# Patient Record
Sex: Male | Born: 1940 | Race: White | Hispanic: No | Smoking: Former smoker
Health system: Southern US, Community
[De-identification: ages and names within clinical notes are randomized; demographics above are authoritative.]

## PROBLEM LIST (undated history)

## (undated) DIAGNOSIS — E46 Unspecified protein-calorie malnutrition: Secondary | ICD-10-CM

## (undated) DIAGNOSIS — N183 Chronic kidney disease, stage 3 unspecified: Secondary | ICD-10-CM

## (undated) DIAGNOSIS — I5032 Chronic diastolic (congestive) heart failure: Secondary | ICD-10-CM

## (undated) DIAGNOSIS — K264 Chronic or unspecified duodenal ulcer with hemorrhage: Secondary | ICD-10-CM

## (undated) DIAGNOSIS — E119 Type 2 diabetes mellitus without complications: Secondary | ICD-10-CM

## (undated) DIAGNOSIS — K921 Melena: Secondary | ICD-10-CM

## (undated) DIAGNOSIS — I959 Hypotension, unspecified: Secondary | ICD-10-CM

## (undated) DIAGNOSIS — Z8719 Personal history of other diseases of the digestive system: Secondary | ICD-10-CM

## (undated) DIAGNOSIS — E8809 Other disorders of plasma-protein metabolism, not elsewhere classified: Secondary | ICD-10-CM

## (undated) DIAGNOSIS — E785 Hyperlipidemia, unspecified: Secondary | ICD-10-CM

## (undated) DIAGNOSIS — D72829 Elevated white blood cell count, unspecified: Secondary | ICD-10-CM

## (undated) DIAGNOSIS — D62 Acute posthemorrhagic anemia: Secondary | ICD-10-CM

## (undated) DIAGNOSIS — I10 Essential (primary) hypertension: Secondary | ICD-10-CM

## (undated) HISTORY — DX: Unspecified protein-calorie malnutrition: E46

## (undated) HISTORY — DX: Personal history of other diseases of the digestive system: Z87.19

## (undated) HISTORY — DX: Chronic kidney disease, stage 3 unspecified: N18.30

## (undated) HISTORY — DX: Elevated white blood cell count, unspecified: D72.829

## (undated) HISTORY — DX: Chronic or unspecified duodenal ulcer with hemorrhage: K26.4

## (undated) HISTORY — DX: Other disorders of plasma-protein metabolism, not elsewhere classified: E88.09

## (undated) HISTORY — DX: Acute posthemorrhagic anemia: D62

## (undated) HISTORY — DX: Melena: K92.1

## (undated) HISTORY — DX: Hypotension, unspecified: I95.9

## (undated) HISTORY — DX: Chronic diastolic (congestive) heart failure: I50.32

---

## 2014-08-23 DIAGNOSIS — J029 Acute pharyngitis, unspecified: Secondary | ICD-10-CM | POA: Diagnosis not present

## 2014-09-28 DIAGNOSIS — N400 Enlarged prostate without lower urinary tract symptoms: Secondary | ICD-10-CM | POA: Diagnosis not present

## 2014-09-28 DIAGNOSIS — E785 Hyperlipidemia, unspecified: Secondary | ICD-10-CM | POA: Diagnosis not present

## 2014-09-28 DIAGNOSIS — I10 Essential (primary) hypertension: Secondary | ICD-10-CM | POA: Diagnosis not present

## 2014-09-28 DIAGNOSIS — E119 Type 2 diabetes mellitus without complications: Secondary | ICD-10-CM | POA: Diagnosis not present

## 2014-12-27 DIAGNOSIS — H4011X1 Primary open-angle glaucoma, mild stage: Secondary | ICD-10-CM | POA: Diagnosis not present

## 2014-12-28 DIAGNOSIS — E119 Type 2 diabetes mellitus without complications: Secondary | ICD-10-CM | POA: Diagnosis not present

## 2014-12-28 DIAGNOSIS — Z6837 Body mass index (BMI) 37.0-37.9, adult: Secondary | ICD-10-CM | POA: Diagnosis not present

## 2014-12-28 DIAGNOSIS — I10 Essential (primary) hypertension: Secondary | ICD-10-CM | POA: Diagnosis not present

## 2014-12-28 DIAGNOSIS — E785 Hyperlipidemia, unspecified: Secondary | ICD-10-CM | POA: Diagnosis not present

## 2017-09-24 DIAGNOSIS — H401131 Primary open-angle glaucoma, bilateral, mild stage: Secondary | ICD-10-CM | POA: Diagnosis not present

## 2017-09-24 DIAGNOSIS — E119 Type 2 diabetes mellitus without complications: Secondary | ICD-10-CM | POA: Diagnosis not present

## 2017-09-24 DIAGNOSIS — H524 Presbyopia: Secondary | ICD-10-CM | POA: Diagnosis not present

## 2017-12-17 DIAGNOSIS — N183 Chronic kidney disease, stage 3 (moderate): Secondary | ICD-10-CM | POA: Diagnosis not present

## 2017-12-17 DIAGNOSIS — E1121 Type 2 diabetes mellitus with diabetic nephropathy: Secondary | ICD-10-CM | POA: Diagnosis not present

## 2017-12-17 DIAGNOSIS — M19011 Primary osteoarthritis, right shoulder: Secondary | ICD-10-CM | POA: Diagnosis not present

## 2017-12-17 DIAGNOSIS — I10 Essential (primary) hypertension: Secondary | ICD-10-CM | POA: Diagnosis not present

## 2017-12-17 DIAGNOSIS — E785 Hyperlipidemia, unspecified: Secondary | ICD-10-CM | POA: Diagnosis not present

## 2018-03-18 DIAGNOSIS — I10 Essential (primary) hypertension: Secondary | ICD-10-CM | POA: Diagnosis not present

## 2018-03-18 DIAGNOSIS — R609 Edema, unspecified: Secondary | ICD-10-CM | POA: Diagnosis not present

## 2018-03-18 DIAGNOSIS — E1121 Type 2 diabetes mellitus with diabetic nephropathy: Secondary | ICD-10-CM | POA: Diagnosis not present

## 2018-03-18 DIAGNOSIS — Z79899 Other long term (current) drug therapy: Secondary | ICD-10-CM | POA: Diagnosis not present

## 2018-03-18 DIAGNOSIS — E559 Vitamin D deficiency, unspecified: Secondary | ICD-10-CM | POA: Diagnosis not present

## 2018-03-18 DIAGNOSIS — Z1339 Encounter for screening examination for other mental health and behavioral disorders: Secondary | ICD-10-CM | POA: Diagnosis not present

## 2018-03-20 ENCOUNTER — Other Ambulatory Visit: Payer: Self-pay

## 2018-03-20 NOTE — Patient Outreach (Signed)
[facility]) Care Management  03/20/2018  Erik Morales [birth year] [id number]   Medication Adherence call to Erik Morales patients telephone number is disconnected under [facility] and Epic patient is showing past due on Pravastain 20 mg.Erik Morales is showing past due under [place].    [facility] Direct Dial [phone]  Fax [phone] [email or web address]

## 2018-03-25 DIAGNOSIS — H401131 Primary open-angle glaucoma, bilateral, mild stage: Secondary | ICD-10-CM | POA: Diagnosis not present

## 2018-06-25 DIAGNOSIS — I10 Essential (primary) hypertension: Secondary | ICD-10-CM | POA: Diagnosis not present

## 2018-06-25 DIAGNOSIS — Z2821 Immunization not carried out because of patient refusal: Secondary | ICD-10-CM | POA: Diagnosis not present

## 2018-06-25 DIAGNOSIS — Z139 Encounter for screening, unspecified: Secondary | ICD-10-CM | POA: Diagnosis not present

## 2018-06-25 DIAGNOSIS — E785 Hyperlipidemia, unspecified: Secondary | ICD-10-CM | POA: Diagnosis not present

## 2018-09-22 DIAGNOSIS — R946 Abnormal results of thyroid function studies: Secondary | ICD-10-CM | POA: Diagnosis not present

## 2018-09-22 DIAGNOSIS — E119 Type 2 diabetes mellitus without complications: Secondary | ICD-10-CM | POA: Diagnosis not present

## 2018-09-22 DIAGNOSIS — Z79899 Other long term (current) drug therapy: Secondary | ICD-10-CM | POA: Diagnosis not present

## 2018-09-22 DIAGNOSIS — R7989 Other specified abnormal findings of blood chemistry: Secondary | ICD-10-CM | POA: Diagnosis not present

## 2018-09-22 DIAGNOSIS — E11649 Type 2 diabetes mellitus with hypoglycemia without coma: Secondary | ICD-10-CM | POA: Diagnosis not present

## 2018-09-22 DIAGNOSIS — Z7984 Long term (current) use of oral hypoglycemic drugs: Secondary | ICD-10-CM | POA: Diagnosis not present

## 2018-09-22 DIAGNOSIS — T68XXXA Hypothermia, initial encounter: Secondary | ICD-10-CM | POA: Diagnosis not present

## 2018-09-22 DIAGNOSIS — E872 Acidosis: Secondary | ICD-10-CM | POA: Diagnosis not present

## 2018-09-22 DIAGNOSIS — Z87891 Personal history of nicotine dependence: Secondary | ICD-10-CM | POA: Diagnosis not present

## 2018-09-22 DIAGNOSIS — E8770 Fluid overload, unspecified: Secondary | ICD-10-CM | POA: Diagnosis not present

## 2018-09-22 DIAGNOSIS — R410 Disorientation, unspecified: Secondary | ICD-10-CM | POA: Diagnosis not present

## 2018-09-22 DIAGNOSIS — I4891 Unspecified atrial fibrillation: Secondary | ICD-10-CM | POA: Diagnosis not present

## 2018-09-22 DIAGNOSIS — Z9181 History of falling: Secondary | ICD-10-CM | POA: Diagnosis not present

## 2018-09-22 DIAGNOSIS — E041 Nontoxic single thyroid nodule: Secondary | ICD-10-CM | POA: Diagnosis not present

## 2018-09-22 DIAGNOSIS — E0590 Thyrotoxicosis, unspecified without thyrotoxic crisis or storm: Secondary | ICD-10-CM | POA: Diagnosis not present

## 2018-09-22 DIAGNOSIS — K921 Melena: Secondary | ICD-10-CM | POA: Diagnosis not present

## 2018-09-22 DIAGNOSIS — E875 Hyperkalemia: Secondary | ICD-10-CM | POA: Diagnosis not present

## 2018-09-22 DIAGNOSIS — I214 Non-ST elevation (NSTEMI) myocardial infarction: Secondary | ICD-10-CM | POA: Diagnosis not present

## 2018-09-22 DIAGNOSIS — K449 Diaphragmatic hernia without obstruction or gangrene: Secondary | ICD-10-CM | POA: Diagnosis not present

## 2018-09-22 DIAGNOSIS — B9620 Unspecified Escherichia coli [E. coli] as the cause of diseases classified elsewhere: Secondary | ICD-10-CM | POA: Diagnosis not present

## 2018-09-22 DIAGNOSIS — I4892 Unspecified atrial flutter: Secondary | ICD-10-CM | POA: Diagnosis not present

## 2018-09-22 DIAGNOSIS — N390 Urinary tract infection, site not specified: Secondary | ICD-10-CM | POA: Diagnosis not present

## 2018-09-22 DIAGNOSIS — N179 Acute kidney failure, unspecified: Secondary | ICD-10-CM | POA: Diagnosis not present

## 2018-09-22 DIAGNOSIS — A419 Sepsis, unspecified organism: Secondary | ICD-10-CM | POA: Diagnosis not present

## 2018-09-22 DIAGNOSIS — R0602 Shortness of breath: Secondary | ICD-10-CM | POA: Diagnosis not present

## 2018-09-22 DIAGNOSIS — R6520 Severe sepsis without septic shock: Secondary | ICD-10-CM | POA: Diagnosis not present

## 2018-09-22 DIAGNOSIS — K922 Gastrointestinal hemorrhage, unspecified: Secondary | ICD-10-CM | POA: Diagnosis not present

## 2018-09-22 DIAGNOSIS — I959 Hypotension, unspecified: Secondary | ICD-10-CM | POA: Diagnosis not present

## 2018-09-22 DIAGNOSIS — I10 Essential (primary) hypertension: Secondary | ICD-10-CM | POA: Diagnosis not present

## 2018-09-22 DIAGNOSIS — J189 Pneumonia, unspecified organism: Secondary | ICD-10-CM | POA: Diagnosis not present

## 2018-09-22 DIAGNOSIS — G92 Toxic encephalopathy: Secondary | ICD-10-CM | POA: Diagnosis not present

## 2018-09-22 DIAGNOSIS — E785 Hyperlipidemia, unspecified: Secondary | ICD-10-CM | POA: Diagnosis not present

## 2018-09-23 DIAGNOSIS — R7989 Other specified abnormal findings of blood chemistry: Secondary | ICD-10-CM

## 2018-09-23 DIAGNOSIS — I4891 Unspecified atrial fibrillation: Secondary | ICD-10-CM

## 2018-09-25 ENCOUNTER — Inpatient Hospital Stay (HOSPITAL_COMMUNITY)
Admission: AD | Admit: 2018-09-25 | Discharge: 2018-10-02 | DRG: 377 | Disposition: A | Discharge status: Rehabilitation Facility | Payer: Medicare Other | Source: Other Acute Inpatient Hospital | Attending: Internal Medicine | Admitting: Internal Medicine

## 2018-09-25 ENCOUNTER — Encounter (HOSPITAL_COMMUNITY): Payer: Self-pay | Admitting: Internal Medicine

## 2018-09-25 DIAGNOSIS — K264 Chronic or unspecified duodenal ulcer with hemorrhage: Principal | ICD-10-CM

## 2018-09-25 DIAGNOSIS — R946 Abnormal results of thyroid function studies: Secondary | ICD-10-CM

## 2018-09-25 DIAGNOSIS — N390 Urinary tract infection, site not specified: Secondary | ICD-10-CM

## 2018-09-25 DIAGNOSIS — N179 Acute kidney failure, unspecified: Secondary | ICD-10-CM

## 2018-09-25 DIAGNOSIS — E46 Unspecified protein-calorie malnutrition: Secondary | ICD-10-CM | POA: Diagnosis not present

## 2018-09-25 DIAGNOSIS — R7989 Other specified abnormal findings of blood chemistry: Secondary | ICD-10-CM | POA: Diagnosis present

## 2018-09-25 DIAGNOSIS — I10 Essential (primary) hypertension: Secondary | ICD-10-CM | POA: Diagnosis not present

## 2018-09-25 DIAGNOSIS — G3184 Mild cognitive impairment, so stated: Secondary | ICD-10-CM | POA: Diagnosis not present

## 2018-09-25 DIAGNOSIS — A419 Sepsis, unspecified organism: Secondary | ICD-10-CM | POA: Diagnosis present

## 2018-09-25 DIAGNOSIS — K921 Melena: Secondary | ICD-10-CM

## 2018-09-25 DIAGNOSIS — L8990 Pressure ulcer of unspecified site, unspecified stage: Secondary | ICD-10-CM | POA: Diagnosis present

## 2018-09-25 DIAGNOSIS — A4151 Sepsis due to Escherichia coli [E. coli]: Secondary | ICD-10-CM | POA: Diagnosis not present

## 2018-09-25 DIAGNOSIS — E669 Obesity, unspecified: Secondary | ICD-10-CM | POA: Diagnosis present

## 2018-09-25 DIAGNOSIS — J181 Lobar pneumonia, unspecified organism: Secondary | ICD-10-CM | POA: Diagnosis present

## 2018-09-25 DIAGNOSIS — Z6835 Body mass index (BMI) 35.0-35.9, adult: Secondary | ICD-10-CM | POA: Diagnosis not present

## 2018-09-25 DIAGNOSIS — Z833 Family history of diabetes mellitus: Secondary | ICD-10-CM | POA: Diagnosis not present

## 2018-09-25 DIAGNOSIS — B9620 Unspecified Escherichia coli [E. coli] as the cause of diseases classified elsewhere: Secondary | ICD-10-CM

## 2018-09-25 DIAGNOSIS — E1169 Type 2 diabetes mellitus with other specified complication: Secondary | ICD-10-CM | POA: Diagnosis not present

## 2018-09-25 DIAGNOSIS — I480 Paroxysmal atrial fibrillation: Secondary | ICD-10-CM | POA: Diagnosis not present

## 2018-09-25 DIAGNOSIS — Z87891 Personal history of nicotine dependence: Secondary | ICD-10-CM | POA: Diagnosis not present

## 2018-09-25 DIAGNOSIS — I5032 Chronic diastolic (congestive) heart failure: Secondary | ICD-10-CM | POA: Diagnosis present

## 2018-09-25 DIAGNOSIS — I248 Other forms of acute ischemic heart disease: Secondary | ICD-10-CM | POA: Diagnosis present

## 2018-09-25 DIAGNOSIS — E119 Type 2 diabetes mellitus without complications: Secondary | ICD-10-CM

## 2018-09-25 DIAGNOSIS — I4891 Unspecified atrial fibrillation: Secondary | ICD-10-CM | POA: Diagnosis not present

## 2018-09-25 DIAGNOSIS — Z79899 Other long term (current) drug therapy: Secondary | ICD-10-CM | POA: Diagnosis not present

## 2018-09-25 DIAGNOSIS — Z7984 Long term (current) use of oral hypoglycemic drugs: Secondary | ICD-10-CM

## 2018-09-25 DIAGNOSIS — K222 Esophageal obstruction: Secondary | ICD-10-CM | POA: Diagnosis present

## 2018-09-25 DIAGNOSIS — R5381 Other malaise: Secondary | ICD-10-CM | POA: Diagnosis not present

## 2018-09-25 DIAGNOSIS — I214 Non-ST elevation (NSTEMI) myocardial infarction: Secondary | ICD-10-CM | POA: Diagnosis not present

## 2018-09-25 DIAGNOSIS — R6520 Severe sepsis without septic shock: Secondary | ICD-10-CM

## 2018-09-25 DIAGNOSIS — K449 Diaphragmatic hernia without obstruction or gangrene: Secondary | ICD-10-CM | POA: Diagnosis not present

## 2018-09-25 DIAGNOSIS — I110 Hypertensive heart disease with heart failure: Secondary | ICD-10-CM | POA: Diagnosis present

## 2018-09-25 DIAGNOSIS — D509 Iron deficiency anemia, unspecified: Secondary | ICD-10-CM | POA: Diagnosis not present

## 2018-09-25 DIAGNOSIS — E1122 Type 2 diabetes mellitus with diabetic chronic kidney disease: Secondary | ICD-10-CM | POA: Diagnosis not present

## 2018-09-25 DIAGNOSIS — D62 Acute posthemorrhagic anemia: Secondary | ICD-10-CM

## 2018-09-25 DIAGNOSIS — E8809 Other disorders of plasma-protein metabolism, not elsewhere classified: Secondary | ICD-10-CM | POA: Diagnosis not present

## 2018-09-25 DIAGNOSIS — J189 Pneumonia, unspecified organism: Secondary | ICD-10-CM

## 2018-09-25 DIAGNOSIS — I4892 Unspecified atrial flutter: Secondary | ICD-10-CM | POA: Diagnosis not present

## 2018-09-25 DIAGNOSIS — D72829 Elevated white blood cell count, unspecified: Secondary | ICD-10-CM

## 2018-09-25 DIAGNOSIS — G92 Toxic encephalopathy: Secondary | ICD-10-CM | POA: Diagnosis not present

## 2018-09-25 DIAGNOSIS — R778 Other specified abnormalities of plasma proteins: Secondary | ICD-10-CM

## 2018-09-25 DIAGNOSIS — I130 Hypertensive heart and chronic kidney disease with heart failure and stage 1 through stage 4 chronic kidney disease, or unspecified chronic kidney disease: Secondary | ICD-10-CM | POA: Diagnosis not present

## 2018-09-25 DIAGNOSIS — Z720 Tobacco use: Secondary | ICD-10-CM

## 2018-09-25 DIAGNOSIS — R7309 Other abnormal glucose: Secondary | ICD-10-CM | POA: Diagnosis not present

## 2018-09-25 DIAGNOSIS — Z8719 Personal history of other diseases of the digestive system: Secondary | ICD-10-CM | POA: Diagnosis not present

## 2018-09-25 DIAGNOSIS — I959 Hypotension, unspecified: Secondary | ICD-10-CM | POA: Diagnosis not present

## 2018-09-25 DIAGNOSIS — E785 Hyperlipidemia, unspecified: Secondary | ICD-10-CM | POA: Diagnosis present

## 2018-09-25 DIAGNOSIS — N183 Chronic kidney disease, stage 3 (moderate): Secondary | ICD-10-CM | POA: Diagnosis not present

## 2018-09-25 DIAGNOSIS — Z09 Encounter for follow-up examination after completed treatment for conditions other than malignant neoplasm: Secondary | ICD-10-CM | POA: Diagnosis not present

## 2018-09-25 HISTORY — DX: Type 2 diabetes mellitus without complications: E11.9

## 2018-09-25 HISTORY — DX: Sepsis, unspecified organism: A41.9

## 2018-09-25 HISTORY — DX: Other specified abnormal findings of blood chemistry: R79.89

## 2018-09-25 HISTORY — DX: Severe sepsis without septic shock: R65.20

## 2018-09-25 HISTORY — DX: Melena: K92.1

## 2018-09-25 HISTORY — DX: Urinary tract infection, site not specified: B96.20

## 2018-09-25 HISTORY — DX: Essential (primary) hypertension: I10

## 2018-09-25 HISTORY — DX: Pneumonia, unspecified organism: J18.9

## 2018-09-25 HISTORY — DX: Acute kidney failure, unspecified: N17.9

## 2018-09-25 HISTORY — DX: Unspecified Escherichia coli (E. coli) as the cause of diseases classified elsewhere: N39.0

## 2018-09-25 HISTORY — DX: Hyperlipidemia, unspecified: E78.5

## 2018-09-25 HISTORY — DX: Abnormal results of thyroid function studies: R94.6

## 2018-09-25 HISTORY — DX: Other specified abnormalities of plasma proteins: R77.8

## 2018-09-25 LAB — APTT: aPTT: 35 seconds (ref 24–36)

## 2018-09-25 LAB — COMPREHENSIVE METABOLIC PANEL
ALT: 25 U/L (ref 0–44)
AST: 56 U/L — ABNORMAL HIGH (ref 15–41)
Albumin: 1.9 g/dL — ABNORMAL LOW (ref 3.5–5.0)
Alkaline Phosphatase: 35 U/L — ABNORMAL LOW (ref 38–126)
Anion gap: 7 (ref 5–15)
BUN: 56 mg/dL — ABNORMAL HIGH (ref 8–23)
CO2: 24 mmol/L (ref 22–32)
Calcium: 7.5 mg/dL — ABNORMAL LOW (ref 8.9–10.3)
Chloride: 111 mmol/L (ref 98–111)
Creatinine, Ser: 1.86 mg/dL — ABNORMAL HIGH (ref 0.61–1.24)
GFR calc non Af Amer: 34 mL/min — ABNORMAL LOW (ref >60)
GFR, EST AFRICAN AMERICAN: 39 mL/min — AB (ref >60)
Glucose, Bld: 247 mg/dL — ABNORMAL HIGH (ref 70–99)
POTASSIUM: 4.2 mmol/L (ref 3.5–5.1)
Sodium: 142 mmol/L (ref 135–145)
Total Bilirubin: 0.4 mg/dL (ref 0.3–1.2)
Total Protein: 4.5 g/dL — ABNORMAL LOW (ref 6.5–8.1)

## 2018-09-25 LAB — CBC
HCT: 25.4 % — ABNORMAL LOW (ref 39.0–52.0)
Hemoglobin: 8.2 g/dL — ABNORMAL LOW (ref 13.0–17.0)
MCH: 28.3 pg (ref 26.0–34.0)
MCHC: 32.3 g/dL (ref 30.0–36.0)
MCV: 87.6 fL (ref 80.0–100.0)
Platelets: 195 10*3/uL (ref 150–400)
RBC: 2.9 MIL/uL — ABNORMAL LOW (ref 4.22–5.81)
RDW: 14.4 % (ref 11.5–15.5)
WBC: 24.8 10*3/uL — ABNORMAL HIGH (ref 4.0–10.5)
nRBC: 1 % — ABNORMAL HIGH (ref 0.0–0.2)

## 2018-09-25 LAB — PROTIME-INR
INR: 1.42
Prothrombin Time: 17.2 seconds — ABNORMAL HIGH (ref 11.4–15.2)

## 2018-09-25 LAB — TROPONIN I: Troponin I: 15.74 ng/mL (ref <0.03)

## 2018-09-25 MED ORDER — ONDANSETRON HCL 4 MG/2ML IJ SOLN: 4.0000 mg | Freq: Four times a day (QID) | INTRAMUSCULAR | Status: DC | PRN

## 2018-09-25 MED ORDER — SODIUM CHLORIDE 0.9 % IV SOLN
8.0000 mg/h | INTRAVENOUS | Status: DC
  Administered 2018-09-26 – 2018-09-28 (×6): 8 mg/h via INTRAVENOUS
  Filled 2018-09-25 (×8): qty 80

## 2018-09-25 MED ORDER — SODIUM CHLORIDE 0.9 % IV SOLN
500.0000 mg | INTRAVENOUS | Status: DC
  Administered 2018-09-26 – 2018-09-27 (×3): 500 mg via INTRAVENOUS
  Filled 2018-09-25 (×4): qty 500

## 2018-09-25 MED ORDER — ACETAMINOPHEN 325 MG PO TABS: 650.0000 mg | ORAL_TABLET | Freq: Four times a day (QID) | ORAL | Status: DC | PRN

## 2018-09-25 MED ORDER — SODIUM CHLORIDE 0.9 % IV SOLN
2.0000 g | INTRAVENOUS | Status: DC
  Administered 2018-09-26 – 2018-09-27 (×3): 2 g via INTRAVENOUS
  Filled 2018-09-25 (×4): qty 20

## 2018-09-25 MED ORDER — ONDANSETRON HCL 4 MG PO TABS: 4.0000 mg | ORAL_TABLET | Freq: Four times a day (QID) | ORAL | Status: DC | PRN

## 2018-09-25 MED ORDER — AMIODARONE HCL IN DEXTROSE 360-4.14 MG/200ML-% IV SOLN
30.0000 mg/h | INTRAVENOUS | Status: DC
  Administered 2018-09-26 – 2018-09-28 (×5): 30 mg/h via INTRAVENOUS
  Filled 2018-09-25 (×7): qty 200

## 2018-09-25 MED ORDER — ACETAMINOPHEN 650 MG RE SUPP: 650.0000 mg | Freq: Four times a day (QID) | RECTAL | Status: DC | PRN

## 2018-09-25 MED ORDER — INSULIN ASPART 100 UNIT/ML ~~LOC~~ SOLN
0.0000 [IU] | SUBCUTANEOUS | Status: DC
  Administered 2018-09-26 (×2): 2 [IU] via SUBCUTANEOUS
  Administered 2018-09-26: 3 [IU] via SUBCUTANEOUS
  Administered 2018-09-26: 5 [IU] via SUBCUTANEOUS
  Administered 2018-09-26 (×2): 2 [IU] via SUBCUTANEOUS
  Administered 2018-09-27: 1 [IU] via SUBCUTANEOUS
  Administered 2018-09-27 (×4): 2 [IU] via SUBCUTANEOUS
  Administered 2018-09-28: 1 [IU] via SUBCUTANEOUS
  Administered 2018-09-28: 2 [IU] via SUBCUTANEOUS
  Administered 2018-09-28 (×2): 1 [IU] via SUBCUTANEOUS
  Administered 2018-09-29: 2 [IU] via SUBCUTANEOUS
  Administered 2018-09-29: 1 [IU] via SUBCUTANEOUS
  Administered 2018-09-30: 2 [IU] via SUBCUTANEOUS
  Administered 2018-09-30 – 2018-10-02 (×5): 1 [IU] via SUBCUTANEOUS

## 2018-09-25 MED ORDER — PANTOPRAZOLE SODIUM 40 MG IV SOLR: 40.0000 mg | Freq: Two times a day (BID) | INTRAVENOUS | Status: DC

## 2018-09-25 NOTE — H&P (Signed)
History and Physical    Erik Morales TDV:[id number] DOB: 1941-03-20 DOA: 09/25/2018  PCP: Patient, No Pcp Per  Patient coming from: [place] transfer  I have personally briefly reviewed patient's old medical records in [place]  Chief Complaint: Hematochezia  HPI: Erik Morales is a 78 y.o. male with medical history significant of diabetes, not on insulin, hypertension on Lasix and ACE inhibitor, presents to the ER for altered mental status. Apparently patient was found in the ditch by the wife, has been there for unknown period of time and sent to the ER. Patient reports that " his riding lawnmower was in the ditch and he called the sheriff department to help him get it out", but he states he was not acting right so they brought him to the ER. Patient complains of mild dysuria, at time of admission denies any nausea, vomiting, chest pain, fever, chills, headache, numbness tingling, focal weakness, hematemesis, hematochezia.   In the ER, initial temperature 92.7, blood pressure in low 100, on room air saturating well. Blood workup showed elevated creatinine, leukocytosis, lactic acidosis, . chest x-ray was unremarkable. Urinalysis showed WBC 10-20 in with negative nitrite, negative leukocyte esterase, with hyaline cast, negative protein.Patient was given 2 L normal saline bolus, ceftriaxone. His CT head was unremarkable.  Hospitalist was consulted to admit for further management for his acute kidney injury, severe sepsis.  Subsequent patient was admitted for altered mental status secondary to sepsis, source of sepsis thought to be secondary to urine tract infection/ possible pneumonia. Patient was aggressively treated with IV fluids IV antibiotics (2 g Rocephin and 500 mg azithromycin q.day) Blood cultures has been negative to date, urine culture revealed E coli, pansensitive.   From the infection standpoint patient continued to improve.  Patient was noted to have elevated troponins,  Cardiology was consulted, feeling was that this represented demand ischemia, no significant findings on 2D echocardiogram. Patient has also developed new onset AFib with RVR.  Due to A.Fib and NSTEMI, patient was started on heparin, this was subsequently switched to p.o. Eliquis earlier this morning of 09/25/2018.  Later today 09/25/2018 he had a large bloody bowel movement. Hemoglobin recheck showed that it had droped from 8.12 >> 5.7, patient was hypotensive with a blood pressure of 84/63. Stat 2 unit blood transfusion was ordered, along with IV fluid resuscitation.   All blood thinners were DC, patient started on Protonix, restarted gentle IV fluid hydration. Patient has been kept NPO  Since no GI coverage at [facility], called [facility] at [facility], they have agreed to see the patient.   Patient was transferred to [facility].  Since arrival has had only 1 small bloody BM, maroon colored stool.  Patient states he feels "okay" at the moment.  Denies abd pain, chest pain, SOB, nausea.  Review of Systems: As per HPI otherwise 10 point review of systems negative.   Past Medical History:  Diagnosis Date  . DM2 (diabetes mellitus, type 2) ([place])   . HLD (hyperlipidemia)   . HTN (hypertension)     History reviewed. No pertinent surgical history.   reports that he has quit smoking. He does not have any smokeless tobacco history on file. He reports that he does not drink alcohol or use drugs.  No Known Allergies  No family history on file. Patient denies FHx of GIB, stomach ulcers, or colon cancer, again though not oriented to time  Prior to Admission medications   Not on File    Physical Exam:  Vitals:   09/25/18 2132 09/25/18 2139  BP:  108/70  Pulse:  95  Temp:  98.7 F (37.1 C)  TempSrc:  Oral  SpO2:  99%  Height: 5\' 10"  (1.778 m)     Constitutional: NAD, calm, comfortable, Pale appearing Eyes: PERRL, lids and conjunctivae normal ENMT: Mucous membranes are moist.  Posterior pharynx clear of any exudate or lesions.Normal dentition.  Neck: normal, supple, no masses, no thyromegaly Respiratory: clear to auscultation bilaterally, no wheezing, no crackles. Normal respiratory effort. No accessory muscle use.  Cardiovascular: Regular rate and rhythm, no murmurs / rubs / gallops. No extremity edema. 2+ pedal pulses. No carotid bruits.  Abdomen: no tenderness, no masses palpated. No hepatosplenomegaly. Bowel sounds positive.  Musculoskeletal: no clubbing / cyanosis. No joint deformity upper and lower extremities. Good ROM, no contractures. Normal muscle tone.  Skin: no rashes, lesions, ulcers. No induration Neurologic: CN 2-12 grossly intact. Sensation intact, DTR normal. Strength 5/5 in all 4.  Psychiatric: Pleasant, but confused, "Ill be 78 years old in April".  Oriented to self, and that he was born in 1942, but timing is a bit off.   Labs on Admission: I have personally reviewed following labs and imaging studies  CBC: No results for input(s): WBC, NEUTROABS, HGB, HCT, MCV, PLT in the last 168 hours. Basic Metabolic Panel: No results for input(s): NA, K, CL, CO2, GLUCOSE, BUN, CREATININE, CALCIUM, MG, PHOS in the last 168 hours. GFR: CrCl cannot be calculated (No successful lab value found.). Liver Function Tests: No results for input(s): AST, ALT, ALKPHOS, BILITOT, PROT, ALBUMIN in the last 168 hours. No results for input(s): LIPASE, AMYLASE in the last 168 hours. No results for input(s): AMMONIA in the last 168 hours. Coagulation Profile: No results for input(s): INR, PROTIME in the last 168 hours. Cardiac Enzymes: No results for input(s): CKTOTAL, CKMB, CKMBINDEX, TROPONINI in the last 168 hours. BNP (last 3 results) No results for input(s): PROBNP in the last 8760 hours. HbA1C: No results for input(s): HGBA1C in the last 72 hours. CBG: No results for input(s): GLUCAP in the last 168 hours. Lipid Profile: No results for input(s): CHOL, HDL,  LDLCALC, TRIG, CHOLHDL, LDLDIRECT in the last 72 hours. Thyroid Function Tests: No results for input(s): TSH, T4TOTAL, FREET4, T3FREE, THYROIDAB in the last 72 hours. Anemia Panel: No results for input(s): VITAMINB12, FOLATE, FERRITIN, TIBC, IRON, RETICCTPCT in the last 72 hours. Urine analysis: No results found for: COLORURINE, APPEARANCEUR, LABSPEC, PHURINE, GLUCOSEU, HGBUR, BILIRUBINUR, KETONESUR, PROTEINUR, UROBILINOGEN, NITRITE, LEUKOCYTESUR  Radiological Exams on Admission: No results found.  EKG: Independently reviewed.  Assessment/Plan Principal Problem:   Hematochezia Active Problems:   AKI (acute kidney injury) ([place])   Severe sepsis (HCC)   Atrial fibrillation with RVR (HCC)   Elevated troponin   Abnormal thyroid function test   DM2 (diabetes mellitus, type 2) (HCC)   HTN (hypertension)   E-coli UTI   CAP (community acquired pneumonia)    1. Hematochezia - diverticular bleed? 1. Repeating CBC now and again at 0500 in AM 2. Call GI in AM, sooner if patient becomes unstable 3. Vitals look okay at the moment 4. Type and screen, further transfusion depending on CBC results 5. Checking INR / Aptt 6. If further major bleeding tonight: 1. Likely would warrant Tagged RBC scan 2. Depending on INR / PTT may warrant KCentra reversal, but it sounds like eliquis was 12h ago now. 7. No N/V, hemoptysis, maroon stool but no melena 8. But will keep patient on  PPI gtt for the moment 2. Severe sepsis - secondary to UTI and CAP 1. UCx grew E.Coli, pan sensitive 2. BCx neg to date 3. Influenza Neg 4. Continue rocephin / azithro for now 5. Patient has been improving from the sepsis standpoint during admission at [facility] 3. A.Fib RVR - 1. Now on Amiodarone gtt, HR 95 at the moment (unsure if still A.Fib or not as hes not yet on monitor). 2. Will continue amiodarone for now 3. Anticoagulation stopped today secondary to acute GI bleed (had been on heparin which was switched to eliquis  earlier today prior to having GI bleed) 4. Elevated troponin - 1. Serial trops 2. Had been trending down at [facility] 3. Call Cards in AM (had been seeing at [facility]) 4. No CP 5. EF on echo was WNL 6. Patient needs lexiscan, eventually after stable 5. NIDDM - 1. Sensitive scale SSI Q4H 2. A1C = 5.9 6. HTN - holding HTN meds as he was hypotensive earlier today, BP remains borderline at 108/70 currently. 7. Slightly elevated T4  DVT prophylaxis: SCDs Code Status: Full Family Communication: No family in room Disposition Plan: Home after admit Consults called: None, call GI and Cards in AM Admission status: Admit to inpatient  Severity of Illness: The appropriate patient status for this patient is INPATIENT. Inpatient status is judged to be reasonable and necessary in order to provide the required intensity of service to ensure the patient's safety. The patient's presenting symptoms, physical exam findings, and initial radiographic and laboratory data in the context of their chronic comorbidities is felt to place them at high risk for further clinical deterioration. Furthermore, it is not anticipated that the patient will be medically stable for discharge from the hospital within 2 midnights of admission. The following factors support the patient status of inpatient.   " The patient's presenting symptoms include Hematochezia severe HGB drop, Elevated troponin, pneumonia, UTI, AMS with delirium, see note above.   * I certify that at the point of admission it is my clinical judgment that the patient will require inpatient hospital care spanning beyond 2 midnights from the point of admission due to high intensity of service, high risk for further deterioration and high frequency of surveillance required.*    Ifrah Vest M. DO [facility]  How to contact the [facility] Attending or Consulting provider [place] or covering provider during after hours [place], for this patient?  1. Check the care team  in [facility] and look for a) attending/consulting TRH provider listed and b) the [facility] team listed 2. Log into [email or web address]  Amion Physician Scheduling and messaging for groups and whole hospitals  On call and physician scheduling software for group practices, residents, hospitalists and other medical providers for call, clinic, rotation and shift schedules. OnCall Enterprise is a hospital-wide system for scheduling doctors and paging doctors on call. EasyPlot is for scientific plotting and data analysis.  [email or web address]  and use 's universal password to access. If you do not have the password, please contact the hospital operator.  3. Locate the [facility] provider you are looking for under [facility] and page to a number that you can be directly reached. 4. If you still have difficulty reaching the provider, please page the [facility] (Director on Call) for the Hospitalists listed on amion for assistance.  09/25/2018, 10:23 PM

## 2018-09-25 NOTE — Progress Notes (Signed)
  Amiodarone Drug - Drug Interaction Consult Note  Recommendations: Monitor closely for QT prolongation  Amiodarone is metabolized by the cytochrome P450 system and therefore has the potential to cause many drug interactions. Amiodarone has an average plasma half-life of 50 days (range 20 to 100 days).   There is potential for drug interactions to occur several weeks or months after stopping treatment and the onset of drug interactions may be slow after initiating amiodarone.    [x]  Drugs that prolong the QT interval:  Torsades de pointes risk may be increased with concurrent use - avoid if possible.  Monitor QTc, also keep magnesium/potassium WNL if concurrent therapy can't be avoided. [place] Kitchen Antibiotics: e.g. fluoroquinolones, erythromycin, azithromycin   Pharmacy also asked to continue IV amiodarone from [place]. Pt is currently on 30mg /hr - will enter order for this rate.  Thank You,  Sherlon Handing, PharmD, BCPS Clinical pharmacist  **Pharmacist phone directory can now be found on [email or web address] ([email or web address]).  Listed under [place]. 09/25/2018 10:55 PM

## 2018-09-26 ENCOUNTER — Inpatient Hospital Stay (HOSPITAL_COMMUNITY): Payer: Medicare Other

## 2018-09-26 ENCOUNTER — Ambulatory Visit (HOSPITAL_COMMUNITY): Payer: Medicare Other

## 2018-09-26 ENCOUNTER — Encounter (HOSPITAL_COMMUNITY): Payer: Self-pay | Admitting: Physician Assistant

## 2018-09-26 DIAGNOSIS — N390 Urinary tract infection, site not specified: Secondary | ICD-10-CM

## 2018-09-26 DIAGNOSIS — L8990 Pressure ulcer of unspecified site, unspecified stage: Secondary | ICD-10-CM | POA: Diagnosis present

## 2018-09-26 DIAGNOSIS — B9620 Unspecified Escherichia coli [E. coli] as the cause of diseases classified elsewhere: Secondary | ICD-10-CM

## 2018-09-26 DIAGNOSIS — J189 Pneumonia, unspecified organism: Secondary | ICD-10-CM

## 2018-09-26 DIAGNOSIS — I4891 Unspecified atrial fibrillation: Secondary | ICD-10-CM

## 2018-09-26 HISTORY — DX: Pressure ulcer of unspecified site, unspecified stage: L89.90

## 2018-09-26 LAB — BASIC METABOLIC PANEL
Anion gap: 8 (ref 5–15)
BUN: 63 mg/dL — ABNORMAL HIGH (ref 8–23)
CO2: 22 mmol/L (ref 22–32)
Calcium: 7.6 mg/dL — ABNORMAL LOW (ref 8.9–10.3)
Chloride: 112 mmol/L — ABNORMAL HIGH (ref 98–111)
Creatinine, Ser: 2.04 mg/dL — ABNORMAL HIGH (ref 0.61–1.24)
GFR calc Af Amer: 35 mL/min — ABNORMAL LOW (ref >60)
GFR calc non Af Amer: 30 mL/min — ABNORMAL LOW (ref >60)
Glucose, Bld: 244 mg/dL — ABNORMAL HIGH (ref 70–99)
Potassium: 4.7 mmol/L (ref 3.5–5.1)
Sodium: 142 mmol/L (ref 135–145)

## 2018-09-26 LAB — HEMOGLOBIN AND HEMATOCRIT, BLOOD
HEMATOCRIT: 24.1 % — AB (ref 39.0–52.0)
Hemoglobin: 8.2 g/dL — ABNORMAL LOW (ref 13.0–17.0)

## 2018-09-26 LAB — ABO/RH: ABO/RH(D): A POS

## 2018-09-26 LAB — URINALYSIS, ROUTINE W REFLEX MICROSCOPIC
Bilirubin Urine: NEGATIVE
GLUCOSE, UA: NEGATIVE mg/dL
Ketones, ur: NEGATIVE mg/dL
Leukocytes, UA: NEGATIVE
NITRITE: NEGATIVE
Protein, ur: NEGATIVE mg/dL
Specific Gravity, Urine: 1.015 (ref 1.005–1.030)
pH: 5 (ref 5.0–8.0)

## 2018-09-26 LAB — ECHOCARDIOGRAM COMPLETE
Height: 70 in
WEIGHTICAEL: 3950.64 [oz_av]

## 2018-09-26 LAB — CBC
HEMATOCRIT: 21.4 % — AB (ref 39.0–52.0)
Hemoglobin: 7.1 g/dL — ABNORMAL LOW (ref 13.0–17.0)
MCH: 29.3 pg (ref 26.0–34.0)
MCHC: 33.2 g/dL (ref 30.0–36.0)
MCV: 88.4 fL (ref 80.0–100.0)
Platelets: 211 10*3/uL (ref 150–400)
RBC: 2.42 MIL/uL — ABNORMAL LOW (ref 4.22–5.81)
RDW: 15 % (ref 11.5–15.5)
WBC: 29.5 10*3/uL — ABNORMAL HIGH (ref 4.0–10.5)
nRBC: 1.1 % — ABNORMAL HIGH (ref 0.0–0.2)

## 2018-09-26 LAB — TROPONIN I: Troponin I: 10.23 ng/mL

## 2018-09-26 LAB — TSH: TSH: 0.43 u[IU]/mL (ref 0.350–4.500)

## 2018-09-26 LAB — GLUCOSE, CAPILLARY
GLUCOSE-CAPILLARY: 160 mg/dL — AB (ref 70–99)
Glucose-Capillary: 171 mg/dL — ABNORMAL HIGH (ref 70–99)
Glucose-Capillary: 183 mg/dL — ABNORMAL HIGH (ref 70–99)
Glucose-Capillary: 190 mg/dL — ABNORMAL HIGH (ref 70–99)
Glucose-Capillary: 244 mg/dL — ABNORMAL HIGH (ref 70–99)
Glucose-Capillary: 274 mg/dL — ABNORMAL HIGH (ref 70–99)

## 2018-09-26 LAB — PREPARE RBC (CROSSMATCH)

## 2018-09-26 LAB — MAGNESIUM: MAGNESIUM: 1.8 mg/dL (ref 1.7–2.4)

## 2018-09-26 MED ORDER — SODIUM CHLORIDE 0.9% IV SOLUTION: Freq: Once | INTRAVENOUS | Status: DC

## 2018-09-26 MED ORDER — AMIODARONE LOAD VIA INFUSION
150.0000 mg | Freq: Once | INTRAVENOUS | Status: AC
  Administered 2018-09-26: 150 mg via INTRAVENOUS
  Filled 2018-09-26: qty 83.34

## 2018-09-26 MED ORDER — LACTATED RINGERS IV SOLN
INTRAVENOUS | Status: DC
  Administered 2018-09-26 (×2): via INTRAVENOUS

## 2018-09-26 MED ORDER — LACTATED RINGERS IV SOLN
INTRAVENOUS | Status: AC
  Administered 2018-09-26: 11:00:00 via INTRAVENOUS

## 2018-09-26 MED ORDER — SODIUM CHLORIDE 0.9 % IV SOLN
INTRAVENOUS | Status: DC
  Administered 2018-09-26: 05:00:00 via INTRAVENOUS

## 2018-09-26 MED ORDER — AMIODARONE IV BOLUS ONLY 150 MG/100ML: 150.0000 mg | Freq: Once | INTRAVENOUS | Status: DC

## 2018-09-26 MED ORDER — PROTHROMBIN COMPLEX CONC HUMAN 500 UNITS IV KIT
4269.0000 [IU] | PACK | Status: AC
  Administered 2018-09-26: 4269 [IU] via INTRAVENOUS
  Filled 2018-09-26: qty 4269

## 2018-09-26 MED ORDER — HALOPERIDOL LACTATE 5 MG/ML IJ SOLN
5.0000 mg | Freq: Once | INTRAMUSCULAR | Status: AC
  Administered 2018-09-26: 5 mg via INTRAVENOUS
  Filled 2018-09-26: qty 1

## 2018-09-26 MED ORDER — SODIUM CHLORIDE 0.9 % IV BOLUS
500.0000 mL | Freq: Once | INTRAVENOUS | Status: AC
  Administered 2018-09-26: 500 mL via INTRAVENOUS

## 2018-09-26 MED ORDER — PERFLUTREN LIPID MICROSPHERE
1.0000 mL | INTRAVENOUS | Status: AC | PRN
  Administered 2018-09-26: 9 mL via INTRAVENOUS
  Filled 2018-09-26: qty 10

## 2018-09-26 NOTE — Progress Notes (Signed)
Patient still bleeding with melena in rectal tube.  BP 95/50, HR 105  He is alert, does not have CP, dyspnea, or abdominal pain or tenderness.  Labs including hemoglobin are being drawn now.  I will leave transfusion parameters.  I also spoke with his granddaughter Maddoxx Burkitt about an EGD tomorrow. Patient with altered mental status and cannot give consent.  Home number listed in chart is not in service, and Janett Billow was listed as NOK.  She is agreeable to EGD tomorrow, and will be here early AM to sign consent and be present while Mehtaab has his procedure.  Supportive care meantime.  500 cc saline bolus ordered.  Nursing updated.

## 2018-09-26 NOTE — Progress Notes (Signed)
On assessment to administer blood products patient SBP was 70-80. Cardiology PA Dunn in room and aware. MD Candiss Norse also made aware, goal at this time to keep SBP>90 with fluids and blood.  Patient denies pain, dizziness or shortness of breath at this time. Will continue to monitor closely. Family also at bedside and updated.

## 2018-09-26 NOTE — Consult Note (Addendum)
[facility] Consult Note   History KAISON [place]ARLAND MRN # [id number]  Date of Admission: 09/25/2018 Date of Consultation: 09/26/2018 Referring physician: Dr. Thurnell Lose, MD Primary Care Provider: Patient, No Pcp Per Primary Gastroenterologist: None   Reason for Consultation/Chief Complaint: Hematochezia  Subjective  HPI:  This is a 78 year old man transferred last evening from [facility] due to lack of GI coverage the setting of multiple acute illnesses complicated by GI bleeding. He was admitted to [facility] within the last few days, brought by family when they found him outside with his lawnmower turned over in a ditch for an unknown period of time.  He had altered mental status, was hypothermic and with a sepsis picture.  He has E. coli UTI and reportedly pneumonia as well, on Rocephin and azithromycin.  His sepsis picture was apparently improving, he had NSTEMI with significant elevation of troponin thought to be demand from his acute illness.  He then developed rapid A. fib and was put on amiodarone drip.  He was started on IV heparin and then received a dose of Eliquis about 24 hours ago.  Later that day he had a large bloody bowel movement with a drop of hemoglobin from 8-5.7.  He was hypotensive, received 2 units of PRBCs and IV fluids.  He was then transferred here overnight.  Admission history and physical and progress note from hospitalist have both been reviewed.  I also spoke with the hospitalist Dr. Candiss Norse who is picking him up today.  Early morning labs found the patient's hemoglobin to have dropped back down to 7 after transfusion, he has been ordered for 2 units of PRBCs, blood bank is been contacted to release emergently.  There were also orders placed for a tagged RBC scan.  Patient is confused and cannot give any helpful history or review of systems.  His family is not currently here.  ROS:  Unable to completely obtain as noted above.  While  he is awake and conversational, he is disoriented.  He denies abdominal pain or chest pain or dizziness.   Past Medical History Past Medical History:  Diagnosis Date  . DM2 (diabetes mellitus, type 2) ([place])   . HLD (hyperlipidemia)   . HTN (hypertension)     Past Surgical History History reviewed. No pertinent surgical history. No known surgical history  Family History No family history on file. Can Not obtain mental status Social History Social History   Socioeconomic History  . Marital status: Unknown    Spouse name: Not on file  . Number of children: Not on file  . Years of education: Not on file  . Highest education level: Not on file  Occupational History  . Not on file  Social Needs  . Financial resource strain: Not on file  . Food insecurity:    Worry: Not on file    Inability: Not on file  . Transportation needs:    Medical: Not on file    Non-medical: Not on file  Tobacco Use  . Smoking status: Former Smoker  Substance and Sexual Activity  . Alcohol use: Never    Frequency: Never  . Drug use: Never  . Sexual activity: Not on file  Lifestyle  . Physical activity:    Days per week: Not on file    Minutes per session: Not on file  . Stress: Not on file  Relationships  . Social connections:    Talks on phone: Not on file    Gets  together: Not on file    Attends religious service: Not on file    Active member of club or organization: Not on file    Attends meetings of clubs or organizations: Not on file    Relationship status: Not on file  Other Topics Concern  . Not on file  Social History Narrative  . Not on file   He reportedly lives at home with his wife. Allergies No Known Allergies  Outpatient Meds Home medications from the H+P and/or nursing med reconciliation reviewed.  Inpatient med list reviewed  _____________________________________________________________________ Objective   Exam:  Current vital signs  Patient Vitals for  the past 8 hrs:  BP Pulse Resp  09/26/18 0525 (!) 102/58 93 18    Intake/Output Summary (Last 24 hours) at 09/26/2018 0808 Last data filed at 09/26/2018 0546 Gross per 24 hour  Intake 448.71 ml  Output 500 ml  Net -51.29 ml    Physical Exam:    General: this is a acutely ill patient awake, conversational, slow speech, to get out of bed.    Eyes: sclera anicteric, no redness  ENT: oral mucosa moist without lesions, no cervical or supraclavicular lymphadenopathy  CV: Irregular without appreciable murmur,no JVD,, no peripheral edema  Resp: Rhonchi bilaterally, normal RR and effort noted  GI: soft, no tenderness, with active bowel sounds. No guarding or palpable organomegaly noted  Skin; warm and dry, no rash or jaundice noted, pale  Neuro: awake, alert confused as noted above. normal gross motor function and fluent speech. His fecal collection bag has copious amount of maroon blood  Labs:  Recent Labs  Lab 09/25/18 2236 09/26/18 0522  WBC 24.8* 29.5*  HGB 8.2* 7.1*  HCT 25.4* 21.4*  PLT 195 211   Recent Labs  Lab 09/25/18 2236 09/26/18 0522  NA 142 142  K 4.2 4.7  CL 111 112*  CO2 24 22  BUN 56* 63*  ALBUMIN 1.9*  --   ALKPHOS 35*  --   ALT 25  --   AST 56*  --   GLUCOSE 247* 244*  Creatinine 1.86 last evening, 2.04 this morning.  Baseline renal function unknown  Recent Labs  Lab 09/25/18 2236  INR 1.42   Troponin peaked just over 15 last evening, down to 10 today  Radiologic studies: History and physical notes that the patient's LV ejection fraction was normal on echocardiogram done at [facility].  @ASSESSMENTPLANBEGIN @ Impression:  Hematochezia Acute blood loss anemia Urosepsis with pneumonia New onset atrial fibrillation Acute renal failure, possibly with chronic component. Non-STEMI due to demand from severe blood loss anemia and hypotension Altered mental status  It is not yet clear if this bleeding is from an upper or lower GI  source, but I suspect it is more likely lower.  Plan:  Dr. Candiss Norse and I conferred about the following plan:  2 units of PRBCs now with emergent release of blood from the blood bank Direct reversal agent for his oral anticoagulant will be given Protonix drip was started overnight and will be continued. Every 6 hours hemoglobin and hematocrit after the transfusion is done. Keep patient n.p.o. IV fluid support Cancel tagged RBC scan for now, as the patient needs reversal of anticoagulation and more time for its effect to get out of his system.  He would be high risk for standard angiogram to control bleeding since he has elevated creatinine.  We will contact his family as soon as possible to to discuss goals of care.  He  would be high risk for endoscopic work-up and is not yet ready for that.  He is currently a full code.  High medical complexity.  Thank you for the courtesy of this consult.  Please contact me with any questions or concerns.  Nelida Meuse III Office: [phone]

## 2018-09-26 NOTE — Consult Note (Signed)
[place] [facility] Nurse wound consult note Reason for Consult:Nurse reports small <1cm round area of partial thickness tissue loss in the perianal area.  Patient is incontinent of moderate volume of liquid black/red stools and a fecal incontinence collector (rectal pouch, not internal) was placed within the past 4 hours.  It is currently intact.  The backing on the pouching system is a hydrocolloid and is a wound healing dressing. I will not change at this time. Wound type: Suspected moisture associated skin damage from fecal incontinence Pressure Injury POA: N/A Measurement:As reported by night shift RN, <1cm round x 0.1cm (partial thickness) Wound MBT:DHRC, moist Drainage (amount, consistency, odor) None Periwound:erythematous (blanching) as reported by RN and seemingly associated with fecal incontinence. 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 Dressing procedure/placement/frequency: I have asked Nursing to keep fecal pouch in place as they are able.  They report he will not lie on side and repeatedly tried to touch the rectal area despite stooling. I have provided guidance for the use of our house skin cleanser at it will break down the fecal enzymes and also act as a surfactant to make cleaning up less traumatic on the skin. Bilateral heel boots for pressure injury prevention.  [place] nursing team will not follow, but will remain available to this patient, the nursing and medical teams.  Please re-consult if needed. Thanks, Maudie Flakes, MSN, RN, [place], Arther Abbott  Pager# [phone]

## 2018-09-26 NOTE — Progress Notes (Signed)
  Echocardiogram 2D Echocardiogram has been performed.  Erik Morales 09/26/2018, 3:20 PM

## 2018-09-26 NOTE — Progress Notes (Addendum)
Initial labs back: 1. HGB up to 8.2 from 5.7 at [facility] following 2u PRBC transfusion. 1. 2 episodes of hematochezia overnight thus far 2. Vitals remain stable: HR 90s A.Fib, BP 102/58 3. AM repeat labs just came back, HGB back down to 7.1 overnight from 8.2. 1. Will Transfuse 1u PRBC now 2. Will order tagged RBC scan 2. Trop 15.7 noted 1. Had peaked at 3.5 over at [facility] and was trending back down, but that was before he had major GI bleed yesterday. 2. Patient denies any CP or SOB at this point 3. Getting EKG 4. Calling cards now. 1. Spoke with [facility], they will see patient, of course no immediate plans for cath given GIB 5. Cant really add BP meds due to borderline BPs and hypotension yesterday, cant add heparin or ASA due to active GI bleed. 3. Creat 1.8, had been 3.3 on admission to [facility] before trending down to 1.4 yesterday: worsening today is presumably pre-renal due to GI bleed, hypotension earlier in day yesterday 1. Strict intake and output 2. IVF: NS at 100 ordered for the moment 3. Repeating this morning.

## 2018-09-26 NOTE — Progress Notes (Signed)
PROGRESS NOTE                                                                                                                                                                                                             Patient Demographics:    Erik Morales, is a 78 y.o. male, DOB - 1941-05-27, VHQ:[id number]  Admit date - 09/25/2018   Admitting Physician Deatra James, MD  Outpatient Primary MD for the patient is Patient, No Pcp Per  LOS - 1  CC - GIB     Brief Narrative  Erik Morales is a 78 y.o. male with medical history significant of diabetes, not on insulin, hypertension on Lasix and ACE inhibitor was initially admitted to [facility] for sepsis with toxic encephalopathy due to pneumonia/UTI.  He then developed A. fib RVR was seen by cardiology, he also had a bump in his troponin was troponin levels around 5.  Cardiology placed him on Eliquis along with some cardiac medications unfortunately at [facility] he developed bright red blood per rectum.  He was then transferred to [facility] on 09/25/2018 for further care as GI coverage was not available at [facility].   Subjective:    Erik Morales today has, No headache, No chest pain, No abdominal pain - No Nausea, No new weakness tingling or numbness, No Cough - SOB.     Assessment  & Plan :     1.  Severe lower GI bleed with bright red blood per rectum causing acute blood loss related anemia.  Getting 2 units of packed RBCs urgently, giving Kcentra to reverse Eliquis, GI on board.  Monitor H&H, IV fluid bolus on top of blood transfusion as needed to keep systolic blood pressures around 90.  For now continue IV PPI.  2.  Elevated troponin.  This probably is an STEMI due to demand ischemia caused by mismatch from anemia and A. fib RVR mentation.  Due to ongoing GI bleed very limited options.  Rate controlled with amiodarone, blood pressure too low for beta-blocker bleeding hence no antiplatelet agents or heparin.   Cardiology saw the patient.  Will check echocardiogram just to evaluate EF and wall motion and monitor clinically.  Chest pain-free and other options are very limited.  3.  Assessment A. fib with RVR.  Mali vas 2 score of at least 3.  Currently on amiodarone for rate control, no anticoagulation due to GI bleed.  Monitor.  Will check TSH.  4.  ARF.  Transfuse, avoid nephrotoxins, check renal ultrasound and UA.  5.  Recently diagnosed sepsis due to pneumonia.  Continue present IV antibiotics, appears nontoxic.  Monitor.  Provide flutter valve for pulmonary toiletry.  6. DM 2 - ISS  CBG (last 3)  Recent Labs    09/26/18 0021 09/26/18 0520 09/26/18 0751  GLUCAP 274* 244* 190*      Family Communication  : Try to call family but the phone numbers do not operate  Code Status :  Full  Disposition Plan  :  TBD  Consults  :  GI, Cards  Procedures  :      DVT Prophylaxis  :  SCDs    Lab Results  Component Value Date   PLT 211 09/26/2018    Diet :  Diet Order            Diet NPO time specified Except for: Sips with Meds, Ice Chips  Diet effective now               Inpatient Medications Scheduled Meds: . sodium chloride   Intravenous Once  . insulin aspart  0-9 Units Subcutaneous Q4H  . [START ON 09/29/2018] pantoprazole  40 mg Intravenous Q12H   Continuous Infusions: . amiodarone 30 mg/hr (09/26/18 0455)  . azithromycin 500 mg (09/26/18 0325)  . cefTRIAXone (ROCEPHIN)  IV 2 g (09/26/18 0240)  . lactated ringers    . lactated ringers    . pantoprozole (PROTONIX) infusion Stopped (09/26/18 0940)   PRN Meds:.acetaminophen **OR** [DISCONTINUED] acetaminophen, [DISCONTINUED] ondansetron **OR** ondansetron (ZOFRAN) IV  Antibiotics  :   Anti-infectives (From admission, onward)   Start     Dose/Rate Route Frequency Ordered Stop   09/25/18 2230  cefTRIAXone (ROCEPHIN) 2 g in sodium chloride 0.9 % 100 mL IVPB     2 g 200 mL/hr over 30 Minutes Intravenous Every 24 hours  09/25/18 2210     09/25/18 2230  azithromycin (ZITHROMAX) 500 mg in sodium chloride 0.9 % 250 mL IVPB     500 mg 250 mL/hr over 60 Minutes Intravenous Every 24 hours 09/25/18 2210            Objective:   Vitals:   09/26/18 0900 09/26/18 0918 09/26/18 0946 09/26/18 1015  BP:  (!) 78/56 (!) 79/68 94/69  Pulse:  94 92 90  Resp:  (!) 22 19 (!) 23  Temp:  98 F (36.7 C)  98.4 F (36.9 C)  TempSrc:    Oral  SpO2:  96% 96% 97%  Weight: 112 kg     Height:        Wt Readings from Last 3 Encounters:  09/26/18 112 kg     Intake/Output Summary (Last 24 hours) at 09/26/2018 1027 Last data filed at 09/26/2018 1015 Gross per 24 hour  Intake 785.71 ml  Output 500 ml  Net 285.71 ml     Physical Exam  Awake Alert, Oriented X 3, No new F.N deficits, Normal affect [place].AT,PERRAL Supple Neck,No JVD, No cervical lymphadenopathy appriciated.  Symmetrical Chest wall movement, Good air movement bilaterally, CTAB RRR,No Gallops,Rubs or new Murmurs, No Parasternal Heave +ve B.Sounds, Abd Soft, No tenderness, No organomegaly appriciated, No rebound - guarding or rigidity. No Cyanosis, Clubbing or edema, No new Rash or bruise      Data Review:    CBC Recent Labs  Lab 09/25/18 2236 09/26/18 0522  WBC 24.8* 29.5*  HGB 8.2* 7.1*  HCT 25.4* 21.4*  PLT 195 211  MCV 87.6 88.4  MCH 28.3 29.3  MCHC 32.3 33.2  RDW 14.4 15.0    Chemistries  Recent Labs  Lab 09/25/18 2236 09/26/18 0522  NA 142 142  K 4.2 4.7  CL 111 112*  CO2 24 22  GLUCOSE 247* 244*  BUN 56* 63*  CREATININE 1.86* 2.04*  CALCIUM 7.5* 7.6*  MG  --  1.8  AST 56*  --   ALT 25  --   ALKPHOS 35*  --   BILITOT 0.4  --    ------------------------------------------------------------------------------------------------------------------ No results for input(s): CHOL, HDL, LDLCALC, TRIG, CHOLHDL, LDLDIRECT in the last 72 hours.  No results found for:  HGBA1C ------------------------------------------------------------------------------------------------------------------ No results for input(s): TSH, T4TOTAL, T3FREE, THYROIDAB in the last 72 hours.  Invalid input(s): FREET3 ------------------------------------------------------------------------------------------------------------------ No results for input(s): VITAMINB12, FOLATE, FERRITIN, TIBC, IRON, RETICCTPCT in the last 72 hours.  Coagulation profile Recent Labs  Lab 09/25/18 2236  INR 1.42    No results for input(s): DDIMER in the last 72 hours.  Cardiac Enzymes Recent Labs  Lab 09/25/18 2235 09/26/18 0522  TROPONINI 15.74* 10.23*   ------------------------------------------------------------------------------------------------------------------ No results found for: BNP  Micro Results No results found for this or any previous visit (from the past 240 hour(s)).  Radiology Reports No results found.  Time Spent in minutes  30   Lala Lund M.D on 09/26/2018 at 10:27 AM  To page go to [email or web address] - password [facility]

## 2018-09-26 NOTE — Consult Note (Addendum)
Cardiology Consultation:   Patient ID: Erik Morales; [id number]; [birth year]   Admit date: 09/25/2018 Date of Consult: 09/26/2018  Primary Care Provider: Patient, No Pcp Per Primary Cardiologist: No primary care provider on file.  Came from [facility].  Chief Complaint: confusion  Patient Profile:   Erik Morales is a 78 y.o. male with a hx of DM, HTN, HLD who is being seen today for the evaluation of troponin of 15 at the request of Dr. Alcario Drought.  He was admitted to [facility] for several days after presenting with AMS and hypothermia, found to have sepsis with UTI/PNA, acute kidney injury with Cr 3.3, troponin of 3.5, anemia, new onset PAF. He was transferred to [facility] after developing GIB with worsening anemia on anticoagulation.  History of Present Illness:   He was apparently found in a ditch after having been outside for an unknown period of time. The patient states he had ridden his lawnmower into the ditch and had to try and flag someone down to help him. He was reported to be confused with altered mental status upon arrival to [facility]. His initial workup showed hypothermia of 92.7 degrees F, leukocytosis, anemia with Hgb 10.8, AKI with BUN 74 and Cr 3.3, with mild hyperkalemia of 5.3, lactic acidosis, UDS negative. He was given saline bolus and ceftriaxone for possible UTI and sepsis. UCx eventually grew out E Coli. His initial CXR showed no edema or consolidation (+ hiatal hernia) but subsequent imaging was reported to show possible PNA so antibiotic coverage was expanded. CT head showed atrophy/small vessel disease and foci of arterial vascular calcification but no acute intracranial abnormality. Renal US w/o acute abnormality. 2D echo 09/22/18 showed technically difficult study, grossly normal LV size/function, EF 55-60%, normal appearing MV otherwise not well visualized. Cardiology consulted on 2/4 due to elevated troponin up to 3.57 at [facility], felt to be  demand ischemia. This downtrended to 0.8-0.9 by 2/6. EKGs were also reported to show paroxysmal atrial fib/flutter as well as NSR with PACs. He was started on IV heparin initially. TSH was 0.2 and free T4 was mildly elevated, suggestive of possible hyperthyroidism although f/u repeat free hormones normalized. Thyroid US showed single R nodule that did not meet criteria for bx, OP f/u recommended. He was followed by cardiology at [facility], most recently Dr. Haroldine Laws. On 2/6 he was started on amio for breakthrough tachycardia on IV diltiazem, and heparin was changed to Eliquis. Hgb was 8.1. He subsequently developed a bloody bowel movement with progressive anemia down to 5.7 associated with hypotension requiring transfusion. Blood thinners were stopped. He was transferred to [facility] for higher level of care. His labs continue to show profound leukocytosis up to 29.5k, AKI with BUN 63/Cr 2.04 (up from 1.86 last night), severely low albumin at 1.9, elevated AST 56, hyperglycemia, and Hgb down to 7.1 prompting additional transfusion. His troponin was rechecked to trend and was 15.74 with f/u level 10.23. EKG shows NSR 95bpm with one PVC, inferior Q waves with subtle suggestion of ST elevation in III with TWI III, avF, similar to 2/3 - otherwise nonspecific STT changes.   In discussion with IM this morning it sounded like he still had some intermittent confusion but the patient is A+Ox3 during exam today and able to answer almost all questions appropriately. He thinks he's seen a cardiologist in the past but does recall any prior cardiac diagnoses. He smoked remotely but quit in 1980s. Denies ETOH/drug use. Son died of  blood sugar issues in his 40s, maybe had heart problems but patient unsure.   Past Medical History:  Diagnosis Date  . DM2 (diabetes mellitus, type 2) ([place])   . HLD (hyperlipidemia)   . HTN (hypertension)     History reviewed. No pertinent surgical history.   Inpatient  Medications: Scheduled Meds: . sodium chloride   Intravenous Once  . insulin aspart  0-9 Units Subcutaneous Q4H  . [START ON 09/29/2018] pantoprazole  40 mg Intravenous Q12H   Continuous Infusions: . sodium chloride 100 mL/hr at 09/26/18 0453  . amiodarone 30 mg/hr (09/26/18 0455)  . azithromycin 500 mg (09/26/18 0325)  . cefTRIAXone (ROCEPHIN)  IV 2 g (09/26/18 0240)  . pantoprozole (PROTONIX) infusion 8 mg/hr (09/26/18 0452)   PRN Meds: acetaminophen **OR** acetaminophen, ondansetron **OR** ondansetron (ZOFRAN) IV  Home Meds: Prior to Admission medications   Not on File    Allergies:   No Known Allergies  Social History:   Social History   Socioeconomic History  . Marital status: Unknown    Spouse name: Not on file  . Number of children: Not on file  . Years of education: Not on file  . Highest education level: Not on file  Occupational History  . Not on file  Social Needs  . Financial resource strain: Not on file  . Food insecurity:    Worry: Not on file    Inability: Not on file  . Transportation needs:    Medical: Not on file    Non-medical: Not on file  Tobacco Use  . Smoking status: Former Research scientist (life sciences)  . Tobacco comment: Quit 1985  Substance and Sexual Activity  . Alcohol use: Never    Frequency: Never  . Drug use: Never  . Sexual activity: Not on file  Lifestyle  . Physical activity:    Days per week: Not on file    Minutes per session: Not on file  . Stress: Not on file  Relationships  . Social connections:    Talks on phone: Not on file    Gets together: Not on file    Attends religious service: Not on file    Active member of club or organization: Not on file    Attends meetings of clubs or organizations: Not on file    Relationship status: Not on file  . Intimate partner violence:    Fear of current or ex partner: Not on file    Emotionally abused: Not on file    Physically abused: Not on file    Forced sexual activity: Not on file  Other  Topics Concern  . Not on file  Social History Narrative  . Not on file    Family History:   The patient's family history includes Diabetes in his son.  ROS:  Please see the history of present illness.  All other ROS reviewed and negative.     Physical Exam/Data:   Vitals:   09/25/18 2132 09/25/18 2139 09/26/18 0525  BP:  108/70 (!) 102/58  Pulse:  95 93  Resp:   18  Temp:  98.7 F (37.1 C)   TempSrc:  Oral   SpO2:  99%   Height: 5\' 10"  (1.778 m)      Intake/Output Summary (Last 24 hours) at 09/26/2018 0820 Last data filed at 09/26/2018 0546 Gross per 24 hour  Intake 448.71 ml  Output 500 ml  Net -51.29 ml   No flowsheet data found.  There is no height or weight on  file to calculate BMI.  General: Pale appearing WM in no acute distress. Head: Normocephalic, atraumatic, sclera non-icteric, no xanthomas, nares are without discharge.  Neck: Negative for carotid bruits. JVD not elevated. Lungs: Coarse bilaterally with diffuse quiet expiratory wheezing. No rhonchi or rales. Breathing is unlabored. Heart: RRR with S1 S2. No murmurs, rubs, or gallops appreciated. Abdomen: Soft, non-tender, non-distended with normoactive bowel sounds. No hepatomegaly. No rebound/guarding. No obvious abdominal masses. Msk:  Strength and tone appear normal for age. Extremities: No clubbing or cyanosis. No edema.  Distal pedal pulses are 2+ and equal bilaterally. Neuro: Alert and oriented X 3. No facial asymmetry. No focal deficit. Moves all extremities spontaneously. Psych:  Responds to questions appropriately with a normal affect.  EKG:  EKG shows NSR 95bpm with one PVC, inferior Q waves with subtle suggestion of ST elevation in III with TWI III, avF, similar to 2/3 - otherwise nonspecific STT changes.   Laboratory Data:  Chemistry Recent Labs  Lab 09/25/18 2236 09/26/18 0522  NA 142 142  K 4.2 4.7  CL 111 112*  CO2 24 22  GLUCOSE 247* 244*  BUN 56* 63*  CREATININE 1.86* 2.04*  CALCIUM  7.5* 7.6*  GFRNONAA 34* 30*  GFRAA 39* 35*  ANIONGAP 7 8    Recent Labs  Lab 09/25/18 2236  PROT 4.5*  ALBUMIN 1.9*  AST 56*  ALT 25  ALKPHOS 35*  BILITOT 0.4   Hematology Recent Labs  Lab 09/25/18 2236 09/26/18 0522  WBC 24.8* 29.5*  RBC 2.90* 2.42*  HGB 8.2* 7.1*  HCT 25.4* 21.4*  MCV 87.6 88.4  MCH 28.3 29.3  MCHC 32.3 33.2  RDW 14.4 15.0  PLT 195 211   Cardiac Enzymes Recent Labs  Lab 09/25/18 2235 09/26/18 0522  TROPONINI 15.74* 10.23*   No results for input(s): TROPIPOC in the last 168 hours.  BNPNo results for input(s): BNP, PROBNP in the last 168 hours.  DDimer No results for input(s): DDIMER in the last 168 hours.  Radiology/Studies:  No results found.  Assessment and Plan:   1. Acute encephalopathy with numerous medical issues including AKI with unknown baseline renal function, sepsis with UTI and pneumonia, and progressive anemia with hematochezia with hypotension requiring blood transfusions. He is currently A+Ox3 but notes outline periodic combativeness and confusion.  2. Elevated troponin - his 2nd troponin rise here at [facility] is certainly now higher than expected for demand ischemia, suspect concomitant NSTEMI. EKG not diagnostic for STEMI. With acute GIB, anemia, and AKI he is not presently a candidate for any invasive ischemic workup. Fortunately he has not had any anginal symptoms. As troponins are now downtrending will d/c further orders to repeat to avoid excessive blood draws since it will not change management. Stress testing would not have any specific utility in acute setting as he has essentially "failed" his stress test by such a dramatic troponin rise. We will repeat an echocardiogram with [facility] to reassess LVEF as images were suboptimal at OSH. Will need to be followed in outpatient setting for timing of ischemic workup when appropriate - suspect underlying CAD. Will need ASA when/if appropriate by IM/GI team but not presently appropriate  for this. BP too soft for BB. Will add lipid profile to AM labs; consider statin if LFTs normalize.  3. Recent abnormal thyroid function - will need OP f/u.  4. Paroxysmal atrial fib - currently NSR on exam. Telemetry is challenging to interpret at times but does appear to be NSR presently. Continue  amio drip at present rate. Will review plan with MD. Not currently candidate for anticoagulation with GIB. CHADSVASC at least 4 for HTN, age, diabetes, but probably 5 for CAD as well.    Dr. Margaretann Loveless recommended f/u in about a month - have arranged f/u with Dr. Geraldo Pitter 2/26 at 9:40am in [place] office. Added to AVS.  For questions or updates, please contact [place] Please consult [email or web address] for contact info under Cardiology/STEMI.   Signed, Charlie Pitter, PA-C  09/26/2018 8:20 AM   ---------------------------------------------------------------------------------------------   History and all data above reviewed.  Patient examined.  I agree with the findings as above.  Erik Morales is a 78 year old gentleman on whom we were consulted for a troponin elevation of 15 in the setting of altered mental status, hypothermia and subsequently found to have sepsis with UTI and pneumonia.  He also was felt to have new onset paroxysmal atrial fibrillation versus PACs.  He was initially hospitalized at [facility], however subsequently was transferred to [facility] due to hematochezia while on anticoagulation.  He is currently chest pain-free, feels well, and is alert and oriented x4.  He has mild hypotension but is not symptomatic from this at this time.  Constitutional: No acute distress Cardiovascular: regular rhythm, normal rate, no murmurs. S1 and S2 normal. Radial pulses normal bilaterally. No jugular venous distention.  Respiratory: clear to auscultation bilaterally GI : normal bowel sounds, soft and nontender. No distention.   MSK: extremities warm, well perfused. No edema.  NEURO:  grossly nonfocal exam, moves all extremities. PSYCH: alert and oriented x 3, normal mood and affect.   All available labs, radiology testing, previous records reviewed. Agree with documented assessment and plan of my colleague as stated above with the following additions or changes:  Principal Problem:   Hematochezia Active Problems:   AKI (acute kidney injury) ([place])   Severe sepsis (HCC)   Atrial fibrillation with RVR (HCC)   Elevated troponin   Abnormal thyroid function test   DM2 (diabetes mellitus, type 2) (HCC)   HTN (hypertension)   E-coli UTI   CAP (community acquired pneumonia)   Pressure injury of skin    Plan: Complex medical situation, with current hematochezia. Despite elevated troponin, active bleeding is prohibitive to coronary intervention. He will likely need optimal medical therapy when tolerated from BP and overall hemodynamic stability standpoint (BB and Ace/ARB). Can repeat echo for risk stratification of LV function and RWMA. Continue IV amio while in acute phase of bleeding for afib, can covert to oral when anticipating discharge.   F/u arranged for 1 month, at which point cath can be considered if bleeding has been identified and treated.   Discussed with Dr. Candiss Norse and we are in agreement on recommendations. Cardiology will sign off.  Length of Stay:  LOS: 1 day   Elouise Munroe, MD [facility] 9:37 AM  09/26/2018

## 2018-09-27 ENCOUNTER — Inpatient Hospital Stay (HOSPITAL_COMMUNITY): Payer: Medicare Other | Admitting: Certified Registered Nurse Anesthetist

## 2018-09-27 ENCOUNTER — Inpatient Hospital Stay (HOSPITAL_COMMUNITY): Payer: Medicare Other

## 2018-09-27 ENCOUNTER — Encounter (HOSPITAL_COMMUNITY): Payer: Self-pay | Admitting: *Deleted

## 2018-09-27 ENCOUNTER — Encounter (HOSPITAL_COMMUNITY)
Admission: AD | Disposition: A | Discharge status: Rehabilitation Facility | Payer: Self-pay | Source: Other Acute Inpatient Hospital | Attending: Internal Medicine

## 2018-09-27 DIAGNOSIS — K264 Chronic or unspecified duodenal ulcer with hemorrhage: Secondary | ICD-10-CM

## 2018-09-27 DIAGNOSIS — K921 Melena: Secondary | ICD-10-CM

## 2018-09-27 DIAGNOSIS — D62 Acute posthemorrhagic anemia: Secondary | ICD-10-CM

## 2018-09-27 HISTORY — PX: SUBMUCOSAL INJECTION: SHX5543

## 2018-09-27 HISTORY — PX: ESOPHAGOGASTRODUODENOSCOPY (EGD) WITH PROPOFOL: SHX5813

## 2018-09-27 HISTORY — PX: HOT HEMOSTASIS: SHX5433

## 2018-09-27 LAB — HEMOGLOBIN AND HEMATOCRIT, BLOOD
HCT: 27.1 % — ABNORMAL LOW (ref 39.0–52.0)
HCT: 28.1 % — ABNORMAL LOW (ref 39.0–52.0)
HCT: 25.8 % — ABNORMAL LOW (ref 39.0–52.0)
Hemoglobin: 9.1 g/dL — ABNORMAL LOW (ref 13.0–17.0)
Hemoglobin: 9.1 g/dL — ABNORMAL LOW (ref 13.0–17.0)
Hemoglobin: 8.7 g/dL — ABNORMAL LOW (ref 13.0–17.0)

## 2018-09-27 LAB — GLUCOSE, CAPILLARY
GLUCOSE-CAPILLARY: 193 mg/dL — AB (ref 70–99)
Glucose-Capillary: 124 mg/dL — ABNORMAL HIGH (ref 70–99)
Glucose-Capillary: 161 mg/dL — ABNORMAL HIGH (ref 70–99)
Glucose-Capillary: 173 mg/dL — ABNORMAL HIGH (ref 70–99)
Glucose-Capillary: 183 mg/dL — ABNORMAL HIGH (ref 70–99)

## 2018-09-27 LAB — COMPREHENSIVE METABOLIC PANEL
ALBUMIN: 1.8 g/dL — AB (ref 3.5–5.0)
ALT: 21 U/L (ref 0–44)
AST: 26 U/L (ref 15–41)
Alkaline Phosphatase: 30 U/L — ABNORMAL LOW (ref 38–126)
Anion gap: 7 (ref 5–15)
BUN: 56 mg/dL — AB (ref 8–23)
CO2: 24 mmol/L (ref 22–32)
Calcium: 7.7 mg/dL — ABNORMAL LOW (ref 8.9–10.3)
Chloride: 115 mmol/L — ABNORMAL HIGH (ref 98–111)
Creatinine, Ser: 1.67 mg/dL — ABNORMAL HIGH (ref 0.61–1.24)
GFR calc Af Amer: 44 mL/min — ABNORMAL LOW (ref >60)
GFR calc non Af Amer: 38 mL/min — ABNORMAL LOW (ref >60)
GLUCOSE: 199 mg/dL — AB (ref 70–99)
Potassium: 4.3 mmol/L (ref 3.5–5.1)
Sodium: 146 mmol/L — ABNORMAL HIGH (ref 135–145)
Total Bilirubin: 0.5 mg/dL (ref 0.3–1.2)
Total Protein: 4.1 g/dL — ABNORMAL LOW (ref 6.5–8.1)

## 2018-09-27 LAB — CBC
HCT: 22.4 % — ABNORMAL LOW (ref 39.0–52.0)
Hemoglobin: 7.6 g/dL — ABNORMAL LOW (ref 13.0–17.0)
MCH: 30 pg (ref 26.0–34.0)
MCHC: 33.9 g/dL (ref 30.0–36.0)
MCV: 88.5 fL (ref 80.0–100.0)
NRBC: 0.9 % — AB (ref 0.0–0.2)
Platelets: 176 10*3/uL (ref 150–400)
RBC: 2.53 MIL/uL — ABNORMAL LOW (ref 4.22–5.81)
RDW: 14.7 % (ref 11.5–15.5)
WBC: 24.5 10*3/uL — ABNORMAL HIGH (ref 4.0–10.5)

## 2018-09-27 LAB — LIPID PANEL
CHOL/HDL RATIO: 3.8 ratio
Cholesterol: 72 mg/dL (ref 0–200)
HDL: 19 mg/dL — ABNORMAL LOW (ref >40)
LDL Cholesterol: 26 mg/dL (ref 0–99)
Triglycerides: 134 mg/dL (ref <150)
VLDL: 27 mg/dL (ref 0–40)

## 2018-09-27 LAB — MAGNESIUM: Magnesium: 1.9 mg/dL (ref 1.7–2.4)

## 2018-09-27 LAB — PROTIME-INR
INR: 1.12
PROTHROMBIN TIME: 14.3 s (ref 11.4–15.2)

## 2018-09-27 LAB — PREPARE RBC (CROSSMATCH)

## 2018-09-27 SURGERY — ESOPHAGOGASTRODUODENOSCOPY (EGD) WITH PROPOFOL
Anesthesia: Monitor Anesthesia Care

## 2018-09-27 MED ORDER — MAGNESIUM SULFATE IN D5W 1-5 GM/100ML-% IV SOLN
1.0000 g | Freq: Once | INTRAVENOUS | Status: AC
  Administered 2018-09-27: 1 g via INTRAVENOUS
  Filled 2018-09-27 (×3): qty 100

## 2018-09-27 MED ORDER — SODIUM CHLORIDE (PF) 0.9 % IJ SOLN
PREFILLED_SYRINGE | INTRAMUSCULAR | Status: DC | PRN
  Administered 2018-09-27: 4 mL

## 2018-09-27 MED ORDER — EPINEPHRINE PF 1 MG/10ML IJ SOSY
PREFILLED_SYRINGE | INTRAMUSCULAR | Status: AC
  Filled 2018-09-27: qty 10

## 2018-09-27 MED ORDER — SODIUM CHLORIDE 0.9 % IV SOLN: INTRAVENOUS | Status: DC

## 2018-09-27 MED ORDER — SODIUM CHLORIDE 0.9 % IV BOLUS
500.0000 mL | Freq: Once | INTRAVENOUS | Status: AC
  Administered 2018-09-27: 500 mL via INTRAVENOUS

## 2018-09-27 MED ORDER — SODIUM CHLORIDE 0.9% IV SOLUTION
Freq: Once | INTRAVENOUS | Status: AC
  Administered 2018-09-27: 07:00:00 via INTRAVENOUS

## 2018-09-27 MED ORDER — PHENYLEPHRINE 40 MCG/ML (10ML) SYRINGE FOR IV PUSH (FOR BLOOD PRESSURE SUPPORT)
PREFILLED_SYRINGE | INTRAVENOUS | Status: DC | PRN
  Administered 2018-09-27: 80 ug via INTRAVENOUS

## 2018-09-27 MED ORDER — DEXTROSE-NACL 5-0.45 % IV SOLN
INTRAVENOUS | Status: DC
  Administered 2018-09-27 – 2018-09-28 (×2): via INTRAVENOUS

## 2018-09-27 MED ORDER — SODIUM CHLORIDE 0.9 % IV SOLN
INTRAVENOUS | Status: DC | PRN
  Administered 2018-09-27: 11:00:00 via INTRAVENOUS

## 2018-09-27 MED ORDER — PROPOFOL 500 MG/50ML IV EMUL
INTRAVENOUS | Status: DC | PRN
  Administered 2018-09-27: 100 ug/kg/min via INTRAVENOUS

## 2018-09-27 SURGICAL SUPPLY — 15 items

## 2018-09-27 NOTE — H&P (View-Only) (Signed)
Melena continues, and patient was combative and pulled out his rectal tube.  Hgb dropped to 7.6 several hours ago, but patient DID NOT receive PRBCs as ordered per transfusion parameters that were ordered AND verbally conveyed to nursing last evening.  BP low ovn and rec'd more IVF  BP now 115/70, HR 80's  I spoke with ovn nurse about to go off shift, gave plan to give 2 units PRBCs STAT, he will get blood from bank now.  Plan is for EGD later this AM.

## 2018-09-27 NOTE — Progress Notes (Signed)
PROGRESS NOTE                                                                                                                                                                                                             Patient Demographics:    Erik Morales, is a 78 y.o. male, DOB - 1941-04-19, ASN:[id number]  Admit date - 09/25/2018   Admitting Physician Deatra James, MD  Outpatient Primary MD for the patient is Patient, No Pcp Per  LOS - 2  CC - GIB     Brief Narrative  Erik Morales is a 78 y.o. male with medical history significant of diabetes, not on insulin, hypertension on Lasix and ACE inhibitor was initially admitted to [facility] for sepsis with toxic encephalopathy due to pneumonia/UTI.  He then developed A. fib RVR was seen by cardiology, he also had a bump in his troponin was troponin levels around 5.  Cardiology placed him on Eliquis along with some cardiac medications unfortunately at [facility] he developed bright red blood per rectum.  He was then transferred to [facility] on 09/25/2018 for further care as GI coverage was not available at [facility].   Subjective:   Patient in bed, appears comfortable, denies any headache, no fever, no chest pain or pressure, no shortness of breath , no abdominal pain. No focal weakness.   Assessment  & Plan :     1.  Severe lower GI bleed with bright red blood per rectum causing acute blood loss related anemia.  Was recently placed on Eliquis and Eliquis was first, received 2 units of packed RBCs on 09/26/2018 another 2 on 09/27/2018.  Bleeding appears to be diverticular, new bleeding seems to have stopped.  GI on board going for EGD on 09/27/2018.  Continue PPI, may require bleeding scan if EGD remains inconclusive.  Continue to monitor H&H and monitor closely on stepdown.  2.  Elevated troponin.  This probably is an STEMI due to demand ischemia caused by mismatch from anemia and A. fib RVR mentation.  Due to ongoing GI  bleed very limited options.  Rate controlled with amiodarone, blood pressure too low for beta-blocker bleeding hence no antiplatelet agents or heparin.  Seen by cardiology, chest pain-free with stable echocardiogram showing preserved EF and wall motion.  3.  Assessment A. fib with RVR.  Mali vas 2 score of at least 3.  Currently on amiodarone for rate control, no anticoagulation due to GI bleed.  Stable TSH and  echocardiogram.  4.  ARF.  Transfuse, avoid nephrotoxins, markable UA and renal ultrasound  5.  Recently diagnosed sepsis due to pneumonia.  Continue present IV antibiotics, appears nontoxic.  Monitor.  Provide flutter valve for pulmonary toiletry.  Will check two-view chest x-ray today for follow-up.  6. DM 2 - ISS  CBG (last 3)  Recent Labs    09/26/18 2032 09/27/18 0014 09/27/18 0833  GLUCAP 160* 193* 173*      Family Communication  : Daughter and son-in-law at bedside  Code Status :  Full  Disposition Plan  :  TBD  Consults  :  GI, Cards  Procedures  :    EGD  TTE -   1. The left ventricle has normal systolic function of 16-10%. The cavity size was normal. There is mildly increased left ventricular wall thickness. Echo evidence of impaired diastolic relaxation.  2. The right ventricle has normal systolic function. The cavity was normal. There is no increase in right ventricular wall thickness.  3. Left atrial size was mildly dilated.  4. The mitral valve is normal in structure.  5. The tricuspid valve is normal in structure.  6. The aortic valve is normal in structure. There is mild thickening and mild calcification of the aortic valve.  7. The pulmonic valve was normal in structure.  8. The aortic root and ascending aorta are normal in size and structure.  DVT Prophylaxis  :  SCDs    Lab Results  Component Value Date   PLT 176 09/27/2018    Diet :  Diet Order            Diet NPO time specified Except for: Sips with Meds, Ice Chips  Diet effective now                Inpatient Medications Scheduled Meds: . [MAR Hold] sodium chloride   Intravenous Once  . [MAR Hold] insulin aspart  0-9 Units Subcutaneous Q4H  . [MAR Hold] pantoprazole  40 mg Intravenous Q12H   Continuous Infusions: . sodium chloride    . amiodarone 30 mg/hr (09/27/18 1100)  . [MAR Hold] azithromycin 500 mg (09/26/18 2224)  . [MAR Hold] cefTRIAXone (ROCEPHIN)  IV 2 g (09/26/18 2339)  . pantoprozole (PROTONIX) infusion 8 mg/hr (09/27/18 0131)   PRN Meds:.[MAR Hold] acetaminophen **OR** [DISCONTINUED] acetaminophen, [DISCONTINUED] ondansetron **OR** [MAR Hold] ondansetron (ZOFRAN) IV  Antibiotics  :   Anti-infectives (From admission, onward)   Start     Dose/Rate Route Frequency Ordered Stop   09/25/18 2230  [MAR Hold]  cefTRIAXone (ROCEPHIN) 2 g in sodium chloride 0.9 % 100 mL IVPB     (MAR Hold since Sat 09/27/2018 at 1031. Reason: Transfer to a Procedural area.)   2 g 200 mL/hr over 30 Minutes Intravenous Every 24 hours 09/25/18 2210     09/25/18 2230  [MAR Hold]  azithromycin (ZITHROMAX) 500 mg in sodium chloride 0.9 % 250 mL IVPB     (MAR Hold since Sat 09/27/2018 at 1031. Reason: Transfer to a Procedural area.)   500 mg 250 mL/hr over 60 Minutes Intravenous Every 24 hours 09/25/18 2210            Objective:   Vitals:   09/27/18 0705 09/27/18 0915 09/27/18 1017 09/27/18 1034  BP: 103/72 106/62 (!) 100/54 (!) 121/58  Pulse: 93 88 (!) 129 87  Resp: 16   20  Temp: 98.1 F (36.7 C) (!) 97.4 F (36.3 C) (!) 97.1 F (36.2 C) 98.8  F (37.1 C)  TempSrc: Oral Oral Oral Oral  SpO2: 96% 95%  98%  Weight:      Height:        Wt Readings from Last 3 Encounters:  09/26/18 112 kg     Intake/Output Summary (Last 24 hours) at 09/27/2018 1113 Last data filed at 09/27/2018 1022 Gross per 24 hour  Intake 3242.48 ml  Output 3025 ml  Net 217.48 ml     Physical Exam  Awake Alert, Oriented X 3, No new F.N deficits, Normal affect [place].AT,PERRAL Supple Neck,No  JVD, No cervical lymphadenopathy appriciated.  Symmetrical Chest wall movement, Good air movement bilaterally, CTAB iRRR,No Gallops, Rubs or new Murmurs, No Parasternal Heave +ve B.Sounds, Abd Soft, No tenderness, No organomegaly appriciated, No rebound - guarding or rigidity. No Cyanosis, Clubbing or edema, No new Rash or bruise    Data Review:    CBC Recent Labs  Lab 09/25/18 2236 09/26/18 0522 09/26/18 1827 09/27/18 0234  WBC 24.8* 29.5*  --  24.5*  HGB 8.2* 7.1* 8.2* 7.6*  HCT 25.4* 21.4* 24.1* 22.4*  PLT 195 211  --  176  MCV 87.6 88.4  --  88.5  MCH 28.3 29.3  --  30.0  MCHC 32.3 33.2  --  33.9  RDW 14.4 15.0  --  14.7    Chemistries  Recent Labs  Lab 09/25/18 2236 09/26/18 0522 09/27/18 0234  NA 142 142 146*  K 4.2 4.7 4.3  CL 111 112* 115*  CO2 24 22 24   GLUCOSE 247* 244* 199*  BUN 56* 63* 56*  CREATININE 1.86* 2.04* 1.67*  CALCIUM 7.5* 7.6* 7.7*  MG  --  1.8 1.9  AST 56*  --  26  ALT 25  --  21  ALKPHOS 35*  --  30*  BILITOT 0.4  --  0.5   ------------------------------------------------------------------------------------------------------------------ Recent Labs    09/27/18 0234  CHOL 72  HDL 19*  LDLCALC 26  TRIG 134  CHOLHDL 3.8    No results found for: HGBA1C ------------------------------------------------------------------------------------------------------------------ Recent Labs    09/26/18 1827  TSH 0.430   ------------------------------------------------------------------------------------------------------------------ No results for input(s): VITAMINB12, FOLATE, FERRITIN, TIBC, IRON, RETICCTPCT in the last 72 hours.  Coagulation profile Recent Labs  Lab 09/25/18 2236 09/27/18 0234  INR 1.42 1.12    No results for input(s): DDIMER in the last 72 hours.  Cardiac Enzymes Recent Labs  Lab 09/25/18 2235 09/26/18 0522  TROPONINI 15.74* 10.23*    ------------------------------------------------------------------------------------------------------------------ No results found for: BNP  Micro Results No results found for this or any previous visit (from the past 240 hour(s)).  Radiology Reports US Renal  Result Date: 09/26/2018 CLINICAL DATA:  Acute renal failure. EXAM: RENAL / URINARY TRACT ULTRASOUND COMPLETE COMPARISON:  Renal ultrasound, 09/22/2018 FINDINGS: Right Kidney: Renal measurements: 9.9 x 5.3 x 6.0 cm = volume: 164 mL. Mildly increased parenchymal echogenicity. Diffuse renal cortical thinning. No masses, stones or hydronephrosis. Left Kidney: Renal measurements: 10.2 x 5.9 x 6.2 cm = volume: 195 mL. Borderline increased parenchymal echogenicity. Diffuse renal cortical thinning. No masses, stones or hydronephrosis. Bladder: Decompressed with a Foley catheter. IMPRESSION: 1. No acute findings.  No hydronephrosis. 2. Bilateral renal cortical thinning. Increased right and borderline increased left renal parenchymal echogenicity. Findings consistent with medical renal disease. Electronically Signed   By: Lajean Manes M.D.   On: 09/26/2018 16:28    Time Spent in minutes  30   Lala Lund M.D on 09/27/2018 at 11:13 AM  To page go  to [email or web address] - password [facility]

## 2018-09-27 NOTE — Anesthesia Postprocedure Evaluation (Signed)
Anesthesia Post Note  Patient: Erik Morales  Procedure(s) Performed: ESOPHAGOGASTRODUODENOSCOPY (EGD) WITH PROPOFOL (N/A ) HOT HEMOSTASIS (ARGON PLASMA COAGULATION/BICAP) (N/A ) SUBMUCOSAL INJECTION     Patient location during evaluation: PACU Anesthesia Type: MAC Level of consciousness: awake and alert Pain management: pain level controlled Vital Signs Assessment: post-procedure vital signs reviewed and stable Respiratory status: spontaneous breathing, nonlabored ventilation, respiratory function stable and patient connected to nasal cannula oxygen Cardiovascular status: stable and blood pressure returned to baseline Postop Assessment: no apparent nausea or vomiting Anesthetic complications: no    Last Vitals:  Vitals:   09/27/18 1200 09/27/18 1228  BP: (!) 145/80 123/75  Pulse: 88 88  Resp: 20 20  Temp:  37.2 C  SpO2: 97% 97%    Last Pain:  Vitals:   09/27/18 1228  TempSrc: Oral  PainSc:                  Jennine Peddy DAVID

## 2018-09-27 NOTE — Op Note (Signed)
[employer] [facility] [employer] Patient Name: Erik Morales Procedure Date : 09/27/2018 MRN: [id number] Attending MD: Estill Cotta. Loletha Carrow , MD Date of Birth: [birth year] CSN: [id number] Age: 78 Admit Type: Inpatient Procedure:                Upper GI endoscopy Indications:              Acute post hemorrhagic anemia, Melena Providers:                Mallie Mussel L. Loletha Carrow, MD, Jeanella Cara, RN,                            William Dalton, Technician Referring MD:             [facility] Medicines:                Monitored Anesthesia Care Complications:            No immediate complications. Estimated Blood Loss:     Estimated blood loss was minimal (from scope                            trauma). Procedure:                Pre-Anesthesia Assessment:                           - Prior to the procedure, a History and Physical                            was performed, and patient medications and                            allergies were reviewed. The patient's tolerance of                            previous anesthesia was also reviewed. The risks                            and benefits of the procedure and the sedation                            options and risks were discussed with the patient.                            All questions were answered, and informed consent                            was obtained. Prior Anticoagulants: The patient has                            taken Eliquis (apixaban), last dose was 2 days                            prior to procedure. ASA Grade Assessment: IV - A  patient with severe systemic disease that is a                            constant threat to life. After reviewing the risks                            and benefits, the patient was deemed in                            satisfactory condition to undergo the procedure.                           After obtaining informed consent, the endoscope was                            passed  under direct vision. Throughout the                            procedure, the patient's blood pressure, pulse, and                            oxygen saturations were monitored continuously. The                            GIF-H190 ([id number]) Olympus gastroscope was                            introduced through the mouth, and advanced to the                            second part of duodenum. The upper GI endoscopy was                            accomplished without difficulty. The patient                            tolerated the procedure fairly well. Scope In: Scope Out: Findings:      A widely patent Schatzki ring was found at the gastroesophageal junction.      A large hiatal hernia was present. At the conclusion of the exam, there       was scope trauma from looping in the proximal stomach.      The stomach was normal.      One non-bleeding cratered duodenal ulcer with a visible vessel was found       in the duodenal bulb. The lesion was 8 mm in largest dimension. Area was       successfully injected with 4 mL of a 1:10,000 solution of epinephrine       for hemostasis. Coagulation for hemostasis using bipolar probe was       successful.      The exam of the duodenum was otherwise normal. Impression:               - Widely patent Schatzki ring.                           -  Large hiatal hernia.                           - Normal stomach.                           - One non-bleeding duodenal ulcer with a visible                            vessel. Injected. Treated with bipolar cautery.                           - No specimens collected. Recommendation:           - Return patient to hospital ward for ongoing care.                           - Full liquid diet.                           - Continue present medications, including                            pantoprazole drip.                           - H 8 hour Hgb and Hct x 3 , then further plan to                            follow.                            H pylori serology                           No aspirin, antiplatelet agents or anticoagulants. Procedure Code(s):        --- Professional ---                           [phone], Esophagogastroduodenoscopy, flexible,                            transoral; with control of bleeding, any method Diagnosis Code(s):        --- Professional ---                           K22.2, Esophageal obstruction                           K44.9, Diaphragmatic hernia without obstruction or                            gangrene                           K26.4, Chronic or unspecified duodenal ulcer with  hemorrhage                           D62, Acute posthemorrhagic anemia                           K92.1, Melena (includes Hematochezia) CPT copyright 2018 American Medical Association. All rights reserved. The codes documented in this report are preliminary and upon coder review may  be revised to meet current compliance requirements. Henry L. Loletha Carrow, MD 09/27/2018 11:38:06 AM This report has been signed electronically. Number of Addenda: 0

## 2018-09-27 NOTE — Progress Notes (Signed)
Melena continues, and patient was combative and pulled out his rectal tube.  Hgb dropped to 7.6 several hours ago, but patient DID NOT receive PRBCs as ordered per transfusion parameters that were ordered AND verbally conveyed to nursing last evening.  BP low ovn and rec'd more IVF  BP now 115/70, HR 80's  I spoke with ovn nurse about to go off shift, gave plan to give 2 units PRBCs STAT, he will get blood from bank now.  Plan is for EGD later this AM.

## 2018-09-27 NOTE — Transfer of Care (Signed)
Immediate Anesthesia Transfer of Care Note  Patient: Erik Morales  Procedure(s) Performed: ESOPHAGOGASTRODUODENOSCOPY (EGD) WITH PROPOFOL (N/A ) HEMOSTASIS CONTROL  Patient Location: Endoscopy Unit  Anesthesia Type:MAC  Level of Consciousness: awake  Airway & Oxygen Therapy: Patient Spontanous Breathing and Patient connected to nasal cannula oxygen  Post-op Assessment: Report given to RN and Post -op Vital signs reviewed and stable  Post vital signs: Reviewed and stable  Last Vitals:  Vitals Value Taken Time  BP    Temp    Pulse 87 09/27/2018 11:30 AM  Resp 25 09/27/2018 11:30 AM  SpO2 98 % 09/27/2018 11:30 AM  Vitals shown include unvalidated device data.  Last Pain:  Vitals:   09/27/18 1034  TempSrc: Oral  PainSc: 0-No pain         Complications: No apparent anesthesia complications

## 2018-09-27 NOTE — Progress Notes (Signed)
Patient had afib with RVR sustaining in the high 130s to 140s. Blood pressure was 75/55. Notified on call provider and received orders for amiodarone bolus and 500 ml bolus of Normal Saline and to continue the amiodarone drip at 30mg /hr afterwards. The patients blood pressure improved to 109/70. The patient is now normal sinus rhythm with a heart rate of 86.

## 2018-09-27 NOTE — Interval H&P Note (Signed)
History and Physical Interval Note:  09/27/2018 10:50 AM  Erik Morales  has presented today for surgery, with the diagnosis of melena and anemia  The various methods of treatment have been discussed with the patient and family. After consideration of risks, benefits and other options for treatment, the patient has consented to  Procedure(s): ESOPHAGOGASTRODUODENOSCOPY (EGD) WITH PROPOFOL (N/A) as a surgical intervention .  The patient's history has been reviewed, patient examined, no change in status, stable for surgery.  I have reviewed the patient's chart and labs.  Questions were answered to the patient's satisfaction.    Is receiving his second of 2 planned units of PRBCs.  BP 140/70, HR 80, alert.  2 granddaughters at bedside.  Nelida Meuse III

## 2018-09-27 NOTE — Plan of Care (Signed)

## 2018-09-27 NOTE — Anesthesia Preprocedure Evaluation (Signed)
Anesthesia Evaluation  Patient identified by MRN, date of birth, ID band Patient awake    Reviewed: Allergy & Precautions, NPO status , Patient's Chart, lab work & pertinent test results  Airway Mallampati: I  TM Distance: >3 FB Neck ROM: Full    Dental   Pulmonary former smoker,    Pulmonary exam normal        Cardiovascular hypertension, Pt. on medications Normal cardiovascular exam  + Troponin but normal ECHO   Neuro/Psych    GI/Hepatic   Endo/Other  diabetes, Type 2, Oral Hypoglycemic Agents  Renal/GU      Musculoskeletal   Abdominal   Peds  Hematology   Anesthesia Other Findings   Reproductive/Obstetrics                             Anesthesia Physical Anesthesia Plan  ASA: III and emergent  Anesthesia Plan: MAC   Post-op Pain Management:    Induction: Intravenous  PONV Risk Score and Plan: 1 and Treatment may vary due to age or medical condition  Airway Management Planned: Simple Face Mask  Additional Equipment:   Intra-op Plan:   Post-operative Plan:   Informed Consent: I have reviewed the patients History and Physical, chart, labs and discussed the procedure including the risks, benefits and alternatives for the proposed anesthesia with the patient or authorized representative who has indicated his/her understanding and acceptance.       Plan Discussed with: CRNA and Surgeon  Anesthesia Plan Comments:         Anesthesia Quick Evaluation

## 2018-09-28 LAB — TYPE AND SCREEN
ABO/RH(D): A POS
Antibody Screen: NEGATIVE
UNIT DIVISION: 0
Unit division: 0
Unit division: 0
Unit division: 0

## 2018-09-28 LAB — CBC
HCT: 27.9 % — ABNORMAL LOW (ref 39.0–52.0)
Hemoglobin: 8.9 g/dL — ABNORMAL LOW (ref 13.0–17.0)
MCH: 29.1 pg (ref 26.0–34.0)
MCHC: 31.9 g/dL (ref 30.0–36.0)
MCV: 91.2 fL (ref 80.0–100.0)
Platelets: 170 10*3/uL (ref 150–400)
RBC: 3.06 MIL/uL — ABNORMAL LOW (ref 4.22–5.81)
RDW: 15 % (ref 11.5–15.5)
WBC: 20.9 10*3/uL — ABNORMAL HIGH (ref 4.0–10.5)
nRBC: 1.3 % — ABNORMAL HIGH (ref 0.0–0.2)

## 2018-09-28 LAB — BPAM RBC
BLOOD PRODUCT EXPIRATION DATE: 202003032359
Blood Product Expiration Date: 202002242359
Blood Product Expiration Date: 202002272359
Blood Product Expiration Date: 202003022359
ISSUE DATE / TIME: 202002070856
ISSUE DATE / TIME: 202002071145
ISSUE DATE / TIME: 202002080654
ISSUE DATE / TIME: 202002081013
UNIT TYPE AND RH: 6200
Unit Type and Rh: 6200
Unit Type and Rh: 6200
Unit Type and Rh: 6200

## 2018-09-28 LAB — GLUCOSE, CAPILLARY
Glucose-Capillary: 115 mg/dL — ABNORMAL HIGH (ref 70–99)
Glucose-Capillary: 131 mg/dL — ABNORMAL HIGH (ref 70–99)
Glucose-Capillary: 154 mg/dL — ABNORMAL HIGH (ref 70–99)
Glucose-Capillary: 145 mg/dL — ABNORMAL HIGH (ref 70–99)
Glucose-Capillary: 139 mg/dL — ABNORMAL HIGH (ref 70–99)

## 2018-09-28 LAB — COMPREHENSIVE METABOLIC PANEL
ALT: 20 U/L (ref 0–44)
ANION GAP: 8 (ref 5–15)
AST: 25 U/L (ref 15–41)
Albumin: 2 g/dL — ABNORMAL LOW (ref 3.5–5.0)
Alkaline Phosphatase: 33 U/L — ABNORMAL LOW (ref 38–126)
BUN: 40 mg/dL — ABNORMAL HIGH (ref 8–23)
CALCIUM: 8 mg/dL — AB (ref 8.9–10.3)
CO2: 23 mmol/L (ref 22–32)
Chloride: 114 mmol/L — ABNORMAL HIGH (ref 98–111)
Creatinine, Ser: 1.52 mg/dL — ABNORMAL HIGH (ref 0.61–1.24)
GFR calc Af Amer: 50 mL/min — ABNORMAL LOW (ref >60)
GFR calc non Af Amer: 43 mL/min — ABNORMAL LOW (ref >60)
Glucose, Bld: 136 mg/dL — ABNORMAL HIGH (ref 70–99)
Potassium: 3.8 mmol/L (ref 3.5–5.1)
SODIUM: 145 mmol/L (ref 135–145)
Total Bilirubin: 0.5 mg/dL (ref 0.3–1.2)
Total Protein: 4.3 g/dL — ABNORMAL LOW (ref 6.5–8.1)

## 2018-09-28 LAB — MAGNESIUM: Magnesium: 2 mg/dL (ref 1.7–2.4)

## 2018-09-28 MED ORDER — POTASSIUM CHLORIDE CRYS ER 20 MEQ PO TBCR
20.0000 meq | EXTENDED_RELEASE_TABLET | Freq: Once | ORAL | Status: AC
  Administered 2018-09-28: 20 meq via ORAL
  Filled 2018-09-28: qty 1

## 2018-09-28 MED ORDER — AMIODARONE HCL 200 MG PO TABS
200.0000 mg | ORAL_TABLET | Freq: Two times a day (BID) | ORAL | Status: DC
  Administered 2018-09-28 – 2018-10-02 (×9): 200 mg via ORAL
  Filled 2018-09-28 (×9): qty 1

## 2018-09-28 MED ORDER — AMOXICILLIN-POT CLAVULANATE 875-125 MG PO TABS
1.0000 | ORAL_TABLET | Freq: Two times a day (BID) | ORAL | Status: DC
  Administered 2018-09-28 – 2018-10-02 (×9): 1 via ORAL
  Filled 2018-09-28 (×9): qty 1

## 2018-09-28 MED ORDER — PANTOPRAZOLE SODIUM 40 MG PO TBEC
40.0000 mg | DELAYED_RELEASE_TABLET | Freq: Two times a day (BID) | ORAL | Status: DC
  Administered 2018-09-28 – 2018-10-02 (×9): 40 mg via ORAL
  Filled 2018-09-28 (×9): qty 1

## 2018-09-28 MED ORDER — GUAIFENESIN-DM 100-10 MG/5ML PO SYRP
5.0000 mL | ORAL_SOLUTION | ORAL | Status: DC | PRN
  Administered 2018-09-28 (×2): 5 mL via ORAL
  Filled 2018-09-28 (×2): qty 5

## 2018-09-28 NOTE — Progress Notes (Signed)
PROGRESS NOTE                                                                                                                                                                                                             Patient Demographics:    Erik Morales, is a 78 y.o. male, DOB - 08/17/1941, BSJ:[id number]  Admit date - 09/25/2018   Admitting Physician Deatra James, MD  Outpatient Primary MD for the patient is Patient, No Pcp Per  LOS - 3  CC - GIB     Brief Narrative  Erik Morales is a 78 y.o. male with medical history significant of diabetes, not on insulin, hypertension on Lasix and ACE inhibitor was initially admitted to [facility] for sepsis with toxic encephalopathy due to pneumonia/UTI.  He then developed A. fib RVR was seen by cardiology, he also had a bump in his troponin was troponin levels around 5.  Cardiology placed him on Eliquis along with some cardiac medications unfortunately at [facility] he developed bright red blood per rectum.  He was then transferred to [facility] on 09/25/2018 for further care as GI coverage was not available at [facility].   Subjective:   Patient in bed, appears comfortable, denies any headache, no fever, no chest pain or pressure, no shortness of breath , no abdominal pain. No focal weakness.   Assessment  & Plan :     1.  Severe upper GI bleed causing acute blood loss related anemia.  Was on Eliquis which was reversed, received 4 units of packed RBC this admission so far, underwent EGD showing a nonbleeding duodenal ulcer with a visible vessel.  IV PPI transition to oral, case discussed with GI physician Dr. Simona Huh on 09/28/2018.  Can resume Eliquis with caution on 10/14/2018 till that time refrain from all anticoagulants and antiplatelet agents.  CBC tomorrow, advance activity, advance diet.  If stable likely discharge tomorrow.  2.  Elevated troponin.  This probably is NSTEMI due to demand ischemia caused by mismatch from  anemia and A. fib RVR mentation.  Due to ongoing GI bleed very limited options.  Rate controlled with amiodarone, blood pressure too low for beta-blocker bleeding hence no antiplatelet agents or heparin.  Seen by cardiology, chest pain-free with stable echocardiogram showing preserved EF and wall motion.  3.  Parox. A. fib with RVR.  Mali vas 2 score of at least 3.  Now been transitioned to amiodarone 200 twice daily, on 10/08/2018 PCP to switch  her to once a day, hold all anticoagulation and antiplatelets due to GI ulcer and bleeding per GI, discussed with Dr. Simona Huh gastroenterologist on 09/28/2018, Eliquis can be started with caution on 10/14/2018.  4.  ARF.  Transfuse, avoid nephrotoxins, markable UA and renal ultrasound  5.  Recently diagnosed sepsis due to pneumonia.  Stable with IV antibiotics, chest x-ray confirms right lower lobe pneumonia, no clinical history suggestive of aspiration, switch to oral antibiotics, encouraged to sit up, minimize oxygen use, prepare for discharge tomorrow.  6. DM 2 - ISS  CBG (last 3)  Recent Labs    09/27/18 2356 09/28/18 0354 09/28/18 0752  GLUCAP 124* 115* 131*      Family Communication  : Daughter and son-in-law at bedside  Code Status :  Full  Disposition Plan  :  TBD  Consults  :  GI, Cards  Procedures  :    EGD  Impression:      - Widely patent Schatzki ring.                           - Large hiatal hernia.                           - Normal stomach.                           - One non-bleeding duodenal ulcer with a visible                            vessel. Injected. Treated with bipolar cautery.                           - No specimens collected.  Recommendation:                              - Return patient to hospital ward for ongoing care.                           - Full liquid diet.                           - Continue present medications, including                            pantoprazole drip.                           -  H 8 hour Hgb and Hct x 3 , then further plan to                            follow.                           H pylori serology                           No aspirin, antiplatelet agents or anticoagulants.  TTE -   1. The left ventricle has normal systolic function of 60-45%.  The cavity size was normal. There is mildly increased left ventricular wall thickness. Echo evidence of impaired diastolic relaxation.  2. The right ventricle has normal systolic function. The cavity was normal. There is no increase in right ventricular wall thickness.  3. Left atrial size was mildly dilated.  4. The mitral valve is normal in structure.  5. The tricuspid valve is normal in structure.  6. The aortic valve is normal in structure. There is mild thickening and mild calcification of the aortic valve.  7. The pulmonic valve was normal in structure.  8. The aortic root and ascending aorta are normal in size and structure.  DVT Prophylaxis  :  SCDs    Lab Results  Component Value Date   PLT 170 09/28/2018    Diet :  Diet Order            Diet Carb Modified Fluid consistency: Thin; Room service appropriate? Yes  Diet effective now               Inpatient Medications Scheduled Meds: . amiodarone  200 mg Oral BID  . insulin aspart  0-9 Units Subcutaneous Q4H  . pantoprazole  40 mg Oral BID AC   Continuous Infusions: . azithromycin 500 mg (09/27/18 2226)  . cefTRIAXone (ROCEPHIN)  IV 2 g (09/27/18 2110)   PRN Meds:.acetaminophen **OR** [DISCONTINUED] acetaminophen, [DISCONTINUED] ondansetron **OR** ondansetron (ZOFRAN) IV  Antibiotics  :   Anti-infectives (From admission, onward)   Start     Dose/Rate Route Frequency Ordered Stop   09/25/18 2230  cefTRIAXone (ROCEPHIN) 2 g in sodium chloride 0.9 % 100 mL IVPB     2 g 200 mL/hr over 30 Minutes Intravenous Every 24 hours 09/25/18 2210     09/25/18 2230  azithromycin (ZITHROMAX) 500 mg in sodium chloride 0.9 % 250 mL IVPB     500 mg 250 mL/hr  over 60 Minutes Intravenous Every 24 hours 09/25/18 2210          Objective:   Vitals:   09/28/18 0028 09/28/18 0126 09/28/18 0335 09/28/18 0646  BP: (!) 92/51 (!) 101/51 (!) 117/57 (!) 109/54  Pulse: 71 71 79 72  Resp: 18 15 (!) 23 (!) 22  Temp: 97.9 F (36.6 C)  98.1 F (36.7 C)   TempSrc: Oral  Oral   SpO2: 96% 99% 99% 97%  Weight:      Height:        Wt Readings from Last 3 Encounters:  09/26/18 112 kg     Intake/Output Summary (Last 24 hours) at 09/28/2018 0935 Last data filed at 09/28/2018 0308 Gross per 24 hour  Intake 825.02 ml  Output 1950 ml  Net -1124.98 ml     Physical Exam  Awake Alert, Oriented X 3, No new F.N deficits, Normal affect [place].AT,PERRAL Supple Neck,No JVD, No cervical lymphadenopathy appriciated.  Symmetrical Chest wall movement, Good air movement bilaterally, CTAB RRR,No Gallops, Rubs or new Murmurs, No Parasternal Heave +ve B.Sounds, Abd Soft, No tenderness, No organomegaly appriciated, No rebound - guarding or rigidity. No Cyanosis, Clubbing or edema, No new Rash or bruise    Data Review:    CBC Recent Labs  Lab 09/25/18 2236 09/26/18 0522  09/27/18 0234 09/27/18 1533 09/27/18 1813 09/27/18 2121 09/28/18 0355  WBC 24.8* 29.5*  --  24.5*  --   --   --  20.9*  HGB 8.2* 7.1*   < > 7.6* 9.1* 9.1* 8.7* 8.9*  HCT 25.4* 21.4*   < >  22.4* 27.1* 28.1* 25.8* 27.9*  PLT 195 211  --  176  --   --   --  170  MCV 87.6 88.4  --  88.5  --   --   --  91.2  MCH 28.3 29.3  --  30.0  --   --   --  29.1  MCHC 32.3 33.2  --  33.9  --   --   --  31.9  RDW 14.4 15.0  --  14.7  --   --   --  15.0   < > = values in this interval not displayed.    Chemistries  Recent Labs  Lab 09/25/18 2236 09/26/18 0522 09/27/18 0234 09/28/18 0355  NA 142 142 146* 145  K 4.2 4.7 4.3 3.8  CL 111 112* 115* 114*  CO2 24 22 24 23   GLUCOSE 247* 244* 199* 136*  BUN 56* 63* 56* 40*  CREATININE 1.86* 2.04* 1.67* 1.52*  CALCIUM 7.5* 7.6* 7.7* 8.0*  MG  --  1.8  1.9 2.0  AST 56*  --  26 25  ALT 25  --  21 20  ALKPHOS 35*  --  30* 33*  BILITOT 0.4  --  0.5 0.5   ------------------------------------------------------------------------------------------------------------------ Recent Labs    09/27/18 0234  CHOL 72  HDL 19*  LDLCALC 26  TRIG 134  CHOLHDL 3.8    No results found for: HGBA1C ------------------------------------------------------------------------------------------------------------------ Recent Labs    09/26/18 1827  TSH 0.430   ------------------------------------------------------------------------------------------------------------------ No results for input(s): VITAMINB12, FOLATE, FERRITIN, TIBC, IRON, RETICCTPCT in the last 72 hours.  Coagulation profile Recent Labs  Lab 09/25/18 2236 09/27/18 0234  INR 1.42 1.12    No results for input(s): DDIMER in the last 72 hours.  Cardiac Enzymes Recent Labs  Lab 09/25/18 2235 09/26/18 0522  TROPONINI 15.74* 10.23*   ------------------------------------------------------------------------------------------------------------------ No results found for: BNP  Micro Results No results found for this or any previous visit (from the past 240 hour(s)).  Radiology Reports Dg Chest 2 View  Result Date: 09/27/2018 CLINICAL DATA:  Toxic encephalopathy, pneumonia, hypertension, type II diabetes mellitus EXAM: CHEST - 2 VIEW COMPARISON:  09/24/2018 FINDINGS: Upper normal heart size. Atherosclerotic calcification aorta. Mediastinal contours and pulmonary vascularity normal. Questionable RIGHT basilar infiltrate. Remaining lungs clear. Small pleural effusions blunt the posterior costophrenic angles. No pneumothorax. Endplate spur formation thoracic spine. IMPRESSION: Small BILATERAL pleural effusions and questionable RIGHT lower lobe infiltrate. Electronically Signed   By: Lavonia Dana M.D.   On: 09/27/2018 18:46   US Renal  Result Date: 09/26/2018 CLINICAL DATA:  Acute renal  failure. EXAM: RENAL / URINARY TRACT ULTRASOUND COMPLETE COMPARISON:  Renal ultrasound, 09/22/2018 FINDINGS: Right Kidney: Renal measurements: 9.9 x 5.3 x 6.0 cm = volume: 164 mL. Mildly increased parenchymal echogenicity. Diffuse renal cortical thinning. No masses, stones or hydronephrosis. Left Kidney: Renal measurements: 10.2 x 5.9 x 6.2 cm = volume: 195 mL. Borderline increased parenchymal echogenicity. Diffuse renal cortical thinning. No masses, stones or hydronephrosis. Bladder: Decompressed with a Foley catheter. IMPRESSION: 1. No acute findings.  No hydronephrosis. 2. Bilateral renal cortical thinning. Increased right and borderline increased left renal parenchymal echogenicity. Findings consistent with medical renal disease. Electronically Signed   By: Lajean Manes M.D.   On: 09/26/2018 16:28    Time Spent in minutes  30   Lala Lund M.D on 09/28/2018 at 9:35 AM  To page go to [email or web address] - password [facility]

## 2018-09-28 NOTE — Plan of Care (Signed)
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
## 2018-09-28 NOTE — Progress Notes (Signed)
Rehab Admissions Coordinator Note:  Patient was screened by Retta Diones for appropriateness for an Inpatient Acute Rehab Consult.  At this time, we are recommending Inpatient Rehab consult.  Jodell Cipro M 09/28/2018, 4:56 PM  I can be reached at [phone].

## 2018-09-28 NOTE — Progress Notes (Signed)
[facility] Progress Note  Chief Complaint: Melena  History:  His melena has reportedly decreased significantly since yesterday.  He denies abdominal pain and has not been vomiting.  He is tolerating a full liquid diet so far since his upper endoscopy yesterday.  He has remained in sinus rhythm on an amiodarone drip.  ROS: Cardiovascular:  no chest pain Respiratory: no dyspnea  Objective:  Med list reviewed  Vital signs in last 24 hrs: Vitals:   09/28/18 0335 09/28/18 0646  BP: (!) 117/57 (!) 109/54  Pulse: 79 72  Resp: (!) 23 (!) 22  Temp: 98.1 F (36.7 C)   SpO2: 99% 97%    Physical Exam He is pale and fatigued, but no longer toxic appearing.  He looks comfortable today, is alert and conversational but confused  HEENT: sclera anicteric, oral mucosa without lesions but dry  Neck: supple, no thyromegaly, JVD or lymphadenopathy  Cardiac: RRR without murmurs, S1S2 heard, mild peripheral edema  Pulm: Scattered rhonchi bilaterally, normal RR and effort noted  Abdomen: soft, no tenderness, with active bowel sounds. No guarding or palpable hepatosplenomegaly  Skin; warm and dry, no jaundice, pale  Recent Labs:  CBC Latest Ref Rng & Units 09/28/2018 09/27/2018 09/27/2018  WBC 4.0 - 10.5 K/uL 20.9(H) - -  Hemoglobin 13.0 - 17.0 g/dL 8.9(L) 8.7(L) 9.1(L)  Hematocrit 39.0 - 52.0 % 27.9(L) 25.8(L) 28.1(L)  Platelets 150 - 400 K/uL 170 - -    Recent Labs  Lab 09/27/18 0234  INR 1.12   CMP Latest Ref Rng & Units 09/28/2018 09/27/2018 09/26/2018  Glucose 70 - 99 mg/dL 136(H) 199(H) 244(H)  BUN 8 - 23 mg/dL 40(H) 56(H) 63(H)  Creatinine 0.61 - 1.24 mg/dL 1.52(H) 1.67(H) 2.04(H)  Sodium 135 - 145 mmol/L 145 146(H) 142  Potassium 3.5 - 5.1 mmol/L 3.8 4.3 4.7  Chloride 98 - 111 mmol/L 114(H) 115(H) 112(H)  CO2 22 - 32 mmol/L 23 24 22   Calcium 8.9 - 10.3 mg/dL 8.0(L) 7.7(L) 7.6(L)  Total Protein 6.5 - 8.1 g/dL 4.3(L) 4.1(L) -  Total Bilirubin 0.3 - 1.2 mg/dL 0.5 0.5 -  Alkaline  Phos 38 - 126 U/L 33(L) 30(L) -  AST 15 - 41 U/L 25 26 -  ALT 0 - 44 U/L 20 21 -    @ASSESSMENTPLANBEGIN @ Assessment: Duodenal ulcer with hemorrhage, began bleeding after receiving heparin and a dose of oral anticoagulation in the setting of new onset atrial fibrillation when he was at [facility] with an acute infectious illness.  He converted quickly to sinus rhythm, which he appears to have remained in during this hospital stay.  Melena  Acute blood loss anemia, now stable since his bleeding has stopped.  Endoscopic therapy on the ulcer was undertaken during yesterday's EGD.  Sepsis from UTI and and community-acquired pneumonia - improved   Plan: Daily CBC  I have discontinued the Protonix drip and started pantoprazole 40 mg oral twice daily.  He should remain on that regimen for 8 weeks.  Whether or not he should continue PPI afterwards depend on his overall clinical situation and whether or not he needs antiplatelet agents or anticoagulation.  Regular diet today.  Follow-up H. pylori serology to see if treatment required.  His family should be asked to go through his medicine cabinets at home and see if there is any aspirin or NSAID-containing preparations that he has been taking.  It sounds like both he and his wife have some degree of memory difficulty.  It is not  clear to me if long-term oral anticoagulation is indicated in this patient.  If so, it should not be resumed until he also has had a chance to heal.  Our service will see this patient tomorrow, and Dr. Havery Moros will be the GI attending on the service.  Total time 25 minutes  Nelida Meuse III Office: [phone]

## 2018-09-29 DIAGNOSIS — E119 Type 2 diabetes mellitus without complications: Secondary | ICD-10-CM

## 2018-09-29 DIAGNOSIS — K921 Melena: Secondary | ICD-10-CM

## 2018-09-29 DIAGNOSIS — I10 Essential (primary) hypertension: Secondary | ICD-10-CM

## 2018-09-29 DIAGNOSIS — R7989 Other specified abnormal findings of blood chemistry: Secondary | ICD-10-CM

## 2018-09-29 DIAGNOSIS — I4891 Unspecified atrial fibrillation: Secondary | ICD-10-CM

## 2018-09-29 DIAGNOSIS — D72829 Elevated white blood cell count, unspecified: Secondary | ICD-10-CM

## 2018-09-29 DIAGNOSIS — K264 Chronic or unspecified duodenal ulcer with hemorrhage: Principal | ICD-10-CM

## 2018-09-29 DIAGNOSIS — Z720 Tobacco use: Secondary | ICD-10-CM

## 2018-09-29 DIAGNOSIS — D62 Acute posthemorrhagic anemia: Secondary | ICD-10-CM

## 2018-09-29 LAB — HEMOGLOBIN AND HEMATOCRIT, BLOOD
HCT: 29.2 % — ABNORMAL LOW (ref 39.0–52.0)
HCT: 27.4 % — ABNORMAL LOW (ref 39.0–52.0)
HEMOGLOBIN: 9.5 g/dL — AB (ref 13.0–17.0)
Hemoglobin: 8.9 g/dL — ABNORMAL LOW (ref 13.0–17.0)

## 2018-09-29 LAB — CBC
HCT: 27.8 % — ABNORMAL LOW (ref 39.0–52.0)
Hemoglobin: 8.6 g/dL — ABNORMAL LOW (ref 13.0–17.0)
MCH: 28.7 pg (ref 26.0–34.0)
MCHC: 30.9 g/dL (ref 30.0–36.0)
MCV: 92.7 fL (ref 80.0–100.0)
Platelets: 196 10*3/uL (ref 150–400)
RBC: 3 MIL/uL — ABNORMAL LOW (ref 4.22–5.81)
RDW: 15.3 % (ref 11.5–15.5)
WBC: 17.9 10*3/uL — AB (ref 4.0–10.5)
nRBC: 0.6 % — ABNORMAL HIGH (ref 0.0–0.2)

## 2018-09-29 LAB — GLUCOSE, CAPILLARY
Glucose-Capillary: 117 mg/dL — ABNORMAL HIGH (ref 70–99)
Glucose-Capillary: 166 mg/dL — ABNORMAL HIGH (ref 70–99)
Glucose-Capillary: 110 mg/dL — ABNORMAL HIGH (ref 70–99)
Glucose-Capillary: 114 mg/dL — ABNORMAL HIGH (ref 70–99)

## 2018-09-29 LAB — H. PYLORI ANTIBODY, IGG: H Pylori IgG: 0.43 Index Value (ref 0.00–0.79)

## 2018-09-29 NOTE — Progress Notes (Signed)
PROGRESS NOTE                                                                                                                                                                                                             Patient Demographics:    Erik Morales, is a 78 y.o. male, DOB - 06/19/1941, ZDG:[id number]  Admit date - 09/25/2018   Admitting Physician Deatra James, MD  Outpatient Primary MD for the patient is Patient, No Pcp Per  LOS - 4  CC - GIB     Brief Narrative  Erik Morales is a 78 y.o. male with medical history significant of diabetes, not on insulin, hypertension on Lasix and ACE inhibitor was initially admitted to [facility] for sepsis with toxic encephalopathy due to pneumonia/UTI.  He then developed A. fib RVR was seen by cardiology, he also had a bump in his troponin was troponin levels around 5.  Cardiology placed him on Eliquis along with some cardiac medications unfortunately at [facility] he developed bright red blood per rectum.  He was then transferred to [facility] on 09/25/2018 for further care as GI coverage was not available at [facility].   Subjective:   Patient in bed, appears comfortable, denies any headache, no fever, no chest pain or pressure, no shortness of breath , no abdominal pain. No focal weakness.   Assessment  & Plan :     1.  Severe upper GI bleed causing acute blood loss related anemia.  Was on Eliquis which was reversed, received 4 units of packed RBC this admission so far, underwent EGD showing a nonbleeding duodenal ulcer with a visible vessel.  IV PPI transition to oral, case discussed with GI physician Dr. Simona Huh on 09/28/2018.  Can resume Eliquis with caution on 10/14/2018 till that time refrain from all anticoagulants and antiplatelet agents.  CBC tomorrow, advance activity, advance diet.  If stable likely discharge tomorrow.  2.  Elevated troponin.  This probably is NSTEMI due to demand ischemia caused by mismatch from  anemia and A. fib RVR mentation.  Due to ongoing GI bleed very limited options.  Rate controlled with amiodarone, blood pressure too low for beta-blocker bleeding hence no antiplatelet agents or heparin.  Seen by cardiology, chest pain-free with stable echocardiogram showing preserved EF and wall motion.  3.  Parox. A. fib with RVR.  Mali vas 2 score of at least 3.  Now been transitioned to amiodarone 200 twice daily, on 10/08/2018 PCP to switch  her to once a day, hold all anticoagulation and antiplatelets due to GI ulcer and bleeding per GI, discussed with Dr. Simona Huh gastroenterologist on 09/28/2018, Eliquis can be started with caution on 10/14/2018.  4.  ARF.  Transfuse, avoid nephrotoxins, stable UA and renal ultrasound  5.  Recently diagnosed sepsis due to pneumonia.  Stable with IV antibiotics, chest x-ray confirms right lower lobe pneumonia, no clinical history suggestive of aspiration, switch to oral antibiotics, encouraged to sit up, minimize oxygen use, check ambulatory pulse ox, possible discharge tomorrow if stable.  6.  Weakness deconditioning.  Patient really eager to go home, will ambulate him in the hallway if he does well home health PT and discharge on 09/30/2018, if not then placement.    7. DM 2 - ISS  CBG (last 3)  Recent Labs    09/28/18 1728 09/28/18 2034 09/29/18 0802  GLUCAP 145* 139* 117*      Family Communication  : Daughter and son-in-law at bedside  Code Status :  Full  Disposition Plan  :  TBD  Consults  :  GI, Cards  Procedures  :    EGD  Impression:      - Widely patent Schatzki ring.                           - Large hiatal hernia.                           - Normal stomach.                           - One non-bleeding duodenal ulcer with a visible                            vessel. Injected. Treated with bipolar cautery.                           - No specimens collected.  Recommendation:                              - Return patient to hospital  [place] for ongoing care.                           - Full liquid diet.                           - Continue present medications, including                            pantoprazole drip.                           - H 8 hour Hgb and Hct x 3 , then further plan to                            follow.                           H pylori serology  No aspirin, antiplatelet agents or anticoagulants.  TTE -   1. The left ventricle has normal systolic function of 44-01%. The cavity size was normal. There is mildly increased left ventricular wall thickness. Echo evidence of impaired diastolic relaxation.  2. The right ventricle has normal systolic function. The cavity was normal. There is no increase in right ventricular wall thickness.  3. Left atrial size was mildly dilated.  4. The mitral valve is normal in structure.  5. The tricuspid valve is normal in structure.  6. The aortic valve is normal in structure. There is mild thickening and mild calcification of the aortic valve.  7. The pulmonic valve was normal in structure.  8. The aortic root and ascending aorta are normal in size and structure.  DVT Prophylaxis  :  SCDs    Lab Results  Component Value Date   PLT 196 09/29/2018    Diet :  Diet Order            Diet Carb Modified Fluid consistency: Thin; Room service appropriate? Yes  Diet effective now               Inpatient Medications Scheduled Meds: . amiodarone  200 mg Oral BID  . amoxicillin-clavulanate  1 tablet Oral Q12H  . insulin aspart  0-9 Units Subcutaneous Q4H  . pantoprazole  40 mg Oral BID AC   Continuous Infusions:  PRN Meds:.acetaminophen **OR** [DISCONTINUED] acetaminophen, guaiFENesin-dextromethorphan  Antibiotics  :   Anti-infectives (From admission, onward)   Start     Dose/Rate Route Frequency Ordered Stop   09/28/18 1000  amoxicillin-clavulanate (AUGMENTIN) 875-125 MG per tablet 1 tablet     1 tablet Oral Every 12 hours 09/28/18  0940     09/25/18 2230  cefTRIAXone (ROCEPHIN) 2 g in sodium chloride 0.9 % 100 mL IVPB  Status:  Discontinued     2 g 200 mL/hr over 30 Minutes Intravenous Every 24 hours 09/25/18 2210 09/28/18 0940   09/25/18 2230  azithromycin (ZITHROMAX) 500 mg in sodium chloride 0.9 % 250 mL IVPB  Status:  Discontinued     500 mg 250 mL/hr over 60 Minutes Intravenous Every 24 hours 09/25/18 2210 09/28/18 0940        Objective:   Vitals:   09/28/18 0646 09/28/18 1213 09/28/18 2036 09/29/18 0456  BP: (!) 109/54 (!) 120/58 (!) 120/59 (!) 115/57  Pulse: 72 78 79 71  Resp: (!) 22 (!) 21 (!) 24 18  Temp:  98.9 F (37.2 C) 98.7 F (37.1 C) 97.9 F (36.6 C)  TempSrc:  Oral Oral Oral  SpO2: 97% 98% 97% 98%  Weight:      Height:        Wt Readings from Last 3 Encounters:  09/26/18 112 kg     Intake/Output Summary (Last 24 hours) at 09/29/2018 1024 Last data filed at 09/29/2018 0755 Gross per 24 hour  Intake 737.82 ml  Output 1570 ml  Net -832.18 ml     Physical Exam  Awake Alert, Oriented X 3, No new F.N deficits, Normal affect [place].AT,PERRAL Supple Neck,No JVD, No cervical lymphadenopathy appriciated.  Symmetrical Chest wall movement, Good air movement bilaterally, CTAB RRR,No Gallops, Rubs or new Murmurs, No Parasternal Heave +ve B.Sounds, Abd Soft, No tenderness, No organomegaly appriciated, No rebound - guarding or rigidity. No Cyanosis, Clubbing or edema, No new Rash or bruise    Data Review:    CBC Recent Labs  Lab 09/25/18 2236 09/26/18 0522  09/27/18 0234 09/27/18  1533 09/27/18 1813 09/27/18 2121 09/28/18 0355 09/29/18 0354  WBC 24.8* 29.5*  --  24.5*  --   --   --  20.9* 17.9*  HGB 8.2* 7.1*   < > 7.6* 9.1* 9.1* 8.7* 8.9* 8.6*  HCT 25.4* 21.4*   < > 22.4* 27.1* 28.1* 25.8* 27.9* 27.8*  PLT 195 211  --  176  --   --   --  170 196  MCV 87.6 88.4  --  88.5  --   --   --  91.2 92.7  MCH 28.3 29.3  --  30.0  --   --   --  29.1 28.7  MCHC 32.3 33.2  --  33.9  --   --    --  31.9 30.9  RDW 14.4 15.0  --  14.7  --   --   --  15.0 15.3   < > = values in this interval not displayed.    Chemistries  Recent Labs  Lab 09/25/18 2236 09/26/18 0522 09/27/18 0234 09/28/18 0355  NA 142 142 146* 145  K 4.2 4.7 4.3 3.8  CL 111 112* 115* 114*  CO2 24 22 24 23   GLUCOSE 247* 244* 199* 136*  BUN 56* 63* 56* 40*  CREATININE 1.86* 2.04* 1.67* 1.52*  CALCIUM 7.5* 7.6* 7.7* 8.0*  MG  --  1.8 1.9 2.0  AST 56*  --  26 25  ALT 25  --  21 20  ALKPHOS 35*  --  30* 33*  BILITOT 0.4  --  0.5 0.5   ------------------------------------------------------------------------------------------------------------------ Recent Labs    09/27/18 0234  CHOL 72  HDL 19*  LDLCALC 26  TRIG 134  CHOLHDL 3.8    No results found for: HGBA1C ------------------------------------------------------------------------------------------------------------------ Recent Labs    09/26/18 1827  TSH 0.430   ------------------------------------------------------------------------------------------------------------------ No results for input(s): VITAMINB12, FOLATE, FERRITIN, TIBC, IRON, RETICCTPCT in the last 72 hours.  Coagulation profile Recent Labs  Lab 09/25/18 2236 09/27/18 0234  INR 1.42 1.12    No results for input(s): DDIMER in the last 72 hours.  Cardiac Enzymes Recent Labs  Lab 09/25/18 2235 09/26/18 0522  TROPONINI 15.74* 10.23*   ------------------------------------------------------------------------------------------------------------------ No results found for: BNP  Micro Results No results found for this or any previous visit (from the past 240 hour(s)).  Radiology Reports Dg Chest 2 View  Result Date: 09/27/2018 CLINICAL DATA:  Toxic encephalopathy, pneumonia, hypertension, type II diabetes mellitus EXAM: CHEST - 2 VIEW COMPARISON:  09/24/2018 FINDINGS: Upper normal heart size. Atherosclerotic calcification aorta. Mediastinal contours and pulmonary  vascularity normal. Questionable RIGHT basilar infiltrate. Remaining lungs clear. Small pleural effusions blunt the posterior costophrenic angles. No pneumothorax. Endplate spur formation thoracic spine. IMPRESSION: Small BILATERAL pleural effusions and questionable RIGHT lower lobe infiltrate. Electronically Signed   By: Lavonia Dana M.D.   On: 09/27/2018 18:46   US Renal  Result Date: 09/26/2018 CLINICAL DATA:  Acute renal failure. EXAM: RENAL / URINARY TRACT ULTRASOUND COMPLETE COMPARISON:  Renal ultrasound, 09/22/2018 FINDINGS: Right Kidney: Renal measurements: 9.9 x 5.3 x 6.0 cm = volume: 164 mL. Mildly increased parenchymal echogenicity. Diffuse renal cortical thinning. No masses, stones or hydronephrosis. Left Kidney: Renal measurements: 10.2 x 5.9 x 6.2 cm = volume: 195 mL. Borderline increased parenchymal echogenicity. Diffuse renal cortical thinning. No masses, stones or hydronephrosis. Bladder: Decompressed with a Foley catheter. IMPRESSION: 1. No acute findings.  No hydronephrosis. 2. Bilateral renal cortical thinning. Increased right and borderline increased left renal parenchymal echogenicity. Findings  consistent with medical renal disease. Electronically Signed   By: Lajean Manes M.D.   On: 09/26/2018 16:28    Time Spent in minutes  30   Lala Lund M.D on 09/29/2018 at 10:24 AM  To page go to [email or web address] - password [facility]

## 2018-09-29 NOTE — Consult Note (Signed)
            [facility] CM Primary Care Navigator  09/29/2018  ROLEN CONGER [birth year] [id number]   Seen patientat the bedside to identify possible discharge needs.  Per MD note, patientwas [facility] sepsis with toxic encephalopathy due to pneumonia/ UTI, then he developed atrial fibrillation with rapid ventricular response and was seen by cardiology. He also developed bright red blood per rectum, and was then transferred to [facility] for further care and management.  Patient endorsesDr.Nina Uppin with [facility] Internal Medicine ashisprimary careprovider.   [facility] [place] to [facility] medicationswithout any problem.  Patientstates that wife Kipp Laurence been managingmedications for him at home straight out of the containers.  Patientmentioned that he has been driving prior to admission but wife or brother Nadara Mustard) will be able to provide transportation to his doctors' appointments when neededafter discharge.  Patient reports that he has been independent with self care, but wife will be able to serve as his primary caregiver at home once discharge.   Anticipated discharge [place] per therapy recommendation.  Patientexpressedunderstanding to call primary care provider's office when he gets homefor a post discharge follow-upwithin1- 2 weeksor sooner if needs arise. Patient letter (with PCP's contact number) was provided as a reminder.  Explained to patientabout [facility] CM services available for health managementand resources but heindicated having no pressing issues or needs for now. He states that wife is there to assist him with health needs when he needs it. Patientwas encouraged to discuss with primary care provideronhis next visit, for further needsand assistancein managing his health conditionsoncedischarge to home.  Patientvoicedunderstanding to  seekreferral to [facility] care management from primary care provider if deemed necessary and appropriateforanyservicesin the nearfuture.   [facility] care management information provided for future needs that he may have.  Primary care provider's office is listed as providing transition of care (TOC).     For additional questions please contact:  Edwena Felty A. Delise Simenson, BSN, RN-BC [facility] PRIMARY CARE Navigator Cell: [phone]

## 2018-09-29 NOTE — NC FL2 (Signed)
[place] LEVEL OF CARE SCREENING TOOL     IDENTIFICATION  Patient Name: Erik Morales Birthdate: 07-16-1941 Sex: male Admission Date (Current Location): 09/25/2018  [facility] and [place] Number:  [employer] and Address:  [facility] [place] [address] [place], [place] [address]      Provider Number: [id number]  Attending Physician Name and Address:  Thurnell Lose, MD  Relative Name and Phone Number:  Janett Billow granddaughter, [phone]    Current Level of Care: Hospital Recommended Level of Care: [place] Prior Approval Number:    Date Approved/Denied:   PASRR Number: [id number]  Discharge Plan: SNF    Current Diagnoses: Patient Active Problem List   Diagnosis Date Noted  . Duodenal ulcer with hemorrhage   . Melena   . Acute blood loss anemia   . Pressure injury of skin 09/26/2018  . Hematochezia 09/25/2018  . AKI (acute kidney injury) ([place]) 09/25/2018  . Severe sepsis ([place]) 09/25/2018  . Atrial fibrillation with RVR ([place]) 09/25/2018  . Elevated troponin 09/25/2018  . Abnormal thyroid function test 09/25/2018  . DM2 (diabetes mellitus, type 2) ([place]) 09/25/2018  . HTN (hypertension) 09/25/2018  . E-coli UTI 09/25/2018  . CAP (community acquired pneumonia) 09/25/2018    Orientation RESPIRATION BLADDER Height & Weight     Self, Time, Situation, Place  Normal Continent, Indwelling catheter Weight: 112 kg Height:  5\' 10"  (177.8 cm)  BEHAVIORAL SYMPTOMS/MOOD NEUROLOGICAL BOWEL NUTRITION STATUS      Continent Diet(Please see DC Summary)  AMBULATORY STATUS COMMUNICATION OF NEEDS Skin   Limited Assist Verbally PU Stage and Appropriate Care(Stage II on buttocks)                       Personal Care Assistance Level of Assistance  Bathing, Feeding, Dressing Bathing Assistance: Limited assistance Feeding assistance: Independent Dressing Assistance: Limited assistance     Functional Limitations  Info  Sight, Hearing, Speech Sight Info: Adequate Hearing Info: Adequate Speech Info: Adequate    SPECIAL CARE FACTORS FREQUENCY  PT (By licensed PT), OT (By licensed OT)     PT Frequency: 5x/week OT Frequency: 3x/week            Contractures Contractures Info: Not present    Additional Factors Info  Code Status, Allergies, Insulin Sliding Scale Code Status Info: Full Allergies Info: NKA   Insulin Sliding Scale Info: Every 4 hours       Current Medications (09/29/2018):  This is the current hospital active medication list Current Facility-Administered Medications  Medication Dose Route Frequency Provider Last Rate Last Dose  . acetaminophen (TYLENOL) tablet 650 mg  650 mg Oral Q6H PRN Nelida Meuse III, MD      . amiodarone (PACERONE) tablet 200 mg  200 mg Oral BID Thurnell Lose, MD   200 mg at 09/29/18 0955  . amoxicillin-clavulanate (AUGMENTIN) 875-125 MG per tablet 1 tablet  1 tablet Oral Q12H Thurnell Lose, MD   1 tablet at 09/29/18 0955  . guaiFENesin-dextromethorphan (ROBITUSSIN DM) 100-10 MG/5ML syrup 5 mL  5 mL Oral Q4H PRN Thurnell Lose, MD   5 mL at 09/28/18 2045  . insulin aspart (novoLOG) injection 0-9 Units  0-9 Units Subcutaneous Q4H Nelida Meuse III, MD   1 Units at 09/28/18 2046  . pantoprazole (PROTONIX) EC tablet 40 mg  40 mg Oral BID AC Danis, Estill Cotta III, MD   40 mg at  09/29/18 0955     Discharge Medications: Please see discharge summary for a list of discharge medications.  Relevant Imaging Results:  Relevant Lab Results:   Additional Information SSN: [place], [place]

## 2018-09-29 NOTE — Clinical Social Work Note (Signed)
Clinical Social Work Assessment  Patient Details  Name: Erik Morales MRN: [id number] Date of Birth: 1941-06-27  Date of referral:  09/29/18               Reason for consult:  Facility Placement                Permission sought to share information with:  Facility Sport and exercise psychologist, Family Supports Permission granted to share information::  Yes, Verbal Permission Granted  Name::     Medical illustrator::  SNFs  Relationship::  Granddaughter  Contact Information:  [phone]  Housing/Transportation Living arrangements for the past 2 months:  Single Family Home Source of Information:  Patient, Other (Comment Required) Patient Interpreter Needed:  None Criminal Activity/Legal Involvement Pertinent to Current Situation/Hospitalization:  No - Comment as needed Significant Relationships:  Other Family Members Lives with:  Self Do you feel safe going back to the place where you live?  No Need for family participation in patient care:  Yes (Comment)  Care giving concerns:  CSW received consult for possible SNF placement at time of discharge. CSW spoke with patient and granddaughter at bedside regarding PT recommendation of SNF placement at time of discharge if CIR is unable to accept patient. Patient reported that patient's family is currently unable to care for patient at their home given patient's current physical needs and fall risk. Patient expressed understanding of PT recommendation and is agreeable to SNF placement at time of discharge if insurance denies CIR. CSW to continue to follow and assist with discharge planning needs.   Social Worker assessment / plan:  CSW spoke with patient and granddaughter concerning possibility of [facility] before returning home.  Employment status:  Retired Nurse, adult PT Recommendations:  [place] / Referral to community resources:  [place]  Patient/Family's Response to care:   Patient recognizes need for rehab before returning home and is agreeable to CIR or a SNF in [place]. CSW explained CIR process and need for insurance approval. CSW provided SNF list as a second option. Patient reported that he is eager to go home and work on his [place].   Patient/Family's Understanding of and Emotional Response to Diagnosis, Current Treatment, and Prognosis:  Patient/family is realistic regarding therapy needs and expressed being hopeful for SNF placement. Patient expressed understanding of CSW role and discharge process as well as medical condition. No questions/concerns about plan or treatment.    Emotional Assessment Appearance:  Appears stated age Attitude/Demeanor/Rapport:  Engaged, Gracious Affect (typically observed):  Accepting, Appropriate Orientation:  Oriented to Self, Oriented to Place, Oriented to  Time, Oriented to Situation Alcohol / Substance use:  Not Applicable Psych involvement (Current and /or in the community):  No (Comment)  Discharge Needs  Concerns to be addressed:  Care Coordination Readmission within the last 30 days:  No Current discharge risk:  None Barriers to Discharge:  Continued Medical Work up   [employer], LCSW 09/29/2018, 3:44 PM

## 2018-09-29 NOTE — Consult Note (Signed)
Physical Medicine and Rehabilitation Consult Reason for Consult:  Decreased functional mobility Referring Physician: [facility]   HPI: Erik Morales is a 78 y.o.right handed male with history of type 2 diabetes mellitus, hyperlipidemia, hypertension as well as history of tobacco use. Per chart review, granddaughter, and patient, patient lives with spouse. Mobile home with 5 steps to entry. Independent prior to admission.Presented to [facility] 09/25/2018 with altered mental status. By report patient was found in a ditch by his wife had been there for an unknown period of time. Troponin 15.74,creatinine 1.86, BUN 56,WBC 24,800, hemoglobin 8.2.. Patient developed atrial fibrillation with RVR and placed on Eliquis per cardiology services and transferred to [facility]. Chest x-ray showed small bilateral pleural effusions and questionable right lower lobe infiltrate. Renal ultrasound no hydronephrosis. Suspected GI bleed with noted hemoglobin 8.2 receive 4 units of packed red blood cells and his Eliquis was reversed. Underwent EGD showing nonbleeding duodenal ulcer with a visible vessel. Placed on PPI. Recommendations are to resume Eliquis 10/14/2018. Elevated troponin most likely secondary NSTEMI due to demand ischemia. Cardiac rate control with amiodarone. Patient completing course of IV antibiotics for sepsis due to pneumonia. Tolerating a regular consistency diet. Therapy evaluations completed with recommendations of physical medicine rehabilitation consult.  Review of Systems  Constitutional: Negative for chills and fever.  HENT: Negative for hearing loss.   Eyes: Negative for blurred vision and double vision.  Respiratory: Positive for cough and shortness of breath.   Cardiovascular: Negative for chest pain and leg swelling.  Gastrointestinal: Positive for constipation. Negative for nausea.  Genitourinary: Positive for urgency. Negative for dysuria, flank pain and hematuria.    Musculoskeletal: Positive for myalgias.  Skin: Negative for rash.  All other systems reviewed and are negative.  Past Medical History:  Diagnosis Date  . DM2 (diabetes mellitus, type 2) ([place])   . HLD (hyperlipidemia)   . HTN (hypertension)    History reviewed. No pertinent surgical history. Family History  Problem Relation Age of Onset  . Diabetes Son    Social History:  reports that he has quit smoking. He does not have any smokeless tobacco history on file. He reports that he does not drink alcohol or use drugs. Allergies: No Known Allergies Medications Prior to Admission  Medication Sig Dispense Refill  . acetaminophen (TYLENOL) 500 MG tablet Take 500 mg by mouth every 6 (six) hours as needed for headache (pain).    Marland Kitchen atenolol (TENORMIN) 100 MG tablet Take 100 mg by mouth daily.    . calcium elemental as carbonate (BARIATRIC TUMS ULTRA) 400 MG chewable tablet Chew 1,000 mg by mouth 3 (three) times daily as needed for heartburn (indigestion).    . furosemide (LASIX) 20 MG tablet Take 20 mg by mouth 2 (two) times daily.    Marland Kitchen glimepiride (AMARYL) 2 MG tablet Take 2 mg by mouth daily.    Marland Kitchen latanoprost (XALATAN) 0.005 % ophthalmic solution Place 1 drop into both eyes at bedtime.    Marland Kitchen lisinopril (PRINIVIL,ZESTRIL) 20 MG tablet Take 20 mg by mouth daily.    . montelukast (SINGULAIR) 10 MG tablet Take 10 mg by mouth at bedtime as needed (allergies).    . Multiple Vitamin (MULTIVITAMIN WITH MINERALS) TABS tablet Take 1 tablet by mouth daily. Centrum Silver    . pioglitazone (ACTOS) 30 MG tablet Take 30 mg by mouth daily.    . potassium chloride (K-DUR) 10 MEQ tablet Take 10 mEq by mouth daily.    Marland Kitchen  pravastatin (PRAVACHOL) 20 MG tablet Take 20 mg by mouth daily with supper.    . terazosin (HYTRIN) 5 MG capsule Take 5 mg by mouth daily with supper.    . Vitamin D, Ergocalciferol, (DRISDOL) 1.25 MG (50000 UT) CAPS capsule Take 50,000 Units by mouth every Wednesday.      Home: Home  Living Family/patient expects to be discharged to:: Private residence Living Arrangements: Spouse/significant other Available Help at Discharge: Family, Available 24 hours/day Type of Home: Mobile home Home Access: Stairs to enter [employer] of Steps: 5 Entrance Stairs-Rails: Right, Left Home Layout: One level Bathroom Toilet: Standard Home Equipment: None  Functional History: Prior Function Level of Independence: Independent Functional Status:  Mobility: Bed Mobility Overal bed mobility: Needs Assistance Bed Mobility: Rolling, Supine to Sit, Sit to Supine Rolling: Min assist Supine to sit: Mod assist, HOB elevated Sit to supine: Mod assist, HOB elevated General bed mobility comments: +rail, increased time and effort, cues for sequencing Transfers Overall transfer level: Needs assistance Equipment used: Rolling walker (2 wheeled) Transfers: Sit to/from Stand Sit to Stand: Mod assist General transfer comment: cues for hand placement, increased time and effort, assist to power up and stabilize initial standing balance Ambulation/Gait Ambulation/Gait assistance: Mod assist Gait Distance (Feet): 5 Feet Assistive device: Rolling walker (2 wheeled) Gait Pattern/deviations: Step-through pattern, Wide base of support, Decreased stride length General Gait Details: Gait distance limited by weakness, bilat knee buckling Gait velocity: decreased    ADL:    Cognition: Cognition Overall Cognitive Status: No family/caregiver present to determine baseline cognitive functioning Orientation Level: Oriented X4 Cognition Arousal/Alertness: Awake/alert Overall Cognitive Status: No family/caregiver present to determine baseline cognitive functioning General Comments: A&O x 3. Answers questions appropriately. Follows commands. Decreased safety awareness. Does report that he was kidnapped from the ditch and brought to the hospital when all he needed was help getting his lawnmower  unstuck.   Blood pressure (!) 115/57, pulse 71, temperature 97.9 F (36.6 C), temperature source Oral, resp. rate 18, height 5\' 10"  (1.778 m), weight 112 kg, SpO2 98 %. Physical Exam  Vitals reviewed. Constitutional: He is oriented to person, place, and time. He appears well-developed.  Obese  HENT:  Head: Normocephalic and atraumatic.  Eyes: EOM are normal. Right eye exhibits no discharge. Left eye exhibits no discharge.  Neck: Normal range of motion. Neck supple. No thyromegaly present.  Cardiovascular: Normal rate and regular rhythm.  Respiratory: Effort normal and breath sounds normal. No respiratory distress.  GI: Soft. Bowel sounds are normal. He exhibits no distension.  Musculoskeletal:     Comments: Generalized edema  Neurological: He is alert and oriented to person, place, and time.  Motor: Bilateral upper extremities: 4--4/5 proximal distal Right lower extremity: Hip flexion 4-/5, knee extension 4/5, ankle dorsiflexion 4/5 Left lower extremity: Hip flexion 3-/5, knee extension 4-/5, ankle dorsiflexion 4-/5  Skin: Skin is warm and dry.  Scattered bruising  Psychiatric: His behavior is normal. His affect is blunt.    Results for orders placed or performed during the hospital encounter of 09/25/18 (from the past 24 hour(s))  Glucose, capillary     Status: Abnormal   Collection Time: 09/28/18 12:01 PM  Result Value Ref Range   Glucose-Capillary 154 (H) 70 - 99 mg/dL  Glucose, capillary     Status: Abnormal   Collection Time: 09/28/18  5:28 PM  Result Value Ref Range   Glucose-Capillary 145 (H) 70 - 99 mg/dL  Glucose, capillary     Status: Abnormal  Collection Time: 09/28/18  8:34 PM  Result Value Ref Range   Glucose-Capillary 139 (H) 70 - 99 mg/dL  CBC     Status: Abnormal   Collection Time: 09/29/18  3:54 AM  Result Value Ref Range   WBC 17.9 (H) 4.0 - 10.5 K/uL   RBC 3.00 (L) 4.22 - 5.81 MIL/uL   Hemoglobin 8.6 (L) 13.0 - 17.0 g/dL   HCT 27.8 (L) 39.0 - 52.0 %    MCV 92.7 80.0 - 100.0 fL   MCH 28.7 26.0 - 34.0 pg   MCHC 30.9 30.0 - 36.0 g/dL   RDW 15.3 11.5 - 15.5 %   Platelets 196 150 - 400 K/uL   nRBC 0.6 (H) 0.0 - 0.2 %  Glucose, capillary     Status: Abnormal   Collection Time: 09/29/18  8:02 AM  Result Value Ref Range   Glucose-Capillary 117 (H) 70 - 99 mg/dL   Dg Chest 2 View  Result Date: 09/27/2018 CLINICAL DATA:  Toxic encephalopathy, pneumonia, hypertension, type II diabetes mellitus EXAM: CHEST - 2 VIEW COMPARISON:  09/24/2018 FINDINGS: Upper normal heart size. Atherosclerotic calcification aorta. Mediastinal contours and pulmonary vascularity normal. Questionable RIGHT basilar infiltrate. Remaining lungs clear. Small pleural effusions blunt the posterior costophrenic angles. No pneumothorax. Endplate spur formation thoracic spine. IMPRESSION: Small BILATERAL pleural effusions and questionable RIGHT lower lobe infiltrate. Electronically Signed   By: Lavonia Dana M.D.   On: 09/27/2018 18:46    Assessment/Plan: Diagnosis: Debility Labs independently reviewed.  Records reviewed and summated above.  1. Does the need for close, 24 hr/day medical supervision in concert with the patient's rehab needs make it unreasonable for this patient to be served in a less intensive setting? Yes  2. Co-Morbidities requiring supervision/potential complications: NSTEMI (cont meds), Suspected GI bleed (repeat labs, transfuse again if necessary to ensure appropriate perfusion for increased activity tolerance), atrial fibrillation with RVR (continue meds, monitor heart rate with increased physical activity),  type 2 DM (Monitor in accordance with exercise and adjust meds as necessary), hyperlipidemia, HTN (monitor and provide prns in accordance with increased physical exertion and pain), tobacco use (counsel), leukocytosis (repeat labs, cont to monitor for signs and symptoms of infection, further workup if indicated) 3. Due to safety, disease management and patient  education, does the patient require 24 hr/day rehab nursing? Yes 4. Does the patient require coordinated care of a physician, rehab nurse, PT (1-2 hrs/day, 5 days/week) and OT (1-2 hrs/day, 5 days/week) to address physical and functional deficits in the context of the above medical diagnosis(es)? Yes Addressing deficits in the following areas: balance, endurance, locomotion, strength, transferring, bathing, dressing, toileting and psychosocial support 5. Can the patient actively participate in an intensive therapy program of at least 3 hrs of therapy per day at least 5 days per week? Yes 6. The potential for patient to make measurable gains while on inpatient rehab is excellent 7. Anticipated functional outcomes upon discharge from inpatient rehab are supervision and min assist  with PT, supervision and min assist with OT, n/a with SLP. 8. Estimated rehab length of stay to reach the above functional goals is: 14 to 17 days. 9. Anticipated D/C setting: Home 10. Anticipated post D/C treatments: HH therapy and Home excercise program 11. Overall Rehab/Functional Prognosis: excellent and good  RECOMMENDATIONS: This patient's condition is appropriate for continued rehabilitative care in the following setting: CIR Patient has agreed to participate in recommended program. Yes Note that insurance prior authorization may be required for reimbursement for  recommended care.  Comment: Rehab Admissions Coordinator to follow up.   I have personally performed a face to face diagnostic evaluation, including, but not limited to relevant history and physical exam findings, of this patient and developed relevant assessment and plan.  Additionally, I have reviewed and concur with the physician assistant's documentation above.   Delice Lesch, MD, ABPMR Lavon Paganini Angiulli, PA-C 09/29/2018

## 2018-09-29 NOTE — Progress Notes (Signed)
    Progress Note   Subjective  Chief Complaint: melena  No melena since 09/28/18. Denies abdominal pain, ate a regular dinner and breakfast, no new complaints.   Objective   Vital signs in last 24 hours: Temp:  [97.9 F (36.6 C)-98.9 F (37.2 C)] 97.9 F (36.6 C) (02/10 0456) Pulse Rate:  [71-79] 71 (02/10 0456) Resp:  [18-24] 18 (02/10 0456) BP: (115-120)/(57-59) 115/57 (02/10 0456) SpO2:  [97 %-98 %] 98 % (02/10 0456) Last BM Date: 09/26/18 General:    white male in NAD Heart:  Regular rate and rhythm; no murmurs Lungs: Respirations even and unlabored, lungs CTA bilaterally Abdomen:  Soft, nontender and nondistended. Normal bowel sounds. Extremities:  Without edema. Neurologic:  Alert and oriented,  grossly normal neurologically. Psych:  Cooperative. Normal mood and affect.  Intake/Output from previous day: 02/09 0701 - 02/10 0700 In: 857.8 [P.O.:120; I.V.:737.8] Out: 1850 [Urine:1850] Intake/Output this shift: Total I/O In: -  Out: 320 [Urine:320]  Lab Results: Recent Labs    09/27/18 0234  09/27/18 2121 09/28/18 0355 09/29/18 0354  WBC 24.5*  --   --  20.9* 17.9*  HGB 7.6*   < > 8.7* 8.9* 8.6*  HCT 22.4*   < > 25.8* 27.9* 27.8*  PLT 176  --   --  170 196   < > = values in this interval not displayed.   BMET Recent Labs    09/27/18 0234 09/28/18 0355  NA 146* 145  K 4.3 3.8  CL 115* 114*  CO2 24 23  GLUCOSE 199* 136*  BUN 56* 40*  CREATININE 1.67* 1.52*  CALCIUM 7.7* 8.0*   LFT Recent Labs    09/28/18 0355  PROT 4.3*  ALBUMIN 2.0*  AST 25  ALT 20  ALKPHOS 33*  BILITOT 0.5   PT/INR Recent Labs    09/27/18 0234  LABPROT 14.3  INR 1.12    Studies/Results: Dg Chest 2 View  Result Date: 09/27/2018 CLINICAL DATA:  Toxic encephalopathy, pneumonia, hypertension, type II diabetes mellitus EXAM: CHEST - 2 VIEW COMPARISON:  09/24/2018 FINDINGS: Upper normal heart size. Atherosclerotic calcification aorta. Mediastinal contours and pulmonary  vascularity normal. Questionable RIGHT basilar infiltrate. Remaining lungs clear. Small pleural effusions blunt the posterior costophrenic angles. No pneumothorax. Endplate spur formation thoracic spine. IMPRESSION: Small BILATERAL pleural effusions and questionable RIGHT lower lobe infiltrate. Electronically Signed   By: Lavonia Dana M.D.   On: 09/27/2018 18:46       Assessment / Plan:   Assessment: 1. Duodenal Ulcer with hemorrhage: began bleeding after receiving Heparin and a dose of oral anticoagulation in setting of new AFib at [facility], EGD with duodenal ulcer 2. Melena: with above, none in past 12 hours 3. ABLA: with above, hgb stable  Plan: 1. Continue to monitor hgb during hospitalization with transfusion as needed <7.  2. Continue BID PPI 3. Patient will need to follow up outpatient for recheck hgb with PCP in 1-2 weeks.  [place] with patient discharge from GI standpoint. 5. Please await any further recs from Dr. Havery Moros later today  Thank you for your kind consultation, we will sign off.    LOS: 4 days   Levin Erp  09/29/2018, 11:05 AM

## 2018-09-29 NOTE — Progress Notes (Signed)
Physical Therapy Treatment Patient Details Name: Erik Morales MRN: [id number] DOB: [birth year] Today's Date: 09/29/2018    History of Present Illness Pt is a 78 y.o. male with a hx of DM, HTN, and HLD. He was admitted to [facility] for several days after presenting with AMS and hypothermia, found to have sepsis with UTI/PNA, acute kidney injury with Cr 3.3, increased troponin, anemia, and new onset PAF. He was apparently found in a ditch after having been outside for an unknown period of time. The patient states he had ridden his lawnmower into the ditch and had to try and flag someone down to help him. He was reported to be confused with altered mental status upon arrival to [facility] ER. He was transferred to [facility] after developing GIB with worsening anemia on anticoagulation.      PT Comments    Pt performed gait training progression and participation in LE strengthening activities.  Plan next session for continued strengthening.  Pt grossly required moderate assistance for transfers and gt training.  Pt continues to benefit from aggressive rehab in a post acute setting.  After functional mobility today he is aware that his care needs cannot be met at home.  Pt presented with posterior LOB after B knees buckled.  He remains an excellent candidate for CIR therapies.      Follow Up Recommendations  CIR;Supervision/Assistance - 24 hour     Equipment Recommendations  Rolling walker with 5" wheels    Recommendations for Other Services       Precautions / Restrictions Precautions Precautions: Fall Restrictions Weight Bearing Restrictions: No    Mobility  Bed Mobility Overal bed mobility: Needs Assistance Bed Mobility: Rolling;Sidelying to Sit Rolling: Min assist Sidelying to sit: Mod assist       General bed mobility comments: Pt required use of railing and moderate assistance to elevate trunk into sitting.    Transfers Overall transfer level: Needs  assistance Equipment used: Rolling walker (2 wheeled) Transfers: Sit to/from Stand Sit to Stand: Mod assist;+2 safety/equipment         General transfer comment: Cues for hand placement to and from seated surface.  Pt performed multiple trials during session.  Pt presents with LOB posterior and B buckling in B knees returning quickly to recliner chair.    Ambulation/Gait Ambulation/Gait assistance: Mod assist;+2 safety/equipment Gait Distance (Feet): 60 Feet(+ 20 ft + 10 ft) Assistive device: Rolling walker (2 wheeled) Gait Pattern/deviations: Wide base of support;Decreased stride length;Decreased dorsiflexion - right;Decreased dorsiflexion - left;Step-to pattern Gait velocity: decreased   General Gait Details: Gait distance continues to be limited by weakness, bilat knee buckling.  Cues for sequencing and B knee extension in stance phase.     Stairs             Wheelchair Mobility    Modified Rankin (Stroke Patients Only)       Balance Overall balance assessment: Needs assistance   Sitting balance-Leahy Scale: Fair       Standing balance-Leahy Scale: Poor Standing balance comment: reliant on RW                             Cognition Arousal/Alertness: Awake/alert Behavior During Therapy: WFL for tasks assessed/performed Overall Cognitive Status: Within Functional Limits for tasks assessed(appears to be within functional limits but not formally assessed.  )  Exercises General Exercises - Lower Extremity Ankle Circles/Pumps: AROM;Both;10 reps;Supine Quad Sets: AROM;10 reps;Both;Supine Heel Slides: AROM;Both;10 reps;Supine Hip ABduction/ADduction: AROM;Both;10 reps;Supine    General Comments        Pertinent Vitals/Pain Pain Assessment: Faces Faces Pain Scale: Hurts even more Pain Location: R ankle/foot Pain Descriptors / Indicators: Guarding;Grimacing Pain Intervention(s): Monitored  during session;Repositioned    Home Living                      Prior Function            PT Goals (current goals can now be found in the care plan section) Acute Rehab PT Goals Patient Stated Goal: "To go home eventually." Potential to Achieve Goals: Good Progress towards PT goals: Progressing toward goals    Frequency    Min 3X/week      PT Plan Current plan remains appropriate    Co-evaluation              AM-PAC PT "6 Clicks" Mobility   Outcome Measure  Help needed turning from your back to your side while in a flat bed without using bedrails?: A Lot Help needed moving from lying on your back to sitting on the side of a flat bed without using bedrails?: A Lot Help needed moving to and from a bed to a chair (including a wheelchair)?: A Lot Help needed standing up from a chair using your arms (e.g., wheelchair or bedside chair)?: A Lot Help needed to walk in hospital room?: A Lot Help needed climbing 3-5 steps with a railing? : A Lot 6 Click Score: 12    End of Session Equipment Utilized During Treatment: Gait belt Activity Tolerance: Patient tolerated treatment well Patient left: in bed;with call bell/phone within reach;with bed alarm set Nurse Communication: Mobility status PT Visit Diagnosis: Muscle weakness (generalized) (M62.81);Difficulty in walking, not elsewhere classified (R26.2);Pain Pain - Right/Left: Right Pain - part of body: Ankle and joints of foot     Time: 1011-1053 PT Time Calculation (min) (ACUTE ONLY): 42 min  Charges:  $Gait Training: 23-37 mins $Therapeutic Exercise: 8-22 mins                     Erik Morales, PTA Acute Rehabilitation Services Pager [phone] Office [phone]     Erik Morales Erik Morales 09/29/2018, 11:26 AM

## 2018-09-30 ENCOUNTER — Encounter (HOSPITAL_COMMUNITY): Payer: Self-pay | Admitting: Gastroenterology

## 2018-09-30 LAB — GLUCOSE, CAPILLARY
GLUCOSE-CAPILLARY: 105 mg/dL — AB (ref 70–99)
GLUCOSE-CAPILLARY: 118 mg/dL — AB (ref 70–99)
Glucose-Capillary: 109 mg/dL — ABNORMAL HIGH (ref 70–99)
Glucose-Capillary: 128 mg/dL — ABNORMAL HIGH (ref 70–99)
Glucose-Capillary: 143 mg/dL — ABNORMAL HIGH (ref 70–99)
Glucose-Capillary: 160 mg/dL — ABNORMAL HIGH (ref 70–99)

## 2018-09-30 LAB — CBC
HCT: 25.3 % — ABNORMAL LOW (ref 39.0–52.0)
Hemoglobin: 8.2 g/dL — ABNORMAL LOW (ref 13.0–17.0)
MCH: 30.5 pg (ref 26.0–34.0)
MCHC: 32.4 g/dL (ref 30.0–36.0)
MCV: 94.1 fL (ref 80.0–100.0)
Platelets: 211 10*3/uL (ref 150–400)
RBC: 2.69 MIL/uL — AB (ref 4.22–5.81)
RDW: 15.4 % (ref 11.5–15.5)
WBC: 14.6 10*3/uL — ABNORMAL HIGH (ref 4.0–10.5)
nRBC: 0.4 % — ABNORMAL HIGH (ref 0.0–0.2)

## 2018-09-30 MED ORDER — MONTELUKAST SODIUM 10 MG PO TABS: 10.0000 mg | ORAL_TABLET | Freq: Every evening | ORAL | Status: DC | PRN

## 2018-09-30 MED ORDER — FUROSEMIDE 20 MG PO TABS
20.0000 mg | ORAL_TABLET | Freq: Two times a day (BID) | ORAL | Status: DC
  Administered 2018-09-30 – 2018-10-02 (×4): 20 mg via ORAL
  Filled 2018-09-30 (×4): qty 1

## 2018-09-30 NOTE — Significant Event (Signed)
Per IV Team RN, patient refused to be stuck for an IV. Patient states that their arm hurts and states "I am going home tomorrow" as the reasons for IV refusal. This RN did educate patient before IV Team arrived on why the IV is necessary and he did understand. Will continue to monitor.

## 2018-09-30 NOTE — Progress Notes (Signed)
PROGRESS NOTE                                                                                                                                                                                                             Patient Demographics:    Erik Morales, is a 78 y.o. male, DOB - 10-12-1940, MGQ:[id number]  Admit date - 09/25/2018   Admitting Physician Deatra James, MD  Outpatient Primary MD for the patient is Patient, No Pcp Per  LOS - 5  CC - GIB     Brief Narrative  Erik Morales is a 78 y.o. male with medical history significant of diabetes, not on insulin, hypertension on Lasix and ACE inhibitor was initially admitted to [facility] for sepsis with toxic encephalopathy due to pneumonia/UTI.  He then developed A. fib RVR was seen by cardiology, he also had a bump in his troponin was troponin levels around 5.  Cardiology placed him on Eliquis along with some cardiac medications unfortunately at [facility] he developed bright red blood per rectum.  He was then transferred to [facility] on 09/25/2018 for further care as GI coverage was not available at [facility].   Subjective:   Patient in bed, appears comfortable, denies any headache, no fever, no chest pain or pressure, no shortness of breath , no abdominal pain. No focal weakness.   Assessment  & Plan :     1.  Severe upper GI bleed causing acute blood loss related anemia.  Was on Eliquis which was reversed, received 4 units of packed RBC this admission so far, underwent EGD showing a nonbleeding duodenal ulcer with a visible vessel.  IV PPI transition to oral, case discussed with GI physician Dr. Simona Huh on 09/28/2018.  Can resume Eliquis with caution on 10/14/2018 till that time refrain from all anticoagulants and antiplatelet agents.  CBC tomorrow, advance activity, advance diet.  If stable likely discharge tomorrow.  2.  Elevated troponin.  This probably is NSTEMI due to demand ischemia caused by mismatch from  anemia and A. fib RVR mentation.  Due to ongoing GI bleed very limited options.  Rate controlled with amiodarone, blood pressure too low for beta-blocker bleeding hence no antiplatelet agents or heparin.  Seen by cardiology, chest pain-free with stable echocardiogram showing preserved EF and wall motion.  3.  Parox. A. fib with RVR.  Mali vas 2 score of at least 3.  Now been transitioned to amiodarone 200 twice daily, on 10/08/2018 PCP to switch  her to once a day, hold all anticoagulation and antiplatelets due to GI ulcer and bleeding per GI, discussed with Dr. Simona Huh gastroenterologist on 09/28/2018, Eliquis can be started with caution on 10/14/2018.  4.  ARF.  Transfuse, avoid nephrotoxins, stable UA and renal ultrasound  5.  Recently diagnosed sepsis due to pneumonia.  Stable with IV antibiotics, chest x-ray confirms right lower lobe pneumonia, no clinical history suggestive of aspiration, switched to oral antibiotics, stop date for Augmentin 10/01/2018.  Encouraged to sit up, minimize oxygen use, will continue to monitor ambulatory pulse ox.  6.  Weakness deconditioning.  PT, SNF/CIR.    7.  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  Compensated, resume low-dose home Lasix on 09/30/2018.  Monitor electrolytes.    8. DM 2 - ISS  CBG (last 3)  Recent Labs    09/29/18 2352 09/30/18 0450 09/30/18 0744  GLUCAP 128* 105* 109*      Family Communication  : Daughter and son-in-law at bedside  Code Status :  Full  Disposition Plan  :  CIR/SNF  Consults  :  GI, Cards  Procedures  :    EGD  Impression:      - Widely patent Schatzki ring.                           - Large hiatal hernia.                           - Normal stomach.                           - One non-bleeding duodenal ulcer with a visible                            vessel. Injected. Treated with bipolar cautery.                           - No specimens collected.  Recommendation:                               - Return patient to hospital ward for ongoing care.                           - Full liquid diet.                           - Continue present medications, including                            pantoprazole drip.                           - H 8 hour Hgb and Hct x 3 , then further plan to                            follow.                           H pylori serology  No aspirin, antiplatelet agents or anticoagulants.  TTE -   1. The left ventricle has normal systolic function of 89-16%. The cavity size was normal. There is mildly increased left ventricular wall thickness. Echo evidence of impaired diastolic relaxation.  2. The right ventricle has normal systolic function. The cavity was normal. There is no increase in right ventricular wall thickness.  3. Left atrial size was mildly dilated.  4. The mitral valve is normal in structure.  5. The tricuspid valve is normal in structure.  6. The aortic valve is normal in structure. There is mild thickening and mild calcification of the aortic valve.  7. The pulmonic valve was normal in structure.  8. The aortic root and ascending aorta are normal in size and structure.  DVT Prophylaxis  :  SCDs    Lab Results  Component Value Date   PLT 211 09/30/2018    Diet :  Diet Order            Diet Carb Modified Fluid consistency: Thin; Room service appropriate? Yes  Diet effective now               Inpatient Medications Scheduled Meds: . amiodarone  200 mg Oral BID  . amoxicillin-clavulanate  1 tablet Oral Q12H  . furosemide  20 mg Oral BID  . insulin aspart  0-9 Units Subcutaneous Q4H  . pantoprazole  40 mg Oral BID AC   Continuous Infusions:  PRN Meds:.acetaminophen **OR** [DISCONTINUED] acetaminophen, guaiFENesin-dextromethorphan, montelukast  Antibiotics  :   Anti-infectives (From admission, onward)   Start     Dose/Rate Route Frequency Ordered Stop   09/28/18 1000  amoxicillin-clavulanate  (AUGMENTIN) 875-125 MG per tablet 1 tablet     1 tablet Oral Every 12 hours 09/28/18 0940     09/25/18 2230  cefTRIAXone (ROCEPHIN) 2 g in sodium chloride 0.9 % 100 mL IVPB  Status:  Discontinued     2 g 200 mL/hr over 30 Minutes Intravenous Every 24 hours 09/25/18 2210 09/28/18 0940   09/25/18 2230  azithromycin (ZITHROMAX) 500 mg in sodium chloride 0.9 % 250 mL IVPB  Status:  Discontinued     500 mg 250 mL/hr over 60 Minutes Intravenous Every 24 hours 09/25/18 2210 09/28/18 0940        Objective:   Vitals:   09/29/18 0456 09/29/18 1423 09/29/18 2112 09/30/18 0453  BP: (!) 115/57 139/68 118/62 (!) 106/50  Pulse: 71 88 76 60  Resp: 18 18 19 18   Temp: 97.9 F (36.6 C) 97.6 F (36.4 C) 98.6 F (37 C) 97.8 F (36.6 C)  TempSrc: Oral Oral    SpO2: 98% 100% 97% 95%  Weight:      Height:        Wt Readings from Last 3 Encounters:  09/26/18 112 kg     Intake/Output Summary (Last 24 hours) at 09/30/2018 1009 Last data filed at 09/30/2018 0700 Gross per 24 hour  Intake -  Output 1750 ml  Net -1750 ml     Physical Exam  Awake Alert, Oriented X 3, No new F.N deficits, Normal affect [place].AT,PERRAL Supple Neck,No JVD, No cervical lymphadenopathy appriciated.  Symmetrical Chest wall movement, Good air movement bilaterally, CTAB RRR,No Gallops, Rubs or new Murmurs, No Parasternal Heave +ve B.Sounds, Abd Soft, No tenderness, No organomegaly appriciated, No rebound - guarding or rigidity. No Cyanosis, Clubbing or edema, No new Rash or bruise    Data Review:    CBC Recent Labs  Lab 09/26/18 0522  09/27/18 0234  09/28/18 0355 09/29/18 0354 09/29/18 1101 09/29/18 1903 09/30/18 0335  WBC 29.5*  --  24.5*  --  20.9* 17.9*  --   --  14.6*  HGB 7.1*   < > 7.6*   < > 8.9* 8.6* 9.5* 8.9* 8.2*  HCT 21.4*   < > 22.4*   < > 27.9* 27.8* 29.2* 27.4* 25.3*  PLT 211  --  176  --  170 196  --   --  211  MCV 88.4  --  88.5  --  91.2 92.7  --   --  94.1  MCH 29.3  --  30.0  --  29.1  28.7  --   --  30.5  MCHC 33.2  --  33.9  --  31.9 30.9  --   --  32.4  RDW 15.0  --  14.7  --  15.0 15.3  --   --  15.4   < > = values in this interval not displayed.    Chemistries  Recent Labs  Lab 09/25/18 2236 09/26/18 0522 09/27/18 0234 09/28/18 0355  NA 142 142 146* 145  K 4.2 4.7 4.3 3.8  CL 111 112* 115* 114*  CO2 24 22 24 23   GLUCOSE 247* 244* 199* 136*  BUN 56* 63* 56* 40*  CREATININE 1.86* 2.04* 1.67* 1.52*  CALCIUM 7.5* 7.6* 7.7* 8.0*  MG  --  1.8 1.9 2.0  AST 56*  --  26 25  ALT 25  --  21 20  ALKPHOS 35*  --  30* 33*  BILITOT 0.4  --  0.5 0.5   ------------------------------------------------------------------------------------------------------------------ No results for input(s): CHOL, HDL, LDLCALC, TRIG, CHOLHDL, LDLDIRECT in the last 72 hours.  No results found for: HGBA1C ------------------------------------------------------------------------------------------------------------------ No results for input(s): TSH, T4TOTAL, T3FREE, THYROIDAB in the last 72 hours.  Invalid input(s): FREET3 ------------------------------------------------------------------------------------------------------------------ No results for input(s): VITAMINB12, FOLATE, FERRITIN, TIBC, IRON, RETICCTPCT in the last 72 hours.  Coagulation profile Recent Labs  Lab 09/25/18 2236 09/27/18 0234  INR 1.42 1.12    No results for input(s): DDIMER in the last 72 hours.  Cardiac Enzymes Recent Labs  Lab 09/25/18 2235 09/26/18 0522  TROPONINI 15.74* 10.23*   ------------------------------------------------------------------------------------------------------------------ No results found for: BNP  Micro Results No results found for this or any previous visit (from the past 240 hour(s)).  Radiology Reports Dg Chest 2 View  Result Date: 09/27/2018 CLINICAL DATA:  Toxic encephalopathy, pneumonia, hypertension, type II diabetes mellitus EXAM: CHEST - 2 VIEW COMPARISON:   09/24/2018 FINDINGS: Upper normal heart size. Atherosclerotic calcification aorta. Mediastinal contours and pulmonary vascularity normal. Questionable RIGHT basilar infiltrate. Remaining lungs clear. Small pleural effusions blunt the posterior costophrenic angles. No pneumothorax. Endplate spur formation thoracic spine. IMPRESSION: Small BILATERAL pleural effusions and questionable RIGHT lower lobe infiltrate. Electronically Signed   By: Lavonia Dana M.D.   On: 09/27/2018 18:46   US Renal  Result Date: 09/26/2018 CLINICAL DATA:  Acute renal failure. EXAM: RENAL / URINARY TRACT ULTRASOUND COMPLETE COMPARISON:  Renal ultrasound, 09/22/2018 FINDINGS: Right Kidney: Renal measurements: 9.9 x 5.3 x 6.0 cm = volume: 164 mL. Mildly increased parenchymal echogenicity. Diffuse renal cortical thinning. No masses, stones or hydronephrosis. Left Kidney: Renal measurements: 10.2 x 5.9 x 6.2 cm = volume: 195 mL. Borderline increased parenchymal echogenicity. Diffuse renal cortical thinning. No masses, stones or hydronephrosis. Bladder: Decompressed with a Foley catheter. IMPRESSION: 1. No acute findings.  No hydronephrosis. 2. Bilateral renal cortical thinning. Increased right and borderline  increased left renal parenchymal echogenicity. Findings consistent with medical renal disease. Electronically Signed   By: Lajean Manes M.D.   On: 09/26/2018 16:28    Time Spent in minutes  30   Lala Lund M.D on 09/30/2018 at 10:09 AM  To page go to [email or web address] - password [facility]

## 2018-09-30 NOTE — Progress Notes (Signed)
Patient refusing IV nurse to start IV at this time. He says his arms are too sore. Oswaldo Milian, RN made aware.

## 2018-09-30 NOTE — Progress Notes (Signed)
Physical Therapy Treatment Patient Details Name: Erik Morales MRN: [id number] DOB: 22-Jan-1941 Today's Date: 09/30/2018    History of Present Illness Pt is a 78 y.o. male with a hx of DM, HTN, and HLD. He was admitted to [facility] for several days after presenting with AMS and hypothermia, found to have sepsis with UTI/PNA, acute kidney injury with Cr 3.3, increased troponin, anemia, and new onset PAF. He was apparently found in a ditch after having been outside for an unknown period of time. The patient states he had ridden his lawnmower into the ditch and had to try and flag someone down to help him. He was reported to be confused with altered mental status upon arrival to [facility] ER. He was transferred to [facility] after developing GIB with worsening anemia on anticoagulation.      PT Comments    Pt performed gait training and functional mobility during session.  Pt required increased assistance to achieve standing.  Pt required cues for gait training and RW safety.  Progression of gait distance achieved but as patient fatigues he presents with B shuffle and poor foot clearance.  Pt eager to return home but continues to benefit from skilled rehab at SNF to improve strength and function before returning home.  Plan next session for continued strengthening.  Will update recommendations to SNF placement at this time due to CIR denial.    Follow Up Recommendations  Supervision/Assistance - 24 hour;SNF(If patient refuses placement will require HHPT.  )     Equipment Recommendations  Rolling walker with 5" wheels    Recommendations for Other Services       Precautions / Restrictions Precautions Precautions: Fall Restrictions Weight Bearing Restrictions: No    Mobility  Bed Mobility Overal bed mobility: Needs Assistance Bed Mobility: Supine to Sit     Supine to sit: Mod assist     General bed mobility comments: Pt continues to use railing to pull hips to edge of bed, able  to remove B LEs from bed but continued to require moderate assistance for trunk elevation.    Transfers Overall transfer level: Needs assistance Equipment used: Rolling walker (2 wheeled) Transfers: Sit to/from Stand Sit to Stand: Mod assist;+2 physical assistance         General transfer comment: Pt unable to achieve standing from standard seat height and required moderate assistance +2 to achieve standing with poor quad strength noted.    Ambulation/Gait Ambulation/Gait assistance: Mod assist;+2 safety/equipment Gait Distance (Feet): 100 Feet Assistive device: Rolling walker (2 wheeled) Gait Pattern/deviations: Wide base of support;Decreased stride length;Decreased dorsiflexion - right;Decreased dorsiflexion - left;Step-to pattern;Shuffle Gait velocity: decreased   General Gait Details: Pt required cues for step length and foot clearance.  Pt begins to shuffle due to fatigue.  Close chair follow for safety.  No buckling noted during session.     Stairs             Wheelchair Mobility    Modified Rankin (Stroke Patients Only)       Balance Overall balance assessment: Needs assistance Sitting-balance support: No upper extremity supported;Feet supported Sitting balance-Leahy Scale: Fair     Standing balance support: Bilateral upper extremity supported;During functional activity Standing balance-Leahy Scale: Poor Standing balance comment: reliant on RW                             Cognition Arousal/Alertness: Awake/alert Behavior During Therapy: WFL for tasks assessed/performed Overall  Cognitive Status: Impaired/Different from baseline Area of Impairment: Safety/judgement                         Safety/Judgement: Decreased awareness of safety;Decreased awareness of deficits     General Comments: Pt continues to want to return home despite innability to stand without +2 assistance.        Exercises Total Joint Exercises Ankle  Circles/Pumps: AROM;Both;10 reps;Supine Quad Sets: AROM;Both;10 reps;Supine Hip ABduction/ADduction: AAROM;Both;10 reps;Supine General Exercises - Lower Extremity Long Arc Quad: AROM;Both;10 reps;Seated Hip Flexion/Marching: AROM;Both;10 reps;Seated    General Comments        Pertinent Vitals/Pain Pain Assessment: Faces Faces Pain Scale: Hurts even more Pain Location: R ankle/foot Pain Descriptors / Indicators: Guarding;Grimacing Pain Intervention(s): Monitored during session;Repositioned    Home Living                      Prior Function            PT Goals (current goals can now be found in the care plan section) Acute Rehab PT Goals Patient Stated Goal: "To go home." Potential to Achieve Goals: Good Progress towards PT goals: Progressing toward goals    Frequency    Min 3X/week      PT Plan Current plan remains appropriate    Co-evaluation              AM-PAC PT "6 Clicks" Mobility   Outcome Measure  Help needed turning from your back to your side while in a flat bed without using bedrails?: A Lot Help needed moving from lying on your back to sitting on the side of a flat bed without using bedrails?: A Lot Help needed moving to and from a bed to a chair (including a wheelchair)?: A Lot Help needed standing up from a chair using your arms (e.g., wheelchair or bedside chair)?: A Lot Help needed to walk in hospital room?: A Lot Help needed climbing 3-5 steps with a railing? : A Lot 6 Click Score: 12    End of Session Equipment Utilized During Treatment: Gait belt Activity Tolerance: Patient tolerated treatment well Patient left: with call bell/phone within reach;in chair;with chair alarm set Nurse Communication: Mobility status PT Visit Diagnosis: Muscle weakness (generalized) (M62.81);Difficulty in walking, not elsewhere classified (R26.2);Pain Pain - Right/Left: Right Pain - part of body: Ankle and joints of foot     Time: 8185-6314 PT  Time Calculation (min) (ACUTE ONLY): 20 min  Charges:  $Gait Training: 8-22 mins                     Governor Rooks, PTA Acute Rehabilitation Services Pager [phone] Office [phone]     Luis Sami Eli Hose 09/30/2018, 4:32 PM

## 2018-10-01 DIAGNOSIS — N179 Acute kidney failure, unspecified: Secondary | ICD-10-CM

## 2018-10-01 LAB — CBC
HEMATOCRIT: 26.9 % — AB (ref 39.0–52.0)
Hemoglobin: 8.4 g/dL — ABNORMAL LOW (ref 13.0–17.0)
MCH: 29.3 pg (ref 26.0–34.0)
MCHC: 31.2 g/dL (ref 30.0–36.0)
MCV: 93.7 fL (ref 80.0–100.0)
Platelets: 244 10*3/uL (ref 150–400)
RBC: 2.87 MIL/uL — AB (ref 4.22–5.81)
RDW: 15.2 % (ref 11.5–15.5)
WBC: 14.5 10*3/uL — ABNORMAL HIGH (ref 4.0–10.5)
nRBC: 0.3 % — ABNORMAL HIGH (ref 0.0–0.2)

## 2018-10-01 LAB — GLUCOSE, CAPILLARY
Glucose-Capillary: 127 mg/dL — ABNORMAL HIGH (ref 70–99)
Glucose-Capillary: 132 mg/dL — ABNORMAL HIGH (ref 70–99)
Glucose-Capillary: 146 mg/dL — ABNORMAL HIGH (ref 70–99)
Glucose-Capillary: 93 mg/dL (ref 70–99)
Glucose-Capillary: 94 mg/dL (ref 70–99)
Glucose-Capillary: 96 mg/dL (ref 70–99)

## 2018-10-01 NOTE — Significant Event (Addendum)
Per phlebotomy, patient refused morning labs. Patient was educated by phlebotomy, but still refused.   Explained to patient the importance of acquiring labs and that it is related to the reason why he is in the hospital. Continues to refuse and states that "I am a free white man. You can't make me do what I don't want." He continued on and said "I am leaving in the morning." and when this RN explained that the MD is the one that decides if he can go home safely or not and patient states "I do not care." I reminded patient that if he were to leave against medical advice, he would have to sign a paper and have someone he knows take him home and he said "I will call my wife in the morning and have her take me home." Will continue to monitor.

## 2018-10-01 NOTE — Evaluation (Signed)
Occupational Therapy Evaluation Patient Details Name: Erik Morales MRN: [id number] DOB: Jan 03, 1941 Today's Date: 10/01/2018    History of Present Illness Pt is a 78 y.o. male with a hx of DM, HTN, and HLD. He was admitted to [facility] for several days after presenting with AMS and hypothermia, found to have sepsis with UTI/PNA, acute kidney injury with Cr 3.3, increased troponin, anemia, and new onset PAF. He was apparently found in a ditch after having been outside for an unknown period of time. The patient states he had ridden his lawnmower into the ditch and had to try and flag someone down to help him. He was reported to be confused with altered mental status upon arrival to [facility] ER. He was transferred to [facility] after developing GIB with worsening anemia on anticoagulation.     Clinical Impression   Pt admitted to hospital with above problem list. Pt now presenting to OT with deficits in ADL transfers, generalized weakness, dynamic/static standing balance, and ADLs in general. Pt currently requiring max A overall for ADLs and transfers. Pt would be a great candidate for CIR to maximize return to PLOF as pt was very active, still working, and driving. OT will follow acutely.     Follow Up Recommendations  CIR    Equipment Recommendations  None recommended by OT    Recommendations for Other Services PT consult;Rehab consult     Precautions / Restrictions Precautions Precautions: Fall Restrictions Weight Bearing Restrictions: No      Mobility Bed Mobility               General bed mobility comments: Pt sitting in recliner upon entry  Transfers Overall transfer level: Needs assistance Equipment used: Rolling walker (2 wheeled) Transfers: Sit to/from Stand Sit to Stand: Max assist         General transfer comment: mod A to stand from recliner, max A to stand from low toilet with use of grab bars    Balance Overall balance assessment: Needs  assistance Sitting-balance support: No upper extremity supported;Feet supported Sitting balance-Leahy Scale: Fair     Standing balance support: Bilateral upper extremity supported;During functional activity Standing balance-Leahy Scale: Poor Standing balance comment: reliant on RW                            ADL either performed or assessed with clinical judgement   ADL Overall ADL's : Needs assistance/impaired Eating/Feeding: Supervision/ safety;Sitting   Grooming: Wash/dry hands;Wash/dry face;Oral care;Sitting;Supervision/safety   Upper Body Bathing: Min guard;Sitting   Lower Body Bathing: Maximal assistance;Sit to/from stand   Upper Body Dressing : Min guard;Sitting   Lower Body Dressing: Maximal assistance;Sit to/from stand   Toilet Transfer: Maximal assistance;RW;Ambulation   Toileting- Water quality scientist and Hygiene: Sit to/from stand;Moderate assistance Toileting - Clothing Manipulation Details (indicate cue type and reason): Pt able to complete 75% of peri cleansing from seated level, physical assist needed during standing     Functional mobility during ADLs: Maximal assistance;Rolling walker General ADL Comments: Max A during functional mobility to bathroom. Unable to don socks sitting, even with starting over toes.      Vision Baseline Vision/History: No visual deficits Patient Visual Report: No change from baseline Vision Assessment?: No apparent visual deficits            Pertinent Vitals/Pain Pain Assessment: No/denies pain     Hand Dominance Right   Extremity/Trunk Assessment Upper Extremity Assessment Upper Extremity Assessment: Generalized  weakness   Lower Extremity Assessment Lower Extremity Assessment: Generalized weakness   Cervical / Trunk Assessment Cervical / Trunk Assessment: Kyphotic   Communication Communication Communication: No difficulties   Cognition Arousal/Alertness: Awake/alert Behavior During Therapy: WFL for  tasks assessed/performed Overall Cognitive Status: Impaired/Different from baseline Area of Impairment: Safety/judgement                         Safety/Judgement: Decreased awareness of safety;Decreased awareness of deficits     General Comments: Unaware of deficits   General Comments  BP 115/66. Pt's daughter present throughout session, very supportive             Home Living Family/patient expects to be discharged to:: Private residence Living Arrangements: Spouse/significant other Available Help at Discharge: Family;Available 24 hours/day Type of Home: Mobile home Home Access: Stairs to enter Entrance Stairs-Number of Steps: 5 Entrance Stairs-Rails: Right;Left Home Layout: One level     Bathroom Shower/Tub: Tub/shower unit;Walk-in shower   Bathroom Toilet: Standard     Home Equipment: None          Prior Functioning/Environment Level of Independence: Independent        Comments: Still driving and working, helping care for wife with mild cognitive deficits        OT Problem List: Impaired balance (sitting and/or standing);Decreased activity tolerance;Decreased cognition;Decreased coordination;Decreased safety awareness;Decreased knowledge of use of DME or AE;Decreased knowledge of precautions;Cardiopulmonary status limiting activity;Increased edema;Pain;Decreased range of motion;Decreased strength      OT Treatment/Interventions: Self-care/ADL training;Therapeutic exercise;Energy conservation;DME and/or AE instruction;Balance training;Patient/family education;Cognitive remediation/compensation;Therapeutic activities;Modalities;Manual therapy    OT Goals(Current goals can be found in the care plan section) Acute Rehab OT Goals Patient Stated Goal: "To go home." OT Goal Formulation: With patient/family Time For Goal Achievement: 10/09/18 Potential to Achieve Goals: Good  OT Frequency: Min 3X/week    AM-PAC OT "6 Clicks" Daily Activity     Outcome  Measure Help from another person eating meals?: None Help from another person taking care of personal grooming?: A Little Help from another person toileting, which includes using toliet, bedpan, or urinal?: A Lot Help from another person bathing (including washing, rinsing, drying)?: A Lot Help from another person to put on and taking off regular upper body clothing?: A Little Help from another person to put on and taking off regular lower body clothing?: A Lot 6 Click Score: 16   End of Session Equipment Utilized During Treatment: Rolling walker Nurse Communication: Mobility status;Other (comment)(blood in stool)  Activity Tolerance: Patient tolerated treatment well Patient left: in chair;with call bell/phone within reach;with chair alarm set;with family/visitor present  OT Visit Diagnosis: Unsteadiness on feet (R26.81);Muscle weakness (generalized) (M62.81)                Time: 7793-9030 OT Time Calculation (min): 30 min Charges:  OT General Charges $OT Visit: 1 Visit OT Evaluation $OT Eval Moderate Complexity: 1 Mod OT Treatments $Self Care/Home Management : 8-22 mins  Curtis Sites OTR/L  10/01/2018, 2:18 PM

## 2018-10-01 NOTE — Care Management Important Message (Signed)
Important Message  Patient Details  Name: Erik Morales MRN: [id number] Date of Birth: [birth year]   Medicare Important Message Given:  Yes    [name] 10/01/2018, 9:54 AM

## 2018-10-01 NOTE — Progress Notes (Signed)
IP rehab admissions - I have opened the case requesting acute inpatient rehab admission.  However, we did not have an OT order until yesterday.  OT is scheduled to see patient today.  I will follow up once I hear back from insurance case manager.  Call me for questions.  [phone]

## 2018-10-01 NOTE — Progress Notes (Signed)
Patient's granddaughters have selected [employer]. CSW will request they start authorization just in case CIR receives a denial from insurance.   Erik Morales Erik Zemanek LCSW [phone]

## 2018-10-01 NOTE — Progress Notes (Signed)
Patient ID: Erik Morales, male   DOB: Jul 01, 1941, 78 y.o.   MRN: [id number]  PROGRESS NOTE    Erik Morales  Erik Morales DOB: [birth year] Erik Morales PCP: Patient, No Pcp Per   Brief Narrative:  78 year old male with history of diabetes mellitus, hypertension was initially admitted to [facility] for sepsis with toxic encephalopathy due to pneumonia/UTI.  He then developed A. fib with RVR along with elevated troponin up to around 5 and was seen by cardiology.  He was started on Eliquis but subsequently developed bright red blood per rectum.  He was transferred to [facility] on [address] for further care as GI coverage was not available at [facility].  GI was consulted.  Assessment & Plan:   Principal Problem:   Hematochezia Active Problems:   AKI (acute kidney injury) ([place])   Severe sepsis (HCC)   Atrial fibrillation with RVR (HCC)   Elevated troponin   Abnormal thyroid function test   DM2 (diabetes mellitus, type 2) (HCC)   HTN (hypertension)   E-coli UTI   CAP (community acquired pneumonia)   Pressure injury of skin   Duodenal ulcer with hemorrhage   Melena   Acute blood loss anemia   Tobacco abuse   Leukocytosis   Upper GI bleeding causing acute blood loss anemia -Status post 4 units packed red cells during this admission.  Hemoglobin 8.4 today. -Status post EGD which showed nonbleeding duodenal ulcer with a visible vessel.  Continue oral Protonix twice a day.  Case was discussed with GI by prior hospitalist and GI recommends to resume Eliquis with caution on 10/14/2018 and refrain from all anticoagulants or antiplatelet agents till then. -No further episodes of bleeding.  Elevated troponin -From demand ischemia.  Not on any antiplatelets because of above.  Not on beta-blocker because of low blood pressure.  Seen by cardiology who recommended outpatient follow-up.  Currently chest pain-free.  Stable echocardiogram showing preserved EF and wall  motion.  Paroxysmal atrial fibrillation  -Currently rate controlled.  Continue amiodarone.  Eliquis can be resumed on 10/14/2018 as per GI.  Outpatient follow-up with cardiology  Acute renal failure -Improving.  Renal ultrasound did not show hydronephrosis.  Monitor creatinine  Recently diagnosed sepsis due to right lower lobar pneumonia -Sepsis has resolved.  Currently on Augmentin which will be stopped after today's doses. -Currently on room air   Generalized deconditioning -PT recommended CIR.  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  Strict input and output.  Daily weights.  Continue Lasix.  Diabetes mellitus type 2 -Continue Accu-Cheks.  Blood sugar stable    DVT prophylaxis: SCDs Code Status: Full Family Communication: None at bedside Disposition Plan: CIR versus SNF  Consultants: GI/cardiology  Procedures:  EGD  Impression: - Widely patent Schatzki ring. - Large hiatal hernia. - Normal stomach. - One non-bleeding duodenal ulcer with a visible  vessel. Injected. Treated with bipolar cautery. - No specimens collected.  Recommendation:                             - Return patient to hospital ward for ongoing care. - Full liquid diet. - Continue present medications, including  pantoprazole drip. - H 8 hour Hgb and Hct x 3 , then further plan to  follow. H pylori serology No aspirin, antiplatelet agents or anticoagulants.  TTE -  1. The left ventricle has normal systolic function of 01-74%. The cavity size  was normal. There is mildly increased left ventricular wall thickness. Echo evidence of impaired diastolic  relaxation. 2. The right ventricle has normal systolic function. The cavity was normal. There is no increase in right ventricular wall thickness. 3. Left atrial size was mildly dilated. 4. The mitral valve is normal in structure. 5. The tricuspid valve is normal in structure. 6. The aortic valve is normal in structure. There is mild thickening and mild calcification of the aortic valve. 7. The pulmonic valve was normal in structure. 8. The aortic root and ascending aorta are normal in size and structure.  Antimicrobials:  Anti-infectives (From admission, onward)   Start     Dose/Rate Route Frequency Ordered Stop   09/28/18 1000  amoxicillin-clavulanate (AUGMENTIN) 875-125 MG per tablet 1 tablet     1 tablet Oral Every 12 hours 09/28/18 0940     09/25/18 2230  cefTRIAXone (ROCEPHIN) 2 g in sodium chloride 0.9 % 100 mL IVPB  Status:  Discontinued     2 g 200 mL/hr over 30 Minutes Intravenous Every 24 hours 09/25/18 2210 09/28/18 0940   09/25/18 2230  azithromycin (ZITHROMAX) 500 mg in sodium chloride 0.9 % 250 mL IVPB  Status:  Discontinued     500 mg 250 mL/hr over 60 Minutes Intravenous Every 24 hours 09/25/18 2210 09/28/18 0940         Subjective: Patient seen and examined at bedside.  He is awake, poor historian.  Denies any overnight fever, nausea or vomiting.  He had refused blood work early this morning as per Engineer, civil (consulting).  Objective: Vitals:   09/30/18 2031 09/30/18 2052 10/01/18 0448 10/01/18 0531  BP: (!) 82/54 100/66 (!) 87/60 96/69  Pulse: 73 70 64 67  Resp: 16  16   Temp: 99 F (37.2 C)  97.7 F (36.5 C)   TempSrc: Oral  Oral   SpO2: 97%  98%   Weight:      Height:        Intake/Output Summary (Last 24 hours) at 10/01/2018 1348 Last data filed at 10/01/2018 1026 Gross per 24 hour  Intake 240 ml  Output 3100 ml  Net -2860 ml   Filed Weights   09/26/18 0900  Weight: 112 kg    Examination:  General exam: Appears calm and comfortable.  No  distress.  Poor historian Respiratory system: Bilateral decreased breath sounds at bases, scattered crackles Cardiovascular system: S1 & S2 heard, Rate controlled Gastrointestinal system: Abdomen is nondistended, soft and nontender. Normal bowel sounds heard. Extremities: No cyanosis, clubbing; trace edema  Data Reviewed: I have personally reviewed following labs and imaging studies  CBC: Recent Labs  Lab 09/27/18 0234  09/28/18 0355 09/29/18 0354 09/29/18 1101 09/29/18 1903 09/30/18 0335 10/01/18 1129  WBC 24.5*  --  20.9* 17.9*  --   --  14.6* 14.5*  HGB 7.6*   < > 8.9* 8.6* 9.5* 8.9* 8.2* 8.4*  HCT 22.4*   < > 27.9* 27.8* 29.2* 27.4* 25.3* 26.9*  MCV 88.5  --  91.2 92.7  --   --  94.1 93.7  PLT 176  --  170 196  --   --  211 244   < > = values in this interval not displayed.   Basic Metabolic Panel: Recent Labs  Lab 09/25/18 2236 09/26/18 0522 09/27/18 0234 09/28/18 0355  NA 142 142 146* 145  K 4.2 4.7 4.3 3.8  CL 111 112* 115* 114*  CO2 24 22 24 23   GLUCOSE 247* 244*  199* 136*  BUN 56* 63* 56* 40*  CREATININE 1.86* 2.04* 1.67* 1.52*  CALCIUM 7.5* 7.6* 7.7* 8.0*  MG  --  1.8 1.9 2.0   GFR: Estimated Creatinine Clearance: 49.4 mL/min (A) (by C-G formula based on SCr of 1.52 mg/dL (H)). Liver Function Tests: Recent Labs  Lab 09/25/18 2236 09/27/18 0234 09/28/18 0355  AST 56* 26 25  ALT 25 21 20   ALKPHOS 35* 30* 33*  BILITOT 0.4 0.5 0.5  PROT 4.5* 4.1* 4.3*  ALBUMIN 1.9* 1.8* 2.0*   No results for input(s): LIPASE, AMYLASE in the last 168 hours. No results for input(s): AMMONIA in the last 168 hours. Coagulation Profile: Recent Labs  Lab 09/25/18 2236 09/27/18 0234  INR 1.42 1.12   Cardiac Enzymes: Recent Labs  Lab 09/25/18 2235 09/26/18 0522  TROPONINI 15.74* 10.23*   BNP (last 3 results) No results for input(s): PROBNP in the last 8760 hours. HbA1C: No results for input(s): HGBA1C in the last 72 hours. CBG: Recent Labs  Lab  09/30/18 2006 10/01/18 0019 10/01/18 0445 10/01/18 0802 10/01/18 1150  GLUCAP 160* 93 96 94 132*   Lipid Profile: No results for input(s): CHOL, HDL, LDLCALC, TRIG, CHOLHDL, LDLDIRECT in the last 72 hours. Thyroid Function Tests: No results for input(s): TSH, T4TOTAL, FREET4, T3FREE, THYROIDAB in the last 72 hours. Anemia Panel: No results for input(s): VITAMINB12, FOLATE, FERRITIN, TIBC, IRON, RETICCTPCT in the last 72 hours. Sepsis Labs: No results for input(s): PROCALCITON, LATICACIDVEN in the last 168 hours.  No results found for this or any previous visit (from the past 240 hour(s)).       Radiology Studies: No results found.      Scheduled Meds: . amiodarone  200 mg Oral BID  . amoxicillin-clavulanate  1 tablet Oral Q12H  . furosemide  20 mg Oral BID  . insulin aspart  0-9 Units Subcutaneous Q4H  . pantoprazole  40 mg Oral BID AC   Continuous Infusions:   LOS: 6 days        Aline August, MD [facility] Pager [phone]  If 7PM-7AM, please contact night-coverage [email or web address] Password [email or web address] 10/01/2018, 1:48 PM

## 2018-10-02 ENCOUNTER — Encounter (HOSPITAL_COMMUNITY): Payer: Self-pay | Admitting: *Deleted

## 2018-10-02 ENCOUNTER — Other Ambulatory Visit: Payer: Self-pay

## 2018-10-02 ENCOUNTER — Inpatient Hospital Stay (HOSPITAL_COMMUNITY)
Admission: RE | Admit: 2018-10-02 | Discharge: 2018-10-11 | DRG: 945 | Disposition: A | Discharge status: Home Health | Payer: Medicare Other | Source: Intra-hospital | Attending: Physical Medicine & Rehabilitation | Admitting: Physical Medicine & Rehabilitation

## 2018-10-02 DIAGNOSIS — I130 Hypertensive heart and chronic kidney disease with heart failure and stage 1 through stage 4 chronic kidney disease, or unspecified chronic kidney disease: Secondary | ICD-10-CM | POA: Diagnosis not present

## 2018-10-02 DIAGNOSIS — Z09 Encounter for follow-up examination after completed treatment for conditions other than malignant neoplasm: Secondary | ICD-10-CM

## 2018-10-02 DIAGNOSIS — K222 Esophageal obstruction: Secondary | ICD-10-CM | POA: Diagnosis not present

## 2018-10-02 DIAGNOSIS — D509 Iron deficiency anemia, unspecified: Secondary | ICD-10-CM | POA: Diagnosis not present

## 2018-10-02 DIAGNOSIS — J189 Pneumonia, unspecified organism: Secondary | ICD-10-CM | POA: Diagnosis not present

## 2018-10-02 DIAGNOSIS — I959 Hypotension, unspecified: Secondary | ICD-10-CM | POA: Diagnosis not present

## 2018-10-02 DIAGNOSIS — E1169 Type 2 diabetes mellitus with other specified complication: Secondary | ICD-10-CM | POA: Diagnosis not present

## 2018-10-02 DIAGNOSIS — I4891 Unspecified atrial fibrillation: Secondary | ICD-10-CM | POA: Diagnosis not present

## 2018-10-02 DIAGNOSIS — Z79899 Other long term (current) drug therapy: Secondary | ICD-10-CM | POA: Diagnosis not present

## 2018-10-02 DIAGNOSIS — E669 Obesity, unspecified: Secondary | ICD-10-CM

## 2018-10-02 DIAGNOSIS — R5381 Other malaise: Secondary | ICD-10-CM

## 2018-10-02 DIAGNOSIS — E8809 Other disorders of plasma-protein metabolism, not elsewhere classified: Secondary | ICD-10-CM | POA: Diagnosis present

## 2018-10-02 DIAGNOSIS — Z87891 Personal history of nicotine dependence: Secondary | ICD-10-CM | POA: Diagnosis not present

## 2018-10-02 DIAGNOSIS — E1122 Type 2 diabetes mellitus with diabetic chronic kidney disease: Secondary | ICD-10-CM | POA: Diagnosis not present

## 2018-10-02 DIAGNOSIS — R7309 Other abnormal glucose: Secondary | ICD-10-CM

## 2018-10-02 DIAGNOSIS — N179 Acute kidney failure, unspecified: Secondary | ICD-10-CM

## 2018-10-02 DIAGNOSIS — N184 Chronic kidney disease, stage 4 (severe): Secondary | ICD-10-CM

## 2018-10-02 DIAGNOSIS — Z8679 Personal history of other diseases of the circulatory system: Secondary | ICD-10-CM

## 2018-10-02 DIAGNOSIS — Z833 Family history of diabetes mellitus: Secondary | ICD-10-CM | POA: Diagnosis not present

## 2018-10-02 DIAGNOSIS — N183 Chronic kidney disease, stage 3 (moderate): Secondary | ICD-10-CM | POA: Diagnosis present

## 2018-10-02 DIAGNOSIS — E46 Unspecified protein-calorie malnutrition: Secondary | ICD-10-CM | POA: Diagnosis not present

## 2018-10-02 DIAGNOSIS — I5032 Chronic diastolic (congestive) heart failure: Secondary | ICD-10-CM | POA: Diagnosis present

## 2018-10-02 DIAGNOSIS — K449 Diaphragmatic hernia without obstruction or gangrene: Secondary | ICD-10-CM | POA: Diagnosis present

## 2018-10-02 DIAGNOSIS — I214 Non-ST elevation (NSTEMI) myocardial infarction: Secondary | ICD-10-CM | POA: Diagnosis present

## 2018-10-02 DIAGNOSIS — E785 Hyperlipidemia, unspecified: Secondary | ICD-10-CM | POA: Diagnosis not present

## 2018-10-02 DIAGNOSIS — N189 Chronic kidney disease, unspecified: Secondary | ICD-10-CM

## 2018-10-02 DIAGNOSIS — D72829 Elevated white blood cell count, unspecified: Secondary | ICD-10-CM | POA: Diagnosis not present

## 2018-10-02 DIAGNOSIS — K921 Melena: Secondary | ICD-10-CM | POA: Diagnosis not present

## 2018-10-02 DIAGNOSIS — Z8719 Personal history of other diseases of the digestive system: Secondary | ICD-10-CM | POA: Diagnosis not present

## 2018-10-02 DIAGNOSIS — J90 Pleural effusion, not elsewhere classified: Secondary | ICD-10-CM | POA: Diagnosis not present

## 2018-10-02 HISTORY — DX: Other malaise: R53.81

## 2018-10-02 LAB — CBC WITH DIFFERENTIAL/PLATELET
Abs Immature Granulocytes: 0.5 10*3/uL — ABNORMAL HIGH (ref 0.00–0.07)
BASOS ABS: 0 10*3/uL (ref 0.0–0.1)
Basophils Relative: 0 %
EOS PCT: 1 %
Eosinophils Absolute: 0.1 10*3/uL (ref 0.0–0.5)
HCT: 27 % — ABNORMAL LOW (ref 39.0–52.0)
Hemoglobin: 8.6 g/dL — ABNORMAL LOW (ref 13.0–17.0)
Lymphocytes Relative: 14 %
Lymphs Abs: 1.8 10*3/uL (ref 0.7–4.0)
MCH: 29.7 pg (ref 26.0–34.0)
MCHC: 31.9 g/dL (ref 30.0–36.0)
MCV: 93.1 fL (ref 80.0–100.0)
Metamyelocytes Relative: 3 %
Monocytes Absolute: 0.4 10*3/uL (ref 0.1–1.0)
Monocytes Relative: 3 %
Myelocytes: 1 %
NRBC: 0.4 % — AB (ref 0.0–0.2)
Neutro Abs: 9.9 10*3/uL — ABNORMAL HIGH (ref 1.7–7.7)
Neutrophils Relative %: 78 %
Platelets: 261 10*3/uL (ref 150–400)
RBC: 2.9 MIL/uL — ABNORMAL LOW (ref 4.22–5.81)
RDW: 15.1 % (ref 11.5–15.5)
WBC: 12.7 10*3/uL — ABNORMAL HIGH (ref 4.0–10.5)
nRBC: 0 /100 WBC

## 2018-10-02 LAB — BASIC METABOLIC PANEL
Anion gap: 8 (ref 5–15)
BUN: 15 mg/dL (ref 8–23)
CO2: 28 mmol/L (ref 22–32)
CREATININE: 1.64 mg/dL — AB (ref 0.61–1.24)
Calcium: 7.8 mg/dL — ABNORMAL LOW (ref 8.9–10.3)
Chloride: 104 mmol/L (ref 98–111)
GFR calc Af Amer: 45 mL/min — ABNORMAL LOW (ref >60)
GFR calc non Af Amer: 39 mL/min — ABNORMAL LOW (ref >60)
Glucose, Bld: 123 mg/dL — ABNORMAL HIGH (ref 70–99)
Potassium: 4.2 mmol/L (ref 3.5–5.1)
Sodium: 140 mmol/L (ref 135–145)

## 2018-10-02 LAB — GLUCOSE, CAPILLARY
Glucose-Capillary: 101 mg/dL — ABNORMAL HIGH (ref 70–99)
Glucose-Capillary: 109 mg/dL — ABNORMAL HIGH (ref 70–99)
Glucose-Capillary: 110 mg/dL — ABNORMAL HIGH (ref 70–99)
Glucose-Capillary: 129 mg/dL — ABNORMAL HIGH (ref 70–99)
Glucose-Capillary: 133 mg/dL — ABNORMAL HIGH (ref 70–99)

## 2018-10-02 LAB — MAGNESIUM: Magnesium: 1.9 mg/dL (ref 1.7–2.4)

## 2018-10-02 MED ORDER — ACETAMINOPHEN 325 MG PO TABS: 325.0000 mg | ORAL_TABLET | ORAL | Status: DC | PRN

## 2018-10-02 MED ORDER — INSULIN ASPART 100 UNIT/ML ~~LOC~~ SOLN
0.0000 [IU] | Freq: Three times a day (TID) | SUBCUTANEOUS | Status: DC
  Administered 2018-10-02 – 2018-10-04 (×4): 1 [IU] via SUBCUTANEOUS
  Administered 2018-10-04: 2 [IU] via SUBCUTANEOUS
  Administered 2018-10-05 – 2018-10-10 (×7): 1 [IU] via SUBCUTANEOUS

## 2018-10-02 MED ORDER — PANTOPRAZOLE SODIUM 40 MG PO TBEC
40.0000 mg | DELAYED_RELEASE_TABLET | Freq: Two times a day (BID) | ORAL | Status: DC
  Administered 2018-10-03 – 2018-10-11 (×17): 40 mg via ORAL
  Filled 2018-10-02 (×17): qty 1

## 2018-10-02 MED ORDER — PROCHLORPERAZINE 25 MG RE SUPP: 12.5000 mg | Freq: Four times a day (QID) | RECTAL | Status: DC | PRN

## 2018-10-02 MED ORDER — ALUM & MAG HYDROXIDE-SIMETH 200-200-20 MG/5ML PO SUSP: 30.0000 mL | ORAL | Status: DC | PRN

## 2018-10-02 MED ORDER — PROCHLORPERAZINE EDISYLATE 10 MG/2ML IJ SOLN: 5.0000 mg | Freq: Four times a day (QID) | INTRAMUSCULAR | Status: DC | PRN

## 2018-10-02 MED ORDER — PRO-STAT SUGAR FREE PO LIQD
30.0000 mL | Freq: Two times a day (BID) | ORAL | Status: DC
  Administered 2018-10-02 – 2018-10-11 (×18): 30 mL via ORAL
  Filled 2018-10-02 (×18): qty 30

## 2018-10-02 MED ORDER — INSULIN ASPART 100 UNIT/ML ~~LOC~~ SOLN: 0.0000 [IU] | Freq: Every day | SUBCUTANEOUS | Status: DC

## 2018-10-02 MED ORDER — MONTELUKAST SODIUM 10 MG PO TABS: 10.0000 mg | ORAL_TABLET | Freq: Every evening | ORAL | Status: DC | PRN

## 2018-10-02 MED ORDER — PROCHLORPERAZINE MALEATE 5 MG PO TABS: 5.0000 mg | ORAL_TABLET | Freq: Four times a day (QID) | ORAL | Status: DC | PRN

## 2018-10-02 MED ORDER — BISACODYL 10 MG RE SUPP: 10.0000 mg | Freq: Every day | RECTAL | Status: DC | PRN

## 2018-10-02 MED ORDER — GUAIFENESIN-DM 100-10 MG/5ML PO SYRP: 5.0000 mL | ORAL_SOLUTION | Freq: Four times a day (QID) | ORAL | Status: DC | PRN

## 2018-10-02 MED ORDER — FLEET ENEMA 7-19 GM/118ML RE ENEM: 1.0000 | ENEMA | Freq: Once | RECTAL | Status: DC | PRN

## 2018-10-02 MED ORDER — PANTOPRAZOLE SODIUM 40 MG PO TBEC: 40.0000 mg | DELAYED_RELEASE_TABLET | Freq: Two times a day (BID) | ORAL | Status: DC

## 2018-10-02 MED ORDER — FUROSEMIDE 20 MG PO TABS
20.0000 mg | ORAL_TABLET | Freq: Two times a day (BID) | ORAL | Status: DC
  Administered 2018-10-02 – 2018-10-06 (×7): 20 mg via ORAL
  Filled 2018-10-02 (×8): qty 1

## 2018-10-02 MED ORDER — TRAZODONE HCL 50 MG PO TABS: 25.0000 mg | ORAL_TABLET | Freq: Every evening | ORAL | Status: DC | PRN

## 2018-10-02 MED ORDER — AMOXICILLIN-POT CLAVULANATE 875-125 MG PO TABS
1.0000 | ORAL_TABLET | Freq: Two times a day (BID) | ORAL | Status: DC
  Administered 2018-10-02: 1 via ORAL
  Filled 2018-10-02 (×2): qty 1

## 2018-10-02 MED ORDER — AMIODARONE HCL 200 MG PO TABS: 200.0000 mg | ORAL_TABLET | Freq: Two times a day (BID) | ORAL | Status: DC

## 2018-10-02 MED ORDER — AMIODARONE HCL 200 MG PO TABS
200.0000 mg | ORAL_TABLET | Freq: Two times a day (BID) | ORAL | Status: DC
  Administered 2018-10-02 – 2018-10-11 (×18): 200 mg via ORAL
  Filled 2018-10-02 (×18): qty 1

## 2018-10-02 MED ORDER — DIPHENHYDRAMINE HCL 12.5 MG/5ML PO ELIX: 12.5000 mg | ORAL_SOLUTION | Freq: Four times a day (QID) | ORAL | Status: DC | PRN

## 2018-10-02 MED ORDER — POLYETHYLENE GLYCOL 3350 17 G PO PACK: 17.0000 g | PACK | Freq: Every day | ORAL | Status: DC | PRN

## 2018-10-02 NOTE — H&P (Signed)
Physical Medicine and Rehabilitation Admission H&P    CC: Functional decline    HPI: Erik Morales is a 78 year old male with history of T2DM, HTN, tobacco abuse who was admitted on 09/25/18 after being found in the ditch by wife with mild cognitive deficits--unknown period of time down.  He was noted to have elevated troponin at 15.7, acute renal failure, leukocytosis with WBC 24,800 and hemoglobin 8.2.  He was started on fluids due to lactic acidosis and treated for urosepsis due to E. coli UTI.  Chest x-ray did show question of pneumonia therefore antibiotic coverage broadened.  Cardiology felt elevated troponin due to demand ischemia but he did develop PAF/flutter treated with Cardizem and amiodarone for rate control with addition of  Eliquis for anticoagulation.  He developed GI bleed with bloody stool drop in hemoglobin to 5.7 as well as hypotension with confusion.  He was transferred to [facility] for higher level of care.  He was treated with multiple units packed red blood cells and GI consulted for input. Dr Pincus Sanes recommended direct reversal of oral anticoagulation as well as Protonix drip and every 6 hours CBC for monitoring.  Cardiology/Dr. Margaretann Loveless consulted for input and felt that recurrent rise in troponin's due to NSTEMI and patient was not a candidate for invasive ischemic work-up--- to follow-up with cardiology in a month.  He underwent EGD on 2/8 revealing widely patent Schatzki's ring at the GE junction, large hiatal hernia, 1 nonbleeding cratered duodenal ulcer with visible vessel that was injected with epinephrine.  He recommended full liquid diet and check a hematocrit x3 as well as avoiding aspirin antiplatelet agents or anticoagulation for now.  Melena resolving and he was advanced to regular diet on 2/9 with recommendations to continue PPI bid for 4 weeks then daily and to "avoid all NSAIDs moving forward". Serial H/H stable and patient cleared to start  anticoagulation ion 2/25.  Patient with QT prolongation and pharmacy recommends following closely for interaction. He completes antibiotic regimen today. Therapy ongoing and patient limited by BLE weakness and debility. CIR recommended for follow up therapy.   Review of Systems  Constitutional: Negative for chills and fever.  HENT: Negative for hearing loss and tinnitus.   Eyes: Negative for blurred vision and double vision.  Respiratory: Positive for cough and sputum production.   Cardiovascular: Negative for chest pain and palpitations.  Gastrointestinal: Negative for constipation, heartburn and nausea.  Genitourinary: Negative for dysuria and urgency.       Nocturia X 3  Musculoskeletal: Positive for joint pain. Negative for myalgias.  Neurological: Positive for weakness. Negative for dizziness, focal weakness and headaches.  Psychiatric/Behavioral: The patient is not nervous/anxious and does not have insomnia.      Past Medical History:  Diagnosis Date  . DM2 (diabetes mellitus, type 2) ([place])   . HLD (hyperlipidemia)   . HTN (hypertension)     Past Surgical History:  Procedure Laterality Date  . ESOPHAGOGASTRODUODENOSCOPY (EGD) WITH PROPOFOL N/A 09/27/2018   Procedure: ESOPHAGOGASTRODUODENOSCOPY (EGD) WITH PROPOFOL;  Surgeon: Doran Stabler, MD;  Location: [place];  Service: Gastroenterology;  Laterality: N/A;  . HOT HEMOSTASIS N/A 09/27/2018   Procedure: HOT HEMOSTASIS (ARGON PLASMA COAGULATION/BICAP);  Surgeon: Doran Stabler, MD;  Location: [place];  Service: Gastroenterology;  Laterality: N/A;  . SUBMUCOSAL INJECTION  09/27/2018   Procedure: SUBMUCOSAL INJECTION;  Surgeon: Doran Stabler, MD;  Location: [place];  Service: Gastroenterology;;    [facility]  History  Problem Relation Age of Onset  . Diabetes Son     Social History:  reports that he has quit smoking. He does not have any smokeless tobacco history on file. He reports that he does not drink  alcohol or use drugs.    Allergies: No Known Allergies    Medications Prior to Admission  Medication Sig Dispense Refill  . acetaminophen (TYLENOL) 500 MG tablet Take 500 mg by mouth every 6 (six) hours as needed for headache (pain).    Marland Kitchen atenolol (TENORMIN) 100 MG tablet Take 100 mg by mouth daily.    . calcium elemental as carbonate (BARIATRIC TUMS ULTRA) 400 MG chewable tablet Chew 1,000 mg by mouth 3 (three) times daily as needed for heartburn (indigestion).    . furosemide (LASIX) 20 MG tablet Take 20 mg by mouth 2 (two) times daily.    Marland Kitchen glimepiride (AMARYL) 2 MG tablet Take 2 mg by mouth daily.    Marland Kitchen latanoprost (XALATAN) 0.005 % ophthalmic solution Place 1 drop into both eyes at bedtime.    Marland Kitchen lisinopril (PRINIVIL,ZESTRIL) 20 MG tablet Take 20 mg by mouth daily.    . montelukast (SINGULAIR) 10 MG tablet Take 10 mg by mouth at bedtime as needed (allergies).    . Multiple Vitamin (MULTIVITAMIN WITH MINERALS) TABS tablet Take 1 tablet by mouth daily. Centrum Silver    . pioglitazone (ACTOS) 30 MG tablet Take 30 mg by mouth daily.    . potassium chloride (K-DUR) 10 MEQ tablet Take 10 mEq by mouth daily.    . pravastatin (PRAVACHOL) 20 MG tablet Take 20 mg by mouth daily with supper.    . terazosin (HYTRIN) 5 MG capsule Take 5 mg by mouth daily with supper.    . Vitamin D, Ergocalciferol, (DRISDOL) 1.25 MG (50000 UT) CAPS capsule Take 50,000 Units by mouth every Wednesday.      Drug Regimen Review Drug regimen was reviewed and the following issues were identified and addressed anticoagulant, antihypertensive meds  Home: Home Living Family/patient expects to be discharged to:: Private residence Living Arrangements: Spouse/significant other Available Help at Discharge: Family, Available 24 hours/day Type of Home: Mobile home Home Access: Stairs to enter Entrance Stairs-Number of Steps: 5 Entrance Stairs-Rails: Right, Left Home Layout: One level Bathroom Shower/Tub: Tub/shower  unit, Multimedia programmer: Standard Home Equipment: None   Functional History: Prior Function Level of Independence: Independent Comments: Still driving and working, helping care for wife with mild cognitive deficits  Functional Status:  Mobility: Bed Mobility Overal bed mobility: Needs Assistance Bed Mobility: Supine to Sit Rolling: Min guard Sidelying to sit: Min guard, HOB elevated Supine to sit: Mod assist Sit to supine: Mod assist, HOB elevated General bed mobility comments: With [facility] elevated he was able to achieve sitting side of bed with min guard.   Transfers Overall transfer level: Needs assistance Equipment used: Rolling walker (2 wheeled) Transfers: Sit to/from Stand Sit to Stand: Min assist General transfer comment: Pt required min assistance from elevated surface height of bed, Pt is slow and guarded but able to achieve standing with improved ease from elevate surface height.  Ambulation/Gait Ambulation/Gait assistance: Mod assist Gait Distance (Feet): 100 Feet Assistive device: Rolling walker (2 wheeled) Gait Pattern/deviations: Wide base of support, Decreased stride length, Decreased dorsiflexion - right, Decreased dorsiflexion - left, Step-to pattern, Shuffle General Gait Details: Cues for foot clearance, posture and upper trunk control.  Pt with LOB to the R required moderate assistance to correct.  Gait velocity: decreased    ADL: ADL Overall ADL's : Needs assistance/impaired Eating/Feeding: Supervision/ safety, Sitting Grooming: Wash/dry hands, Wash/dry face, Oral care, Sitting, Supervision/safety Upper Body Bathing: Min guard, Sitting Lower Body Bathing: Maximal assistance, Sit to/from stand Upper Body Dressing : Min guard, Sitting Lower Body Dressing: Maximal assistance, Sit to/from stand Toilet Transfer: Maximal assistance, RW, Ambulation Toileting- Clothing Manipulation and Hygiene: Sit to/from stand, Moderate assistance Toileting -  Clothing Manipulation Details (indicate cue type and reason): Pt able to complete 75% of peri cleansing from seated level, physical assist needed during standing Functional mobility during ADLs: Maximal assistance, Rolling walker General ADL Comments: Max A during functional mobility to bathroom. Unable to don socks sitting, even with starting over toes.   Cognition: Cognition Overall Cognitive Status: Impaired/Different from baseline Orientation Level: Oriented X4 Cognition Arousal/Alertness: Awake/alert Behavior During Therapy: WFL for tasks assessed/performed Overall Cognitive Status: Impaired/Different from baseline Area of Impairment: Safety/judgement Safety/Judgement: Decreased awareness of safety, Decreased awareness of deficits General Comments: Unaware of deficits   Blood pressure 119/60, pulse 69, temperature 97.8 F (36.6 C), temperature source Oral, resp. rate 16, height 5\' 10"  (1.778 m), weight 112 kg, SpO2 99 %. Physical Exam  Nursing note and vitals reviewed. Constitutional: He is oriented to person, place, and time. He appears well-developed and well-nourished.  Musculoskeletal:     Comments:   Erythema and tenderness at left great toe MTP joint--question gout. Tenderness left hand at prior IV site. No erythema or edema.  2+ pitting edema bilateral feet/ankles  Neurological: He is alert and oriented to person, place, and time.    General: No acute distress Mood and affect are appropriate Heart: Regular rate and rhythm no rubs murmurs or extra sounds Lungs: Clear to auscultation, breathing unlabored, no rales or wheezes Abdomen: Positive bowel sounds, soft nontender to palpation, nondistended Extremities: No clubbing, cyanosis, or edema Skin: No evidence of breakdown, no evidence of rash GU - condom cath, no scrotal edema Neurologic: Cranial nerves II through XII intact, motor strength is 5/5 in bilateral deltoid, bicep, tricep, grip, hip flexor, knee extensors,  ankle dorsiflexor and plantar flexor Sensory exam normal sensation to light touchbilateral upper and lower extremities Cerebellar exam normal finger to nose to finger as well as heel to shin in bilateral upper and lower extremities Musculoskeletal: Full range of motion in all 4 extremities Results for orders placed or performed during the hospital encounter of 09/25/18 (from the past 48 hour(s))  Glucose, capillary     Status: Abnormal   Collection Time: 09/30/18  5:46 PM  Result Value Ref Range   Glucose-Capillary 118 (H) 70 - 99 mg/dL  Glucose, capillary     Status: Abnormal   Collection Time: 09/30/18  8:06 PM  Result Value Ref Range   Glucose-Capillary 160 (H) 70 - 99 mg/dL  Glucose, capillary     Status: None   Collection Time: 10/01/18 12:19 AM  Result Value Ref Range   Glucose-Capillary 93 70 - 99 mg/dL  Glucose, capillary     Status: None   Collection Time: 10/01/18  4:45 AM  Result Value Ref Range   Glucose-Capillary 96 70 - 99 mg/dL  Glucose, capillary     Status: None   Collection Time: 10/01/18  8:02 AM  Result Value Ref Range   Glucose-Capillary 94 70 - 99 mg/dL  CBC     Status: Abnormal   Collection Time: 10/01/18 11:29 AM  Result Value Ref Range   WBC 14.5 (H) 4.0 - 10.5  K/uL   RBC 2.87 (L) 4.22 - 5.81 MIL/uL   Hemoglobin 8.4 (L) 13.0 - 17.0 g/dL   HCT 26.9 (L) 39.0 - 52.0 %   MCV 93.7 80.0 - 100.0 fL   MCH 29.3 26.0 - 34.0 pg   MCHC 31.2 30.0 - 36.0 g/dL   RDW 15.2 11.5 - 15.5 %   Platelets 244 150 - 400 K/uL   nRBC 0.3 (H) 0.0 - 0.2 %    Comment: Performed at [facility] [address] [place], [place] [address]  Glucose, capillary     Status: Abnormal   Collection Time: 10/01/18 11:50 AM  Result Value Ref Range   Glucose-Capillary 132 (H) 70 - 99 mg/dL  Glucose, capillary     Status: Abnormal   Collection Time: 10/01/18  4:22 PM  Result Value Ref Range   Glucose-Capillary 146 (H) 70 - 99 mg/dL  Glucose, capillary     Status: Abnormal    Collection Time: 10/01/18  8:19 PM  Result Value Ref Range   Glucose-Capillary 127 (H) 70 - 99 mg/dL  Glucose, capillary     Status: Abnormal   Collection Time: 10/02/18 12:08 AM  Result Value Ref Range   Glucose-Capillary 109 (H) 70 - 99 mg/dL  Glucose, capillary     Status: Abnormal   Collection Time: 10/02/18  4:43 AM  Result Value Ref Range   Glucose-Capillary 110 (H) 70 - 99 mg/dL  CBC with Differential/Platelet     Status: Abnormal   Collection Time: 10/02/18  4:49 AM  Result Value Ref Range   WBC 12.7 (H) 4.0 - 10.5 K/uL   RBC 2.90 (L) 4.22 - 5.81 MIL/uL   Hemoglobin 8.6 (L) 13.0 - 17.0 g/dL   HCT 27.0 (L) 39.0 - 52.0 %   MCV 93.1 80.0 - 100.0 fL   MCH 29.7 26.0 - 34.0 pg   MCHC 31.9 30.0 - 36.0 g/dL   RDW 15.1 11.5 - 15.5 %   Platelets 261 150 - 400 K/uL   nRBC 0.4 (H) 0.0 - 0.2 %   Neutrophils Relative % 78 %   Neutro Abs 9.9 (H) 1.7 - 7.7 K/uL   Lymphocytes Relative 14 %   Lymphs Abs 1.8 0.7 - 4.0 K/uL   Monocytes Relative 3 %   Monocytes Absolute 0.4 0.1 - 1.0 K/uL   Eosinophils Relative 1 %   Eosinophils Absolute 0.1 0.0 - 0.5 K/uL   Basophils Relative 0 %   Basophils Absolute 0.0 0.0 - 0.1 K/uL   nRBC 0 0 /100 WBC   Metamyelocytes Relative 3 %   Myelocytes 1 %   Abs Immature Granulocytes 0.50 (H) 0.00 - 0.07 K/uL   Burr Cells PRESENT    Polychromasia PRESENT     Comment: Performed at [facility], [address], [place],  [address]  Basic metabolic panel     Status: Abnormal   Collection Time: 10/02/18  4:49 AM  Result Value Ref Range   Sodium 140 135 - 145 mmol/L   Potassium 4.2 3.5 - 5.1 mmol/L   Chloride 104 98 - 111 mmol/L   CO2 28 22 - 32 mmol/L   Glucose, Bld 123 (H) 70 - 99 mg/dL   BUN 15 8 - 23 mg/dL   Creatinine, Ser 1.64 (H) 0.61 - 1.24 mg/dL   Calcium 7.8 (L) 8.9 - 10.3 mg/dL   GFR calc non Af Amer 39 (L) >60 mL/min   GFR calc Af Amer 45 (  L) >60 mL/min   Anion gap 8 5 - 15    Comment: Performed at [facility] [address], [place],  [address]  Magnesium     Status: None   Collection Time: 10/02/18  4:49 AM  Result Value Ref Range   Magnesium 1.9 1.7 - 2.4 mg/dL    Comment: Performed at [facility] [address], [place], [place] [address]  Glucose, capillary     Status: Abnormal   Collection Time: 10/02/18  8:42 AM  Result Value Ref Range   Glucose-Capillary 101 (H) 70 - 99 mg/dL  Glucose, capillary     Status: Abnormal   Collection Time: 10/02/18 12:48 PM  Result Value Ref Range   Glucose-Capillary 133 (H) 70 - 99 mg/dL   No results found.     Medical Problem List and Plan: 1.  Debility  secondary to PNA, NSTEMI, GI bleed requiring transfusion 2.  DVT Prophylaxis/Anticoagulation: Mechanical: Sequential compression devices, below knee Bilateral lower extremities 3. Pain Management: tylenol prn  4. Mood: LCSW to follow for evaluation and support.  5. Neuropsych: This patient is capable of making decisions on his own behalf. 6. MASD/Skin/Wound Care: Routine pressure relief measures.  7. Fluids/Electrolytes/Nutrition: Monitor I/O. Check lytes in am. Add prostat. 8.E coli Urosepsis/ CAP: Completed 7 day course of antibiotic regimen today. Will check follow up CXR.  Continues to have congested cough. Incentive spiro ordered 9. GIB with melena: Continue to monitor for signs of bleeding and serial H/H. Continue Protonix BID for 4 wks then daily.  10. A fib/A flutter: Monitor HR bid. Continue amiodarone bid. .  11. Chronic diastolic CHF: Lasix resumed 2/11. Monitor weights daily and for other signs of overload. BP has been soft at times. 2+ peripheral edema 12. T2DM: Monitor BS ac/hs. Continue SSI for now. Resume Amaryl as intake improves.  13. Leucocytosis: Resolving. Monitor for signs of infection.   Post Admission Physician Evaluation: 1. Functional deficits secondary  to Debility from PNA, NSTEMI, GIB. 2. Patient admitted to receive collaborative, interdisciplinary care  between the physiatrist, rehab nursing staff, and therapy team. 3. Patient's level of medical complexity and substantial therapy needs in context of that medical necessity cannot be provided at a lesser intensity of care. 4. Patient has experienced substantial functional loss from his/her baseline. Judging by the patient's diagnosis, physical exam, and functional history, the patient has potential for functional progress which will result in measurable gains while on inpatient rehab.  These gains will be of substantial and practical use upon discharge in facilitating mobility and self-care at the household level. 5. Physiatrist will provide 24 hour management of medical needs as well as oversight of the therapy plan/treatment and provide guidance as appropriate regarding the interaction of the two. 6. 24 hour rehab nursing will assist in the management of  bladder management, bowel management, safety, skin/wound care, disease management, medication administration, pain management and patient education  and help integrate therapy concepts, techniques,education, etc. 7. PT will assess and treat for: DME instruction, Functional mobility training, Balance training, Patient/family education, Gait training, Therapeutic activities, Stair training, Therapeutic exercise.  Goals are: independent with assistive device. 8. OT will assess and treat for Self-care/ADL training, Therapeutic exercise, Energy conservation, DME and/or AE instruction, Balance training, Patient/family education, Cognitive remediation/compensation, Therapeutic activities, Modalities, Manual therapy.  Goals are: minimal assist.  9. SLP will assess and treat for  .  Goals are: supervision. 10. Case Management and Social Worker will assess  and treat for psychological issues and discharge planning. 11. Team conference will be held weekly to assess progress toward goals and to determine barriers to discharge. 12.  Patient will receive at least 3  hours of therapy per day at least 5 days per week. 13. ELOS and Prognosis: 14-17d excellent      "I have personally performed a face to face diagnostic evaluation of this patient.  Additionally, I have reviewed and concur with the physician assistant's documentation above." Charlett Blake M.D. [facility] FAAPM&R (Sports Med, Neuromuscular Med) Diplomate Am Board of [place], PA-C 10/02/2018

## 2018-10-02 NOTE — Progress Notes (Signed)
Patient was admitted to 2QU41. Arrived with granddaughter. Patient alert and oriented. Patient reports no pain. All questions were answered and patient left in bed visiting family with call bell and phone within reach.  Doy Hutching, LPN

## 2018-10-02 NOTE — Progress Notes (Addendum)
Physical Therapy Treatment Patient Details Name: Erik Morales MRN: [id number] DOB: 1941-01-24 Today's Date: 10/02/2018    History of Present Illness Pt is a 78 y.o. male with a hx of DM, HTN, and HLD. He was admitted to [facility] for several days after presenting with AMS and hypothermia, found to have sepsis with UTI/PNA, acute kidney injury with Cr 3.3, increased troponin, anemia, and new onset PAF. He was apparently found in a ditch after having been outside for an unknown period of time. The patient states he had ridden his lawnmower into the ditch and had to try and flag someone down to help him. He was reported to be confused with altered mental status upon arrival to [facility] ER. He was transferred to [facility] after developing GIB with worsening anemia on anticoagulation.      PT Comments    Pt performed gait training with moderate assistance and LOB to the R during a R turn.  Pt is motivated to get OOB and progress mobility.  He is improving but continues to require elevation of HOB and use of railing to sit edge of bed.  Pt required elevation of seat height as well to improve ease of transfers.  Pt is not able to have care needs met at home and is agreeable to placement in post acute setting.  Will inform supervising PT of change in recommendations back to CIR as PTA read consult incorrectly from CIR that read he was appropriate for work-up.  Pt is highly motivated and would benefit from aggreessive therapy in CIR setting to improve strength and function before returning home.    Supervision/Assistance - 24 hour;CIR     Equipment Recommendations  Rolling walker with 5" wheels    Recommendations for Other Services       Precautions / Restrictions Precautions Precautions: Fall Restrictions Weight Bearing Restrictions: No    Mobility  Bed Mobility Overal bed mobility: Needs Assistance Bed Mobility: Supine to Sit Rolling: Min guard Sidelying to sit: Min guard;HOB  elevated       General bed mobility comments: With [facility] elevated he was able to achieve sitting side of bed with min guard.    Transfers Overall transfer level: Needs assistance Equipment used: Rolling walker (2 wheeled) Transfers: Sit to/from Stand Sit to Stand: Min assist         General transfer comment: Pt required min assistance from elevated surface height of bed, Pt is slow and guarded but able to achieve standing with improved ease from elevate surface height.   Ambulation/Gait Ambulation/Gait assistance: Mod assist Gait Distance (Feet): 100 Feet Assistive device: Rolling walker (2 wheeled) Gait Pattern/deviations: Wide base of support;Decreased stride length;Decreased dorsiflexion - right;Decreased dorsiflexion - left;Step-to pattern;Shuffle Gait velocity: decreased   General Gait Details: Cues for foot clearance, posture and upper trunk control.  Pt with LOB to the R required moderate assistance to correct.     Stairs             Wheelchair Mobility    Modified Rankin (Stroke Patients Only)       Balance Overall balance assessment: Needs assistance   Sitting balance-Leahy Scale: Fair       Standing balance-Leahy Scale: Poor                              Cognition Arousal/Alertness: Awake/alert Behavior During Therapy: WFL for tasks assessed/performed Overall Cognitive Status: Impaired/Different from baseline Area of Impairment:  Safety/judgement                         Safety/Judgement: Decreased awareness of safety;Decreased awareness of deficits     General Comments: Unaware of deficits      Exercises      General Comments        Pertinent Vitals/Pain Pain Assessment: Faces Faces Pain Scale: Hurts even more Pain Location: L wrist ( lateral ) Pain Descriptors / Indicators: Guarding;Grimacing Pain Intervention(s): Monitored during session;Repositioned    Home Living                      Prior  Function            PT Goals (current goals can now be found in the care plan section) Acute Rehab PT Goals Patient Stated Goal: "To get out of this bed." Potential to Achieve Goals: Good Progress towards PT goals: Progressing toward goals    Frequency           PT Plan Current plan remains appropriate    Co-evaluation              AM-PAC PT "6 Clicks" Mobility   Outcome Measure  Help needed turning from your back to your side while in a flat bed without using bedrails?: A Little Help needed moving from lying on your back to sitting on the side of a flat bed without using bedrails?: A Little Help needed moving to and from a bed to a chair (including a wheelchair)?: A Little Help needed standing up from a chair using your arms (e.g., wheelchair or bedside chair)?: A Little Help needed to walk in hospital room?: A Lot Help needed climbing 3-5 steps with a railing? : A Lot 6 Click Score: 16    End of Session Equipment Utilized During Treatment: Gait belt Activity Tolerance: Patient tolerated treatment well Patient left: with call bell/phone within reach;in chair;with chair alarm set Nurse Communication: Mobility status PT Visit Diagnosis: Muscle weakness (generalized) (M62.81);Difficulty in walking, not elsewhere classified (R26.2);Pain Pain - Right/Left: Right     Time: 2683-4196 PT Time Calculation (min) (ACUTE ONLY): 23 min  Charges:  $Gait Training: 8-22 mins $Therapeutic Activity: 8-22 mins                     Governor Rooks, PTA Acute Rehabilitation Services Pager [phone] Office [phone]     Jovan Schickling Eli Hose 10/02/2018, 11:52 AM

## 2018-10-02 NOTE — PMR Pre-admission (Signed)
PMR Admission Coordinator Pre-Admission Assessment  Patient: Erik Morales is an 78 y.o., male MRN: [id number] DOB: February 18, 1941 Height: 5' 10"  (177.8 cm) Weight: 112 kg              Insurance Information HMO: Yes   PPO:       PCP:       IPA:       80/20:       OTHER:   PRIMARY:  [employer] medicare      Policy#: [id number]      Subscriber: patient CM Name: Blanch Media      Phone#:       Fax#: [phone] Pre-Cert#: [id number]      Employer: Retired Benefits:  Phone #: [phone]     Name: On line portal Eff. Date: 08/20/18     Deduct: $0      Out of Pocket Max: $4500 (met $0)      Life Max: N/A CIR: $325 days 1-5      SNF: $0 days 1-20; $160 days 21-49; $0 days 50-100 Outpatient: medical necessity     Co-Pay: $35/visit Home Health: 100%      Co-Pay: none DME: 80%     Co-Pay: 20% Providers: in network  Medicaid Application Date:        Case Manager:   Disability Application Date:        Case Worker:    Emergency Contact Information Contact Information    Name Relation Home Work [place], Janett Billow Granddaughter   [phone]   Gerda Diss  [phone]   [place], [place]  [phone]     Current Medical History  Patient Admitting Diagnosis: Debility  History of Present Illness:  A 78 y.o.right handed male with history of type 2 diabetes mellitus, hyperlipidemia, hypertension as well as history of tobacco use. Per chart review, granddaughter, and patient, patient lives with spouse. Mobile home with 5 steps to entry. Independent prior to admission.Presented to [facility] 09/25/2018 with altered mental status. By report patient was found in a ditch by his wife had been there for an unknown period of time. Troponin 15.74,creatinine 1.86, BUN 56,WBC 24,800, hemoglobin 8.2.. Patient developed atrial fibrillation with RVR and placed on Eliquis per cardiology services and transferred to [facility]. Chest x-ray showed small bilateral pleural  effusions and questionable right lower lobe infiltrate. Renal ultrasound no hydronephrosis. Suspected GI bleed with noted hemoglobin 8.2 receive 4 units of packed red blood cells and his Eliquis was reversed. Underwent EGD showing nonbleeding duodenal ulcer with a visible vessel. Placed on PPI. Recommendations are to resume Eliquis 10/14/2018. Elevated troponin most likely secondary NSTEMI due to demand ischemia. Cardiac rate control with amiodarone. Patient completing course of IV antibiotics for sepsis due to pneumonia. Tolerating a regular consistency diet. Therapy evaluations completed with recommendations of physical medicine rehabilitation consult.    Past Medical History  Past Medical History:  Diagnosis Date  . DM2 (diabetes mellitus, type 2) ([place])   . HLD (hyperlipidemia)   . HTN (hypertension)     Family History  family history includes Diabetes in his son.  Prior Rehab/Hospitalizations: No previous rehab  Has the patient had major surgery during 100 days prior to admission? No  Current Medications   Current Facility-Administered Medications:  .  acetaminophen (TYLENOL) tablet 650 mg, 650 mg, Oral, Q6H PRN **OR** [DISCONTINUED] acetaminophen (TYLENOL) suppository 650 mg, 650 mg, Rectal, Q6H PRN, Etta Quill, DO .  amiodarone (PACERONE) tablet 200  mg, 200 mg, Oral, BID, Thurnell Lose, MD, 200 mg at 10/02/18 1056 .  amoxicillin-clavulanate (AUGMENTIN) 875-125 MG per tablet 1 tablet, 1 tablet, Oral, Q12H, Thurnell Lose, MD, 1 tablet at 10/02/18 1056 .  furosemide (LASIX) tablet 20 mg, 20 mg, Oral, BID, Thurnell Lose, MD, 20 mg at 10/02/18 0724 .  guaiFENesin-dextromethorphan (ROBITUSSIN DM) 100-10 MG/5ML syrup 5 mL, 5 mL, Oral, Q4H PRN, Thurnell Lose, MD, 5 mL at 09/28/18 2045 .  insulin aspart (novoLOG) injection 0-9 Units, 0-9 Units, Subcutaneous, Q4H, Nelida Meuse III, MD, 1 Units at 10/01/18 2035 .  montelukast (SINGULAIR) tablet 10 mg, 10 mg, Oral, QHS  PRN, Thurnell Lose, MD .  pantoprazole (PROTONIX) EC tablet 40 mg, 40 mg, Oral, BID AC, Nelida Meuse III, MD, 40 mg at 10/02/18 3536  Patients Current Diet:  Diet Order            Diet - low sodium heart healthy        Diet Carb Modified        Diet Carb Modified Fluid consistency: Thin; Room service appropriate? Yes; Fluid restriction: 1500 mL Fluid  Diet effective now              Precautions / Restrictions Precautions Precautions: Fall Restrictions Weight Bearing Restrictions: No   Has the patient had 2 or more falls or a fall with injury in the past year?No  Prior Activity Level Community (5-7x/wk): Went out daily, was driving.  Home Assistive Devices / Equipment Home Equipment: None  Prior Device Use: Indicate devices/aids used by the patient prior to current illness, exacerbation or injury? None  Prior Functional Level Prior Function Level of Independence: Independent Comments: Still driving and working, helping care for wife with mild cognitive deficits  Self Care: Did the patient need help bathing, dressing, using the toilet or eating?  Independent  Indoor Mobility: Did the patient need assistance with walking from room to room (with or without device)? Independent  Stairs: Did the patient need assistance with internal or external stairs (with or without device)? Independent  Functional Cognition: Did the patient need help planning regular tasks such as shopping or remembering to take medications? Independent  Current Functional Level Cognition  Overall Cognitive Status: Impaired/Different from baseline Orientation Level: Oriented X4 Safety/Judgement: Decreased awareness of safety, Decreased awareness of deficits General Comments: Unaware of deficits    Extremity Assessment (includes Sensation/Coordination)  Upper Extremity Assessment: Generalized weakness  Lower Extremity Assessment: Generalized weakness    ADLs  Overall ADL's : Needs  assistance/impaired Eating/Feeding: Supervision/ safety, Sitting Grooming: Wash/dry hands, Wash/dry face, Oral care, Sitting, Supervision/safety Upper Body Bathing: Min guard, Sitting Lower Body Bathing: Maximal assistance, Sit to/from stand Upper Body Dressing : Min guard, Sitting Lower Body Dressing: Maximal assistance, Sit to/from stand Toilet Transfer: Maximal assistance, RW, Ambulation Toileting- Clothing Manipulation and Hygiene: Sit to/from stand, Moderate assistance Toileting - Clothing Manipulation Details (indicate cue type and reason): Pt able to complete 75% of peri cleansing from seated level, physical assist needed during standing Functional mobility during ADLs: Maximal assistance, Rolling walker General ADL Comments: Max A during functional mobility to bathroom. Unable to don socks sitting, even with starting over toes.     Mobility  Overal bed mobility: Needs Assistance Bed Mobility: Supine to Sit Rolling: Min guard Sidelying to sit: Min guard, HOB elevated Supine to sit: Mod assist Sit to supine: Mod assist, HOB elevated General bed mobility comments: With [facility] elevated he was able  to achieve sitting side of bed with min guard.      Transfers  Overall transfer level: Needs assistance Equipment used: Rolling walker (2 wheeled) Transfers: Sit to/from Stand Sit to Stand: Min assist General transfer comment: Pt required min assistance from elevated surface height of bed, Pt is slow and guarded but able to achieve standing with improved ease from elevate surface height.     Ambulation / Gait / Stairs / Wheelchair Mobility  Ambulation/Gait Ambulation/Gait assistance: Mod assist Gait Distance (Feet): 100 Feet Assistive device: Rolling walker (2 wheeled) Gait Pattern/deviations: Wide base of support, Decreased stride length, Decreased dorsiflexion - right, Decreased dorsiflexion - left, Step-to pattern, Shuffle General Gait Details: Cues for foot clearance, posture and  upper trunk control.  Pt with LOB to the R required moderate assistance to correct.   Gait velocity: decreased    Posture / Balance Balance Overall balance assessment: Needs assistance Sitting-balance support: No upper extremity supported, Feet supported Sitting balance-Leahy Scale: Fair Standing balance support: Bilateral upper extremity supported, During functional activity Standing balance-Leahy Scale: Poor Standing balance comment: reliant on RW     Special needs/care consideration BiPAP/CPAP No CPM No Continuous Drip IV No Dialysis No      Life Vest No Oxygen No Special Bed No Trach Size No Wound Vac (area) No    Skin Has bruising on arms and a pressure injury                            Bowel mgmt: Last BM 10/01/18 Bladder mgmt: condom catheter Diabetic mgmt Yes, on oral medication at home    Previous Home Environment Living Arrangements: Spouse/significant other Available Help at Discharge: Family, Available 24 hours/day Type of Home: Mobile home Home Layout: One level Home Access: Stairs to enter Entrance Stairs-Rails: Right, Left Entrance Stairs-Number of Steps: 5 Bathroom Shower/Tub: Tub/shower unit, Multimedia programmer: Standard  Discharge Living Setting Plans for Discharge Living Setting: Patient's home, Lives with (comment), Mobile Home(Lives with wife.) Type of Home at Discharge: Mobile home(double wide) Discharge Home Layout: One level Discharge Home Access: Stairs to enter Entrance Stairs-Number of Steps: 3 steps at front plus a threshold entry step Discharge Bathroom Shower/Tub: Tub/shower unit, Walk-in shower, Curtain(Has both tub/shower and walk in shower) Discharge Bathroom Toilet: Standard Discharge Bathroom Accessibility: Yes How Accessible: Accessible via walker  Social/Family/Support Systems Patient Roles: Spouse, Other (Comment)(Has a wife and 4 grand children, son deceased.) Contact Information: Demarrius Guerrero - grand daughter -  [phone] Anticipated Caregiver: 4 grand children to rotate caregiver duties after discharge Ability/Limitations of Caregiver: son is deceased.  Four grand children are sharing staying with patient at discharge Caregiver Availability: Other (Comment)(Patient and grand daughter aware of potential need for 24/7 assistance ) Discharge Plan Discussed with Primary Caregiver: Yes Is Caregiver In Agreement with Plan?: Yes Does Caregiver/Family have Issues with Lodging/Transportation while Pt is in Rehab?: No  Goals/Additional Needs Patient/Family Goal for Rehab: PT/OT supervision to min assist goals (patient hopeful to achieve Mod I to supervision prior to home) Expected length of stay: 14-17 days Cultural Considerations: None Dietary Needs: Carb mod, thin liquids Equipment Needs: TBD Pt/Family Agrees to Admission and willing to participate: Yes Program Orientation Provided & Reviewed with Pt/Caregiver Including Roles  & Responsibilities: Yes  Decrease burden of Care through IP rehab admission: N/A  Possible need for SNF placement upon discharge: Not anticipated  Patient Condition: This patient's medical and functional status has changed since  the consult dated: 09/29/18 in which the Rehabilitation Physician determined and documented that the patient's condition is appropriate for intensive rehabilitative care in an inpatient rehabilitation facility. See "History of Present Illness" (above) for medical update. Functional changes are: Currently requiring mod assist to ambulate 100 feet RW. Patient's medical and functional status update has been discussed with the Rehabilitation physician and patient remains appropriate for inpatient rehabilitation. Will admit to inpatient rehab today.  Preadmission Screen Completed By:  Retta Diones, 10/02/2018 1:08 PM ______________________________________________________________________   Discussed status with Dr. Letta Pate on 10/02/18 at 1308 and received  telephone approval for admission today.  Admission Coordinator:  Retta Diones, time 1308/Date 10/02/18

## 2018-10-02 NOTE — Progress Notes (Signed)
Patient ID: Erik Morales, male   DOB: [birth year], 78 y.o.   MRN: [id number]  PROGRESS NOTE    Erik Morales  TXM:[id number] DOB: Nov 08, 1940 DOA: 09/25/2018 PCP: Patient, No Pcp Per   Brief Narrative:  78 year old male with history of diabetes mellitus, hypertension was initially admitted to [facility] for sepsis with toxic encephalopathy due to pneumonia/UTI.  He then developed A. fib with RVR along with elevated troponin up to around 5 and was seen by cardiology.  He was started on Eliquis but subsequently developed bright red blood per rectum.  He was transferred to [facility] on 09/25/2018 for further care as GI coverage was not available at [facility].  GI was consulted.  Assessment & Plan:   Principal Problem:   Hematochezia Active Problems:   AKI (acute kidney injury) ([place])   Severe sepsis (HCC)   Atrial fibrillation with RVR (HCC)   Elevated troponin   Abnormal thyroid function test   DM2 (diabetes mellitus, type 2) (HCC)   HTN (hypertension)   E-coli UTI   CAP (community acquired pneumonia)   Pressure injury of skin   Duodenal ulcer with hemorrhage   Melena   Acute blood loss anemia   Tobacco abuse   Leukocytosis   Upper GI bleeding causing acute blood loss anemia -Status post 4 units packed red cells during this admission.  Hemoglobin 8.6 today. -Status post EGD which showed nonbleeding duodenal ulcer with a visible vessel.  Continue oral Protonix twice a day.  Case was discussed with GI by prior hospitalist and GI recommends to resume Eliquis with caution on 10/14/2018 and refrain from all anticoagulants or antiplatelet agents till then. -Monitor H&H  Elevated troponin -From demand ischemia.  Not on any antiplatelets because of above.  Not on beta-blocker because of low blood pressure.  Seen by cardiology who recommended outpatient follow-up.  Currently chest pain-free.  Stable echocardiogram showing preserved EF and wall motion.  Paroxysmal atrial fibrillation   -Currently rate controlled.  Continue amiodarone.  Eliquis can be resumed on 10/14/2018 as per GI.  Outpatient follow-up with cardiology  Acute renal failure -Improving.  Renal ultrasound did not show hydronephrosis.  Monitor creatinine  Recently diagnosed sepsis due to right lower lobar pneumonia -Sepsis has resolved.  Initially was started on Rocephin and Zithromax and subsequently switched to Augmentin.  Stop Augmentin today. -Currently on room air   Generalized deconditioning -PT/OT recommended CIR.  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  Strict input and output.  Daily weights.  Continue Lasix.  Diabetes mellitus type 2 -Continue Accu-Cheks.  Blood sugar stable    DVT prophylaxis: SCDs Code Status: Full Family Communication: Granddaughter at bedside Disposition Plan: CIR versus SNF once bed is available  Consultants: GI/cardiology  Procedures:  EGD  Impression: - Widely patent Schatzki ring. - Large hiatal hernia. - Normal stomach. - One non-bleeding duodenal ulcer with a visible  vessel. Injected. Treated with bipolar cautery. - No specimens collected.  Recommendation:                             - Return patient to hospital ward for ongoing care. - Full liquid diet. - Continue present medications, including  pantoprazole drip. - H 8 hour Hgb and Hct x 3 , then further plan to  follow. H pylori serology No aspirin, antiplatelet agents or anticoagulants.  TTE -  1. The left ventricle has normal  systolic function of 54-00%. The cavity size was normal. There is mildly increased left ventricular wall thickness.  Echo evidence of impaired diastolic relaxation. 2. The right ventricle has normal systolic function. The cavity was normal. There is no increase in right ventricular wall thickness. 3. Left atrial size was mildly dilated. 4. The mitral valve is normal in structure. 5. The tricuspid valve is normal in structure. 6. The aortic valve is normal in structure. There is mild thickening and mild calcification of the aortic valve. 7. The pulmonic valve was normal in structure. 8. The aortic root and ascending aorta are normal in size and structure.  Antimicrobials:  Anti-infectives (From admission, onward)   Start     Dose/Rate Route Frequency Ordered Stop   09/28/18 1000  amoxicillin-clavulanate (AUGMENTIN) 875-125 MG per tablet 1 tablet     1 tablet Oral Every 12 hours 09/28/18 0940     09/25/18 2230  cefTRIAXone (ROCEPHIN) 2 g in sodium chloride 0.9 % 100 mL IVPB  Status:  Discontinued     2 g 200 mL/hr over 30 Minutes Intravenous Every 24 hours 09/25/18 2210 09/28/18 0940   09/25/18 2230  azithromycin (ZITHROMAX) 500 mg in sodium chloride 0.9 % 250 mL IVPB  Status:  Discontinued     500 mg 250 mL/hr over 60 Minutes Intravenous Every 24 hours 09/25/18 2210 09/28/18 0940       Subjective: Patient seen and examined at bedside.  He is awake, poor historian.  No overnight fever, nausea, vomiting, worsening shortness of breath.  Spoke with granddaughter at bedside Objective: Vitals:   10/01/18 0531 10/01/18 1458 10/01/18 2023 10/02/18 0447  BP: 96/69 116/62 (!) 107/58 119/60  Pulse: 67 80 69 69  Resp:  18 16 16   Temp:  98.1 F (36.7 C) (!) 97.5 F (36.4 C) 97.8 F (36.6 C)  TempSrc:  Oral Oral Oral  SpO2:  97% 98% 99%  Weight:      Height:        Intake/Output Summary (Last 24 hours) at 10/02/2018 1055 Last data filed at 10/02/2018 1023 Gross per 24 hour  Intake 240 ml  Output 2210 ml  Net -1970 ml   Filed Weights   09/26/18 0900  Weight: 112 kg     Examination:  General exam: Appears calm and comfortable.  No acute distress.  Poor historian respiratory system: Bilateral decreased breath sounds at bases, scattered crackles, no wheezing Cardiovascular system: S1 & S2 heard, Rate controlled Gastrointestinal system: Abdomen is nondistended, soft and nontender. Normal bowel sounds heard. Extremities: No cyanosis; trace edema  Data Reviewed: I have personally reviewed following labs and imaging studies  CBC: Recent Labs  Lab 09/28/18 0355 09/29/18 0354 09/29/18 1101 09/29/18 1903 09/30/18 0335 10/01/18 1129 10/02/18 0449  WBC 20.9* 17.9*  --   --  14.6* 14.5* 12.7*  NEUTROABS  --   --   --   --   --   --  9.9*  HGB 8.9* 8.6* 9.5* 8.9* 8.2* 8.4* 8.6*  HCT 27.9* 27.8* 29.2* 27.4* 25.3* 26.9* 27.0*  MCV 91.2 92.7  --   --  94.1 93.7 93.1  PLT 170 196  --   --  211 244 867   Basic Metabolic Panel: Recent Labs  Lab 09/25/18 2236 09/26/18 0522 09/27/18 0234 09/28/18 0355 10/02/18 0449  NA 142 142 146* 145 140  K 4.2 4.7 4.3 3.8 4.2  CL 111 112* 115* 114* 104  CO2 24 22 24 23 28   GLUCOSE  247* 244* 199* 136* 123*  BUN 56* 63* 56* 40* 15  CREATININE 1.86* 2.04* 1.67* 1.52* 1.64*  CALCIUM 7.5* 7.6* 7.7* 8.0* 7.8*  MG  --  1.8 1.9 2.0 1.9   GFR: Estimated Creatinine Clearance: 45.8 mL/min (A) (by C-G formula based on SCr of 1.64 mg/dL (H)). Liver Function Tests: Recent Labs  Lab 09/25/18 2236 09/27/18 0234 09/28/18 0355  AST 56* 26 25  ALT 25 21 20   ALKPHOS 35* 30* 33*  BILITOT 0.4 0.5 0.5  PROT 4.5* 4.1* 4.3*  ALBUMIN 1.9* 1.8* 2.0*   No results for input(s): LIPASE, AMYLASE in the last 168 hours. No results for input(s): AMMONIA in the last 168 hours. Coagulation Profile: Recent Labs  Lab 09/25/18 2236 09/27/18 0234  INR 1.42 1.12   Cardiac Enzymes: Recent Labs  Lab 09/25/18 2235 09/26/18 0522  TROPONINI 15.74* 10.23*   BNP (last 3 results) No results for input(s): PROBNP in the last 8760  hours. HbA1C: No results for input(s): HGBA1C in the last 72 hours. CBG: Recent Labs  Lab 10/01/18 1622 10/01/18 2019 10/02/18 0008 10/02/18 0443 10/02/18 0842  GLUCAP 146* 127* 109* 110* 101*   Lipid Profile: No results for input(s): CHOL, HDL, LDLCALC, TRIG, CHOLHDL, LDLDIRECT in the last 72 hours. Thyroid Function Tests: No results for input(s): TSH, T4TOTAL, FREET4, T3FREE, THYROIDAB in the last 72 hours. Anemia Panel: No results for input(s): VITAMINB12, FOLATE, FERRITIN, TIBC, IRON, RETICCTPCT in the last 72 hours. Sepsis Labs: No results for input(s): PROCALCITON, LATICACIDVEN in the last 168 hours.  No results found for this or any previous visit (from the past 240 hour(s)).       Radiology Studies: No results found.      Scheduled Meds: . amiodarone  200 mg Oral BID  . amoxicillin-clavulanate  1 tablet Oral Q12H  . furosemide  20 mg Oral BID  . insulin aspart  0-9 Units Subcutaneous Q4H  . pantoprazole  40 mg Oral BID AC   Continuous Infusions:   LOS: 7 days        Aline August, MD [facility] Pager [phone]  If 7PM-7AM, please contact night-coverage [email or web address] Password [facility] 10/02/2018, 10:55 AM

## 2018-10-02 NOTE — Progress Notes (Signed)
CSW received notice that patient has been accepted to CIR. CSW alerted [employer] to withdraw insurance authorization.   CSW signing off as no further needs present.   Erik Locus Adisyn Ruscitti LCSW [phone]

## 2018-10-02 NOTE — Progress Notes (Signed)
IP rehab admissions:  I have approval for acute inpatient rehab admission for today.  I met with patient and his grand daughter at the bedside.  They are agreeable.  I will contact attending MD for orders.  Call me for questions.  [phone]

## 2018-10-02 NOTE — Discharge Summary (Signed)
Physician Discharge Summary  ERSKINE STEINFELDT JSR:[id number] DOB: 1941/02/05 DOA: 09/25/2018  PCP: Patient, No Pcp Per  Admit date: 09/25/2018 Discharge date: 10/02/2018  Admitted From: Home Disposition:  CIR  Recommendations for Outpatient Follow-up:  1. Follow up with CIR provider at earliest convenience 2. Follow-up with cardiology and gastroenterology as an outpatient   [place]: No Equipment/Devices: None Discharge Condition: Stable CODE STATUS: Full Diet recommendation: Heart Healthy / Carb Modified   Brief/Interim Summary:78 year old male with history of diabetes mellitus, hypertension was initially admitted to [facility] for sepsis with toxic encephalopathy due to pneumonia/UTI.  He then developed A. fib with RVR along with elevated troponin up to around 5 and was seen by cardiology.  He was started on Eliquis but subsequently developed bright red blood per rectum.  He was transferred to [facility] on 09/25/2018 for further care as GI coverage was not available at [facility].  GI was consulted.  He underwent EGD and was put on oral Protonix subsequently.  He is currently not on any anticoagulation.  Cardiology recommended outpatient follow-up.  He is hemodynamically stable but is deconditioned and will be discharged to CIR once bed is available.  He has completed antibiotic treatment for pneumonia.  Discharge Diagnoses:  Principal Problem:   Hematochezia Active Problems:   AKI (acute kidney injury) ([place])   Severe sepsis (HCC)   Atrial fibrillation with RVR (HCC)   Elevated troponin   Abnormal thyroid function test   DM2 (diabetes mellitus, type 2) (HCC)   HTN (hypertension)   E-coli UTI   CAP (community acquired pneumonia)   Pressure injury of skin   Duodenal ulcer with hemorrhage   Melena   Acute blood loss anemia   Tobacco abuse   Leukocytosis  Upper GI bleeding causing acute blood loss anemia -Status post 4 units packed red cells during this admission.   Hemoglobin 8.6 today. -Status post EGD which showed nonbleeding duodenal ulcer with a visible vessel.  Continue oral Protonix twice a day.  Case was discussed with GI by prior hospitalist and GI recommends to resume Eliquis with caution on 10/14/2018 and refrain from all anticoagulants or antiplatelet agents till then. -Monitor H&H  Elevated troponin -From demand ischemia.  Not on any antiplatelets because of above.  Not on beta-blocker because of low blood pressure.  Seen by cardiology who recommended outpatient follow-up.  Currently chest pain-free.  Stable echocardiogram showing preserved EF and wall motion. -Outpatient follow-up with cardiology  Paroxysmal atrial fibrillation  -Currently rate controlled.  Continue amiodarone.  Eliquis can be resumed on 10/14/2018 as per GI.  Outpatient follow-up with cardiology  Acute renal failure -Improving.  Renal ultrasound did not show hydronephrosis.  Monitor creatinine  Recently diagnosed sepsis due to right lower lobar pneumonia -Sepsis has resolved.  Initially was started on Rocephin and Zithromax and subsequently switched to Augmentin.  Stop Augmentin today.  Patient will not need any more antibiotics on discharge -Currently on room air   Generalized deconditioning -PT/OT recommended CIR.   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  Strict input and output.  Daily weights.  Continue Lasix.  Diabetes mellitus type 2 -Blood sugars on the lower side.  Oral meds will remain on hold on discharge.  Sliding scale insulin can be used in CIR on discharge.  Discharge Instructions  Discharge Instructions    Ambulatory referral to Cardiology   Complete by:  As directed    NSTEMI  Ambulatory referral to Gastroenterology   Complete by:  As directed    GI bleed followup   Call MD for:  difficulty breathing, headache or visual disturbances   Complete by:  As directed    Call MD for:  extreme  fatigue   Complete by:  As directed    Call MD for:  hives   Complete by:  As directed    Call MD for:  persistant dizziness or light-headedness   Complete by:  As directed    Call MD for:  persistant nausea and vomiting   Complete by:  As directed    Call MD for:  severe uncontrolled pain   Complete by:  As directed    Call MD for:  temperature >100.4   Complete by:  As directed    Diet - low sodium heart healthy   Complete by:  As directed    Diet Carb Modified   Complete by:  As directed    Increase activity slowly   Complete by:  As directed      Allergies as of 10/02/2018   No Known Allergies     Medication List    STOP taking these medications   atenolol 100 MG tablet Commonly known as:  TENORMIN   glimepiride 2 MG tablet Commonly known as:  AMARYL   lisinopril 20 MG tablet Commonly known as:  PRINIVIL,ZESTRIL   pioglitazone 30 MG tablet Commonly known as:  ACTOS     TAKE these medications   acetaminophen 500 MG tablet Commonly known as:  TYLENOL Take 500 mg by mouth every 6 (six) hours as needed for headache (pain).   amiodarone 200 MG tablet Commonly known as:  PACERONE Take 1 tablet (200 mg total) by mouth 2 (two) times daily.   calcium elemental as carbonate 400 MG chewable tablet Commonly known as:  BARIATRIC TUMS ULTRA Chew 1,000 mg by mouth 3 (three) times daily as needed for heartburn (indigestion).   furosemide 20 MG tablet Commonly known as:  LASIX Take 20 mg by mouth 2 (two) times daily.   latanoprost 0.005 % ophthalmic solution Commonly known as:  XALATAN Place 1 drop into both eyes at bedtime.   montelukast 10 MG tablet Commonly known as:  SINGULAIR Take 10 mg by mouth at bedtime as needed (allergies).   multivitamin with minerals Tabs tablet Take 1 tablet by mouth daily. Centrum Silver   pantoprazole 40 MG tablet Commonly known as:  PROTONIX Take 1 tablet (40 mg total) by mouth 2 (two) times daily before a meal.   potassium  chloride 10 MEQ tablet Commonly known as:  K-DUR Take 10 mEq by mouth daily.   pravastatin 20 MG tablet Commonly known as:  PRAVACHOL Take 20 mg by mouth daily with supper.   terazosin 5 MG capsule Commonly known as:  HYTRIN Take 5 mg by mouth daily with supper.   Vitamin D (Ergocalciferol) 1.25 MG (50000 UT) Caps capsule Commonly known as:  DRISDOL Take 50,000 Units by mouth every Wednesday.      Follow-up Information    Revankar, Reita Cliche, MD Follow up.   Specialty:  Cardiology Why:  [facility] (Cardiology follow-up) - [place] office - 10/15/18 at 9:40am. Contact information: [place] [address] [phone]          No Known Allergies  Consultations:  GI/cardiology   Procedures/Studies: Dg Chest 2 View  Result Date: 09/27/2018 CLINICAL DATA:  Toxic encephalopathy, pneumonia, hypertension, type II diabetes mellitus EXAM:  CHEST - 2 VIEW COMPARISON:  09/24/2018 FINDINGS: Upper normal heart size. Atherosclerotic calcification aorta. Mediastinal contours and pulmonary vascularity normal. Questionable RIGHT basilar infiltrate. Remaining lungs clear. Small pleural effusions blunt the posterior costophrenic angles. No pneumothorax. Endplate spur formation thoracic spine. IMPRESSION: Small BILATERAL pleural effusions and questionable RIGHT lower lobe infiltrate. Electronically Signed   By: Lavonia Dana M.D.   On: 09/27/2018 18:46   US Renal  Result Date: 09/26/2018 CLINICAL DATA:  Acute renal failure. EXAM: RENAL / URINARY TRACT ULTRASOUND COMPLETE COMPARISON:  Renal ultrasound, 09/22/2018 FINDINGS: Right Kidney: Renal measurements: 9.9 x 5.3 x 6.0 cm = volume: 164 mL. Mildly increased parenchymal echogenicity. Diffuse renal cortical thinning. No masses, stones or hydronephrosis. Left Kidney: Renal measurements: 10.2 x 5.9 x 6.2 cm = volume: 195 mL. Borderline increased parenchymal echogenicity. Diffuse renal cortical thinning. No masses, stones or  hydronephrosis. Bladder: Decompressed with a Foley catheter. IMPRESSION: 1. No acute findings.  No hydronephrosis. 2. Bilateral renal cortical thinning. Increased right and borderline increased left renal parenchymal echogenicity. Findings consistent with medical renal disease. Electronically Signed   By: Lajean Manes M.D.   On: 09/26/2018 16:28    EGD  Impression: - Widely patent Schatzki ring. - Large hiatal hernia. - Normal stomach. - One non-bleeding duodenal ulcer with a visible  vessel. Injected. Treated with bipolar cautery. - No specimens collected.  Recommendation:  - Return patient to hospital ward for ongoing care. - Full liquid diet. - Continue present medications, including  pantoprazole drip. - H 8 hour Hgb and Hct x 3 , then further plan to  follow. H pylori serology No aspirin, antiplatelet agents or anticoagulants.  TTE -  1. The left ventricle has normal systolic function of 54-27%. The cavity size was normal. There is mildly increased left ventricular wall thickness. Echo evidence of impaired diastolic relaxation. 2. The right ventricle has normal systolic function. The cavity was normal. There is no increase in right ventricular wall thickness. 3. Left atrial size was mildly dilated. 4. The mitral valve is normal in structure. 5. The tricuspid valve is normal in structure. 6. The aortic valve is normal in structure. There is mild thickening and mild calcification of the aortic valve. 7. The pulmonic valve was normal in structure. 8. The aortic root and ascending aorta are normal in size and  structure.   Subjective: Patient seen and examined at bedside.  He is awake, poor historian.  No overnight fever, nausea, vomiting, worsening shortness of breath.  Spoke with granddaughter at bedside  Discharge Exam:  Vitals:   10/01/18 0531 10/01/18 1458 10/01/18 2023 10/02/18 0447  BP: 96/69 116/62 (!) 107/58 119/60  Pulse: 67 80 69 69  Resp:  18 16 16   Temp:  98.1 F (36.7 C) (!) 97.5 F (36.4 C) 97.8 F (36.6 C)  TempSrc:  Oral Oral Oral  SpO2:  97% 98% 99%  Weight:      Height:        General exam: Appears calm and comfortable.  No acute distress.  Poor historian respiratory system: Bilateral decreased breath sounds at bases, scattered crackles, no wheezing Cardiovascular system: S1 & S2 heard, Rate controlled Gastrointestinal system: Abdomen is nondistended, soft and nontender. Normal bowel sounds heard. Extremities: No cyanosis; trace edema   The results of significant diagnostics from this hospitalization (including imaging, microbiology, ancillary and laboratory) are listed below for reference.     Microbiology: No results found for this or any previous visit (from the past 240 hour(s)).  Labs: BNP (last 3 results) No results for input(s): BNP in the last 8760 hours. Basic Metabolic Panel: Recent Labs  Lab 09/25/18 2236 09/26/18 0522 09/27/18 0234 09/28/18 0355 10/02/18 0449  NA 142 142 146* 145 140  K 4.2 4.7 4.3 3.8 4.2  CL 111 112* 115* 114* 104  CO2 24 22 24 23 28   GLUCOSE 247* 244* 199* 136* 123*  BUN 56* 63* 56* 40* 15  CREATININE 1.86* 2.04* 1.67* 1.52* 1.64*  CALCIUM 7.5* 7.6* 7.7* 8.0* 7.8*  MG  --  1.8 1.9 2.0 1.9   Liver Function Tests: Recent Labs  Lab 09/25/18 2236 09/27/18 0234 09/28/18 0355  AST 56* 26 25  ALT 25 21 20   ALKPHOS 35* 30* 33*  BILITOT 0.4 0.5 0.5  PROT 4.5* 4.1* 4.3*  ALBUMIN 1.9* 1.8* 2.0*   No results for input(s): LIPASE, AMYLASE in the last 168 hours. No results for input(s): AMMONIA in the last 168  hours. CBC: Recent Labs  Lab 09/28/18 0355 09/29/18 0354 09/29/18 1101 09/29/18 1903 09/30/18 0335 10/01/18 1129 10/02/18 0449  WBC 20.9* 17.9*  --   --  14.6* 14.5* 12.7*  NEUTROABS  --   --   --   --   --   --  9.9*  HGB 8.9* 8.6* 9.5* 8.9* 8.2* 8.4* 8.6*  HCT 27.9* 27.8* 29.2* 27.4* 25.3* 26.9* 27.0*  MCV 91.2 92.7  --   --  94.1 93.7 93.1  PLT 170 196  --   --  211 244 261   Cardiac Enzymes: Recent Labs  Lab 09/25/18 2235 09/26/18 0522  TROPONINI 15.74* 10.23*   BNP: Invalid input(s): POCBNP CBG: Recent Labs  Lab 10/01/18 2019 10/02/18 0008 10/02/18 0443 10/02/18 0842 10/02/18 1248  GLUCAP 127* 109* 110* 101* 133*   D-Dimer No results for input(s): DDIMER in the last 72 hours. Hgb A1c No results for input(s): HGBA1C in the last 72 hours. Lipid Profile No results for input(s): CHOL, HDL, LDLCALC, TRIG, CHOLHDL, LDLDIRECT in the last 72 hours. Thyroid function studies No results for input(s): TSH, T4TOTAL, T3FREE, THYROIDAB in the last 72 hours.  Invalid input(s): FREET3 Anemia work up No results for input(s): VITAMINB12, FOLATE, FERRITIN, TIBC, IRON, RETICCTPCT in the last 72 hours. Urinalysis    Component Value Date/Time   COLORURINE STRAW (A) 09/26/2018 1837   APPEARANCEUR CLEAR 09/26/2018 1837   LABSPEC 1.015 09/26/2018 1837   PHURINE 5.0 09/26/2018 1837   GLUCOSEU NEGATIVE 09/26/2018 1837   HGBUR SMALL (A) 09/26/2018 1837   BILIRUBINUR NEGATIVE 09/26/2018 1837   KETONESUR NEGATIVE 09/26/2018 1837   PROTEINUR NEGATIVE 09/26/2018 1837   NITRITE NEGATIVE 09/26/2018 1837   LEUKOCYTESUR NEGATIVE 09/26/2018 1837   Sepsis Labs Invalid input(s): PROCALCITONIN,  WBC,  LACTICIDVEN Microbiology No results found for this or any previous visit (from the past 240 hour(s)).   Time coordinating discharge: 35 minutes  SIGNED:   Aline August, MD  [facility] 10/02/2018, 12:54 PM Pager: [phone]  If 7PM-7AM, please contact  night-coverage [email or web address] Password [email or web address]

## 2018-10-02 NOTE — Progress Notes (Signed)
Patient transported to CIR in stable condition. Belongings at bedside.

## 2018-10-03 ENCOUNTER — Inpatient Hospital Stay (HOSPITAL_COMMUNITY): Payer: Medicare Other

## 2018-10-03 ENCOUNTER — Inpatient Hospital Stay (HOSPITAL_COMMUNITY): Payer: Medicare Other | Admitting: Occupational Therapy

## 2018-10-03 DIAGNOSIS — R5381 Other malaise: Principal | ICD-10-CM

## 2018-10-03 DIAGNOSIS — I5032 Chronic diastolic (congestive) heart failure: Secondary | ICD-10-CM

## 2018-10-03 DIAGNOSIS — E669 Obesity, unspecified: Secondary | ICD-10-CM

## 2018-10-03 DIAGNOSIS — E46 Unspecified protein-calorie malnutrition: Secondary | ICD-10-CM

## 2018-10-03 DIAGNOSIS — J189 Pneumonia, unspecified organism: Secondary | ICD-10-CM

## 2018-10-03 DIAGNOSIS — N183 Chronic kidney disease, stage 3 (moderate): Secondary | ICD-10-CM

## 2018-10-03 DIAGNOSIS — D72829 Elevated white blood cell count, unspecified: Secondary | ICD-10-CM

## 2018-10-03 DIAGNOSIS — E1169 Type 2 diabetes mellitus with other specified complication: Secondary | ICD-10-CM

## 2018-10-03 DIAGNOSIS — Z8719 Personal history of other diseases of the digestive system: Secondary | ICD-10-CM

## 2018-10-03 DIAGNOSIS — N184 Chronic kidney disease, stage 4 (severe): Secondary | ICD-10-CM

## 2018-10-03 LAB — GLUCOSE, CAPILLARY
GLUCOSE-CAPILLARY: 128 mg/dL — AB (ref 70–99)
Glucose-Capillary: 124 mg/dL — ABNORMAL HIGH (ref 70–99)
Glucose-Capillary: 111 mg/dL — ABNORMAL HIGH (ref 70–99)
Glucose-Capillary: 143 mg/dL — ABNORMAL HIGH (ref 70–99)

## 2018-10-03 LAB — CBC WITH DIFFERENTIAL/PLATELET
Abs Immature Granulocytes: 0 10*3/uL (ref 0.00–0.07)
Basophils Absolute: 0 10*3/uL (ref 0.0–0.1)
Basophils Relative: 0 %
Eosinophils Absolute: 0 10*3/uL (ref 0.0–0.5)
Eosinophils Relative: 0 %
HCT: 29.8 % — ABNORMAL LOW (ref 39.0–52.0)
Hemoglobin: 9.3 g/dL — ABNORMAL LOW (ref 13.0–17.0)
Lymphocytes Relative: 9 %
Lymphs Abs: 1 10*3/uL (ref 0.7–4.0)
MCH: 29 pg (ref 26.0–34.0)
MCHC: 31.2 g/dL (ref 30.0–36.0)
MCV: 92.8 fL (ref 80.0–100.0)
MONO ABS: 0.9 10*3/uL (ref 0.1–1.0)
Monocytes Relative: 8 %
Neutro Abs: 9.4 10*3/uL — ABNORMAL HIGH (ref 1.7–7.7)
Neutrophils Relative %: 83 %
Platelets: 274 10*3/uL (ref 150–400)
RBC: 3.21 MIL/uL — ABNORMAL LOW (ref 4.22–5.81)
RDW: 14.8 % (ref 11.5–15.5)
WBC: 11.3 10*3/uL — ABNORMAL HIGH (ref 4.0–10.5)
nRBC: 0 /100 WBC
nRBC: 0.2 % (ref 0.0–0.2)

## 2018-10-03 LAB — COMPREHENSIVE METABOLIC PANEL
ALK PHOS: 44 U/L (ref 38–126)
ALT: 22 U/L (ref 0–44)
AST: 19 U/L (ref 15–41)
Albumin: 2.5 g/dL — ABNORMAL LOW (ref 3.5–5.0)
Anion gap: 12 (ref 5–15)
BUN: 18 mg/dL (ref 8–23)
CALCIUM: 8.4 mg/dL — AB (ref 8.9–10.3)
CO2: 27 mmol/L (ref 22–32)
Chloride: 99 mmol/L (ref 98–111)
Creatinine, Ser: 1.63 mg/dL — ABNORMAL HIGH (ref 0.61–1.24)
GFR calc Af Amer: 46 mL/min — ABNORMAL LOW (ref >60)
GFR calc non Af Amer: 39 mL/min — ABNORMAL LOW (ref >60)
Glucose, Bld: 133 mg/dL — ABNORMAL HIGH (ref 70–99)
Potassium: 3.7 mmol/L (ref 3.5–5.1)
Sodium: 138 mmol/L (ref 135–145)
TOTAL PROTEIN: 5.5 g/dL — AB (ref 6.5–8.1)
Total Bilirubin: 0.6 mg/dL (ref 0.3–1.2)

## 2018-10-03 LAB — HEMOGLOBIN A1C
Hgb A1c MFr Bld: 6.2 % — ABNORMAL HIGH (ref 4.8–5.6)
Mean Plasma Glucose: 131.24 mg/dL

## 2018-10-03 NOTE — Progress Notes (Signed)
Occupational Therapy Session Note  Patient Details  Name: SAVA PROBY MRN: [id number] Date of Birth: 29-Aug-1940  Today's Date: 10/03/2018 OT Individual Time: 1130-1200 OT Individual Time Calculation (min): 30 min    Short Term Goals: Week 1:     Skilled Therapeutic Interventions/Progress Updates:   OT intervention with focus on sit<>stand, standing balance, functional amb with RW, toilet tranfsers and toileting, DME needs, activity tolerance, and safety awareness to increase independence with BADLS. Pt performed sit<>stand from recliner with mod A and amb with RW in room to bathroom (CGA).  Pt performed toilet transfer to [facility] over tolet with min A and performed clothing management with min A. Recommended BSC for use at home.  Pt agreeable to recommendation.  Pt returned to recliner and remained in recliner with family member present.  All needs within reach and lunch tray placed in in front of pt..  Therapy Documentation Precautions:  Precautions Precautions: Fall Restrictions Weight Bearing Restrictions: No Pain: Pain Assessment Pain Score: 0-No pain   Therapy/Group: Individual Therapy  Leroy Libman 10/03/2018, 12:01 PM

## 2018-10-03 NOTE — Progress Notes (Signed)
Occupational Therapy Session Note  Patient Details  Name: Erik Morales MRN: [id number] Date of Birth: 1941/02/03  Today's Date: 10/03/2018 OT Individual Time: 1340-1434 OT Individual Time Calculation (min): 54 min    Skilled Therapeutic Interventions/Progress Updates:    Pt seen for OT session focusing on functional standing balance/endurance and general activity tolerance. Pt sitting up in recliner upon arrival, denied pain and agreeable to tx sesiosn.  He completed sit>stand throughout session with min A-CGA using RW with VCs for hand placement. He ambulated short distances with CGA, VCs to widen BOS.  He self propelled w/c to therapy gym with supervision and VCs for effective  In therapy gym, completed standing table top activity, matching and fastening nuts and bolts. Pt tolerated standing ~5 minutes with CGA fading to supervision before requiring seated rest break. Second trial completed with pt standing on foam wedged mat to challenege and strengthen balance strategies. Pt tolerating ~2 minutes standing on mat while removing nuts/bolts before requiring seated rest break. Pt then voiced LE fatigue and declining any furhter standing tasks.  Completed UE SCI fit arm cycle. Pt alternating directions and taking intermittent rest breaks throughout 5 minute cycle. Pt returned to room at end of session, NT requesting pt return to bed in prep for off unit procedure. Pt left in supine with all needs in reach and bed alarm on.   Therapy Documentation Precautions:  Restrictions Weight Bearing Restrictions: No Pain:    No/denies pain   Therapy/Group: Individual Therapy  [name] 10/03/2018, 7:15 AM

## 2018-10-03 NOTE — Progress Notes (Signed)
[place] PHYSICAL MEDICINE & REHABILITATION PROGRESS NOTE  Subjective/Complaints: Patient seen sitting up in bed eating breakfast this morning.  He states he did not sleep well overnight due to all of the noise outside of his room.  He states he wants to go home.  ROS: Denies CP, shortness of breath, nausea, vomiting, diarrhea.  Objective: Vital Signs: Blood pressure 93/65, pulse 97, temperature 98.1 F (36.7 C), temperature source Oral, resp. rate 14, height 5\' 9"  (1.753 m), weight 102.1 kg, SpO2 97 %. No results found. Recent Labs    10/02/18 0449 10/03/18 0511  WBC 12.7* 11.3*  HGB 8.6* 9.3*  HCT 27.0* 29.8*  PLT 261 274   Recent Labs    10/02/18 0449 10/03/18 0511  NA 140 138  K 4.2 3.7  CL 104 99  CO2 28 27  GLUCOSE 123* 133*  BUN 15 18  CREATININE 1.64* 1.63*  CALCIUM 7.8* 8.4*    Physical Exam: BP 93/65 (BP Location: Left Arm)   Pulse 97   Temp 98.1 F (36.7 C) (Oral)   Resp 14   Ht 5\' 9"  (1.753 m)   Wt 102.1 kg   SpO2 97%   BMI 33.24 kg/m  Constitutional: No distress . Vital signs reviewed. HENT: Normocephalic.  Atraumatic. Eyes: EOMI. No discharge. Cardiovascular: RRR. No JVD. Respiratory: CTA Bilaterally. Normal effort. GI: BS +. Non-distended. Musc: Bilateral lower extremity edema Neurological: He is alert and oriented x3 Motor: Grossly 4+/5 throughout Skin: Warm and dry.  Intact. Psych: Normal behavior.  Normal affect.  Assessment/Plan: 1. Functional deficits secondary to debility which require 3+ hours per day of interdisciplinary therapy in a comprehensive inpatient rehab setting.  Physiatrist is providing close team supervision and 24 hour management of active medical problems listed below.  Physiatrist and rehab team continue to assess barriers to discharge/monitor patient progress toward functional and medical goals  Care Tool:  Bathing              Bathing assist       Upper Body Dressing/Undressing Upper body dressing         Upper body assist      Lower Body Dressing/Undressing Lower body dressing            Lower body assist       Toileting Toileting    Toileting assist Assist for toileting: Moderate Assistance - Patient 50 - 74%     Transfers Chair/bed transfer  Transfers assist           Locomotion Ambulation   Ambulation assist              Walk 10 feet activity   Assist           Walk 50 feet activity   Assist           Walk 150 feet activity   Assist           Walk 10 feet on uneven surface  activity   Assist           Wheelchair     Assist               Wheelchair 50 feet with 2 turns activity    Assist            Wheelchair 150 feet activity     Assist            Medical Problem List and Plan: 1.  Debility  secondary to PNA, NSTEMI, GI  bleed requiring transfusion  Begin CIR  Notes reviewed, imaging ordered, labs reviewed 2.  DVT Prophylaxis/Anticoagulation: Mechanical: Sequential compression devices, below knee Bilateral lower extremities 3. Pain Management: tylenol prn  4. Mood: LCSW to follow for evaluation and support.  5. Neuropsych: This patient is capable of making decisions on his own behalf. 6. MASD/Skin/Wound Care: Routine pressure relief measures.  7. Fluids/Electrolytes/Nutrition: Monitor I/Os.  8. E coli Urosepsis/ CAP: Completed 7 day course of antibiotic regimen on 2/13.   CXR pending.    Incentive spiro ordered 9. GIB with melena: Continue to monitor for signs of bleeding and serial H/H. Continue Protonix BID for 4 wks then daily.   Hemoglobin 9.3 on 2/14  Continue to monitor 10. A fib/A flutter: Monitor HR bid. Continue amiodarone bid.  11. Chronic diastolic CHF: Lasix resumed 2/11. Monitor weights daily and for other signs of overload.    Filed Weights   10/02/18 1739  Weight: 102.1 kg  12. T2DM: Monitor BS ac/hs. Continue SSI for now. Resume Amaryl as intake improves.    Monitor with increased mobility 13. Leucocytosis:   Monitor for signs of infection.   WBCs 11.3 on 2/14  Afebrile  Continue to monitor 14.  Hypoalbuminemia  Supplement initiated on 2/14 15.  CKD  Creatinine 1.63 on 2/14  Cont to monitor   LOS: 1 days A FACE TO FACE EVALUATION WAS PERFORMED  Ankit Lorie Phenix 10/03/2018, 9:18 AM

## 2018-10-03 NOTE — Plan of Care (Signed)
  Problem: Consults Goal: RH GENERAL PATIENT EDUCATION Description See Patient Education module for education specifics. Outcome: Progressing   Problem: RH BOWEL ELIMINATION Goal: RH STG MANAGE BOWEL WITH ASSISTANCE Description STG Manage Bowel with Mod Assistance.  Outcome: Progressing   Problem: RH BLADDER ELIMINATION Goal: RH STG MANAGE BLADDER WITH ASSISTANCE Description STG Manage Bladder With Mod Assistance  Outcome: Progressing   Problem: RH SKIN INTEGRITY Goal: RH STG SKIN FREE OF INFECTION/BREAKDOWN Description Pt will remain free of breakdown with min assist    Outcome: Progressing   Problem: RH SAFETY Goal: RH STG ADHERE TO SAFETY PRECAUTIONS W/ASSISTANCE/DEVICE Description STG Adhere to Safety Precautions With Min Assistance/Device.  Outcome: Progressing   Problem: RH PAIN MANAGEMENT Goal: RH STG PAIN MANAGED AT OR BELOW PT'S PAIN GOAL Description <3 out of 10.   Outcome: Progressing

## 2018-10-03 NOTE — Progress Notes (Signed)
Per nursing, patient was given "Data Collection Information Summary for Patients in Inpatient Rehabilitation Facilities with attached Privacy Act Statement Health Care Records" upon admission.    Patient information reviewed and entered into e[facility]hab System by Becky Bernardo Brayman, PPS coordinator. Information including medical coding, function ability, and quality indicators will be reviewed and updated through discharge.   

## 2018-10-03 NOTE — Progress Notes (Signed)
Social Work  Social Work Assessment and Plan  Patient Details  Name: Erik Morales MRN: [id number] Date of Birth: 08-Jun-1941  Today's Date: 10/03/2018  Problem List:  Patient Active Problem List   Diagnosis Date Noted  . Chronic kidney disease (CKD), stage III (moderate) (HCC)   . Hypoalbuminemia due to protein-calorie malnutrition ([place])   . Diabetes mellitus type 2 in obese ([place])   . History of GI bleed   . Chronic diastolic congestive heart failure ([place])   . Debility 10/02/2018  . Tobacco abuse   . Leukocytosis   . Duodenal ulcer with hemorrhage   . Melena   . Acute blood loss anemia   . Pressure injury of skin 09/26/2018  . Hematochezia 09/25/2018  . AKI (acute kidney injury) ([place]) 09/25/2018  . Severe sepsis ([place]) 09/25/2018  . Atrial fibrillation with RVR ([place]) 09/25/2018  . Elevated troponin 09/25/2018  . Abnormal thyroid function test 09/25/2018  . DM2 (diabetes mellitus, type 2) ([place]) 09/25/2018  . HTN (hypertension) 09/25/2018  . E-coli UTI 09/25/2018  . CAP (community acquired pneumonia) 09/25/2018   Past Medical History:  Past Medical History:  Diagnosis Date  . DM2 (diabetes mellitus, type 2) ([place])   . HLD (hyperlipidemia)   . HTN (hypertension)    Past Surgical History:  Past Surgical History:  Procedure Laterality Date  . ESOPHAGOGASTRODUODENOSCOPY (EGD) WITH PROPOFOL N/A 09/27/2018   Procedure: ESOPHAGOGASTRODUODENOSCOPY (EGD) WITH PROPOFOL;  Surgeon: Doran Stabler, MD;  Location: [facility];  Service: Gastroenterology;  Laterality: N/A;  . HOT HEMOSTASIS N/A 09/27/2018   Procedure: HOT HEMOSTASIS (ARGON PLASMA COAGULATION/BICAP);  Surgeon: Doran Stabler, MD;  Location: [facility];  Service: Gastroenterology;  Laterality: N/A;  . SUBMUCOSAL INJECTION  09/27/2018   Procedure: SUBMUCOSAL INJECTION;  Surgeon: Doran Stabler, MD;  Location: [facility];  Service: Gastroenterology;;   Social History:  reports that he has quit smoking. He does  not have any smokeless tobacco history on file. He reports that he does not drink alcohol or use drugs.  Family / Support Systems Marital Status: Married Patient Roles: Spouse, Other (Comment)(grandfather) Spouse/Significant Other: [name] [phone]  [phone] Other Supports: Sarah-granddaughter [phone]  Jessica-granddaughter [phone] Anticipated Caregiver: Four grandchildre but non are local to pt and his wife.  Ability/Limitations of Caregiver: Wife has dementia and needs supervision level. Grandchildren all work and do not live close to them. Wife currently staying with her brother while pt is hospitalized Caregiver Availability: Other (Comment)(Will need to come up with a plan) Family Dynamics: Close with their four grandchildren who are involved and will assist but all have small children and jobs. They do have wife's brother who is assisting and friends who are involved.  Social History Preferred language: English Religion:  Cultural Background: No issues Education: Charity fundraiser Read: Yes Write: Yes Employment Status: Retired Public relations account executive Issues: No issues Guardian/Conservator: None-according to MD pt is capable of making his own decisions while here. Granddaughter reports they have tried to be here with him for support   Abuse/Neglect Abuse/Neglect Assessment Can Be Completed: Yes Physical Abuse: Denies Verbal Abuse: Denies Sexual Abuse: Denies Exploitation of patient/patient's resources: Denies Self-Neglect: Denies  Emotional Status Pt's affect, behavior and adjustment status: Pt voiced he has always been independent and taken care of his wife. 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 He wants to be back home with his wife who can do for herself but needs supervision.  Recent Psychosocial Issues: other health issues was going to the MD regularly Psychiatric History: No history deferred depression screening due  to adjusting to the new unit and just starting to feel better physically. Will ask team if would benefit from seeing neuro-psych while here. Substance Abuse History: Tobacco aware of the health risks and plans to try to quit now. He is aware of the resources out there for him if he wants it  Patient / Family Perceptions, Expectations & Goals Pt/Family understanding of illness & functional limitations: Pt and granddaughter can explain his treatments, but really do not have a true medical diaganosis of what he came in with. Aware of sepsis, but told pneumonia and possible heart attack. Asked PA and nurse to talk with both and provide information to them. Hopefully this will provide clarity of his medical issues. Premorbid pt/family roles/activities: Husband, father, grandfather, retiree, church member, Industrial/product designer, Social research officer, government Anticipated changes in roles/activities/participation: resume Pt/family expectations/goals: Pt states: " I want to get back home and be independent and back to what I was doing."  Granddaughter states: " I hope he can get stronger and mobile lie he was."  [employer]: None Premorbid Home Care/DME Agencies: None Transportation available at discharge: Family pt was driving prior to admission Resource referrals recommended: Support group (specify)  Discharge Planning Living Arrangements: Spouse/significant other Support Systems: Spouse/significant other, Other relatives, Water engineer, Social worker community Type of Residence: Private residence Insurance Resources: Multimedia programmer (specify)([employer]-Medicare) Financial Resources: Radio broadcast assistant Screen Referred: No Living Expenses: Own Money Management: Patient Does the patient have any problems obtaining your medications?: No Home Management: Both he and wife Patient/Family Preliminary Plans: Return home with his wife who does take care of herself, but need reminding aobut medications and  someone there for safety. Pt wants to get back home with her. Wife currently is staying with her brother while pt is here. Will await therapy team evaluations and work on discharge needs. Sw Barriers to Discharge: Decreased caregiver support Sw Barriers to Discharge Comments: Does not have 24 hr care at discharge, he was the caregiver for his wife who has dementia Social Work Anticipated Follow Up Needs: HH/OP, Support Group  Clinical Impression Pleasant gentleman who is motivated to recover and feels his main issue is his strength and regaining this. He is willing to work hard and always has thorughtout his life. His grandchildren are involved and will do what they can but live one hour away and have jobs with families. Will await therapy evaluations and work on a safe discharge plan for him and wife.  Elease Hashimoto 10/03/2018, 10:57 AM

## 2018-10-03 NOTE — IPOC Note (Signed)
Overall Plan of Care (IPOC) Patient Details Name: Erik Morales MRN: [id number] DOB: Nov 20, 1940  Admitting Diagnosis: Debility  Hospital Problems: Active Problems:   Debility   Chronic kidney disease (CKD), stage III (moderate) (HCC)   Hypoalbuminemia due to protein-calorie malnutrition ([place])   Diabetes mellitus type 2 in obese ([place])   History of GI bleed   Chronic diastolic congestive heart failure ([place])     Functional Problem List: Nursing Motor, Medication Management, Endurance, Safety, Pain, Edema, Bladder, Bowel, Nutrition  PT Balance, Safety, Endurance  OT Balance, Edema, Endurance, Cognition, Pain, Perception, Safety  SLP    TR         Basic ADL's: OT Eating, Grooming, Bathing, Dressing, Toileting     Advanced  ADL's: OT Simple Meal Preparation, Laundry     Transfers: PT Bed Mobility, Bed to Chair, Musician, Manufacturing systems engineer, Metallurgist: PT Ambulation, Emergency planning/management officer, Stairs     Additional Impairments: OT None  SLP        TR      Anticipated Outcomes Item Anticipated Outcome  Self Feeding MOD I  Swallowing      Basic self-care  MOD I  Toileting  MOD I    Bathroom Transfers MOD I  Bowel/Bladder  Pt will manage bowel and bladder with min assist   Transfers  mod I  Locomotion  mod I  Communication     Cognition     Pain  Pt will manage pain at 4 or less on a scale of 0-10.   Safety/Judgment      Therapy Plan: PT Intensity: Minimum of 1-2 x/day ,45 to 90 minutes PT Frequency: 5 out of 7 days PT Duration Estimated Length of Stay: 10 days OT Intensity: Minimum of 1-2 x/day, 45 to 90 minutes OT Frequency: 5 out of 7 days OT Duration/Estimated Length of Stay: 1.5 weeks      Team Interventions: Nursing Interventions Patient/Family Education, Bowel Management, Pain Management, Disease Management/Prevention, Bladder Management, Discharge Planning, Cognitive Remediation/Compensation, Dysphagia/Aspiration Precaution Training   PT interventions Ambulation/gait training, Stair training, Training and development officer, DME/adaptive equipment instruction, Patient/family education, Therapeutic Activities, Wheelchair propulsion/positioning, Therapeutic Exercise, UE/LE Coordination activities, Neuromuscular re-education, Functional mobility training, UE/LE Strength taining/ROM  OT Interventions Training and development officer, Community reintegration, Disease mangement/prevention, Neuromuscular re-education, Patient/family education, Self Care/advanced ADL retraining, Splinting/orthotics, Therapeutic Exercise, UE/LE Coordination activities, Cognitive remediation/compensation, Discharge planning, DME/adaptive equipment instruction, Functional mobility training, Pain management, Psychosocial support, Skin care/wound managment, Therapeutic Activities, UE/LE Strength taining/ROM  SLP Interventions    TR Interventions    SW/CM Interventions Discharge Planning, Psychosocial Support, Patient/Family Education   Barriers to Discharge MD  Medical stability and Weight  Nursing      PT Decreased caregiver support    OT Inaccessible home environment, Decreased caregiver support, Lack of/limited family support unsure if family will be able to provide supervision as needed  SLP      SW Decreased caregiver support Does not have 24 hr care at discharge, he was the caregiver for his wife who has dementia   Team Discharge Planning: Destination: PT-  ,OT- Home , SLP-  Projected Follow-up: PT-Home health PT, OT-  Home health OT, SLP-  Projected Equipment Needs: PT-3 in 1 bedside comode, Rolling walker with 5" wheels, OT-  , SLP-  Equipment Details: PT- , OT-  Patient/family involved in discharge planning: PT- Patient,  OT-Family member/caregiver, Patient, SLP-   MD ELOS: 8-11 days. Medical Rehab Prognosis:  Excellent Assessment: 78 year old  male with history of T2DM, HTN, tobacco abuse who was admitted on 09/25/18 after being found in the  ditch by wife with mild cognitive deficits--unknown period of time down.  He was noted to have elevated troponin at 15.7, acute renal failure, leukocytosis with WBC 24,800 and hemoglobin 8.2.  He was started on fluids due to lactic acidosis and treated for urosepsis due to E. coli UTI.  Chest x-ray did show question of pneumonia therefore antibiotic coverage broadened.  Cardiology felt elevated troponin due to demand ischemia but he did develop PAF/flutter treated with Cardizem and amiodarone for rate control with addition of  Eliquis for anticoagulation.  He developed GI bleed with bloody stool drop in hemoglobin to 5.7 as well as hypotension with confusion.  He was transferred to [facility] for higher level of care.  He was treated with multiple units packed red blood cells and GI consulted for input. Dr Pincus Sanes recommended direct reversal of oral anticoagulation as well as Protonix drip and every 6 hours CBC for monitoring. Cardiology/Dr. Margaretann Loveless consulted for input and felt that recurrent rise in troponin's due to NSTEMI and patient was not a candidate for invasive ischemic work-up--- to follow-up with cardiology in a month.  He underwent EGD on 2/8 revealing widely patent Schatzki's ring at the GE junction, large hiatal hernia, 1 nonbleeding cratered duodenal ulcer with visible vessel that was injected with epinephrine.  He recommended full liquid diet and check a hematocrit x3 as well as avoiding aspirin antiplatelet agents or anticoagulation for now.  Melena resolving and he was advanced to regular diet on 2/9 with recommendations to continue PPI bid for 4 weeks then daily and to "avoid all NSAIDs moving forward". Serial H/H stable and patient cleared to start anticoagulation on 2/25.  Patient with QT prolongation and pharmacy recommends following closely for interaction. He completed antibiotics on 2/13. Therapy ongoing and patient limited by BLE weakness and debility.  Patient with resulting  functional deficits with mobility, endurance, self-care.  We will set goals for Mod I with PT/OT.  See Team Conference Notes for weekly updates to the plan of care

## 2018-10-03 NOTE — H&P (Signed)
Copied from old chart:     Physical Medicine and Rehabilitation Admission H&P    CC: Functional decline    HPI: Erik Morales is a 78 year old male with history of T2DM, HTN, tobacco abuse who was admitted on 09/25/18 after being found in the ditch by wife with mild cognitive deficits--unknown period of time down.  He was noted to have elevated troponin at 15.7, acute renal failure, leukocytosis with WBC 24,800 and hemoglobin 8.2.  He was started on fluids due to lactic acidosis and treated for urosepsis due to E. coli UTI.  Chest x-ray did show question of pneumonia therefore antibiotic coverage broadened.  Cardiology felt elevated troponin due to demand ischemia but he did develop PAF/flutter treated with Cardizem and amiodarone for rate control with addition of  Eliquis for anticoagulation.  He developed GI bleed with bloody stool drop in hemoglobin to 5.7 as well as hypotension with confusion.  He was transferred to [facility] for higher level of care.  He was treated with multiple units packed red blood cells and GI consulted for input. Dr Pincus Sanes recommended direct reversal of oral anticoagulation as well as Protonix drip and every 6 hours CBC for monitoring.  Cardiology/Dr. Margaretann Loveless consulted for input and felt that recurrent rise in troponin's due to NSTEMI and patient was not a candidate for invasive ischemic work-up--- to follow-up with cardiology in a month.  He underwent EGD on 2/8 revealing widely patent Schatzki's ring at the GE junction, large hiatal hernia, 1 nonbleeding cratered duodenal ulcer with visible vessel that was injected with epinephrine.  He recommended full liquid diet and check a hematocrit x3 as well as avoiding aspirin antiplatelet agents or anticoagulation for now.  Melena resolving and he was advanced to regular diet on 2/9 with recommendations to continue PPI bid for 4 weeks then daily and to "avoid all NSAIDs moving forward". Serial H/H stable and patient  cleared to start anticoagulation ion 2/25.  Patient with QT prolongation and pharmacy recommends following closely for interaction. He completes antibiotic regimen today. Therapy ongoing and patient limited by BLE weakness and debility. CIR recommended for follow up therapy.   Review of Systems  Constitutional: Negative for chills and fever.  HENT: Negative for hearing loss and tinnitus.   Eyes: Negative for blurred vision and double vision.  Respiratory: Positive for cough and sputum production.   Cardiovascular: Negative for chest pain and palpitations.  Gastrointestinal: Negative for constipation, heartburn and nausea.  Genitourinary: Negative for dysuria and urgency.       Nocturia X 3  Musculoskeletal: Positive for joint pain. Negative for myalgias.  Neurological: Positive for weakness. Negative for dizziness, focal weakness and headaches.  Psychiatric/Behavioral: The patient is not nervous/anxious and does not have insomnia.          Past Medical History:  Diagnosis Date  . DM2 (diabetes mellitus, type 2) ([place])   . HLD (hyperlipidemia)   . HTN (hypertension)          Past Surgical History:  Procedure Laterality Date  . ESOPHAGOGASTRODUODENOSCOPY (EGD) WITH PROPOFOL N/A 09/27/2018   Procedure: ESOPHAGOGASTRODUODENOSCOPY (EGD) WITH PROPOFOL;  Surgeon: Doran Stabler, MD;  Location: [facility];  Service: Gastroenterology;  Laterality: N/A;  . HOT HEMOSTASIS N/A 09/27/2018   Procedure: HOT HEMOSTASIS (ARGON PLASMA COAGULATION/BICAP);  Surgeon: Doran Stabler, MD;  Location: [facility];  Service: Gastroenterology;  Laterality: N/A;  . SUBMUCOSAL INJECTION  09/27/2018   Procedure: SUBMUCOSAL INJECTION;  Surgeon: Loletha Carrow,  Kirke Corin, MD;  Location: [facility] ENDOSCOPY;  Service: Gastroenterology;;    Family History  Problem Relation Age of Onset  . Diabetes Son     Social History:  reports that he has quit smoking. He does not have any smokeless tobacco history  on file. He reports that he does not drink alcohol or use drugs.    Allergies: No Known Allergies          Medications Prior to Admission  Medication Sig Dispense Refill  . acetaminophen (TYLENOL) 500 MG tablet Take 500 mg by mouth every 6 (six) hours as needed for headache (pain).    Marland Kitchen atenolol (TENORMIN) 100 MG tablet Take 100 mg by mouth daily.    . calcium elemental as carbonate (BARIATRIC TUMS ULTRA) 400 MG chewable tablet Chew 1,000 mg by mouth 3 (three) times daily as needed for heartburn (indigestion).    . furosemide (LASIX) 20 MG tablet Take 20 mg by mouth 2 (two) times daily.    Marland Kitchen glimepiride (AMARYL) 2 MG tablet Take 2 mg by mouth daily.    Marland Kitchen latanoprost (XALATAN) 0.005 % ophthalmic solution Place 1 drop into both eyes at bedtime.    Marland Kitchen lisinopril (PRINIVIL,ZESTRIL) 20 MG tablet Take 20 mg by mouth daily.    . montelukast (SINGULAIR) 10 MG tablet Take 10 mg by mouth at bedtime as needed (allergies).    . Multiple Vitamin (MULTIVITAMIN WITH MINERALS) TABS tablet Take 1 tablet by mouth daily. Centrum Silver    . pioglitazone (ACTOS) 30 MG tablet Take 30 mg by mouth daily.    . potassium chloride (K-DUR) 10 MEQ tablet Take 10 mEq by mouth daily.    . pravastatin (PRAVACHOL) 20 MG tablet Take 20 mg by mouth daily with supper.    . terazosin (HYTRIN) 5 MG capsule Take 5 mg by mouth daily with supper.    . Vitamin D, Ergocalciferol, (DRISDOL) 1.25 MG (50000 UT) CAPS capsule Take 50,000 Units by mouth every Wednesday.      Drug Regimen Review Drug regimen was reviewed and the following issues were identified and addressed anticoagulant, antihypertensive meds  Home: Home Living Family/patient expects to be discharged to:: Private residence Living Arrangements: Spouse/significant other Available Help at Discharge: Family, Available 24 hours/day Type of Home: Mobile home Home Access: Stairs to enter Entrance Stairs-Number of Steps: 5 Entrance  Stairs-Rails: Right, Left Home Layout: One level Bathroom Shower/Tub: Tub/shower unit, Multimedia programmer: Standard Home Equipment: None   Functional History: Prior Function Level of Independence: Independent Comments: Still driving and working, helping care for wife with mild cognitive deficits  Functional Status:  Mobility: Bed Mobility Overal bed mobility: Needs Assistance Bed Mobility: Supine to Sit Rolling: Min guard Sidelying to sit: Min guard, HOB elevated Supine to sit: Mod assist Sit to supine: Mod assist, HOB elevated General bed mobility comments: [facility] he was able to achieve sitting side of bed with min guard.   Transfers Overall transfer level: Needs assistance Equipment used: Rolling walker (2 wheeled) Transfers: Sit to/from Stand Sit to Stand: Min assist General transfer comment: Pt required min assistance from elevated surface height of bed, Pt is slow and guarded but able to achieve standing with improved ease from elevate surface height.  Ambulation/Gait Ambulation/Gait assistance: Mod assist Gait Distance (Feet): 100 Feet Assistive device: Rolling walker (2 wheeled) Gait Pattern/deviations: Wide base of support, Decreased stride length, Decreased dorsiflexion - right, Decreased dorsiflexion - left, Step-to pattern, Shuffle General Gait Details: Cues for  foot clearance, posture and upper trunk control.  Pt with LOB to the R required moderate assistance to correct.   Gait velocity: decreased  ADL: ADL Overall ADL's : Needs assistance/impaired Eating/Feeding: Supervision/ safety, Sitting Grooming: Wash/dry hands, Wash/dry face, Oral care, Sitting, Supervision/safety Upper Body Bathing: Min guard, Sitting Lower Body Bathing: Maximal assistance, Sit to/from stand Upper Body Dressing : Min guard, Sitting Lower Body Dressing: Maximal assistance, Sit to/from stand Toilet Transfer: Maximal assistance, RW, Ambulation Toileting- Clothing  Manipulation and Hygiene: Sit to/from stand, Moderate assistance Toileting - Clothing Manipulation Details (indicate cue type and reason): Pt able to complete 75% of peri cleansing from seated level, physical assist needed during standing Functional mobility during ADLs: Maximal assistance, Rolling walker General ADL Comments: Max A during functional mobility to bathroom. Unable to don socks sitting, even with starting over toes.   Cognition: Cognition Overall Cognitive Status: Impaired/Different from baseline Orientation Level: Oriented X4 Cognition Arousal/Alertness: Awake/alert Behavior During Therapy: WFL for tasks assessed/performed Overall Cognitive Status: Impaired/Different from baseline Area of Impairment: Safety/judgement Safety/Judgement: Decreased awareness of safety, Decreased awareness of deficits General Comments: Unaware of deficits   Blood pressure 119/60, pulse 69, temperature 97.8 F (36.6 C), temperature source Oral, resp. rate 16, height 5\' 10"  (1.778 m), weight 112 kg, SpO2 99 %. Physical Exam  Nursing note and vitals reviewed. Constitutional: He is oriented to person, place, and time. He appears well-developed and well-nourished.  Musculoskeletal:     Comments:   Erythema and tenderness at left great toe MTP joint--question gout. Tenderness left hand at prior IV site. No erythema or edema.  2+ pitting edema bilateral feet/ankles  Neurological: He is alert and oriented to person, place, and time.    General: No acute distress Mood and affect are appropriate Heart: Regular rate and rhythm no rubs murmurs or extra sounds Lungs: Clear to auscultation, breathing unlabored, no rales or wheezes Abdomen: Positive bowel sounds, soft nontender to palpation, nondistended Extremities: No clubbing, cyanosis, or edema Skin: No evidence of breakdown, no evidence of rash GU - condom cath, no scrotal edema Neurologic: Cranial nerves II through XII intact, motor  strength is 5/5 in bilateral deltoid, bicep, tricep, grip, hip flexor, knee extensors, ankle dorsiflexor and plantar flexor Sensory exam normal sensation to light touchbilateral upper and lower extremities Cerebellar exam normal finger to nose to finger as well as heel to shin in bilateral upper and lower extremities Musculoskeletal: Full range of motion in all 4 extremities LabResultsLast48Hours  Results for orders placed or performed during the hospital encounter of 09/25/18 (from the past 48 hour(s))  Glucose, capillary     Status: Abnormal   Collection Time: 09/30/18  5:46 PM  Result Value Ref Range   Glucose-Capillary 118 (H) 70 - 99 mg/dL  Glucose, capillary     Status: Abnormal   Collection Time: 09/30/18  8:06 PM  Result Value Ref Range   Glucose-Capillary 160 (H) 70 - 99 mg/dL  Glucose, capillary     Status: None   Collection Time: 10/01/18 12:19 AM  Result Value Ref Range   Glucose-Capillary 93 70 - 99 mg/dL  Glucose, capillary     Status: None   Collection Time: 10/01/18  4:45 AM  Result Value Ref Range   Glucose-Capillary 96 70 - 99 mg/dL  Glucose, capillary     Status: None   Collection Time: 10/01/18  8:02 AM  Result Value Ref Range   Glucose-Capillary 94 70 - 99 mg/dL  CBC     Status:  Abnormal   Collection Time: 10/01/18 11:29 AM  Result Value Ref Range   WBC 14.5 (H) 4.0 - 10.5 K/uL   RBC 2.87 (L) 4.22 - 5.81 MIL/uL   Hemoglobin 8.4 (L) 13.0 - 17.0 g/dL   HCT 26.9 (L) 39.0 - 52.0 %   MCV 93.7 80.0 - 100.0 fL   MCH 29.3 26.0 - 34.0 pg   MCHC 31.2 30.0 - 36.0 g/dL   RDW 15.2 11.5 - 15.5 %   Platelets 244 150 - 400 K/uL   nRBC 0.3 (H) 0.0 - 0.2 %    Comment: Performed at [facility] [address], [place], [place] [address]  Glucose, capillary     Status: Abnormal   Collection Time: 10/01/18 11:50 AM  Result Value Ref Range   Glucose-Capillary 132 (H) 70 - 99 mg/dL  Glucose, capillary     Status: Abnormal   Collection  Time: 10/01/18  4:22 PM  Result Value Ref Range   Glucose-Capillary 146 (H) 70 - 99 mg/dL  Glucose, capillary     Status: Abnormal   Collection Time: 10/01/18  8:19 PM  Result Value Ref Range   Glucose-Capillary 127 (H) 70 - 99 mg/dL  Glucose, capillary     Status: Abnormal   Collection Time: 10/02/18 12:08 AM  Result Value Ref Range   Glucose-Capillary 109 (H) 70 - 99 mg/dL  Glucose, capillary     Status: Abnormal   Collection Time: 10/02/18  4:43 AM  Result Value Ref Range   Glucose-Capillary 110 (H) 70 - 99 mg/dL  CBC with Differential/Platelet     Status: Abnormal   Collection Time: 10/02/18  4:49 AM  Result Value Ref Range   WBC 12.7 (H) 4.0 - 10.5 K/uL   RBC 2.90 (L) 4.22 - 5.81 MIL/uL   Hemoglobin 8.6 (L) 13.0 - 17.0 g/dL   HCT 27.0 (L) 39.0 - 52.0 %   MCV 93.1 80.0 - 100.0 fL   MCH 29.7 26.0 - 34.0 pg   MCHC 31.9 30.0 - 36.0 g/dL   RDW 15.1 11.5 - 15.5 %   Platelets 261 150 - 400 K/uL   nRBC 0.4 (H) 0.0 - 0.2 %   Neutrophils Relative % 78 %   Neutro Abs 9.9 (H) 1.7 - 7.7 K/uL   Lymphocytes Relative 14 %   Lymphs Abs 1.8 0.7 - 4.0 K/uL   Monocytes Relative 3 %   Monocytes Absolute 0.4 0.1 - 1.0 K/uL   Eosinophils Relative 1 %   Eosinophils Absolute 0.1 0.0 - 0.5 K/uL   Basophils Relative 0 %   Basophils Absolute 0.0 0.0 - 0.1 K/uL   nRBC 0 0 /100 WBC   Metamyelocytes Relative 3 %   Myelocytes 1 %   Abs Immature Granulocytes 0.50 (H) 0.00 - 0.07 K/uL   Burr Cells PRESENT    Polychromasia PRESENT     Comment: Performed at [facility], [address] [place], [place] [address]  Basic metabolic panel     Status: Abnormal   Collection Time: 10/02/18  4:49 AM  Result Value Ref Range   Sodium 140 135 - 145 mmol/L   Potassium 4.2 3.5 - 5.1 mmol/L   Chloride 104 98 - 111 mmol/L   CO2 28 22 - 32 mmol/L   Glucose, Bld 123 (H) 70 - 99 mg/dL   BUN 15 8 - 23 mg/dL   Creatinine, Ser 1.64 (H) 0.61 - 1.24 mg/dL    Calcium 7.8 (L)  8.9 - 10.3 mg/dL   GFR calc non Af Amer 39 (L) >60 mL/min   GFR calc Af Amer 45 (L) >60 mL/min   Anion gap 8 5 - 15    Comment: Performed at [facility] [address], [place], [place] [address]  Magnesium     Status: None   Collection Time: 10/02/18  4:49 AM  Result Value Ref Range   Magnesium 1.9 1.7 - 2.4 mg/dL    Comment: Performed at [facility] [address], [place], [place] [address]  Glucose, capillary     Status: Abnormal   Collection Time: 10/02/18  8:42 AM  Result Value Ref Range   Glucose-Capillary 101 (H) 70 - 99 mg/dL  Glucose, capillary     Status: Abnormal   Collection Time: 10/02/18 12:48 PM  Result Value Ref Range   Glucose-Capillary 133 (H) 70 - 99 mg/dL     ImagingResults(Last48hours)  No results found.       Medical Problem List and Plan: 1.  Debility  secondary to PNA, NSTEMI, GI bleed requiring transfusion 2.  DVT Prophylaxis/Anticoagulation: Mechanical: Sequential compression devices, below knee Bilateral lower extremities 3. Pain Management: tylenol prn  4. Mood: LCSW to follow for evaluation and support.  5. Neuropsych: This patient is capable of making decisions on his own behalf. 6. MASD/Skin/Wound Care: Routine pressure relief measures.  7. Fluids/Electrolytes/Nutrition: Monitor I/O. Check lytes in am. Add prostat. 8.E coli Urosepsis/ CAP: Completed 7 day course of antibiotic regimen today. Will check follow up CXR.  Continues to have congested cough. Incentive spiro ordered 9. GIB with melena: Continue to monitor for signs of bleeding and serial H/H. Continue Protonix BID for 4 wks then daily.  10. A fib/A flutter: Monitor HR bid. Continue amiodarone bid. .  11. Chronic diastolic CHF: Lasix resumed 2/11. Monitor weights daily and for other signs of overload. BP has been soft at times. 2+ peripheral edema 12. T2DM: Monitor BS ac/hs. Continue SSI for now. Resume Amaryl as intake improves.  13.  Leucocytosis: Resolving. Monitor for signs of infection.   Post Admission Physician Evaluation: 1. Functional deficits secondary  to Debility from PNA, NSTEMI, GIB. 2. Patient admitted to receive collaborative, interdisciplinary care between the physiatrist, rehab nursing staff, and therapy team. 3. Patient's level of medical complexity and substantial therapy needs in context of that medical necessity cannot be provided at a lesser intensity of care. 4. Patient has experienced substantial functional loss from his/her baseline. Judging by the patient's diagnosis, physical exam, and functional history, the patient has potential for functional progress which will result in measurable gains while on inpatient rehab.  These gains will be of substantial and practical use upon discharge in facilitating mobility and self-care at the household level. 5. Physiatrist will provide 24 hour management of medical needs as well as oversight of the therapy plan/treatment and provide guidance as appropriate regarding the interaction of the two. 6. 24 hour rehab nursing will assist in the management of  bladder management, bowel management, safety, skin/wound care, disease management, medication administration, pain management and patient education  and help integrate therapy concepts, techniques,education, etc. 7. PT will assess and treat for: DME instruction, Functional mobility training, Balance training, Patient/family education, Gait training, Therapeutic activities, Stair training, Therapeutic exercise.  Goals are: independent with assistive device. 8. OT will assess and treat for Self-care/ADL training, Therapeutic exercise, Energy conservation, DME and/or AE instruction, Balance training, Patient/family education, Cognitive remediation/compensation, Therapeutic activities, Modalities, Manual therapy.  Goals are: minimal assist.  9. SLP will assess and treat for  .  Goals are: supervision. 10. Case Management and  Social Worker will assess and treat for psychological issues and discharge planning. 11. Team conference will be held weekly to assess progress toward goals and to determine barriers to discharge. 12.  Patient will receive at least 3 hours of therapy per day at least 5 days per week. 13. ELOS and Prognosis: 14-17d excellent      "I have personally performed a face to face diagnostic evaluation of this patient.  Additionally, I have reviewed and concur with the physician assistant's documentation above." Charlett Blake M.D. [facility] FAAPM&R (Sports Med, Neuromuscular Med) Diplomate [place] Board of [place], PA-C 10/02/2018

## 2018-10-03 NOTE — Care Management Note (Signed)
[facility] Individual Statement of Services  Patient Name:  Erik Morales  Date:  10/03/2018  Welcome to the [facility].  Our goal is to provide you with an individualized program based on your diagnosis and situation, designed to meet your specific needs.  With this comprehensive rehabilitation program, you will be expected to participate in at least 3 hours of rehabilitation therapies Monday-Friday, with modified therapy programming on the weekends.  Your rehabilitation program will include the following services:  Physical Therapy (PT), Occupational Therapy (OT), 24 hour per day rehabilitation nursing, Therapeutic Recreaction (TR), Case Management (Social Worker), Rehabilitation Medicine, Nutrition Services and Pharmacy Services  Weekly team conferences will be held on Wednesday to discuss your progress.  Your Social Worker will talk with you frequently to get your input and to update you on team discussions.  Team conferences with you and your family in attendance may also be held.  Expected length of stay: 10 days  Overall anticipated outcome: independent with device  Depending on your progress and recovery, your program may change. Your Social Worker will coordinate services and will keep you informed of any changes. Your Social Worker's name and contact numbers are listed  below.  The following services may also be recommended but are not provided by the [facility] will be made to provide these services after discharge if needed.  Arrangements include referral to agencies that provide these services.  Your insurance has been verified to be:  [employer]-Medicare Your primary doctor is:    Pertinent information will be shared with your doctor and your insurance company.  Social Worker:  Ovidio Kin, [place]  or ([phone]  Information discussed with and copy given to patient by: Elease Hashimoto, 10/03/2018, 10:59 AM
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
## 2018-10-03 NOTE — Progress Notes (Signed)
Retta Diones, RN  Rehab Admission Coordinator  Physical Medicine and Rehabilitation  PMR Pre-admission  Signed  Date of Service:  10/02/2018 12:57 PM       Related encounter: Admission (Discharged) from 09/25/2018 in [place]         Show:Clear all [x] Manual[x] Template[x] Copied  Added by: [x] Karl Bales Evalee Mutton, RN  [] Hover for details PMR Admission Coordinator Pre-Admission Assessment  Patient: Erik Morales is an 78 y.o., male MRN: [id number] DOB: 1940/09/11 Height: 5' 10"  (177.8 cm) Weight: 112 kg                                                                                                                                                  Insurance Information HMO: Yes   PPO:       PCP:       IPA:       80/20:       OTHER:   PRIMARY:  UHC medicare      Policy#: [id number]      Subscriber: patient CM Name: Blanch Media      Phone#:       Fax#: [phone] Pre-Cert#: [id number]      Employer: Retired Benefits:  Phone #: [phone]     Name: On line portal Eff. Date: 08/20/18     Deduct: $0      Out of Pocket Max: $4500 (met $0)      Life Max: N/A CIR: $325 days 1-5      SNF: $0 days 1-20; $160 days 21-49; $0 days 50-100 Outpatient: medical necessity     Co-Pay: $35/visit Home Health: 100%      Co-Pay: none DME: 80%     Co-Pay: 20% Providers: in network  Medicaid Application Date:        Case Manager:   Disability Application Date:        Case Worker:    Emergency Contact Information         Contact Information    Name Relation Home Work [place], Janett Billow Granddaughter   [phone]   Gerda Diss  [phone]   [place], [place]  [phone]     Current Medical History  Patient Admitting Diagnosis: Debility  History of Present Illness:  A 78 y.o.right handedmalewith history of type 2 diabetes mellitus, hyperlipidemia, hypertension as well as history of tobacco use. Per  chart review,granddaughter, and patient,patient lives with spouse. Mobile home with 5 steps to entry. Independent prior to admission.Presented to [facility]/25/2018 with altered mental status. By report patient was found in a ditch by his wife had been there for an unknown period of time. Troponin 15.74,creatinine 1.86, BUN 56,WBC 24,800, hemoglobin 8.2.. Patient developed atrial fibrillation with RVR and placed on Eliquis per cardiology services and transferred to  [facility]. Chest x-ray showed small bilateral pleural effusions and questionable right lower lobe infiltrate. Renal ultrasound no hydronephrosis. Suspected GI bleed with noted hemoglobin 8.2 receive 4 units of packed red blood cells and his Eliquis was reversed. Underwent EGD showing nonbleeding duodenal ulcer with a visible vessel. Placed on PPI. Recommendations are to resume Eliquis 10/14/2018. Elevated troponin most likely secondary NSTEMI due to demand ischemia. Cardiac rate control with amiodarone. Patient completing course of IV antibiotics for sepsis due to pneumonia. Tolerating a regular consistency diet. Therapy evaluations completed with recommendations of physical medicine rehabilitation consult.    Past Medical History      Past Medical History:  Diagnosis Date  . DM2 (diabetes mellitus, type 2) ([place])   . HLD (hyperlipidemia)   . HTN (hypertension)     Family History  family history includes Diabetes in his son.  Prior Rehab/Hospitalizations: No previous rehab  Has the patient had major surgery during 100 days prior to admission? No  Current Medications   Current Facility-Administered Medications:  .  acetaminophen (TYLENOL) tablet 650 mg, 650 mg, Oral, Q6H PRN **OR** [DISCONTINUED] acetaminophen (TYLENOL) suppository 650 mg, 650 mg, Rectal, Q6H PRN, Etta Quill, DO .  amiodarone (PACERONE) tablet 200 mg, 200 mg, Oral, BID, Thurnell Lose, MD, 200 mg at 10/02/18 1056 .   amoxicillin-clavulanate (AUGMENTIN) 875-125 MG per tablet 1 tablet, 1 tablet, Oral, Q12H, Thurnell Lose, MD, 1 tablet at 10/02/18 1056 .  furosemide (LASIX) tablet 20 mg, 20 mg, Oral, BID, Thurnell Lose, MD, 20 mg at 10/02/18 0724 .  guaiFENesin-dextromethorphan (ROBITUSSIN DM) 100-10 MG/5ML syrup 5 mL, 5 mL, Oral, Q4H PRN, Thurnell Lose, MD, 5 mL at 09/28/18 2045 .  insulin aspart (novoLOG) injection 0-9 Units, 0-9 Units, Subcutaneous, Q4H, Nelida Meuse III, MD, 1 Units at 10/01/18 2035 .  montelukast (SINGULAIR) tablet 10 mg, 10 mg, Oral, QHS PRN, Thurnell Lose, MD .  pantoprazole (PROTONIX) EC tablet 40 mg, 40 mg, Oral, BID AC, Nelida Meuse III, MD, 40 mg at 10/02/18 1751  Patients Current Diet:     Diet Order                  Diet - low sodium heart healthy         Diet Carb Modified         Diet Carb Modified Fluid consistency: Thin; Room service appropriate? Yes; Fluid restriction: 1500 mL Fluid  Diet effective now               Precautions / Restrictions Precautions Precautions: Fall Restrictions Weight Bearing Restrictions: No   Has the patient had 2 or more falls or a fall with injury in the past year?No  Prior Activity Level Community (5-7x/wk): Went out daily, was driving.  Home Assistive Devices / Equipment Home Equipment: None  Prior Device Use: Indicate devices/aids used by the patient prior to current illness, exacerbation or injury? None  Prior Functional Level Prior Function Level of Independence: Independent Comments: Still driving and working, helping care for wife with mild cognitive deficits  Self Care: Did the patient need help bathing, dressing, using the toilet or eating?  Independent  Indoor Mobility: Did the patient need assistance with walking from room to room (with or without device)? Independent  Stairs: Did the patient need assistance with internal or external stairs (with or without device)?  Independent  Functional Cognition: Did the patient need help planning regular tasks such as shopping or  remembering to take medications? Independent  Current Functional Level Cognition  Overall Cognitive Status: Impaired/Different from baseline Orientation Level: Oriented X4 Safety/Judgement: Decreased awareness of safety, Decreased awareness of deficits General Comments: Unaware of deficits    Extremity Assessment (includes Sensation/Coordination)  Upper Extremity Assessment: Generalized weakness  Lower Extremity Assessment: Generalized weakness    ADLs  Overall ADL's : Needs assistance/impaired Eating/Feeding: Supervision/ safety, Sitting Grooming: Wash/dry hands, Wash/dry face, Oral care, Sitting, Supervision/safety Upper Body Bathing: Min guard, Sitting Lower Body Bathing: Maximal assistance, Sit to/from stand Upper Body Dressing : Min guard, Sitting Lower Body Dressing: Maximal assistance, Sit to/from stand Toilet Transfer: Maximal assistance, RW, Ambulation Toileting- Clothing Manipulation and Hygiene: Sit to/from stand, Moderate assistance Toileting - Clothing Manipulation Details (indicate cue type and reason): Pt able to complete 75% of peri cleansing from seated level, physical assist needed during standing Functional mobility during ADLs: Maximal assistance, Rolling walker General ADL Comments: Max A during functional mobility to bathroom. Unable to don socks sitting, even with starting over toes.     Mobility  Overal bed mobility: Needs Assistance Bed Mobility: Supine to Sit Rolling: Min guard Sidelying to sit: Min guard, HOB elevated Supine to sit: Mod assist Sit to supine: Mod assist, HOB elevated General bed mobility comments: With [facility] elevated he was able to achieve sitting side of bed with min guard.      Transfers  Overall transfer level: Needs assistance Equipment used: Rolling walker (2 wheeled) Transfers: Sit to/from Stand Sit to Stand: Min  assist General transfer comment: Pt required min assistance from elevated surface height of bed, Pt is slow and guarded but able to achieve standing with improved ease from elevate surface height.     Ambulation / Gait / Stairs / Wheelchair Mobility  Ambulation/Gait Ambulation/Gait assistance: Mod assist Gait Distance (Feet): 100 Feet Assistive device: Rolling walker (2 wheeled) Gait Pattern/deviations: Wide base of support, Decreased stride length, Decreased dorsiflexion - right, Decreased dorsiflexion - left, Step-to pattern, Shuffle General Gait Details: Cues for foot clearance, posture and upper trunk control.  Pt with LOB to the R required moderate assistance to correct.   Gait velocity: decreased    Posture / Balance Balance Overall balance assessment: Needs assistance Sitting-balance support: No upper extremity supported, Feet supported Sitting balance-Leahy Scale: Fair Standing balance support: Bilateral upper extremity supported, During functional activity Standing balance-Leahy Scale: Poor Standing balance comment: reliant on RW     Special needs/care consideration BiPAP/CPAP No CPM No Continuous Drip IV No Dialysis No      Life Vest No Oxygen No Special Bed No Trach Size No Wound Vac (area) No    Skin Has bruising on arms and a pressure injury                            Bowel mgmt: Last BM 10/01/18 Bladder mgmt: condom catheter Diabetic mgmt Yes, on oral medication at home    Previous Home Environment Living Arrangements: Spouse/significant other Available Help at Discharge: Family, Available 24 hours/day Type of Home: Mobile home Home Layout: One level Home Access: Stairs to enter Entrance Stairs-Rails: Right, Left Entrance Stairs-Number of Steps: 5 Bathroom Shower/Tub: Tub/shower unit, Multimedia programmer: Standard  Discharge Living Setting Plans for Discharge Living Setting: Patient's home, Lives with (comment), Mobile Home(Lives with  wife.) Type of Home at Discharge: Mobile home(double wide) Discharge Home Layout: One level Discharge Home Access: Stairs to enter Entrance Stairs-Number of Steps: 3 steps  at front plus a threshold entry step Discharge Bathroom Shower/Tub: Tub/shower unit, Walk-in shower, Curtain(Has both tub/shower and walk in shower) Discharge Bathroom Toilet: Standard Discharge Bathroom Accessibility: Yes How Accessible: Accessible via walker  Social/Family/Support Systems Patient Roles: Spouse, Other (Comment)(Has a wife and 4 grand children, son deceased.) Contact Information: Hakan Nudelman - grand daughter - [phone] Anticipated Caregiver: 4 grand children to rotate caregiver duties after discharge Ability/Limitations of Caregiver: son is deceased.  Four grand children are sharing staying with patient at discharge Caregiver Availability: Other (Comment)(Patient and grand daughter aware of potential need for 24/7 assistance ) Discharge Plan Discussed with Primary Caregiver: Yes Is Caregiver In Agreement with Plan?: Yes Does Caregiver/Family have Issues with Lodging/Transportation while Pt is in Rehab?: No  Goals/Additional Needs Patient/Family Goal for Rehab: PT/OT supervision to min assist goals (patient hopeful to achieve Mod I to supervision prior to home) Expected length of stay: 14-17 days Cultural Considerations: None Dietary Needs: Carb mod, thin liquids Equipment Needs: TBD Pt/Family Agrees to Admission and willing to participate: Yes Program Orientation Provided & Reviewed with Pt/Caregiver Including Roles  & Responsibilities: Yes  Decrease burden of Care through IP rehab admission: N/A  Possible need for SNF placement upon discharge: Not anticipated  Patient Condition: This patient's medical and functional status has changed since the consult dated: 09/29/18 in which the Rehabilitation Physician determined and documented that the patient's condition is appropriate for intensive  rehabilitative care in an inpatient rehabilitation facility. See "History of Present Illness" (above) for medical update. Functional changes are: Currently requiring mod assist to ambulate 100 feet RW. Patient's medical and functional status update has been discussed with the Rehabilitation physician and patient remains appropriate for inpatient rehabilitation. Will admit to inpatient rehab today.  Preadmission Screen Completed By:  Retta Diones, 10/02/2018 1:08 PM ______________________________________________________________________   Discussed status with Dr. Letta Pate on 10/02/18 at 1308 and received telephone approval for admission today.  Admission Coordinator:  Retta Diones, time 1308/Date 10/02/18           Cosigned by: Charlett Blake, MD at 10/02/2018 3:38 PM  Revision History

## 2018-10-04 LAB — GLUCOSE, CAPILLARY
GLUCOSE-CAPILLARY: 124 mg/dL — AB (ref 70–99)
Glucose-Capillary: 120 mg/dL — ABNORMAL HIGH (ref 70–99)
Glucose-Capillary: 154 mg/dL — ABNORMAL HIGH (ref 70–99)
Glucose-Capillary: 143 mg/dL — ABNORMAL HIGH (ref 70–99)

## 2018-10-04 NOTE — Progress Notes (Signed)
Continuing to note some bright red blood mixed in stool when patient on toilet. Patient denies any dizziness or discomfort. Vitals within patient's parameters. MD Naaman Plummer made aware. Continue with plan of care.

## 2018-10-04 NOTE — Plan of Care (Signed)
  Problem: Consults Goal: RH GENERAL PATIENT EDUCATION Description See Patient Education module for education specifics. Outcome: Progressing   Problem: RH BOWEL ELIMINATION Goal: RH STG MANAGE BOWEL WITH ASSISTANCE Description STG Manage Bowel with Mod Assistance.  Outcome: Progressing   Problem: RH BLADDER ELIMINATION Goal: RH STG MANAGE BLADDER WITH ASSISTANCE Description STG Manage Bladder With Mod Assistance  Outcome: Progressing   Problem: RH SKIN INTEGRITY Goal: RH STG SKIN FREE OF INFECTION/BREAKDOWN Description Pt will remain free of breakdown with min assist    Outcome: Progressing   Problem: RH SAFETY Goal: RH STG ADHERE TO SAFETY PRECAUTIONS W/ASSISTANCE/DEVICE Description STG Adhere to Safety Precautions With Min Assistance/Device.  Outcome: Progressing   Problem: RH PAIN MANAGEMENT Goal: RH STG PAIN MANAGED AT OR BELOW PT'S PAIN GOAL Description <3 out of 10.   Outcome: Progressing

## 2018-10-04 NOTE — Progress Notes (Signed)
[facility] PHYSICAL MEDICINE & REHABILITATION PROGRESS NOTE  Subjective/Complaints: Seemed to a little better last night. No new complaints this morning. Asked why a CXR was done yesterday.   ROS: Patient denies fever, rash, sore throat, blurred vision, nausea, vomiting, diarrhea, cough, shortness of breath or chest pain, joint or back pain, headache, or mood change.   Objective: Vital Signs: Blood pressure 97/60, pulse 75, temperature 97.9 F (36.6 C), temperature source Oral, resp. rate 16, height 5\' 9"  (1.753 m), weight 102.1 kg, SpO2 99 %. Dg Chest 2 View  Result Date: 10/03/2018 CLINICAL DATA:  Undergoing antibiotic therapy for sepsis and pneumonia. Bilateral pleural effusions. History of diabetes and congestive heart failure. EXAM: CHEST - 2 VIEW COMPARISON:  Radiographs 09/27/2018 and 09/24/2018. FINDINGS: Mild patient rotation to the right. The heart size and mediastinal contours are stable. There is a possible hiatal hernia based on the frontal examination, although not well seen on the lateral view. The lungs are clear. There is no pleural effusion or pneumothorax. No acute osseous findings are evident. IMPRESSION: No evidence of acute cardiopulmonary process. Electronically Signed   By: Richardean Sale M.D.   On: 10/03/2018 17:45   Recent Labs    10/02/18 0449 10/03/18 0511  WBC 12.7* 11.3*  HGB 8.6* 9.3*  HCT 27.0* 29.8*  PLT 261 274   Recent Labs    10/02/18 0449 10/03/18 0511  NA 140 138  K 4.2 3.7  CL 104 99  CO2 28 27  GLUCOSE 123* 133*  BUN 15 18  CREATININE 1.64* 1.63*  CALCIUM 7.8* 8.4*    Physical Exam: BP 97/60 (BP Location: Left Arm)   Pulse 75   Temp 97.9 F (36.6 C) (Oral)   Resp 16   Ht 5\' 9"  (1.753 m)   Wt 102.1 kg   SpO2 99%   BMI 33.24 kg/m  Constitutional: No distress . Vital signs reviewed. HEENT: EOMI, oral membranes moist Neck: supple Cardiovascular: RRR without murmur. No JVD    Respiratory: CTA Bilaterally without wheezes or  rales. Scattered rhonchi     GI: BS +, non-tender, non-distended  Musc: Bilateral lower extremity edema tr  Neurological: He is alert and oriented x3 Motor: Grossly 4+/5 throughout Skin: Warm and dry.  Intact. Psych: cooperative.  Assessment/Plan: 1. Functional deficits secondary to debility which require 3+ hours per day of interdisciplinary therapy in a comprehensive inpatient rehab setting.  Physiatrist is providing close team supervision and 24 hour management of active medical problems listed below.  Physiatrist and rehab team continue to assess barriers to discharge/monitor patient progress toward functional and medical goals  Care Tool:  Bathing  Bathing activity did not occur: Refused           Bathing assist       Upper Body Dressing/Undressing Upper body dressing   What is the patient wearing?: Pull over shirt    Upper body assist Assist Level: Set up assist    Lower Body Dressing/Undressing Lower body dressing      What is the patient wearing?: Pants     Lower body assist Assist for lower body dressing: Contact Guard/Touching assist     Toileting Toileting    Toileting assist Assist for toileting: Moderate Assistance - Patient 50 - 74%     Transfers Chair/bed transfer  Transfers assist     Chair/bed transfer assist level: Moderate Assistance - Patient 50 - 74%     Locomotion Ambulation   Ambulation assist   Ambulation activity did  not occur: Safety/medical concerns(could not walk without device)  Assist level: Moderate Assistance - Patient 50 - 74%   Max distance: 120'   Walk 10 feet activity   Assist  Walk 10 feet activity did not occur: Safety/medical concerns  Assist level: Moderate Assistance - Patient - 50 - 74% Assistive device: (without device, pt did not feel safe)   Walk 50 feet activity   Assist Walk 50 feet with 2 turns activity did not occur: Safety/medical concerns  Assist level: Moderate Assistance - Patient  - 50 - 74% Assistive device: Other (comment)(no device)    Walk 150 feet activity   Assist Walk 150 feet activity did not occur: Safety/medical concerns         Walk 10 feet on uneven surface  activity   Assist     Assist level: Moderate Assistance - Patient - 50 - 74% Assistive device: Aeronautical engineer Will patient use wheelchair at discharge?: No Type of Wheelchair: Manual    Wheelchair assist level: Contact Guard/Touching assist Max wheelchair distance: 100'    Wheelchair 50 feet with 2 turns activity    Assist        Assist Level: Contact Guard/Touching assist   Wheelchair 150 feet activity     Assist Wheelchair 150 feet activity did not occur: Safety/medical concerns          Medical Problem List and Plan: 1.  Debility  secondary to PNA, NSTEMI, GI bleed requiring transfusion  -Continue CIR therapies including PT, OT  2.  DVT Prophylaxis/Anticoagulation: Mechanical: Sequential compression devices, below knee Bilateral lower extremities 3. Pain Management: tylenol prn  4. Mood: LCSW to follow for evaluation and support.  5. Neuropsych: This patient is capable of making decisions on his own behalf. 6. MASD/Skin/Wound Care: Routine pressure relief measures.  7. Fluids/Electrolytes/Nutrition: Monitor I/Os.  8. E coli Urosepsis/ CAP: Completed 7 day course of antibiotic regimen on 2/13.   CXR personally reviewed and without active disease, d/w patient  Incentive spiro  9. GIB with melena: Continue to monitor for signs of bleeding and serial H/H. Continue Protonix BID for 4 wks then daily.   Hemoglobin 9.3 on 2/14  Continue to monitor 10. A fib/A flutter: Monitor HR bid. Continue amiodarone bid.  11. Chronic diastolic CHF: Lasix resumed 2/11. Monitor weights daily and for other signs of overload.   -need daily weights Filed Weights   10/02/18 1739  Weight: 102.1 kg  12. T2DM: Monitor BS ac/hs. Continue SSI for now.  Resume Amaryl as intake improves.   Reasonable control 2/15 13. Leucocytosis:   Monitor for signs of infection.   WBCs 11.3 on 2/14  Afebrile  Continue to monitor 14.  Hypoalbuminemia  Supplement initiated on 2/14 15.  CKD  Creatinine 1.63 on 2/14  Cont to monitor   LOS: 2 days A FACE TO FACE EVALUATION WAS PERFORMED  Meredith Staggers 10/04/2018, 8:55 AM

## 2018-10-05 ENCOUNTER — Inpatient Hospital Stay (HOSPITAL_COMMUNITY): Payer: Medicare Other

## 2018-10-05 LAB — GLUCOSE, CAPILLARY
GLUCOSE-CAPILLARY: 121 mg/dL — AB (ref 70–99)
Glucose-Capillary: 149 mg/dL — ABNORMAL HIGH (ref 70–99)
Glucose-Capillary: 101 mg/dL — ABNORMAL HIGH (ref 70–99)
Glucose-Capillary: 142 mg/dL — ABNORMAL HIGH (ref 70–99)

## 2018-10-05 MED ORDER — GLIMEPIRIDE 1 MG PO TABS
1.0000 mg | ORAL_TABLET | Freq: Every day | ORAL | Status: DC
  Administered 2018-10-05 – 2018-10-11 (×7): 1 mg via ORAL
  Filled 2018-10-05 (×7): qty 1

## 2018-10-05 NOTE — Plan of Care (Signed)
  Problem: Consults Goal: RH GENERAL PATIENT EDUCATION Description See Patient Education module for education specifics. Outcome: Progressing   Problem: RH BOWEL ELIMINATION Goal: RH STG MANAGE BOWEL WITH ASSISTANCE Description STG Manage Bowel with Mod Assistance.  Outcome: Progressing   Problem: RH BLADDER ELIMINATION Goal: RH STG MANAGE BLADDER WITH ASSISTANCE Description STG Manage Bladder With Mod Assistance  Outcome: Progressing   Problem: RH SKIN INTEGRITY Goal: RH STG SKIN FREE OF INFECTION/BREAKDOWN Description Pt will remain free of breakdown with min assist    Outcome: Progressing   Problem: RH SAFETY Goal: RH STG ADHERE TO SAFETY PRECAUTIONS W/ASSISTANCE/DEVICE Description STG Adhere to Safety Precautions With Min Assistance/Device.  Outcome: Progressing   Problem: RH PAIN MANAGEMENT Goal: RH STG PAIN MANAGED AT OR BELOW PT'S PAIN GOAL Description <3 out of 10.   Outcome: Progressing

## 2018-10-05 NOTE — Progress Notes (Signed)
Physical Therapy Session Note  Patient Details  Name: Erik Morales MRN: [id number] Date of Birth: [birth year]  Today's Date: 10/05/2018 PT Individual Time: 0830-0930 PT Individual Time Calculation (min): 60 min   Short Term Goals: Week 1:  PT Short Term Goal 1 (Week 1): STG=LTG due to ELOS  Skilled Therapeutic Interventions/Progress Updates:  Pt resting in bed.  Bed mobiity using bed features, to sit up with supervision.  Pt stated that he needed to use toilet for BM.  Pt with condom catheter on.  Gait in room iwht RW to toilet, min/mod assist for balance.  Toilet transfer with min assist.  Pt with blood in small amount of stool.   Pt washed hands and brushed hair at sink in standing, with 1 LOB requriing mod assist to regain.  W/c propulison using bil UEs for activity tolerance x 50' with superivsion before fatiguing.  Strengthening/ coordination activity, using KInetron from w/c level, at resistance 40 cm/sec x 25 cycles x 2.  Pt c/o on pain dorsum of bil feet.  Tiny bruise noted dorsum of R foot, and erythematous great toe/ metatarsal joint of L foot, with bunion.  Gait training with RW over level tile with multiple turns, x 80' with min guard assist.  Pt stated that his feet have loosened up, and are less painful.  Up/down (8) 3" high steps, bil rails, min assist.  Step through method.    Seated self stretching R/L hamstrings and heel cords with activation of quads and ankle DFs, x 30 seconds x 2.    Pt left resting in w/c with needs at hand and seat belt alarm set.  PT informed [place], LPN of pt's foot pain and stool status.    Therapy Documentation Precautions:  Precautions Precautions: Fall Restrictions Weight Bearing Restrictions: No Pain: pt denies, at rest  Therapy/Group: Individual Therapy  Iola Turri 10/05/2018, 10:11 AM

## 2018-10-05 NOTE — Progress Notes (Signed)
[facility] PHYSICAL MEDICINE & REHABILITATION PROGRESS NOTE  Subjective/Complaints: Patient states that he rested fairly well.  Nurse reported some bright red blood mixed in his stool.  Patient denies any other associated issues.  ROS: Patient denies fever, rash, sore throat, blurred vision, nausea, vomiting, diarrhea, cough, shortness of breath or chest pain, joint or back pain, headache, or mood change.    Objective: Vital Signs: Blood pressure 93/62, pulse 70, temperature 99 F (37.2 C), temperature source Oral, resp. rate 14, height 5\' 9"  (1.753 m), weight 96.9 kg, SpO2 97 %. Dg Chest 2 View  Result Date: 10/03/2018 CLINICAL DATA:  Undergoing antibiotic therapy for sepsis and pneumonia. Bilateral pleural effusions. History of diabetes and congestive heart failure. EXAM: CHEST - 2 VIEW COMPARISON:  Radiographs 09/27/2018 and 09/24/2018. FINDINGS: Mild patient rotation to the right. The heart size and mediastinal contours are stable. There is a possible hiatal hernia based on the frontal examination, although not well seen on the lateral view. The lungs are clear. There is no pleural effusion or pneumothorax. No acute osseous findings are evident. IMPRESSION: No evidence of acute cardiopulmonary process. Electronically Signed   By: Richardean Sale M.D.   On: 10/03/2018 17:45   Recent Labs    10/03/18 0511  WBC 11.3*  HGB 9.3*  HCT 29.8*  PLT 274   Recent Labs    10/03/18 0511  NA 138  K 3.7  CL 99  CO2 27  GLUCOSE 133*  BUN 18  CREATININE 1.63*  CALCIUM 8.4*    Physical Exam: BP 93/62 (BP Location: Right Arm)   Pulse 70   Temp 99 F (37.2 C) (Oral)   Resp 14   Ht 5\' 9"  (1.753 m)   Wt 96.9 kg   SpO2 97%   BMI 31.56 kg/m  Constitutional: No distress . Vital signs reviewed. HEENT: EOMI, oral membranes moist Neck: supple Cardiovascular: RRR without murmur. No JVD    Respiratory: CTA Bilaterally without wheezes or rales. Normal effort    GI: BS +, non-tender,  non-distended  Musc: Bilateral lower extremity edema trace Neurological: He is alert and oriented x3 Motor: Grossly 4+/5 throughout Skin: Warm and dry.  Intact. Psych: Flat  Assessment/Plan: 1. Functional deficits secondary to debility which require 3+ hours per day of interdisciplinary therapy in a comprehensive inpatient rehab setting.  Physiatrist is providing close team supervision and 24 hour management of active medical problems listed below.  Physiatrist and rehab team continue to assess barriers to discharge/monitor patient progress toward functional and medical goals  Care Tool:  Bathing  Bathing activity did not occur: Refused Body parts bathed by patient: Right arm, Left arm, Chest, Abdomen, Face   Body parts bathed by helper: Right lower leg, Left lower leg, Buttocks     Bathing assist Assist Level: Minimal Assistance - Patient > 75%     Upper Body Dressing/Undressing Upper body dressing   What is the patient wearing?: Pull over shirt    Upper body assist Assist Level: Set up assist    Lower Body Dressing/Undressing Lower body dressing      What is the patient wearing?: Pants     Lower body assist Assist for lower body dressing: Minimal Assistance - Patient > 75%     Toileting Toileting    Toileting assist Assist for toileting: Contact Guard/Touching assist     Transfers Chair/bed transfer  Transfers assist     Chair/bed transfer assist level: Contact Guard/Touching assist     Locomotion Ambulation  Ambulation assist   Ambulation activity did not occur: Safety/medical concerns(could not walk without device)  Assist level: Moderate Assistance - Patient 50 - 74%   Max distance: 120'   Walk 10 feet activity   Assist  Walk 10 feet activity did not occur: Safety/medical concerns  Assist level: Moderate Assistance - Patient - 50 - 74% Assistive device: (without device, pt did not feel safe)   Walk 50 feet activity   Assist Walk  50 feet with 2 turns activity did not occur: Safety/medical concerns  Assist level: Moderate Assistance - Patient - 50 - 74% Assistive device: Other (comment)(no device)    Walk 150 feet activity   Assist Walk 150 feet activity did not occur: Safety/medical concerns         Walk 10 feet on uneven surface  activity   Assist     Assist level: Moderate Assistance - Patient - 50 - 74% Assistive device: Aeronautical engineer Will patient use wheelchair at discharge?: No Type of Wheelchair: Manual    Wheelchair assist level: Contact Guard/Touching assist Max wheelchair distance: 100'    Wheelchair 50 feet with 2 turns activity    Assist        Assist Level: Contact Guard/Touching assist   Wheelchair 150 feet activity     Assist Wheelchair 150 feet activity did not occur: Safety/medical concerns          Medical Problem List and Plan: 1.  Debility  secondary to PNA, NSTEMI, GI bleed requiring transfusion  -Continue CIR therapies including PT, OT  2.  DVT Prophylaxis/Anticoagulation: Mechanical: Sequential compression devices, below knee Bilateral lower extremities 3. Pain Management: tylenol prn  4. Mood: LCSW to follow for evaluation and support.  5. Neuropsych: This patient is capable of making decisions on his own behalf. 6. MASD/Skin/Wound Care: Routine pressure relief measures.  7. Fluids/Electrolytes/Nutrition: Monitor I/Os.  8. E coli Urosepsis/ CAP: Completed 7 day course of antibiotic regimen on 2/13.   Recent chest x-ray without active disease, patient aware  Incentive spiro  9. GIB with melena: Continue to monitor for signs of bleeding and serial H/H. Continue Protonix BID for 4 wks then daily.   Hemoglobin 9.3 on 2/14  Only 1 stool with bright red blood intermingled  -Continue to monitor, check CBC tomorrow 10. A fib/A flutter: Monitor HR bid. Continue amiodarone bid.  11. Chronic diastolic CHF: Lasix resumed 2/11.  Monitor weights daily and for other signs of overload.   -need daily weights Filed Weights   10/02/18 1739 10/04/18 1445 10/05/18 0451  Weight: 102.1 kg 98.2 kg 96.9 kg  12. T2DM: Monitor BS ac/hs. Continue SSI for now.     Fair control 2/16  -Resume Amaryl 1 mg daily 13. Leucocytosis:   Monitor for signs of infection.   WBCs 11.3 on 2/14  Afebrile  Recheck tomorrow  14.  Hypoalbuminemia  Supplement initiated on 2/14 15.  CKD  Creatinine 1.63 on 2/14  Cont to monitor   LOS: 3 days A FACE TO FACE EVALUATION WAS PERFORMED  Meredith Staggers 10/05/2018, 8:25 AM

## 2018-10-05 NOTE — Progress Notes (Addendum)
Occupational Therapy Session Note  Patient Details  Name: Erik Morales MRN: [id number] Date of Birth: Jan 27, 1941  Today's Date: 10/05/2018 OT Individual Time: 2956-2130 Session 2: 1530-1630 OT Individual Time Calculation (min): 78 min Session 2: 60 min     Short Term Goals: Week 1:  OT Short Term Goal 1 (Week 1): Pt will don pants with min A OT Short Term Goal 2 (Week 1): Pt will complete shower transfer with CGA OT Short Term Goal 3 (Week 1): Pt will demonstrate improved activity tolerance by completing standing level grooming task with no rest break   Skilled Therapeutic Interventions/Progress Updates:    Session focused on shower level bathing/dressing, functional mobility, posture/positioning, and safety awareness with RW. Pt received sitting up in w/c with no c/o pain. Pt agreeable to shower, completing ~10 ft of functional mobility with CGA. Pt transferred onto bari-BSC in shower with CGA. Pt able to complete all bathing except distal LE with no assist. CGA provided during standing level peri cleansing. Pt required min A to don pants sitting on BSC. Set up to don shirt. Pt returned to w/c and was transported to therapy gym. A therapy ball was placed posterior to pt to facilitate back extension d/t highly kyphotic posture. Moderate to heavy manual facilitation provided to stretch pt's B pectoralis muscles, with moderate resistance. Pt unable to tolerate for long periods of time. Attempted to get pt into prone position to further facilitate back extension, but pt unable to tolerate, requiring max A to roll in and out and for UE management. Pt stood with RW and completed functional stepping onto 3 in block to mirror thresholds at home. Heavy cueing for neck extension and posture correction throughout session. Pt returned to room, completing 150 ft of functional mobility with RW with CGA. Pt left sitting up in recliner with chair alarm belt fastened and all needs met.   Session 2: Pt received  supine in bed, no c/o pain agreeable to therapy. Pt transitioned to EOB with CGA and used RW to stand with CGA. Pt completed ~15 ft of functional mobility into hallway when it was observed that pt's condom catheter was leaking. Pt was assisted back to room to change clothing. Discussion with pt re catheter use with NT Alycia present, including indications to remove, infection risk, and importance of mirroring home environment as much as possible. Pt resistant to removing catheter but agreeable upon education. NT removed catheter and pt donned new pants with min A. Pt was brought to therapy gym where he completed standing level [facility] task bimanually. Pt had to take several rest breaks d/t fatigue in standing and for holding B UE up. Pt returned to room and left supine with all needs met, bed alarm set. Pt provided with urinal and instructed in use.   Therapy Documentation Precautions:  Precautions Precautions: Fall Restrictions Weight Bearing Restrictions: No  Pain:  No pain reported    Therapy/Group: Individual Therapy  Curtis Sites 10/05/2018, 12:07 PM

## 2018-10-06 ENCOUNTER — Inpatient Hospital Stay (HOSPITAL_COMMUNITY): Payer: Medicare Other | Admitting: Occupational Therapy

## 2018-10-06 ENCOUNTER — Inpatient Hospital Stay (HOSPITAL_COMMUNITY): Payer: Medicare Other | Admitting: Physical Therapy

## 2018-10-06 DIAGNOSIS — R7309 Other abnormal glucose: Secondary | ICD-10-CM

## 2018-10-06 LAB — BASIC METABOLIC PANEL
Anion gap: 11 (ref 5–15)
BUN: 26 mg/dL — ABNORMAL HIGH (ref 8–23)
CHLORIDE: 98 mmol/L (ref 98–111)
CO2: 29 mmol/L (ref 22–32)
Calcium: 8.3 mg/dL — ABNORMAL LOW (ref 8.9–10.3)
Creatinine, Ser: 1.74 mg/dL — ABNORMAL HIGH (ref 0.61–1.24)
GFR calc Af Amer: 42 mL/min — ABNORMAL LOW (ref >60)
GFR calc non Af Amer: 36 mL/min — ABNORMAL LOW (ref >60)
Glucose, Bld: 121 mg/dL — ABNORMAL HIGH (ref 70–99)
POTASSIUM: 3.7 mmol/L (ref 3.5–5.1)
Sodium: 138 mmol/L (ref 135–145)

## 2018-10-06 LAB — GLUCOSE, CAPILLARY
Glucose-Capillary: 121 mg/dL — ABNORMAL HIGH (ref 70–99)
Glucose-Capillary: 106 mg/dL — ABNORMAL HIGH (ref 70–99)
Glucose-Capillary: 114 mg/dL — ABNORMAL HIGH (ref 70–99)
Glucose-Capillary: 127 mg/dL — ABNORMAL HIGH (ref 70–99)

## 2018-10-06 LAB — CBC
HCT: 30.4 % — ABNORMAL LOW (ref 39.0–52.0)
Hemoglobin: 9.4 g/dL — ABNORMAL LOW (ref 13.0–17.0)
MCH: 29.1 pg (ref 26.0–34.0)
MCHC: 30.9 g/dL (ref 30.0–36.0)
MCV: 94.1 fL (ref 80.0–100.0)
Platelets: 287 10*3/uL (ref 150–400)
RBC: 3.23 MIL/uL — AB (ref 4.22–5.81)
RDW: 14.6 % (ref 11.5–15.5)
WBC: 9.6 10*3/uL (ref 4.0–10.5)
nRBC: 0 % (ref 0.0–0.2)

## 2018-10-06 MED ORDER — FUROSEMIDE 20 MG PO TABS
20.0000 mg | ORAL_TABLET | Freq: Every day | ORAL | Status: DC
  Administered 2018-10-07 – 2018-10-10 (×4): 20 mg via ORAL
  Filled 2018-10-06 (×4): qty 1

## 2018-10-06 NOTE — Progress Notes (Signed)
[facility] PHYSICAL MEDICINE & REHABILITATION PROGRESS NOTE  Subjective/Complaints: Patient seen sitting up in bed this AM.  He states he slept well overnight.  He does not remember me.  He has questions regarding xray taken last week, reviewed with patient.  Family at bedside.    ROS: Denies CP, SOB, N/V/D  Objective: Vital Signs: Blood pressure (!) 119/53, pulse 67, temperature 98.3 F (36.8 C), temperature source Oral, resp. rate 17, height 5\' 9"  (1.753 m), weight 96.3 kg, SpO2 95 %. No results found. No results for input(s): WBC, HGB, HCT, PLT in the last 72 hours. No results for input(s): NA, K, CL, CO2, GLUCOSE, BUN, CREATININE, CALCIUM in the last 72 hours.  Physical Exam: BP (!) 119/53 (BP Location: Left Arm)   Pulse 67   Temp 98.3 F (36.8 C) (Oral)   Resp 17   Ht 5\' 9"  (1.753 m)   Wt 96.3 kg   SpO2 95%   BMI 31.37 kg/m  Constitutional: No distress . Vital signs reviewed. HENT: Normocephalic.  Atraumatic. Eyes: EOMI. No discharge. Cardiovascular: RRR. No JVD. Respiratory: CTA Bilaterally. Normal effort. GI: BS +. Non-distended. Musc: No edema or tenderness in extremities Neurological: He is alert and oriented x3 Motor: Grossly 4+/5 throughout Skin: Warm and dry.  Intact. Psych: Flat  Assessment/Plan: 1. Functional deficits secondary to debility which require 3+ hours per day of interdisciplinary therapy in a comprehensive inpatient rehab setting.  Physiatrist is providing close team supervision and 24 hour management of active medical problems listed below.  Physiatrist and rehab team continue to assess barriers to discharge/monitor patient progress toward functional and medical goals  Care Tool:  Bathing  Bathing activity did not occur: Refused Body parts bathed by patient: Right arm, Left arm, Chest, Abdomen, Face   Body parts bathed by helper: Right lower leg, Left lower leg, Buttocks     Bathing assist Assist Level: Minimal Assistance - Patient >  75%     Upper Body Dressing/Undressing Upper body dressing   What is the patient wearing?: Pull over shirt    Upper body assist Assist Level: Set up assist    Lower Body Dressing/Undressing Lower body dressing      What is the patient wearing?: Pants     Lower body assist Assist for lower body dressing: Minimal Assistance - Patient > 75%     Toileting Toileting    Toileting assist Assist for toileting: Contact Guard/Touching assist     Transfers Chair/bed transfer  Transfers assist     Chair/bed transfer assist level: Contact Guard/Touching assist     Locomotion Ambulation   Ambulation assist   Ambulation activity did not occur: Safety/medical concerns(could not walk without device)  Assist level: Moderate Assistance - Patient 50 - 74% Assistive device: Walker-rolling Max distance: 80   Walk 10 feet activity   Assist  Walk 10 feet activity did not occur: Safety/medical concerns  Assist level: Moderate Assistance - Patient - 50 - 74% Assistive device: Walker-rolling   Walk 50 feet activity   Assist Walk 50 feet with 2 turns activity did not occur: Safety/medical concerns  Assist level: Moderate Assistance - Patient - 50 - 74% Assistive device: Walker-rolling    Walk 150 feet activity   Assist Walk 150 feet activity did not occur: Safety/medical concerns         Walk 10 feet on uneven surface  activity   Assist     Assist level: Moderate Assistance - Patient - 50 - 74% Assistive  device: Aeronautical engineer Will patient use wheelchair at discharge?: No Type of Wheelchair: Manual    Wheelchair assist level: Supervision/Verbal cueing Max wheelchair distance: 50    Wheelchair 50 feet with 2 turns activity    Assist        Assist Level: Supervision/Verbal cueing   Wheelchair 150 feet activity     Assist Wheelchair 150 feet activity did not occur: Safety/medical concerns           Medical Problem List and Plan: 1.  Debility  secondary to PNA, NSTEMI, GI bleed requiring transfusion  Continue CIR  Weekend notes reviewed, cxr reviewed - unremarkable for acute process - reviewed with patient, labs reviewed.  2.  DVT Prophylaxis/Anticoagulation: Mechanical: Sequential compression devices, below knee Bilateral lower extremities 3. Pain Management: tylenol prn  4. Mood: LCSW to follow for evaluation and support.  5. Neuropsych: This patient is ?fully capable of making decisions on his own behalf. 6. MASD/Skin/Wound Care: Routine pressure relief measures.  7. Fluids/Electrolytes/Nutrition: Monitor I/Os.  8. E coli Urosepsis/ CAP: Completed 7 day course of antibiotic regimen on 2/13.   Recent chest x-ray reviewed, unremarkable for acute process  Incentive spiro  9. GIB with melena: Continue to monitor for signs of bleeding and serial H/H. Continue Protonix BID for 4 wks then daily.   Hemoglobin 9.4 on 2/17  Continue to monitor 10. A fib/A flutter: Monitor HR bid. Continue amiodarone bid.  11. Chronic diastolic CHF: Lasix resumed 2/11. Monitor weights daily and for other signs of overload.   -need daily weights Filed Weights   10/04/18 1445 10/05/18 0451 10/06/18 0517  Weight: 98.2 kg 96.9 kg 96.3 kg  12. T2DM: Monitor BS ac/hs. Continue SSI for now.     Resumed Amaryl 1 mg daily on 2/16, consider further increase if necessary  Labile on 2/17 13. Leucocytosis: Resolved  Monitor for signs of infection.   WBCs 9.6 on 2/17  Afebrile 14.  Hypoalbuminemia  Supplement initiated on 2/14 15.  CKD   Creatinine 1.63 on 2/14  Labs pending for today  Cont to monitor   LOS: 4 days A FACE TO FACE EVALUATION WAS PERFORMED  Nada Godley Lorie Phenix 10/06/2018, 8:11 AM

## 2018-10-06 NOTE — Progress Notes (Signed)
Multiple questions answered and issues addressed with patient as well as multiple family members. Discussed H/H will take time to recover and does not need to be checked daily at this time. Will check anemia panel.  Follow up labs later this week.  Discussed diagnosis leading to CIR stay as well as reasoning for diagnoses on acute due to acute issues. Goals of rehab likely supervision to modified independent depending on LOS. Therapy contacted to clear family for transfers/BR assist.   They expressed concerns about starting blood thinner and recurrent bleed--discussed bleeding risks with low dose ASA and Eliquis equal according to studies but Eliquis better at stroke prevention.   Frequency/urgency likely due to diuretics leading to incontinence and cause of embarrassment to patient. His weight down and peripheral  edema has resolved. CHF compensated with rise in BUN/SCr--will decrease lasix to daily.

## 2018-10-06 NOTE — Progress Notes (Signed)
Occupational Therapy Session Note  Patient Details  Name: Erik Morales MRN: [id number] Date of Birth: Apr 06, 1941  Today's Date: 10/06/2018 OT Individual Time: 0945-1100 OT Individual Time Calculation (min): 75 min    Short Term Goals: Week 1:  OT Short Term Goal 1 (Week 1): Pt will don pants with min A OT Short Term Goal 2 (Week 1): Pt will complete shower transfer with CGA OT Short Term Goal 3 (Week 1): Pt will demonstrate improved activity tolerance by completing standing level grooming task with no rest break   Skilled Therapeutic Interventions/Progress Updates:    Pt seen for OT session focusing on functional mobility and transfers. Pt in supine upon arrival with granddaughter present. Pt agreeable to session and denying pain.  Granddaughter and pt with multiple concerns including toileting task and d/c. Pt currently using condom cath, provided education regarding d/c condom cath in prep for d/c. Pt provided with male urinal as difficulty managing male urinal. Discussed sitting on EOB in order to assist with positioning during toileting task. Pt's family requesting to speak with Director of Nursing, she was made aware during session.  Removed condom cath and pt donned pants, no brief to make for easier/quicker accessibility. LB dressing completed EOB with supervision using RW for sit>stand. He ambulated within room with close supervision using RW, completed grooming tasks standing at sink. He self propelled w/c to ADL apartment. Completed functional ambulation within apartment and over uneven surface with close supervision. He completed sit<>Stand from low soft recliner with VCs for positioning/techniques and supervision. Completed simulated tub/shower transfer utilizing tub bench, completed with supervision, pt able to recall technique of transfer taught in previous tx session.  He ambulated to therapy gym, ~30 ft before requiring extended seated rest break.  Completed obstacle course  ambulation, weaving btwn cones. VCs for wide BOS, x2 episodes of scissoring which pt was able to self correct. Education provided regarding fall risk and increasing BOS. Pt returned to room at end of session via w/c. Ambulated into room with CGA. Pt left seated in recliner at end of session with family members present. Pt and family very motivated for pt to d/c. Discussed therapy goals, downgrading of goals if pt wishing to leave sooner. Pt and family to discuss this today and decide. CSW made aware and entering room as therapist exited.   Therapy Documentation Precautions:  Precautions Precautions: Fall Restrictions Weight Bearing Restrictions: No Pain:   No/denies pain   Therapy/Group: Individual Therapy  Erik Morales 10/06/2018, 7:00 AM

## 2018-10-06 NOTE — Progress Notes (Signed)
Spoke with wife and granddaughter with updates and support

## 2018-10-06 NOTE — Plan of Care (Signed)
  Problem: Consults Goal: RH GENERAL PATIENT EDUCATION Description See Patient Education module for education specifics. Outcome: Progressing   Problem: RH BOWEL ELIMINATION Goal: RH STG MANAGE BOWEL WITH ASSISTANCE Description STG Manage Bowel with Mod Assistance.  Outcome: Progressing   Problem: RH BLADDER ELIMINATION Goal: RH STG MANAGE BLADDER WITH ASSISTANCE Description STG Manage Bladder With Mod Assistance  Outcome: Progressing   Problem: RH SKIN INTEGRITY Goal: RH STG SKIN FREE OF INFECTION/BREAKDOWN Description Pt will remain free of breakdown with min assist    Outcome: Progressing   Problem: RH SAFETY Goal: RH STG ADHERE TO SAFETY PRECAUTIONS W/ASSISTANCE/DEVICE Description STG Adhere to Safety Precautions With Min Assistance/Device.  Outcome: Progressing   Problem: RH PAIN MANAGEMENT Goal: RH STG PAIN MANAGED AT OR BELOW PT'S PAIN GOAL Description <3 out of 10.   Outcome: Progressing

## 2018-10-06 NOTE — Progress Notes (Signed)
Physical Therapy Session Note  Patient Details  Name: Erik Morales MRN: [id number] Date of Birth: [birth year]  Today's Date: 10/06/2018 PT Individual Time: 1415-1525 PT Individual Time Calculation (min): 70 min   Short Term Goals: Week 1:  PT Short Term Goal 1 (Week 1): STG=LTG due to ELOS  Skilled Therapeutic Interventions/Progress Updates:    Pt received seated in w/c in room, agreeable to PT session. Manual w/c propulsion 2 x 100 ft with use of BUE and Supervision for global endurance training. Pt transfers sit to stand with CGA to RW throughout session. Ambulation 2 x 200 ft with RW and min A for gait, v/c for upright posture, pt significantly kyphotic. Ascend/descend 4 stairs with 2 handrails and min A, step-to gait pattern on 6" stairs. Seated BLE therex x 10 reps with orange theraband: marches, LAQ, heel/toe raises, HS curls, hip abd/add. Pt's grand-daughters Janett Billow and Judson Roch present during session for hands-on family training with how to manage seat belt alarm and how to safely assist pt to the bathroom. Both grand-daughters are able to observe therapist perform transfer and short distance gait with pt with RW then demonstrate good safety and awareness of how to assist pt. Janett Billow and Judson Roch have been signed off to assist pt with short distance gait with RW to the bathroom, safety plan updated. Also performed hands-on family training with how to manage seat belt alarm. After discussion with team it was decided that pt should remain on seat belt alarm at this time for safety. Pt left seated in w/c in room with needs in reach, quick release belt and chair alarm in place, family present.  Therapy Documentation Precautions:  Precautions Precautions: Fall Restrictions Weight Bearing Restrictions: No Pain: Pain Assessment Pain Scale: 0-10 Pain Score: 0-No pain    Therapy/Group: Individual Therapy   Excell Seltzer, PT, DPT  10/06/2018, 3:32 PM

## 2018-10-06 NOTE — Progress Notes (Signed)
Occupational Therapy Session Note  Patient Details  Name: Erik Morales MRN: [id number] Date of Birth: October 07, 1940  Today's Date: 10/06/2018 OT Individual Time: 1130-1210 OT Individual Time Calculation (min): 40 min    Short Term Goals: Week 1:  OT Short Term Goal 1 (Week 1): Pt will don pants with min A OT Short Term Goal 2 (Week 1): Pt will complete shower transfer with CGA OT Short Term Goal 3 (Week 1): Pt will demonstrate improved activity tolerance by completing standing level grooming task with no rest break   Skilled Therapeutic Interventions/Progress Updates:    Patient seated in recliner with family present, denies pain and ready for therapy session.  Family stepped out for lunch.  Patient completed functional transfers to/from toilet/commode, bed, recliner, w/c with RW and CG/CS, occ mild LOB with CG A to steady.  Toileting with CS/CG using grab bar.  Unsupported sitting for 10 minutes with DS, able to eat lunch independently.  Ambulation with RW > 100 feet x 2 with CG.  Patient is pleasant, cooperative and aware of current deficits.  Endurance and balance deficits are limiting safety at this time.  Patient seated in w/c with seat belt alarm set at close of session, tray table and call bell accessible  Therapy Documentation Precautions:  Precautions Precautions: Fall Restrictions Weight Bearing Restrictions: No General:   Vital Signs:  Pain: Pain Assessment Pain Scale: 0-10 Pain Score: 0-No pain   Other Treatments:     Therapy/Group: Individual Therapy  Carlos Levering 10/06/2018, 12:25 PM

## 2018-10-06 NOTE — Progress Notes (Signed)
Occupational Therapy Session Note  Patient Details  Name: Erik Morales MRN: [id number] Date of Birth: [birth year]  Today's Date: 10/06/2018 OT Individual Time: 1300-1345 OT Individual Time Calculation (min): 45 min    Short Term Goals: Week 1:  OT Short Term Goal 1 (Week 1): Pt will don pants with min A OT Short Term Goal 2 (Week 1): Pt will complete shower transfer with CGA OT Short Term Goal 3 (Week 1): Pt will demonstrate improved activity tolerance by completing standing level grooming task with no rest break   Skilled Therapeutic Interventions/Progress Updates:    Patient seated in w/c and ready for afternoon session.  He denies pain or needing to use the bathroom.   Reviewed and practiced reach and transport techniques in the kitchen area.  He requires min cues and CG A with RW for reaching into refrigerator and cabinets, min cues to recall other methods reviewed.  Standing tolerance for HM practice is 5 minutes. Patient incontinent of urine requiring Assistance to clean up the area soiled, set up to wash up, min A for LB undressing and dressing (to include brief), min A for slip on shoes.  Reviewed option for use of pull ups at discharge if incontinence/urgency does not improve.  Provided clean and dry w/c cushion. Ambulation on unit approx 100 feet with CG A. Patient returned to w/c at close of session with family present and needs met.    Therapy Documentation Precautions:  Precautions Precautions: Fall Restrictions Weight Bearing Restrictions: No General:   Vital Signs:  Pain: Pain Assessment Pain Scale: 0-10 Pain Score: 0-No pain   Other Treatments:     Therapy/Group: Individual Therapy  Carlos Levering 10/06/2018, 1:49 PM

## 2018-10-06 NOTE — Progress Notes (Signed)
Social Work Patient ID: Erik Morales, male   DOB: [birth year], 78 y.o.   MRN: [id number] Met with pt, daughter in-law and two granddaughter's to address their concerns. They have many medical questions and nursing concerns. Have asked Pam-PA to see and answer their medical questions and jennifer Bailey-Director of RN to address their concerns. Pt wants to wear a condom cath at night so not to have an accident and nursing to address. Wants to know reason on a fluid restriction and the result of his chest x-ray done on Friday. Team feels may be ready end of the week to return home. Pt is concerned about wife and her care, since his brother in-law does not feel he can do it anymore and wants granddaughter's to find a place for her while pt is hospitalized. Which is causing all of them added stress. Have given them a list of private duty agencies and memory care facilities to pursue.

## 2018-10-07 ENCOUNTER — Inpatient Hospital Stay (HOSPITAL_COMMUNITY): Payer: Medicare Other | Admitting: Physical Therapy

## 2018-10-07 ENCOUNTER — Inpatient Hospital Stay (HOSPITAL_COMMUNITY): Payer: Medicare Other | Admitting: Occupational Therapy

## 2018-10-07 LAB — IRON AND TIBC
Iron: 37 ug/dL — ABNORMAL LOW (ref 45–182)
Saturation Ratios: 16 % — ABNORMAL LOW (ref 17.9–39.5)
TIBC: 228 ug/dL — ABNORMAL LOW (ref 250–450)
UIBC: 191 ug/dL

## 2018-10-07 LAB — FOLATE: Folate: 8.1 ng/mL (ref >5.9)

## 2018-10-07 LAB — GLUCOSE, CAPILLARY
GLUCOSE-CAPILLARY: 109 mg/dL — AB (ref 70–99)
Glucose-Capillary: 108 mg/dL — ABNORMAL HIGH (ref 70–99)
Glucose-Capillary: 125 mg/dL — ABNORMAL HIGH (ref 70–99)
Glucose-Capillary: 124 mg/dL — ABNORMAL HIGH (ref 70–99)

## 2018-10-07 LAB — RETICULOCYTES
Immature Retic Fract: 29.5 % — ABNORMAL HIGH (ref 2.3–15.9)
RBC.: 3.09 MIL/uL — ABNORMAL LOW (ref 4.22–5.81)
Retic Count, Absolute: 138.7 10*3/uL (ref 19.0–186.0)
Retic Ct Pct: 4.5 % — ABNORMAL HIGH (ref 0.4–3.1)

## 2018-10-07 LAB — FERRITIN: FERRITIN: 130 ng/mL (ref 24–336)

## 2018-10-07 LAB — VITAMIN B12: Vitamin B-12: 400 pg/mL (ref 180–914)

## 2018-10-07 MED ORDER — POLYSACCHARIDE IRON COMPLEX 150 MG PO CAPS
150.0000 mg | ORAL_CAPSULE | Freq: Two times a day (BID) | ORAL | Status: DC
  Administered 2018-10-07 – 2018-10-10 (×7): 150 mg via ORAL
  Filled 2018-10-07 (×7): qty 1

## 2018-10-07 NOTE — Progress Notes (Signed)
Occupational Therapy Session Note  Patient Details  Name: Erik Morales MRN: [id number] Date of Birth: [birth year]  Today's Date: 10/07/2018 OT Individual Time: 7824-2353 and 1100-1200 OT Individual Time Calculation (min): 75 min and 60 min   Short Term Goals: Week 1:  OT Short Term Goal 1 (Week 1): Pt will don pants with min A OT Short Term Goal 2 (Week 1): Pt will complete shower transfer with CGA OT Short Term Goal 3 (Week 1): Pt will demonstrate improved activity tolerance by completing standing level grooming task with no rest break   Skilled Therapeutic Interventions/Progress Updates:    Session One: Pt seen for OT ADL bathing/dressing session. Pt eating breakfast in supine upon arrival, denied pain and agreeable to tx session. Pt agreeable to transitioning to sitting EOB to finish breakfast, transitioned with supervision using hospital bed functions.  Throughout session, pt perseverative on amount of blood being taken from him for labs, MD and OT Provided education throughout regarding purpose, however, pt difficult to redirect. He ambulated throughout room with RW and close supervision. He gathered clothing items from closet, demonstrating good functional management of RW. He required assist for controlled descent onto standard toilet 2/2 poor descentric control and mod a with use of grab bars to stand from low height. Steadying assist for toileting task. He transitioned into shower with CGA And bathed seated on 3-1 BSC with supervision.  He dressed seated in chair, min A required for donning very tight fitting shirt. Supervision with increased time for LB dressing, completing sit>stand at [facility] during clothing management. He stood at sink with supervision to brush hair, sitting to complete shaving task for energy conservation.  Required seated rest breaks throughout ADL routine due to decreased functional activity tolerance.  Following seated rest break, pt ambulated throughout unit, close  supervision with VCs for   Upright posture. Seated rest break provided in ADL apartment. Pt able to completed sit<> stand from low surface couch with supervision. He returned to room in same manner as described above. Pt left seated in w/c at end of session, chair belt alarm on and family member present.   Session Two: Pt seen for OT session focusing on functional mobility, activity tolerance, and IADL re-training. Pt sitting up in w/c upon arrival with granddaughter present, voicing fatigue from previous sessions, but willing to participate in therapy as able. Pt taken off unit outside total A in w/c for time and energy conservation. He ambulated outside over uneven surfaces and over obstacles with guarding assist and VCs for RW management.  Following seated rest break, ambulated within hospital gift store in crowded environment. Retrieved items from overhead with supervision and navigated safely throughout room. Pt returned to unit. Completed simulated laundry task, pt able to remove and replace items from top loading washer and front loading dryer with supervision.  Discussion/education at length throughout session regarding pt goals at d/c, return to activity, energy conservation and d/c planning. Pt very motivated to return to driving and his active lifestyle. Education provided to pt and granddaughter regarding recommendations and return to activity at d/c. Pt returned to room, requesting toileting task. Ambulated into bathroom with RW and completed toileting task with close supervision, hand hygiene at sink.  Pt left seated in w/c at end of session, all needs in reach, chair belt alarm on and granddaughter present.   Therapy Documentation Precautions:  Precautions Precautions: Fall Restrictions Weight Bearing Restrictions: No Pain:   No/denies pain   Therapy/Group: Individual Therapy  Erik Morales,  Erik Morales L 10/07/2018, 7:00 AM

## 2018-10-07 NOTE — Progress Notes (Deleted)
Anticipates discharge home today, patient states she can rest better and recover better at home, Pain score at 8/10 on pain scale, monitor and assisted  Error in charting on this patient

## 2018-10-07 NOTE — Progress Notes (Signed)
[facility] PHYSICAL MEDICINE & REHABILITATION PROGRESS NOTE  Subjective/Complaints: Patient seen sitting up at the edge of his bed this morning about to work with therapies.  He states he slept well overnight.  He is upset about the fact that he has been getting blood draws.  However, family upset that patient has not been having labs checked more frequently.  Prolonged discussion with family and PA yesterday, please see note.  ROS: Denies CP, SOB, N/V/D  Objective: Vital Signs: Blood pressure (!) 88/57, pulse 64, temperature 98 F (36.7 C), temperature source Oral, resp. rate 15, height 5\' 9"  (1.753 m), weight 96.1 kg, SpO2 (!) 9 %. No results found. Recent Labs    10/06/18 0740  WBC 9.6  HGB 9.4*  HCT 30.4*  PLT 287   Recent Labs    10/06/18 0740  NA 138  K 3.7  CL 98  CO2 29  GLUCOSE 121*  BUN 26*  CREATININE 1.74*  CALCIUM 8.3*    Physical Exam: BP (!) 88/57 (BP Location: Right Arm)   Pulse 64   Temp 98 F (36.7 C) (Oral)   Resp 15   Ht 5\' 9"  (1.753 m)   Wt 96.1 kg   SpO2 (!) 9%   BMI 31.29 kg/m  Constitutional: No distress . Vital signs reviewed. HENT: Normocephalic.  Atraumatic. Eyes: EOMI. No discharge. Cardiovascular: RRR.  No JVD. Respiratory: CTA bilaterally.  Normal effort. GI: BS +. Non-distended. Musc: No edema or tenderness in extremities Neurological: He is alert and oriented Motor: Grossly 4+/5 throughout, stable Skin: Warm and dry.  Intact. Psych: Flat, agitated  Assessment/Plan: 1. Functional deficits secondary to debility which require 3+ hours per day of interdisciplinary therapy in a comprehensive inpatient rehab setting.  Physiatrist is providing close team supervision and 24 hour management of active medical problems listed below.  Physiatrist and rehab team continue to assess barriers to discharge/monitor patient progress toward functional and medical goals  Care Tool:  Bathing  Bathing activity did not occur: Refused Body  parts bathed by patient: Right arm, Left arm, Chest, Abdomen, Face, Front perineal area, Buttocks, Right upper leg, Left upper leg, Right lower leg, Left lower leg   Body parts bathed by helper: Right lower leg, Left lower leg, Buttocks     Bathing assist Assist Level: Contact Guard/Touching assist     Upper Body Dressing/Undressing Upper body dressing   What is the patient wearing?: Pull over shirt    Upper body assist Assist Level: Minimal Assistance - Patient > 75%    Lower Body Dressing/Undressing Lower body dressing      What is the patient wearing?: Pants, Incontinence brief     Lower body assist Assist for lower body dressing: Minimal Assistance - Patient > 75%     Toileting Toileting    Toileting assist Assist for toileting: Contact Guard/Touching assist     Transfers Chair/bed transfer  Transfers assist     Chair/bed transfer assist level: Contact Guard/Touching assist     Locomotion Ambulation   Ambulation assist   Ambulation activity did not occur: Safety/medical concerns(could not walk without device)  Assist level: Minimal Assistance - Patient > 75% Assistive device: Walker-rolling Max distance: 200'   Walk 10 feet activity   Assist  Walk 10 feet activity did not occur: Safety/medical concerns  Assist level: Minimal Assistance - Patient > 75% Assistive device: Walker-rolling   Walk 50 feet activity   Assist Walk 50 feet with 2 turns activity did not occur: Safety/medical  concerns  Assist level: Minimal Assistance - Patient > 75% Assistive device: Walker-rolling    Walk 150 feet activity   Assist Walk 150 feet activity did not occur: Safety/medical concerns  Assist level: Minimal Assistance - Patient > 75% Assistive device: Walker-rolling    Walk 10 feet on uneven surface  activity   Assist     Assist level: Moderate Assistance - Patient - 50 - 74% Assistive device: Aeronautical engineer Will  patient use wheelchair at discharge?: No Type of Wheelchair: Manual    Wheelchair assist level: Supervision/Verbal cueing Max wheelchair distance: 100'    Wheelchair 50 feet with 2 turns activity    Assist        Assist Level: Supervision/Verbal cueing   Wheelchair 150 feet activity     Assist Wheelchair 150 feet activity did not occur: Safety/medical concerns          Medical Problem List and Plan: 1.  Debility  secondary to PNA, NSTEMI, GI bleed requiring transfusion  Continue CIR 2.  DVT Prophylaxis/Anticoagulation: Mechanical: Sequential compression devices, below knee Bilateral lower extremities 3. Pain Management: tylenol prn  4. Mood: LCSW to follow for evaluation and support.  5. Neuropsych: This patient is ?fully capable of making decisions on his own behalf. 6. MASD/Skin/Wound Care: Routine pressure relief measures.  7. Fluids/Electrolytes/Nutrition: Monitor I/Os.  8. E coli Urosepsis/ CAP: Completed 7 day course of antibiotic regimen on 2/13.   Recent chest x-ray reviewed with patient, unremarkable for acute process  Incentive spiro  9. GIB with melena: Continue to monitor for signs of bleeding and serial H/H. Continue Protonix BID for 4 wks then daily.   Iron studies suggesting iron deficiency anemia  Hemoglobin 9.4 on 2/17  Continue to monitor 10. A fib/A flutter: Monitor HR bid. Continue amiodarone bid.  11. Chronic diastolic CHF: Lasix resumed 2/11, decreased on 2/17  Monitor weights daily and for other signs of overload.  Filed Weights   10/06/18 1731 10/07/18 0601 10/07/18 0605  Weight: 96.3 kg 96.1 kg 96.1 kg   Stable on 2/18 12. T2DM: Monitor BS ac/hs. Continue SSI for now.     Resumed Amaryl 1 mg daily on 2/16, consider further increase if necessary  Relatively controlled on 2/18 13. Leucocytosis: Resolved  Monitor for signs of infection.   WBCs 9.6 on 2/17  Afebrile 14.  Hypoalbuminemia  Supplement initiated on 2/14 15.  CKD    Creatinine 1.74 on 2/17  Encourage fluids  Cont to monitor   Greater than 35 minutes spent with greater than 30 minutes total in counseling between myself and PA with patient and family regarding hemoglobin, GI bleed, Lasix, blood pressure, labs.  LOS: 5 days A FACE TO FACE EVALUATION WAS PERFORMED  Avantae Bither Lorie Phenix 10/07/2018, 9:09 AM

## 2018-10-07 NOTE — Progress Notes (Signed)
Physical Therapy Session Note  Patient Details  Name: Erik Morales MRN: [id number] Date of Birth: 1940-12-01  Today's Date: 10/07/2018 PT Individual Time: 0915-1015 PT Individual Time Calculation (min): 60 min   Short Term Goals: Week 1:  PT Short Term Goal 1 (Week 1): STG=LTG due to ELOS  Skilled Therapeutic Interventions/Progress Updates:    Pt received seated in w/c in room, agreeable to PT session. Sit to stand with CGA to RW throughout session. Ambulation x 200 ft with RW and CGA. Ascend/descend 4 stairs with 2 handrails and min A, step-through gait pattern. Ambulation through obstacle course with RW and CGA, focus on stepping over and RW management stepping over obstacles. Sidesteps L/R 2 x 15 ft with BUE support and min A for balance, focus on upright standing posture. Trial gait with rail in hallway and CGA for balance 2 x 30 ft. Trial gait with no AD and min A for balance, increase in ataxia, onset of "shuffling" gait pattern. Will continue to use RW for gait at this time. Manual w/c propulsion x 100 ft with BUE and Supervision. Pt left seated in w/c in room with needs in reach, quick release belt and chair alarm in place.  Therapy Documentation Precautions:  Precautions Precautions: Fall Restrictions Weight Bearing Restrictions: No Pain: Pain Assessment Pain Scale: 0-10 Pain Score: 0-No pain    Therapy/Group: Individual Therapy   Excell Seltzer, PT, DPT  10/07/2018, 12:38 PM

## 2018-10-07 NOTE — Progress Notes (Signed)
Social Work Patient ID: Erik Morales, male   DOB: 09-04-1940, 78 y.o.   MRN: [id number] Met with two granddaughter's and pt to discuss concerns and how well pt is doing. He will be going to jessica's house for one week before returning to his home after discharge from the hospital. Will work on equipment needs and follow up therapies needs. He hopes to go home by this weekend.

## 2018-10-08 ENCOUNTER — Inpatient Hospital Stay (HOSPITAL_COMMUNITY): Payer: Medicare Other | Admitting: Occupational Therapy

## 2018-10-08 ENCOUNTER — Inpatient Hospital Stay (HOSPITAL_COMMUNITY): Payer: Medicare Other

## 2018-10-08 ENCOUNTER — Inpatient Hospital Stay (HOSPITAL_COMMUNITY): Payer: Medicare Other | Admitting: Physical Therapy

## 2018-10-08 ENCOUNTER — Encounter (HOSPITAL_COMMUNITY): Payer: Medicare Other | Admitting: Psychology

## 2018-10-08 DIAGNOSIS — I959 Hypotension, unspecified: Secondary | ICD-10-CM

## 2018-10-08 LAB — GLUCOSE, CAPILLARY
Glucose-Capillary: 99 mg/dL (ref 70–99)
Glucose-Capillary: 110 mg/dL — ABNORMAL HIGH (ref 70–99)
Glucose-Capillary: 136 mg/dL — ABNORMAL HIGH (ref 70–99)

## 2018-10-08 MED ORDER — DOCUSATE SODIUM 100 MG PO CAPS
100.0000 mg | ORAL_CAPSULE | Freq: Two times a day (BID) | ORAL | Status: DC
  Administered 2018-10-08 – 2018-10-11 (×6): 100 mg via ORAL
  Filled 2018-10-08 (×6): qty 1

## 2018-10-08 NOTE — Consult Note (Signed)
Neuropsychological Consultation   Patient:   Erik Morales   DOB:   24-Jul-1941  MR Number:  [id number]  Location:  [facility] [address] [facility] [address] [place] [id number] [place] [address] Dept: [place]: [phone]           Date of Service:   10/08/2018  Start Time:   2 PM End Time:   3 PM  Provider/Observer:  Ilean Skill, Psy.D.       Clinical Neuropsychologist       Billing Code/Service: [phone] 4 Units  Chief Complaint:    [name]. Rushing is a 78 year old male with a history of type 2 diabetes, hypertension, tobacco abuse.  The patient was admitted on 09/25/2018 after being found [place] the ditch by his wife with mild cognitive deficits.  It was unknown as to how long he was down before being found.  The patient was noted to have elevated troponin at 15.7, acute renal failure, with elevated white blood count and hemoglobin of 8.2.  Chest x-ray did show question of pneumonia and antibiotic coverage was broadened.  The patient was transferred to [facility] for higher level of care.  The patient has developed debility as 1 of the primary issues for following up with comprehensive inpatient rehabilitation following improvement in his overall medical and cognitive status.  The patient's mental status has improved significantly and he has been working on increasing his strength and ability warm ADLs which were being severely impacted due to debility.  Reason for Service:  The patient was referred for neuropsychological consultation to address issues of coping and adjusting and form screening for potential cognitive deficits.  Below is the HPI for the current admission.  [name] C [name] is a 78 year old male with history of T2DM, HTN, tobacco abuse who was admitted on 02/06/20after being found in the ditch by wife with mild cognitive deficits--unknown period of time down. He was noted to have elevated troponin at  15.7, acute renal failure,leukocytosis with WBC 24,800 and hemoglobin 8.2. He was started on fluids due to lactic acidosis and treated for urosepsis due to E. coli UTI. Chest x-ray did show question of pneumonia therefore antibiotic coverage broadened. Cardiology felt elevated troponin due to demand ischemia but he did develop PAF/flutter treated with Cardizem and amiodarone for rate control with addition ofEliquis for anticoagulation. He developed GI bleed with bloody stool drop in hemoglobin to 5.7 as well as hypotension with confusion. He was transferred to [facility] for higher level of care. He was treated with multiple units packed red blood cells and GI consulted for input. Dr Dannisrecommended direct reversal of oral anticoagulation as well as Protonix drip and every 6 hours CBC for monitoring.  Cardiology/Dr. Margaretann Loveless consulted for input and felt that recurrent rise in troponin's due to NSTEMI andpatient was not a candidate for invasive ischemic work-up---to follow-up with cardiology in a month. He underwent EGD on 2/8revealing widely patent Schatzki's ring at the GE junction, large hiatal hernia, 1 nonbleeding cratered duodenal ulcer with visible vessel that was injected with epinephrine.He recommended full liquid diet and check a hematocrit x3 as well as avoidingaspirin antiplatelet agents or anticoagulation for now. Melena resolving and he was advanced to regular diet on 2/9 with recommendations to continue PPI bid for 4 weeks then daily and to "avoid all NSAIDs moving forward". Serial H/H stable and patient cleared to start anticoagulation ion 2/25. Patient with QT prolongation and  pharmacy recommends following closely for interaction. He completes antibiotic regimen today. Therapy ongoing and patient limited by BLE weakness and debility. CIR recommended for follow up therapy.  Current Status:  While the patient was somewhat lethargic he was oriented during the 1 hour  session I spent with the patient.  The patient was well aware of his increasing weakness and difficulty managing which was further significantly impaired due to his long hospitalization.  The patient reports that his mood and coping have been improving as he is seen increased strength in his legs and is remaining motivated to work hard although he does say that this is very difficult for him but he is motivated to be able to return home and continue physical activity once he is there.  Behavioral Observation: [name]  presents as a 78 y.o.-year-old Right Caucasian Male who appeared his stated age. his dress was Appropriate and he was Well Groomed and his manners were Appropriate to the situation.  his participation was indicative of Appropriate and Redirectable behaviors.  There were any physical disabilities noted.  he displayed an appropriate level of cooperation and motivation.     Interactions:    Active Appropriate and Redirectable  Attention:   abnormal and attention span appeared shorter than expected for age  Memory:   abnormal; remote memory intact, recent memory impaired  Visuo-spatial:  not examined  Speech (Volume):  low  Speech:   normal; normal  Thought Process:  Coherent and Relevant  Though Content:  WNL; not suicidal and not homicidal  Orientation:   person, place, time/date and situation  Judgment:   Fair  Planning:   Fair  Affect:    Flat and Lethargic  Mood:    Dysphoric  Insight:   Fair  Intelligence:   normal  Medical History:   Past Medical History:  Diagnosis Date  . DM2 (diabetes mellitus, type 2) ([place])   . HLD (hyperlipidemia)   . HTN (hypertension)    Psychiatric History:  The patient denies any prior psychiatric history and denies any development of symptoms of depression or anxiety symptoms during hospital course.  Family Med/Psych History:  Family History  Problem Relation Age of Onset  . Diabetes Son     Risk of  Suicide/Violence: low the patient denies any suicidal or homicidal ideation.  Impression/DX:  [name]. [name] is a 78 year old male with a history of type 2 diabetes, hypertension, tobacco abuse.  The patient was admitted on 09/25/2018 after being found in the ditch by his wife with mild cognitive deficits.  It was unknown as to how long he was down before being found.  The patient was noted to have elevated troponin at 15.7, acute renal failure, with elevated white blood count and hemoglobin of 8.2.  Chest x-ray did show question of pneumonia and antibiotic coverage was broadened.  The patient was transferred to [facility] for higher level of care.  The patient has developed debility as 1 of the primary issues for following up with comprehensive inpatient rehabilitation following improvement in his overall medical and cognitive status.  The patient's mental status has improved significantly and he has been working on increasing his strength and ability warm ADLs which were being severely impacted due to debility.  While the patient was somewhat lethargic he was oriented during the 1 hour session I spent with the patient.  The patient was well aware of his increasing weakness and difficulty managing which was further significantly impaired due to his  long hospitalization.  The patient reports that his mood and coping have been improving as he is seen increased strength in his legs and is remaining motivated to work hard although he does say that this is very difficult for him but he is motivated to be able to return home and continue physical activity once he is there.   Disposition/Plan:  Today with primary focus on coping and adjustment issues with long hospital stay and the need that he will have to continue working on strength and stamina once he has completed the inpatient rehabilitation stay.  The patient reports that he is motivated to continue to work on his leg strength and ability to continue  to perform ADLs as well as become more active again around his house.  While formal cognitive testing was not performed today the patient was oriented and able to have a generally focused and involved interaction throughout the hour we spent together.          Electronically Signed   _______________________ Ilean Skill, Psy.D.

## 2018-10-08 NOTE — Progress Notes (Signed)
[facility] PHYSICAL MEDICINE & REHABILITATION PROGRESS NOTE  Subjective/Complaints: Patient seen working with therapy this morning.  He states he slept well overnight.  He denies complaints this morning.  ROS: Denies CP, SOB, N/V/D  Objective: Vital Signs: Blood pressure (!) 86/59, pulse 65, temperature 98.2 F (36.8 C), temperature source Oral, resp. rate 16, height 5\' 9"  (1.753 m), weight 95.9 kg, SpO2 94 %. No results found. Recent Labs    10/06/18 0740  WBC 9.6  HGB 9.4*  HCT 30.4*  PLT 287   Recent Labs    10/06/18 0740  NA 138  K 3.7  CL 98  CO2 29  GLUCOSE 121*  BUN 26*  CREATININE 1.74*  CALCIUM 8.3*    Physical Exam: BP (!) 86/59 (BP Location: Right Arm)   Pulse 65   Temp 98.2 F (36.8 C) (Oral)   Resp 16   Ht 5\' 9"  (1.753 m)   Wt 95.9 kg   SpO2 94%   BMI 31.22 kg/m  Constitutional: No distress . Vital signs reviewed. HENT: Normocephalic.  Atraumatic. Eyes: EOMI. No discharge. Cardiovascular: RRR.  No JVD. Respiratory: CTA bilaterally.  Normal effort. GI: BS +. Non-distended. Musc: No edema or tenderness in extremities Neurological: He is alert and oriented Motor: Grossly 4+/5 throughout, unchanged Skin: Warm and dry.  Intact. Psych: Flat  Assessment/Plan: 1. Functional deficits secondary to debility which require 3+ hours per day of interdisciplinary therapy in a comprehensive inpatient rehab setting.  Physiatrist is providing close team supervision and 24 hour management of active medical problems listed below.  Physiatrist and rehab team continue to assess barriers to discharge/monitor patient progress toward functional and medical goals  Care Tool:  Bathing  Bathing activity did not occur: Refused Body parts bathed by patient: Right arm, Left arm, Chest, Abdomen, Face, Front perineal area, Buttocks, Right upper leg, Left upper leg, Right lower leg, Left lower leg   Body parts bathed by helper: Right lower leg, Left lower leg,  Buttocks     Bathing assist Assist Level: Contact Guard/Touching assist     Upper Body Dressing/Undressing Upper body dressing   What is the patient wearing?: Pull over shirt    Upper body assist Assist Level: Minimal Assistance - Patient > 75%    Lower Body Dressing/Undressing Lower body dressing      What is the patient wearing?: Pants, Incontinence brief     Lower body assist Assist for lower body dressing: Minimal Assistance - Patient > 75%     Toileting Toileting    Toileting assist Assist for toileting: Contact Guard/Touching assist     Transfers Chair/bed transfer  Transfers assist     Chair/bed transfer assist level: Contact Guard/Touching assist     Locomotion Ambulation   Ambulation assist   Ambulation activity did not occur: Safety/medical concerns(could not walk without device)  Assist level: Contact Guard/Touching assist Assistive device: Walker-rolling Max distance: 200'   Walk 10 feet activity   Assist  Walk 10 feet activity did not occur: Safety/medical concerns  Assist level: Contact Guard/Touching assist Assistive device: Walker-rolling   Walk 50 feet activity   Assist Walk 50 feet with 2 turns activity did not occur: Safety/medical concerns  Assist level: Contact Guard/Touching assist Assistive device: Walker-rolling    Walk 150 feet activity   Assist Walk 150 feet activity did not occur: Safety/medical concerns  Assist level: Contact Guard/Touching assist Assistive device: Walker-rolling    Walk 10 feet on uneven surface  activity   Assist  Assist level: Moderate Assistance - Patient - 50 - 74% Assistive device: Aeronautical engineer Will patient use wheelchair at discharge?: No Type of Wheelchair: Manual    Wheelchair assist level: Supervision/Verbal cueing Max wheelchair distance: 100'    Wheelchair 50 feet with 2 turns activity    Assist        Assist Level:  Supervision/Verbal cueing   Wheelchair 150 feet activity     Assist Wheelchair 150 feet activity did not occur: Safety/medical concerns          Medical Problem List and Plan: 1.  Debility  secondary to PNA, NSTEMI, GI bleed requiring transfusion  Continue CIR  Team conference today to discuss current and goals and coordination of care, home and environmental barriers, and discharge planning with nursing, case manager, and therapies.  2.  DVT Prophylaxis/Anticoagulation: Mechanical: Sequential compression devices, below knee Bilateral lower extremities 3. Pain Management: tylenol prn  4. Mood: LCSW to follow for evaluation and support.  5. Neuropsych: This patient is ?fully capable of making decisions on his own behalf. 6. MASD/Skin/Wound Care: Routine pressure relief measures.  7. Fluids/Electrolytes/Nutrition: Monitor I/Os.  8. E coli Urosepsis/ CAP: Completed 7 day course of antibiotic regimen on 2/13.   Recent chest x-ray reviewed with patient, unremarkable for acute process  Incentive spiro  9. GIB with melena: Continue to monitor for signs of bleeding and serial H/H. Continue Protonix BID for 4 wks then daily.   Iron studies suggesting iron deficiency anemia  Hemoglobin 9.4 on 2/17  Continue to monitor 10. A fib/A flutter: Monitor HR bid. Continue amiodarone bid.  11. Chronic diastolic CHF: Lasix resumed 2/11, decreased on 2/17  Monitor weights daily and for other signs of overload.  Filed Weights   10/07/18 0601 10/07/18 0605 10/08/18 0542  Weight: 96.1 kg 96.1 kg 95.9 kg   Stable on 2/19 12. T2DM: Monitor BS ac/hs. Continue SSI for now.     Resumed Amaryl 1 mg daily on 2/16, consider further increase if necessary  Relatively controlled on 2/19 13. Leucocytosis: Resolved  Monitor for signs of infection.   WBCs 9.6 on 2/17  Afebrile 14.  Hypoalbuminemia  Supplement initiated on 2/14 15.  CKD   Creatinine 1.74 on 2/17  Encourage fluids  Cont to monitor  16.   Hypotension  Blood pressure relatively low, however asymptomatic, not on any blood pressure medications  Continue to monitor  LOS: 6 days A FACE TO FACE EVALUATION WAS PERFORMED  Tahiry Spicer Lorie Phenix 10/08/2018, 11:40 AM

## 2018-10-08 NOTE — Plan of Care (Signed)
  Problem: Consults Goal: RH GENERAL PATIENT EDUCATION Description See Patient Education module for education specifics. Outcome: Progressing   Problem: RH BOWEL ELIMINATION Goal: RH STG MANAGE BOWEL WITH ASSISTANCE Description STG Manage Bowel with Mod Assistance.  Outcome: Progressing   Problem: RH BLADDER ELIMINATION Goal: RH STG MANAGE BLADDER WITH ASSISTANCE Description STG Manage Bladder With Mod Assistance  Outcome: Progressing   Problem: RH SKIN INTEGRITY Goal: RH STG SKIN FREE OF INFECTION/BREAKDOWN Description Pt will remain free of breakdown with min assist    Outcome: Progressing   Problem: RH SAFETY Goal: RH STG ADHERE TO SAFETY PRECAUTIONS W/ASSISTANCE/DEVICE Description STG Adhere to Safety Precautions With Min Assistance/Device.  Outcome: Progressing   Problem: RH PAIN MANAGEMENT Goal: RH STG PAIN MANAGED AT OR BELOW PT'S PAIN GOAL Description <3 out of 10.   Outcome: Progressing

## 2018-10-08 NOTE — Progress Notes (Signed)
Occupational Therapy Session Note  Patient Details  Name: Erik Morales MRN: [id number] Date of Birth: 31-Dec-1940  Today's Date: 10/08/2018 OT Individual Time: 0925-1030 OT Individual Time Calculation (min): 65 min    Short Term Goals: Week 1:  OT Short Term Goal 1 (Week 1): Pt will don pants with min A OT Short Term Goal 2 (Week 1): Pt will complete shower transfer with CGA OT Short Term Goal 3 (Week 1): Pt will demonstrate improved activity tolerance by completing standing level grooming task with no rest break   Skilled Therapeutic Interventions/Progress Updates:    Pt seen for OT session focusing on functional mobility, balance, and activity tolerance. Pt sitting up in w/c upon arrival, denying pain and agreeable to tx session.  With encouragement, pt willing to participate in timed toileting and attempt void. Stood with supervision at [facility] to use hand held urinal. Pt began urinating prior to getting urinal in place. Discussed urgency, timed toileting, toileting position for d/c as pt now on lassix.  He ambulated throughout session and unit with close supervision using RW.  In therapy gym, completed x2 sets of 5 sit>stand from EOM without UE support. Mat lowered on subsequent trial. Completed with supervision and rest break btwn trials.  Pt's granddaughter arrived, reports pt needing to practice step up onto ledge as house at d/c has sunken living room. Completed step up/down onto 4" step, pt appropraitely managing RW with supervision. Completed x4 trials with seated rest break btwn trials.  Granddaughter reports she will be assisting with transportation and wanted to see pt transfer into care. Completed simulated car transfer, car raised  To ~3 ft as pt going home in large pick-up truck. Able to complete transfer with guarding assist.  Pt returned to room at end of session, left seated in recliner with family member present and all needs in reach.   Therapy Documentation Precautions:   Precautions Precautions: Fall Restrictions Weight Bearing Restrictions: No Pain:   No/denies pain   Therapy/Group: Individual Therapy  Kandy Towery L 10/08/2018, 7:06 AM

## 2018-10-08 NOTE — Patient Care Conference (Signed)
Inpatient RehabilitationTeam Conference and Plan of Care Update Date: 10/08/2018   Time: 2:20 PM    Patient Name: Erik Morales      Medical Record Number: [id number]  Date of Birth: [birth year] Sex: Male         Room/Bed: 4M10C/4M10C-01 Payor Info: Payor: Marine scientist / Plan: [employer] MEDICARE / Product Type: *No Product type* /    Admitting Diagnosis: debility  Admit Date/Time:  10/02/2018  4:51 PM Admission Comments: No comment available   Primary Diagnosis:  <principal problem not specified> Principal Problem: <principal problem not specified>  Patient Active Problem List   Diagnosis Date Noted  . Hypotension   . Labile blood glucose   . Chronic kidney disease (CKD), stage III (moderate) (HCC)   . Hypoalbuminemia due to protein-calorie malnutrition ([place])   . Diabetes mellitus type 2 in obese ([place])   . History of GI bleed   . Chronic diastolic congestive heart failure ([place])   . Debility 10/02/2018  . Tobacco abuse   . Leukocytosis   . Duodenal ulcer with hemorrhage   . Melena   . Acute blood loss anemia   . Pressure injury of skin 09/26/2018  . Hematochezia 09/25/2018  . AKI (acute kidney injury) ([place]) 09/25/2018  . Severe sepsis ([place]) 09/25/2018  . Atrial fibrillation with RVR ([place]) 09/25/2018  . Elevated troponin 09/25/2018  . Abnormal thyroid function test 09/25/2018  . DM2 (diabetes mellitus, type 2) ([place]) 09/25/2018  . HTN (hypertension) 09/25/2018  . E-coli UTI 09/25/2018  . CAP (community acquired pneumonia) 09/25/2018    Expected Discharge Date: Expected Discharge Date: 10/11/18  Team Members Present: Physician leading conference: Dr. Delice Lesch Social Worker Present: Ovidio Kin, LCSW Nurse Present: Rayetta Pigg, RN PT Present: Georjean Mode, PT SLP Present: Weston Anna, SLP PPS Coordinator present : Gunnar Fusi     Current Status/Progress Goal Weekly Team Focus  Medical   Debility  secondary to PNA, NSTEMI, GI bleed requiring  transfusion  Improve mobility, transfers, endurance, CKD, BP, DM, ABLA  See above   Bowel/Bladder   Continent of bowel ad bladder, LBM  10/07/18, has a prn Laxative ordered  Maintain contience  Address toileting needs QS and prn, provide prn's    Swallow/Nutrition/ Hydration             ADL's   Supervision-occasional min A overall  Mod I overall   Functional activity tolerance, ADL/IADL re-training, d/c planning   Mobility   CGA basic transfers, min assist gait x 200' with RW  I transfers and gait x 50', S up/down 4 steps   activity tolerance, gait training, balance, pt education   Communication             Safety/Cognition/ Behavioral Observations            Pain   Denies Pain  < 2  Assess pain qs and prn, provided medication, and alteratives measures as needed   Skin   Ecchymotic areas to arms, adomen wrist resolving with clinical concerns  Maintain skin integrity   QS assessment and prn, monitor for changes and address concerns with medical team      *See Care Plan and progress notes for long and short-term goals.     Barriers to Discharge  Current Status/Progress Possible Resolutions Date Resolved   Physician    Medical stability     See above  Therapies, follow labs, encourage fluids, optimize DM meds      Nursing  PT                    OT                  SLP                SW                Discharge Planning/Teaching Needs:  Going to granddaughters' home for one week then back to his home with his wife. Granddaughter's have been here daily and ask many questions      Team Discussion:  Goals mod/i level, progressing toward these goals. Currently min-supervision level. Diabetes stabilizing, still encourage fluids. Loses balance at times needs to go slower. Infections resolved. Hemoglobin is stable. Activity tolerance getting better.  Revisions to Treatment Plan:  DC 2/22    Continued Need for Acute Rehabilitation Level of Care: The patient  requires daily medical management by a physician with specialized training in physical medicine and rehabilitation for the following conditions: Daily direction of a multidisciplinary physical rehabilitation program to ensure safe treatment while eliciting the highest outcome that is of practical value to the patient.: Yes Daily medical management of patient stability for increased activity during participation in an intensive rehabilitation regime.: Yes Daily analysis of laboratory values and/or radiology reports with any subsequent need for medication adjustment of medical intervention for : Cardiac problems;Pulmonary problems;Diabetes problems;Blood pressure problems;Renal problems   I attest that I was present, lead the team conference, and concur with the assessment and plan of the team.   Elease Hashimoto 10/08/2018, 3:46 PM

## 2018-10-08 NOTE — Progress Notes (Signed)
Occupational Therapy Session Note  Patient Details  Name: Erik Morales MRN: [id number] Date of Birth: [birth year]  Today's Date: 10/08/2018 OT Individual Time: 7943-2761 OT Individual Time Calculation (min): 60 min    Short Term Goals: Week 1:  OT Short Term Goal 1 (Week 1): Pt will don pants with min A OT Short Term Goal 2 (Week 1): Pt will complete shower transfer with CGA OT Short Term Goal 3 (Week 1): Pt will demonstrate improved activity tolerance by completing standing level grooming task with no rest break   Skilled Therapeutic Interventions/Progress Updates:    Pt received sitting up in bed eating breakfast, requesting OT to return once finished. Assisted pt in removing condom catheter with RN present. Pt completed bed mobility with no physical A or cueing but use of bed features. Pt used RW with 1 cue for UE placement and completed functional mobility to sink. Pt stood and completed UB bathing with close (S). CGA provided during standing level peri cleansing. Pt completed oral care seated with set up. Pt used RW to complete 250 ft of functional mobility to therapy gym. Extended rest break provided once seated. Pt was passively stretched posteriorly over therapy ball to correct kyphotic posture, with moderate cueing for head positioning provided throughout. Pt completed standing level pipe tree puzzle with no UE support on RW to challenge standing balance. No LOB throughout. Pt returned to room and was left sitting up in w/c with chair alarm belt donned and all needs met.   Therapy Documentation Precautions:  Precautions Precautions: Fall Restrictions Weight Bearing Restrictions: No  Pain:   No pain reported during session  Therapy/Group: Individual Therapy  Curtis Sites 10/08/2018, 7:22 AM

## 2018-10-08 NOTE — Progress Notes (Signed)
Physical Therapy Session Note  Patient Details  Name: Erik Morales MRN: [id number] Date of Birth: [birth year]  Today's Date: 10/08/2018 PT Individual Time: 1545-1700 PT Individual Time Calculation (min): 75 min   Short Term Goals: Week 1:  PT Short Term Goal 1 (Week 1): STG=LTG due to ELOS  Skilled Therapeutic Interventions/Progress Updates:   Pt received sitting in WC and agreeable to PT. Gait training with RW x 165f, 1595f 12062fupervision assist -CGA for clothing management. PT instructed pt in Berg balance test. Patient demonstrates increased fall risk as noted by score of   28/56 on Berg Balance Scale.  (<36= high risk for falls, close to 100%; 37-45 significant >80%; 46-51 moderate >50%; 52-55 lower >25%) Nustep 4 min + 3 min with prolonged rest break, min cues for pursed lip breathing with SOB. WC mobility x [id number] UE endurance training and supervision assist from PT, with min cues for equal use of BUE and obstacle navigation. Patient returned to room and [facility] and all needs met.         Therapy Documentation Precautions:  Precautions Precautions: Fall Restrictions Weight Bearing Restrictions: No    Vital Signs: Therapy Vitals Temp: 97.8 F (36.6 C) Pulse Rate: 81 Resp: (!) 22 BP: (!) 155/79 Patient Position (if appropriate): Sitting Oxygen Therapy SpO2: 100 % O2 Device: Room Air Pain:   denis Mobility:Balance: Balance Balance Assessed: Yes Standardized Balance Assessment Standardized Balance Assessment: Berg Balance Test Berg Balance Test Sit to Stand: Able to stand  independently using hands Standing Unsupported: Able to stand 2 minutes with supervision Sitting with Back Unsupported but Feet Supported on Floor or Stool: Able to sit safely and securely 2 minutes Stand to Sit: Uses backs of legs against chair to control descent Transfers: Needs one person to assist Standing Unsupported with Eyes Closed: Able to stand  10 seconds with supervision Standing Ubsupported with Feet Together: Able to place feet together independently and stand for 1 minute with supervision From Standing, Reach Forward with Outstretched Arm: Reaches forward but needs supervision From Standing Position, Pick up Object from Floor: Able to pick up shoe, needs supervision From Standing Position, Turn to Look Behind Over each Shoulder: Turn sideways only but maintains balance Turn 360 Degrees: Needs close supervision or verbal cueing Standing Unsupported, Alternately Place Feet on Step/Stool: Able to complete >2 steps/needs minimal assist Standing Unsupported, One Foot in Front: Loses balance while stepping or standing Standing on One Leg: Tries to lift leg/unable to hold 3 seconds but remains standing independently Total Score: 28    Therapy/Group: Individual Therapy  AustLorie Phenix9/2020, 5:14 PM

## 2018-10-08 NOTE — Progress Notes (Signed)
Resting at interval throughout shift without acute distress or discomfort, reoriented as needed, Respiration unlabored, no c/o

## 2018-10-09 ENCOUNTER — Inpatient Hospital Stay (HOSPITAL_COMMUNITY): Payer: Medicare Other

## 2018-10-09 ENCOUNTER — Inpatient Hospital Stay (HOSPITAL_COMMUNITY): Payer: Medicare Other | Admitting: Occupational Therapy

## 2018-10-09 LAB — GLUCOSE, CAPILLARY
GLUCOSE-CAPILLARY: 138 mg/dL — AB (ref 70–99)
Glucose-Capillary: 104 mg/dL — ABNORMAL HIGH (ref 70–99)
Glucose-Capillary: 140 mg/dL — ABNORMAL HIGH (ref 70–99)
Glucose-Capillary: 101 mg/dL — ABNORMAL HIGH (ref 70–99)

## 2018-10-09 LAB — CBC
HCT: 32.1 % — ABNORMAL LOW (ref 39.0–52.0)
Hemoglobin: 10.6 g/dL — ABNORMAL LOW (ref 13.0–17.0)
MCH: 30 pg (ref 26.0–34.0)
MCHC: 33 g/dL (ref 30.0–36.0)
MCV: 90.9 fL (ref 80.0–100.0)
Platelets: UNDETERMINED 10*3/uL (ref 150–400)
RBC: 3.53 MIL/uL — ABNORMAL LOW (ref 4.22–5.81)
RDW: 15.1 % (ref 11.5–15.5)
WBC: 8.8 10*3/uL (ref 4.0–10.5)
nRBC: 0.2 % (ref 0.0–0.2)

## 2018-10-09 NOTE — Progress Notes (Addendum)
Physical Therapy Session Note  Patient Details  Name: Erik Morales MRN: [id number] Date of Birth: 05-24-1941  Today's Date: 10/09/2018 PT Individual Time: 0800-0915 PT Individual Time Calculation (min): 75 min   Short Term Goals: Week 1:  PT Short Term Goal 1 (Week 1): STG=LTG due to ELOS  Skilled Therapeutic Interventions/Progress Updates:   Pt resting in bed.  In flat bed, no rails, supine> sit to R with min assist.  Pt reports R shoulder pain, chronic, which limits pushing up with R UE.  Supine> sit to L with Superivison, cues.  Pt reported that he can move his bed to allow sitting up on L side at home .  Sit> stand whit superivsion.  As PT was tightening pt's pants so that they would not fall down, pt had LOB backwards and sat suddenly on bed.  Stand pivot transfer with RW to w/c, CGA.  Simulated car transfer to high truck seat height, min assist using simulated running board; pt unsafe trying to exit from high seat with RW, mod assist.  Pt stated that he has a sedan that can be used at d/c.  PT consulted with [name], [place] to ask family to bring [name].  Balance challenge and sustained stretch bil heel cords, standing with forefeet on wedge with bil UE support x 1 minute, without UE support x 1 min 30 seconds with A-P sway but no LOB.  Pt reported strong stretch bil heel cords.  Seated self stretching 10 x 4 R/L slow ankle pumps.  Reaching activity in sitting, to facilitate trunk shortening/lengthening/rotating for balance reactions.  Pt noted to have increased trunk mobility after activity.  Gait training to return toward room, x 150 with multiple turns, CGA/supervision.    Pt requested using toilet.  Gait in room with RW to toilet, close supervision.  Transfer with supervision, wall rails.  Stool noted to be bloody.  Gait with RW to sink for hand washing with supervision, and to sit in w/c.  Seat belt alarm set and needs at hand.   PT requested that [name], LPN assess stool.      Therapy  Documentation Precautions:  Precautions Precautions: Fall Restrictions Weight Bearing Restrictions: No  Pain: pt denies          Therapy/Group: Individual Therapy  Karliah Kowalchuk 10/09/2018, 5:18 PM

## 2018-10-09 NOTE — Progress Notes (Signed)
Occupational Therapy Discharge Summary  Patient Details  Name: Erik Morales MRN: [id number] Date of Birth: [birth year]   Patient has met 8 of 8 long term goals due to improved activity tolerance, improved balance, postural control and improved coordination.  Patient to discharge at overall Modified Independent level.  Patient's care partner is independent to provide the necessary physical assistance at discharge.  Pt's family members have been present intermittently throughout rehab admission, some family members attending sessions. They voice willing and ableness to provide PRN assist for pt and to assist with IADLs. Pt plans to d/c to granddaughter's home prior to transitioning to living independently.  Pt using RW to achieve mod I level and tub transfer bench for tub/shower transfers. Brief education provided regarding IADL re-training, however, these were not a pt priority while on IPR.    Recommendation:  Patient will benefit from ongoing skilled OT services in home health setting to continue to advance functional skills in the area of BADL, iADL and Reduce care partner burden.  Equipment: Pt's family to private purchase tub transfer bench.  Reasons for discharge: treatment goals met and discharge from hospital  Patient/family agrees with progress made and goals achieved: Yes  OT Discharge Precautions/Restrictions  Precautions Precautions: Fall Restrictions Weight Bearing Restrictions: No Vision Baseline Vision/History: No visual deficits Patient Visual Report: No change from baseline Vision Assessment?: No apparent visual deficits Perception  Perception: Within Functional Limits Praxis Praxis: Intact Cognition Overall Cognitive Status: Within Functional Limits for tasks assessed Orientation Level: Oriented X4 Memory: Appears intact Awareness: Appears intact Problem Solving: Appears intact Safety/Judgment: Impaired Comments: Decreased awareness of deficits/high fall risk  and functional implications Sensation Sensation Light Touch: Appears Intact Coordination Gross Motor Movements are Fluid and Coordinated: No Fine Motor Movements are Fluid and Coordinated: Yes Coordination and Movement Description: Generalized weakness/deconditioning Motor  Motor Motor: Abnormal postural alignment and control Motor - Discharge Observations: extreme forward head and rounded shoulders, postural limitations increasing fall risk Trunk/Postural Assessment  Cervical Assessment Cervical Assessment: Exceptions to WFL(Forward head/downward gaze) Thoracic Assessment Thoracic Assessment: Exceptions to WFL(Rounded shoulders; kyphotic) Lumbar Assessment Lumbar Assessment: Exceptions to WFL(Posterior pelvic tilt) Postural Control Postural Control: Deficits on evaluation Righting Reactions: Decreased; delayed Postural Limitations: Chronic flexed global posture  Balance Balance Balance Assessed: Yes Static Sitting Balance Static Sitting - Balance Support: Feet supported;Feet unsupported Static Sitting - Level of Assistance: 7: Independent Dynamic Sitting Balance Dynamic Sitting - Balance Support: During functional activity;Feet supported Dynamic Sitting - Level of Assistance: 6: Modified independent (Device/Increase time) Static Standing Balance Static Standing - Balance Support: During functional activity;No upper extremity supported Static Standing - Level of Assistance: 6: Modified independent (Device/Increase time) Dynamic Standing Balance Dynamic Standing - Balance Support: During functional activity;Right upper extremity supported;Left upper extremity supported Dynamic Standing - Level of Assistance: 6: Modified independent (Device/Increase time) Dynamic Standing - Comments: Standing to complete LB dressing/toileting tasks Extremity/Trunk Assessment RUE Assessment RUE Assessment: Exceptions to [facility] General Strength Comments: generalized weakness LUE Assessment LUE  Assessment: Exceptions to [facility] General Strength Comments: generalized weakness   Rounds, Amy L 10/09/2018, 4:03 PM

## 2018-10-09 NOTE — Progress Notes (Signed)
PHYSICAL MEDICINE & REHABILITATION PROGRESS NOTE  Subjective/Complaints: Patient seen laying in bed this morning.  He states he slept well overnight.  He denies complaints.  He is more engaged this morning.  ROS: Denies CP, SOB, N/V/D  Objective: Vital Signs: Blood pressure (!) 97/51, pulse 67, temperature 98 F (36.7 C), temperature source Oral, resp. rate 15, height 5\' 9"  (1.753 m), weight 96 kg, SpO2 96 %. No results found. No results for input(s): WBC, HGB, HCT, PLT in the last 72 hours. No results for input(s): NA, K, CL, CO2, GLUCOSE, BUN, CREATININE, CALCIUM in the last 72 hours.  Physical Exam: BP (!) 97/51 (BP Location: Right Arm) Comment: rn notified   Pulse 67   Temp 98 F (36.7 C) (Oral)   Resp 15   Ht 5\' 9"  (1.753 m)   Wt 96 kg   SpO2 96%   BMI 31.25 kg/m  Constitutional: No distress . Vital signs reviewed. HENT: Normocephalic.  Atraumatic. Eyes: EOMI. No discharge. Cardiovascular: RRR.  No JVD. Respiratory: CTA bilaterally.  Normal effort. GI: BS +. Non-distended. Musc: No edema or tenderness in extremities Neurological: He is alert and oriented Motor: Grossly 4+/5 throughout, stable Skin: Warm and dry.  Intact. Psych: Flat, improving  Assessment/Plan: 1. Functional deficits secondary to debility which require 3+ hours per day of interdisciplinary therapy in a comprehensive inpatient rehab setting.  Physiatrist is providing close team supervision and 24 hour management of active medical problems listed below.  Physiatrist and rehab team continue to assess barriers to discharge/monitor patient progress toward functional and medical goals  Care Tool:  Bathing  Bathing activity did not occur: Refused Body parts bathed by patient: Right arm, Left arm, Chest, Abdomen, Face, Front perineal area, Buttocks, Right upper leg, Left upper leg, Right lower leg, Left lower leg   Body parts bathed by helper: Right lower leg, Left lower leg, Buttocks      Bathing assist Assist Level: Contact Guard/Touching assist     Upper Body Dressing/Undressing Upper body dressing   What is the patient wearing?: Pull over shirt    Upper body assist Assist Level: Minimal Assistance - Patient > 75%    Lower Body Dressing/Undressing Lower body dressing      What is the patient wearing?: Pants, Incontinence brief     Lower body assist Assist for lower body dressing: Minimal Assistance - Patient > 75%     Toileting Toileting    Toileting assist Assist for toileting: Contact Guard/Touching assist     Transfers Chair/bed transfer  Transfers assist     Chair/bed transfer assist level: Supervision/Verbal cueing     Locomotion Ambulation   Ambulation assist   Ambulation activity did not occur: Safety/medical concerns(could not walk without device)  Assist level: Contact Guard/Touching assist Assistive device: Walker-rolling Max distance: 180   Walk 10 feet activity   Assist  Walk 10 feet activity did not occur: Safety/medical concerns  Assist level: Supervision/Verbal cueing Assistive device: Walker-rolling   Walk 50 feet activity   Assist Walk 50 feet with 2 turns activity did not occur: Safety/medical concerns  Assist level: Supervision/Verbal cueing Assistive device: Walker-rolling    Walk 150 feet activity   Assist Walk 150 feet activity did not occur: Safety/medical concerns  Assist level: Supervision/Verbal cueing Assistive device: Walker-rolling    Walk 10 feet on uneven surface  activity   Assist     Assist level: Moderate Assistance - Patient - 50 - 74% Assistive device: Walker-rolling  Wheelchair     Assist Will patient use wheelchair at discharge?: No Type of Wheelchair: Manual    Wheelchair assist level: Supervision/Verbal cueing Max wheelchair distance: 120    Wheelchair 50 feet with 2 turns activity    Assist        Assist Level: Supervision/Verbal cueing    Wheelchair 150 feet activity     Assist Wheelchair 150 feet activity did not occur: Safety/medical concerns          Medical Problem List and Plan: 1.  Debility  secondary to PNA, NSTEMI, GI bleed requiring transfusion  Continue CIR 2.  DVT Prophylaxis/Anticoagulation: Mechanical: Sequential compression devices, below knee Bilateral lower extremities 3. Pain Management: tylenol prn  4. Mood: LCSW to follow for evaluation and support.  5. Neuropsych: This patient is ?fully capable of making decisions on his own behalf. 6. MASD/Skin/Wound Care: Routine pressure relief measures.  7. Fluids/Electrolytes/Nutrition: Monitor I/Os.  8. E coli Urosepsis/ CAP: Completed 7 day course of antibiotic regimen on 2/13.   Recent chest x-ray reviewed with patient, unremarkable for acute process  Incentive spiro  9. GIB with melena: Continue to monitor for signs of bleeding and serial H/H. Continue Protonix BID for 4 wks then daily.   Iron studies suggesting iron deficiency anemia  Hemoglobin 9.4 on 2/17  Continue to monitor 10. A fib/A flutter: Monitor HR bid. Continue amiodarone bid.  11. Chronic diastolic CHF: Lasix resumed 2/11, decreased on 2/17  Monitor weights daily and for other signs of overload.  Filed Weights   10/08/18 0542 10/08/18 1716 10/09/18 0613  Weight: 95.9 kg 95.6 kg 96 kg   Stable on 2/20 12. T2DM: Monitor BS ac/hs. Continue SSI for now.     Resumed Amaryl 1 mg daily on 2/16, consider further increase if necessary  Relatively controlled on 2/20 13. Leucocytosis: Resolved  Monitor for signs of infection.   WBCs 9.6 on 2/17  Afebrile 14.  Hypoalbuminemia  Supplement initiated on 2/14 15.  CKD   Creatinine 1.74 on 2/17  Encourage fluids  Labs ordered for tomorrow  Cont to monitor  16.  Blood pressure  Extremely labile, however appears to be symptomatic  Continue to monitor  LOS: 7 days A FACE TO FACE EVALUATION WAS PERFORMED  Euclide Granito Lorie Phenix 10/09/2018,  8:50 AM

## 2018-10-09 NOTE — Progress Notes (Signed)
Social Work Discharge Note  The overall goal for the admission was met for: DC SAT 2/22  Discharge location: Yes-GOING TO [place] TO HIS HOME WITH HIS WIFE  Length of Stay: Yes-10 DAYS  Discharge activity level: Yes-MOD/I [place]  Home/community participation: Yes  Services provided included: MD, RD, PT, OT, RN, CM, TR, Pharmacy, Neuropsych and SW  Financial Services: Private Insurance: [facility]  Follow-up services arranged: Home Health: [facility], DME: [facility] IN 1 and Patient/Family has no preference for HH/DME agencies  Comments (or additional information):GRANDDAUGHTER'S HERE DAILY AND PARTICIPATE IN THERAPIES WITH PT. PT IS VERY DRIVEN AND PLANS TO BE HOME IN A FEW DAYS. DID WELL HERE AND IS READY TO GO HOME  Patient/Family verbalized understanding of follow-up arrangements: Yes  Individual responsible for coordination of the follow-up plan: SELF & JESSICA & SARAH-GRANDDAUGHTER'S  Confirmed correct DME delivered: Elease Hashimoto 10/09/2018    Elease Hashimoto

## 2018-10-09 NOTE — Progress Notes (Signed)
Occupational Therapy Session Note  Patient Details  Name: Erik Morales MRN: [id number] Date of Birth: October 15, 1940  Today's Date: 10/09/2018 OT Individual Time: 6270-3500 OT Individual Time Calculation (min): 30 min    Short Term Goals: Week 1:  OT Short Term Goal 1 (Week 1): Pt will don pants with min A OT Short Term Goal 2 (Week 1): Pt will complete shower transfer with CGA OT Short Term Goal 3 (Week 1): Pt will demonstrate improved activity tolerance by completing standing level grooming task with no rest break   Skilled Therapeutic Interventions/Progress Updates:    Treatment session with focus on dynamic standing balance with LB dressing and functional mobility.  Pt received upright in recliner with nurse tech present, reporting having spilled urinal.  Pt requested to change pants.  Pt able to thread BLE through pant legs with increased time and then able to pull pants over hips with supervision.  Utilized RW for standing balance while alternating UE support.  Ambulated 100' with RW with supervision/cues for upright posture.  Pt demonstrating carryover from AM OT session with improved step width.  Discussed d/c plan with pt reporting ready for next step in his process of recovery.  Pt left upright in recliner with all needs in reach.  Therapy Documentation Precautions:  Precautions Precautions: Fall Restrictions Weight Bearing Restrictions: No Pain:  Pt with no c/o pain   Therapy/Group: Individual Therapy  Simonne Come 10/09/2018, 3:35 PM

## 2018-10-09 NOTE — Progress Notes (Addendum)
Occupational Therapy Session Note  Patient Details  Name: Erik Morales MRN: [id number] Date of Birth: February 09, 1941  Today's Date: 10/09/2018 OT Individual Time: 1000-1115 and 1405-1430 OT Individual Time Calculation (min): 75 min and 25 min   Short Term Goals: Week 1:  OT Short Term Goal 1 (Week 1): Pt will don pants with min A OT Short Term Goal 2 (Week 1): Pt will complete shower transfer with CGA OT Short Term Goal 3 (Week 1): Pt will demonstrate improved activity tolerance by completing standing level grooming task with no rest break   Skilled Therapeutic Interventions/Progress Updates:    Session One: Pt seen for OT ADL bathing/dressing session and session focusing on functional ambulation and balance. Pt sitting up in w/c upon arrival, denying pain and agreeable to tx session. He ambulated throughout session with RW and close supervision.  He ambulated to closet to gather clothing items in prep for shower, reaching low to obtain items while maintaining balance.  He bathed seated on 3-1 BSC in shower with VCs for thoroughness of task and supervision. He returned to recliner to dress, completed UB/LB with distant supervision, standing with RW to pull pants up. Grooming tasks completed standing at sink at supervision-mod I level. Increased time and rest breaks required throughout bathing/dressing routine 2/2 decreased functional activity tolerance.  Pt taken to therapy gym total A in w/c for time and energy conservation.  Completed ball toss activity, standing without UE support to toss ball with CGA for safety. Completed x2 trials ~2 minutes before requiring seated rest break. Third trial completed with pt standing on wedge foam to facilitate heel cord stretch and promote anterior weight shift.  Ambulation trial weaving through cones without AD, min-mod A with significantly decreased walking speed, VCs for wide BOS and erect posture.  Ambulation while stratteling object to facilitate wide BOS,  completed x3 trials with pt able to carryover step width with min cuing. Education provided regarding importance of wide BOS and pt's high fall risk.  He returned to room in w/c total A. Pt transitioned to recliner and left seated with all needs in reach.   Session Two: Pt seen for OT session focusing on LE stretching and balance strategies for fall reduction. Pt sitting up in recliner upon arrival, denying pain and agreeable to tx session.  In previous sessions, pt noted to have tight heel cords, limiting protective/righting reactions and increasing fall risk. Pt provided with this education and session focusing on self-stretching exercises. Pt able to use cloth gait belt to provide self calf stretch from seated position. Completed stretch x4 Erik Morales, holding each stretch for 30 seconds. Also provided education/demonstration on use of wall to provide stretch.  Pt then completed x3 minutes standing on foam wedge to facilitate heel cord stretch, completing with HHA, no AD while on mat. Pt returned to recliner at end of session, left seated with all needs in reach.   Therapy Documentation Precautions:  Precautions Precautions: Fall Restrictions Weight Bearing Restrictions: No Pain:   No/denies pain   Therapy/Group: Individual Therapy  Meghna Hagmann L 10/09/2018, 6:40 AM

## 2018-10-09 NOTE — Progress Notes (Signed)
Physical Therapy Discharge Summary  Patient Details  Name: Erik Morales MRN: [id number] Date of Birth: [birth year]  Today's Date: 10/10/2018 PT Individual Time: 1500-1558 PT Individual Time Calculation (min): 58 min   Pt in w/c and agreeable to therapy, no c/o pain. Ambulated around unit w/ RW, mod/i, in 150-200' bouts. Performed functional mobility as detailed below including stair negotiation, bed mobility, and transfers. Additionally performed car transfer and curb negotiation w/ RW at mod/i level. NuStep 5 min x2 @ level 3 for global strengthening and endurance training. Returned to room and demonstrated bilateral heelcord stretch pt could perform himself w/ his gait belt. Pt returned demonstration correctly and safely. Ended session in w/c, all needs in reach.   Patient has met 8 of 8 long term goals due to improved activity tolerance, improved balance, improved postural control, increased strength and ability to compensate for deficits.  Patient to discharge at an ambulatory level Modified Independent.   Patient's care partner is independent to provide the necessary physical assistance at discharge (granddaughter).   Reasons goals not met: n/a  Recommendation:  Patient will benefit from ongoing skilled PT services in home health setting to continue to advance safe functional mobility, address ongoing impairments in activity tolerance, balance, flexibility,  and minimize fall risk.  Pt is going to his grandaughter's house for approximately 1 week after d/c, then plans to return home.  Pt reports that his wife is healthy physically.  Chart reports that his wife has dementia.     Equipment: RW and transport chair for community mobility   Reasons for discharge: discharge from hospital  Patient/family agrees with progress made and goals achieved: Yes  PT Discharge Precautions/Restrictions Precautions Precautions: Fall Restrictions Weight Bearing Restrictions: No Vital Signs Therapy  Vitals Temp: 98.5 F (36.9 C) Temp Source: Oral Pulse Rate: 79 Resp: 17 BP: 121/64 Patient Position (if appropriate): Sitting Oxygen Therapy SpO2: 100 % O2 Device: Room Air Vision/Perception  Perception Perception: Within Functional Limits Praxis Praxis: Intact  Cognition Overall Cognitive Status: Within Functional Limits for tasks assessed Orientation Level: Oriented X4 Memory: Appears intact Awareness: Appears intact Problem Solving: Appears intact Safety/Judgment: Impaired Comments: Decreased awareness of deficits/high fall risk and functional implications Sensation Sensation Light Touch: Appears Intact Coordination Gross Motor Movements are Fluid and Coordinated: No Fine Motor Movements are Fluid and Coordinated: Yes Coordination and Movement Description: Generalized weakness/deconditioning Motor  Motor Motor: Abnormal postural alignment and control Motor - Discharge Observations: extreme forward head and rounded shoulders, postural limitations increasing fall risk  Mobility Bed Mobility Bed Mobility: Supine to Sit;Rolling Left;Rolling Right;Sit to Supine Rolling Right: Independent Rolling Left: Independent Supine to Sit: Independent Sitting - Scoot to Edge of Bed: Independent Sit to Supine: Independent Transfers Transfers: Sit to Stand;Stand to [employer] Transfers Sit to Stand: Independent with assistive device Stand to Sit: Independent with assistive device Stand Pivot Transfers: Independent with assistive device Transfer (Assistive device): Rolling walker Locomotion  Gait Ambulation: Yes Gait Assistance: Independent with assistive device Gait Distance (Feet): 150 Feet Assistive device: Rolling walker Gait Gait velocity: decreased Stairs / Additional Locomotion Stairs: Yes Stairs Assistance: Independent with assistive device Stair Management Technique: Two rails Number of Stairs: 12 Height of Stairs: 6 Wheelchair Mobility Wheelchair  Mobility: No(pt is primary ambulator, transport chair for community mobility until endurance improves)  Trunk/Postural Assessment  Cervical Assessment Cervical Assessment: Exceptions to WFL(Forward head/downward gaze) Thoracic Assessment Thoracic Assessment: Exceptions to WFL(Rounded shoulders; kyphotic) Lumbar Assessment Lumbar Assessment: Exceptions to WFL(Posterior pelvic tilt) Postural Control  Postural Control: Deficits on evaluation Righting Reactions: Decreased; delayed Postural Limitations: Chronic flexed global posture  Balance Balance Balance Assessed: Yes Static Sitting Balance Static Sitting - Balance Support: Feet supported;Feet unsupported Static Sitting - Level of Assistance: 7: Independent Dynamic Sitting Balance Dynamic Sitting - Balance Support: During functional activity;Feet supported Dynamic Sitting - Level of Assistance: 6: Modified independent (Device/Increase time) Static Standing Balance Static Standing - Balance Support: During functional activity;No upper extremity supported Static Standing - Level of Assistance: 6: Modified independent (Device/Increase time) Dynamic Standing Balance Dynamic Standing - Balance Support: During functional activity;Right upper extremity supported;Left upper extremity supported Dynamic Standing - Level of Assistance: 6: Modified independent (Device/Increase time) Dynamic Standing - Comments: Standing to complete LB dressing/toileting tasks Extremity Assessment  RLE Assessment RLE Assessment: Exceptions to [facility] Passive Range of Motion (PROM) Comments: increased gastroc tightness/stiffness General Strength Comments: globally 4 to 4+/5 LLE Assessment LLE Assessment: Exceptions to [facility] Passive Range of Motion (PROM) Comments: increased gastroc tightness/stiffness General Strength Comments: globally 4 to 4+/5    COOK,CAROLINE 10/09/2018, 5:33 PM   Burnard Bunting, PT, DPT 10/10/2018, 4:01 PM

## 2018-10-10 ENCOUNTER — Inpatient Hospital Stay (HOSPITAL_COMMUNITY): Payer: Medicare Other | Admitting: Occupational Therapy

## 2018-10-10 ENCOUNTER — Inpatient Hospital Stay (HOSPITAL_COMMUNITY): Payer: Medicare Other | Admitting: Physical Therapy

## 2018-10-10 ENCOUNTER — Inpatient Hospital Stay (HOSPITAL_COMMUNITY): Payer: Medicare Other

## 2018-10-10 DIAGNOSIS — D62 Acute posthemorrhagic anemia: Secondary | ICD-10-CM

## 2018-10-10 DIAGNOSIS — Z09 Encounter for follow-up examination after completed treatment for conditions other than malignant neoplasm: Secondary | ICD-10-CM

## 2018-10-10 LAB — BASIC METABOLIC PANEL
Anion gap: 9 (ref 5–15)
BUN: 33 mg/dL — ABNORMAL HIGH (ref 8–23)
CO2: 30 mmol/L (ref 22–32)
Calcium: 8.4 mg/dL — ABNORMAL LOW (ref 8.9–10.3)
Chloride: 101 mmol/L (ref 98–111)
Creatinine, Ser: 1.85 mg/dL — ABNORMAL HIGH (ref 0.61–1.24)
GFR calc non Af Amer: 34 mL/min — ABNORMAL LOW (ref >60)
GFR, EST AFRICAN AMERICAN: 39 mL/min — AB (ref >60)
Glucose, Bld: 107 mg/dL — ABNORMAL HIGH (ref 70–99)
Potassium: 3.5 mmol/L (ref 3.5–5.1)
SODIUM: 140 mmol/L (ref 135–145)

## 2018-10-10 LAB — GLUCOSE, CAPILLARY
Glucose-Capillary: 135 mg/dL — ABNORMAL HIGH (ref 70–99)
Glucose-Capillary: 71 mg/dL (ref 70–99)
Glucose-Capillary: 87 mg/dL (ref 70–99)
Glucose-Capillary: 102 mg/dL — ABNORMAL HIGH (ref 70–99)

## 2018-10-10 MED ORDER — LATANOPROST 0.005 % OP SOLN: 1.0000 [drp] | Freq: Every day | OPHTHALMIC | 12 refills | Status: DC

## 2018-10-10 MED ORDER — DOCUSATE SODIUM 100 MG PO CAPS: 100.0000 mg | ORAL_CAPSULE | Freq: Two times a day (BID) | ORAL | 0 refills | Status: DC

## 2018-10-10 MED ORDER — GLIMEPIRIDE 1 MG PO TABS: 1.0000 mg | ORAL_TABLET | Freq: Every day | ORAL | 0 refills | Status: DC

## 2018-10-10 MED ORDER — PANTOPRAZOLE SODIUM 40 MG PO TBEC: 40.0000 mg | DELAYED_RELEASE_TABLET | Freq: Two times a day (BID) | ORAL | 0 refills | Status: DC

## 2018-10-10 MED ORDER — POLYSACCHARIDE IRON COMPLEX 150 MG PO CAPS: 150.0000 mg | ORAL_CAPSULE | Freq: Two times a day (BID) | ORAL | 0 refills | Status: DC

## 2018-10-10 MED ORDER — POLYETHYLENE GLYCOL 3350 17 G PO PACK: 17.0000 g | PACK | Freq: Every day | ORAL | 0 refills | Status: DC | PRN

## 2018-10-10 MED ORDER — AMIODARONE HCL 200 MG PO TABS: 200.0000 mg | ORAL_TABLET | Freq: Two times a day (BID) | ORAL | 0 refills | Status: DC

## 2018-10-10 MED ORDER — MONTELUKAST SODIUM 10 MG PO TABS: 10.0000 mg | ORAL_TABLET | Freq: Every evening | ORAL | 0 refills | Status: DC | PRN

## 2018-10-10 NOTE — Progress Notes (Signed)
Occupational Therapy Session Note  Patient Details  Name: Erik Morales MRN: [id number] Date of Birth: 1940/10/31  Today's Date: 10/10/2018 OT Individual Time: [phone] OT Individual Time Calculation (min): 75 min    Short Term Goals: Week 1:  OT Short Term Goal 1 (Week 1): Pt will don pants with min A OT Short Term Goal 2 (Week 1): Pt will complete shower transfer with CGA OT Short Term Goal 3 (Week 1): Pt will demonstrate improved activity tolerance by completing standing level grooming task with no rest break   Skilled Therapeutic Interventions/Progress Updates:    Pt seen for OT ADL bathing/dressing session. Pt sitting up in bed upon arrival, having just finished breakfast and agreeable to tx session, denying pain.  He ambulated throughout room at distant supervision-mod I level using RW. HE gathered items from closet in prep for showering task, reaching to floor to obtain items and maintaining dynamic standing balance while using B UEs in functional task.  He bathed seated on 3-1 BSC Mod I. VCs and education provided throughout session for techniques to reduce fall risk and energy conservation.  He dressed mod I seated in recliner with increased time and rest breaks required. TED Hose donned for edema management, education provided regarding elevated LES during rest breaks.  He completed grooming tasks standing at sink mod. Seated rest break provided before pt ambulated to therapy gym, VCs for upright posture. In therapy gym, completed ambulation trial with 4" wedge placed in walking path for pt to straddle to facilitate wide BOS, good carryover when ambulating without divider and maintaining wide BOS.  Completed table top peg board activity, standing on foam wedge to facilitate heel cord stretch. Pt requiring significantly increased time and min cuing to correctly replicate peg board pattern, able to self- recognize error, however, difficulty correcting. Pt ambulated back to room at end  of session in same manner as described above.  Pt left seated in recliner at end of session with LEs elevated from edema management and all needs in reach.  Pt made Mod I in room in prep for d/c home at mod I level tomorrow.   Therapy Documentation Precautions:  Precautions Precautions: Fall Restrictions Weight Bearing Restrictions: No Pain:   No/denies pain   Therapy/Group: Individual Therapy  Bion Todorov L 10/10/2018, 6:33 AM

## 2018-10-10 NOTE — Discharge Summary (Signed)
Physician Discharge Summary  Patient ID: Erik Morales MRN: [id number] DOB/AGE: Jun 10, 1941 78 y.o.  Admit date: 10/02/2018 Discharge date: 10/11/2018  Discharge Diagnoses:  Principal Problem:   Debility Active Problems:   Chronic kidney disease (CKD), stage III (moderate) (HCC)   Hypoalbuminemia due to protein-calorie malnutrition (HCC)   Diabetes mellitus type 2 in obese (HCC)   History of GI bleed   Chronic diastolic congestive heart failure (HCC)   Hypotension   Acute on chronic renal failure (HCC)   Atrial fibrillation, currently in sinus rhythm   Discharged Condition: stable   Significant Diagnostic Studies: Dg Chest 2 View  Result Date: 10/03/2018 CLINICAL DATA:  Undergoing antibiotic therapy for sepsis and pneumonia. Bilateral pleural effusions. History of diabetes and congestive heart failure. EXAM: CHEST - 2 VIEW COMPARISON:  Radiographs 09/27/2018 and 09/24/2018. FINDINGS: Mild patient rotation to the right. The heart size and mediastinal contours are stable. There is a possible hiatal hernia based on the frontal examination, although not well seen on the lateral view. The lungs are clear. There is no pleural effusion or pneumothorax. No acute osseous findings are evident. IMPRESSION: No evidence of acute cardiopulmonary process. Electronically Signed   By: Richardean Sale M.D.   On: 10/03/2018 17:45   Dg Chest 2 View  Result Date: 09/27/2018 CLINICAL DATA:  Toxic encephalopathy, pneumonia, hypertension, type II diabetes mellitus EXAM: CHEST - 2 VIEW COMPARISON:  09/24/2018 FINDINGS: Upper normal heart size. Atherosclerotic calcification aorta. Mediastinal contours and pulmonary vascularity normal. Questionable RIGHT basilar infiltrate. Remaining lungs clear. Small pleural effusions blunt the posterior costophrenic angles. No pneumothorax. Endplate spur formation thoracic spine. IMPRESSION: Small BILATERAL pleural effusions and questionable RIGHT lower lobe infiltrate.  Electronically Signed   By: Lavonia Dana M.D.   On: 09/27/2018 18:46   US Renal  Result Date: 09/26/2018 CLINICAL DATA:  Acute renal failure. EXAM: RENAL / URINARY TRACT ULTRASOUND COMPLETE COMPARISON:  Renal ultrasound, 09/22/2018 FINDINGS: Right Kidney: Renal measurements: 9.9 x 5.3 x 6.0 cm = volume: 164 mL. Mildly increased parenchymal echogenicity. Diffuse renal cortical thinning. No masses, stones or hydronephrosis. Left Kidney: Renal measurements: 10.2 x 5.9 x 6.2 cm = volume: 195 mL. Borderline increased parenchymal echogenicity. Diffuse renal cortical thinning. No masses, stones or hydronephrosis. Bladder: Decompressed with a Foley catheter. IMPRESSION: 1. No acute findings.  No hydronephrosis. 2. Bilateral renal cortical thinning. Increased right and borderline increased left renal parenchymal echogenicity. Findings consistent with medical renal disease. Electronically Signed   By: Lajean Manes M.D.   On: 09/26/2018 16:28    Labs:  Basic Metabolic Panel: BMP Latest Ref Rng & Units 10/10/2018 10/06/2018 10/03/2018  Glucose 70 - 99 mg/dL 107(H) 121(H) 133(H)  BUN 8 - 23 mg/dL 33(H) 26(H) 18  Creatinine 0.61 - 1.24 mg/dL 1.85(H) 1.74(H) 1.63(H)  Sodium 135 - 145 mmol/L 140 138 138  Potassium 3.5 - 5.1 mmol/L 3.5 3.7 3.7  Chloride 98 - 111 mmol/L 101 98 99  CO2 22 - 32 mmol/L _0 Calcium 8.9 - 10.3 mg/dL 8.4(L) 8.3(L) 8.4(L)    CBC: CBC Latest Ref Rng & Units 10/09/2018 10/06/2018 10/03/2018  WBC 4.0 - 10.5 K/uL 8.8 9.6 11.3(H)  Hemoglobin 13.0 - 17.0 g/dL 10.6(L) 9.4(L) 9.3(L)  Hematocrit 39.0 - 52.0 % 32.1(L) 30.4(L) 29.8(L)  Platelets 150 - 400 K/uL PLATELET CLUMPS NOTED ON SMEAR, UNABLE TO ESTIMATE 287 274    CBG: Recent Labs  Lab 10/10/18 0649 10/10/18 1137 10/10/18 1709 10/10/18 2116 10/11/18 0705  GLUCAP 135*  71 87 102* 94    Filed Weights   10/08/18 1716 10/09/18 0613 10/10/18 0540  Weight: 95.6 kg 96 kg 92.8 kg    Brief HPI:   [name] is a 78 year old  male with history of T2DM, HTN; who was admitted to outside hospital 09/25/18 after being found in the ditch by his wife with unknown period of time down.  He was noted to have elevated troponin with acute renal failure, leukocytosis as well as anemia.  He was started on IV fluids for lactic acidosis and was treated for issues due to E. coli urosepsis as well as pneumonia.  Hospital course significant for PA flutter treated with anticoagulation however patient developed GI bleed with bloody stools and drop in hemoglobin to 5.7 with hypotension and confusion.  He was transferred to [facility] for higher level of care.  He was treated with multiple units of PRBC and [facility]  consulted for input.  He was  monitored with serial checks and then underwent EGD on 09/27/18 revealing widely patent Schatzki's ring at GE junction, large hiatal hernia as well as 1 nonbleeding cratered duodenal ulcer with visible vessel that was injected with epinephrine.   He did have a rise in troponins due to NSTEMI and patient not candidate for ischemic work-up at this time.  He is to follow-up with cardiology past discharge for further work-up.  Melena was resolving and GI recommended continue PPI for 4 additional weeks then decrease to daily.  He has been using NSAIDs prior to admission and recommendations are to avoid all NSAIDs moving forward.  He has completed his antibiotic regimen and respiratory status stable. Lasix increased to bid due to fluid overload. Therapy ongoing and patient noted to be limited by weakness and debility.  CIR was recommended due to functional decline   Hospital Course: KEISTON MANLEY was admitted to rehab 10/02/2018 for inpatient therapies to consist of PT, ST and OT at least three hours five days a week. Past admission physiatrist, therapy team and rehab RN have worked together to provide customized collaborative inpatient rehab.  Weights were monitored daily and have been on a steady downward  trend.  Respiratory status has been stable and no signs of overload noted.  Renal status has been monitored with routine checks and he was noted to have acute on chronic renal failure.  Lasix was decreased to once a day without improvement therefore this was discontinued at discharge.  He is to follow-up with cardiology in 1 week for repeat check of be met as well as input for resumption of diuretics.  Follow-up chest x-ray was negative for acute process.  Diabetes has been monitored with a CHF CBG check and Amaryl was resumed at 1 mg p.o. per day.  His p.o. intake has been good and he is continent of bowel and bladder.  Serial CBC shows H&H to be steadily improving and platelets are stable.  He was noted to have some BRBPR and Colace was added with resolution of bleeding.  Anemia panel showed low stores of iron therefore he was started on iron supplements.  He is to continue Protonix twice daily through 3/10 and then decrease to once a day.  Heart rate has been monitored on a twice daily basis and is stable.  He continues in normal sinus rhythm on amiodarone bid.  Family has expressed concerns about initiation of anticoagulation and are to follow up with Dr. Geraldo Pitter for input.  GI has  recommended resuming  anticoagulation if needed on 10/14/18.  His balance and endurance levels have improved and he is currently at modified independent with use of RW. He will continue to receive follow up HHPT and [place] [facility] after discharge.    Rehab course: During patient's stay in rehab weekly team conferences were held to monitor patient's progress, set goals and discuss barriers to discharge. At admission, patient required min assist with basic self care tasks and min assist for mobility.  He  has had improvement in activity tolerance, balance, postural control as well as ability to compensate for deficits.  He is able to complete ADL tasks at modified independent level.  He is modified independent for transfers and is  able to ambulate 150 feet with a rolling walker. Family education was completed regarding all aspects of safety and care. They plan on providing supervision for a week prior to transition to home.   Disposition: Home  Diet: Diabetic/Low salt/Heart Healthy  Special Instructions: 1. Drink plenty of fluids. Continue to monitor weights daily 2. Needs BMET/CBC rechecked in 5-7 days. 3. Discuss anticoagulation with cardiology on follow up appointment 4. Decrease protonix to daily starting 3/11. Needs to avoid NSAIDS indefinitely.   Discharge Instructions    Ambulatory referral to Physical Medicine Rehab   Complete by:  As directed    1-2 weeks transitional care appt     Allergies as of 10/11/2018   No Known Allergies     Medication List    STOP taking these medications   furosemide 20 MG tablet Commonly known as:  LASIX   potassium chloride 10 MEQ tablet Commonly known as:  K-DUR   pravastatin 20 MG tablet Commonly known as:  PRAVACHOL   terazosin 5 MG capsule Commonly known as:  HYTRIN     TAKE these medications   acetaminophen 500 MG tablet Commonly known as:  TYLENOL Take 500 mg by mouth every 6 (six) hours as needed for headache (pain).   amiodarone 200 MG tablet Commonly known as:  PACERONE Take 1 tablet (200 mg total) by mouth 2 (two) times daily. Notes to patient:  For A fib/heart rate control   calcium elemental as carbonate 400 MG chewable tablet Commonly known as:  BARIATRIC TUMS ULTRA Chew 1,000 mg by mouth 3 (three) times daily as needed for heartburn (indigestion).   docusate sodium 100 MG capsule Commonly known as:  COLACE Take 1 capsule (100 mg total) by mouth 2 (two) times daily. Notes to patient:  Avoid hard stools   glimepiride 1 MG tablet Commonly known as:  AMARYL Take 1 tablet (1 mg total) by mouth daily with breakfast. Notes to patient:  For diabetes   iron polysaccharides 150 MG capsule Commonly known as:  NIFEREX Take 1 capsule (150 mg  total) by mouth 2 times daily at 12 noon and 4 pm. Notes to patient:  For anemia   latanoprost 0.005 % ophthalmic solution Commonly known as:  XALATAN Place 1 drop into both eyes at bedtime. Notes to patient:  For glaucoma   montelukast 10 MG tablet Commonly known as:  SINGULAIR Take 1 tablet (10 mg total) by mouth at bedtime as needed (allergies).   multivitamin with minerals Tabs tablet Take 1 tablet by mouth daily. Centrum Silver   pantoprazole 40 MG tablet Commonly known as:  PROTONIX Take 1 tablet (40 mg total) by mouth 2 (two) times daily before a meal. Notes to patient:  To help to heal stomach ulcer  polyethylene glycol packet Commonly known as:  MIRALAX / GLYCOLAX Take 17 g by mouth daily as needed for mild constipation.   Vitamin D (Ergocalciferol) 1.25 MG (50000 UT) Caps capsule Commonly known as:  DRISDOL Take 50,000 Units by mouth every Wednesday. Notes to patient:  For vitamin D deficiency--once a week       Follow-up Information    [name], MD Follow up on 10/15/2018.   Specialty:  Internal Medicine Why:  APPOINTMENT @ 1:00 PM Contact information: [address] [place] [address] [phone]        Revankar, Reita Cliche, MD Follow up on 10/15/2018.   Specialty:  Cardiology Why:  Appointment at 9:40 am. Discuss need/concerns about blood thinners/ Contact information: [address] [place] [address] [phone]        Jamse Arn, MD Follow up.   Specialty:  Physical Medicine and Rehabilitation Why:  Office will call you with follow up appointment Contact information: [address] [place] [address] [phone]           Signed: Bary Leriche 10/13/2018, 2:50 PM

## 2018-10-10 NOTE — Progress Notes (Signed)
PHYSICAL MEDICINE & REHABILITATION PROGRESS NOTE  Subjective/Complaints: Patient seen lying in bed this morning.  He states he slept fairly overnight.  He is upset about the fact that he had a blood draw again this morning.  He is looking forward to discharge tomorrow.  ROS: Denies CP, SOB, N/V/D  Objective: Vital Signs: Blood pressure 114/60, pulse 61, temperature 97.7 F (36.5 C), temperature source Oral, resp. rate 16, height 5\' 9"  (1.753 m), weight 92.8 kg, SpO2 95 %. No results found. Recent Labs    10/09/18 0924  WBC 8.8  HGB 10.6*  HCT 32.1*  PLT PLATELET CLUMPS NOTED ON SMEAR, UNABLE TO ESTIMATE   Recent Labs    10/10/18 0541  NA 140  K 3.5  CL 101  CO2 30  GLUCOSE 107*  BUN 33*  CREATININE 1.85*  CALCIUM 8.4*    Physical Exam: BP 114/60 (BP Location: Left Arm)   Pulse 61   Temp 97.7 F (36.5 C) (Oral)   Resp 16   Ht 5\' 9"  (1.753 m)   Wt 92.8 kg   SpO2 95%   BMI 30.20 kg/m  Constitutional: No distress . Vital signs reviewed. HENT: Normocephalic.  Atraumatic. Eyes: EOMI. No discharge. Cardiovascular: RRR.  No JVD. Respiratory: CTA bilaterally.  Normal effort. GI: BS +. Non-distended. Musc: No edema or tenderness in extremities Neurological: He is alert and oriented Motor: Grossly 4+/5 throughout, unchanged Skin: Warm and dry.  Intact. Psych: Flat  Assessment/Plan: 1. Functional deficits secondary to debility which require 3+ hours per day of interdisciplinary therapy in a comprehensive inpatient rehab setting.  Physiatrist is providing close team supervision and 24 hour management of active medical problems listed below.  Physiatrist and rehab team continue to assess barriers to discharge/monitor patient progress toward functional and medical goals  Care Tool:  Bathing  Bathing activity did not occur: Refused Body parts bathed by patient: Right arm, Left arm, Chest, Abdomen, Face, Front perineal area, Buttocks, Right upper leg,  Left upper leg, Right lower leg, Left lower leg   Body parts bathed by helper: Right lower leg, Left lower leg, Buttocks     Bathing assist Assist Level: Independent     Upper Body Dressing/Undressing Upper body dressing   What is the patient wearing?: Pull over shirt    Upper body assist Assist Level: Independent    Lower Body Dressing/Undressing Lower body dressing      What is the patient wearing?: Pants     Lower body assist Assist for lower body dressing: Independent     Toileting Toileting    Toileting assist Assist for toileting: Independent with assistive device Assistive Device Comment: RW   Transfers Chair/bed transfer  Transfers assist     Chair/bed transfer assist level: Independent with assistive device Chair/bed transfer assistive device: Programmer, multimedia   Ambulation assist   Ambulation activity did not occur: Safety/medical concerns(could not walk without device)  Assist level: Contact Guard/Touching assist Assistive device: Walker-rolling Max distance: 150   Walk 10 feet activity   Assist  Walk 10 feet activity did not occur: Safety/medical concerns  Assist level: Contact Guard/Touching assist Assistive device: Walker-rolling   Walk 50 feet activity   Assist Walk 50 feet with 2 turns activity did not occur: Safety/medical concerns  Assist level: Contact Guard/Touching assist Assistive device: Walker-rolling    Walk 150 feet activity   Assist Walk 150 feet activity did not occur: Safety/medical concerns  Assist level: Contact Guard/Touching assist Assistive  device: Walker-rolling    Walk 10 feet on uneven surface  activity   Assist     Assist level: Moderate Assistance - Patient - 50 - 74% Assistive device: Aeronautical engineer Will patient use wheelchair at discharge?: No Type of Wheelchair: Manual    Wheelchair assist level: Supervision/Verbal cueing Max wheelchair  distance: 120    Wheelchair 50 feet with 2 turns activity    Assist        Assist Level: Supervision/Verbal cueing   Wheelchair 150 feet activity     Assist Wheelchair 150 feet activity did not occur: Safety/medical concerns          Medical Problem List and Plan: 1.  Debility  secondary to PNA, NSTEMI, GI bleed requiring transfusion  Continue CIR  Plan for d/c tomorrow  Will see patient for transitional care management in 1-2 weeks post-discharge 2.  DVT Prophylaxis/Anticoagulation: Mechanical: Sequential compression devices, below knee Bilateral lower extremities 3. Pain Management: tylenol prn  4. Mood: LCSW to follow for evaluation and support.  5. Neuropsych: This patient is ?fully capable of making decisions on his own behalf. 6. MASD/Skin/Wound Care: Routine pressure relief measures.  7. Fluids/Electrolytes/Nutrition: Monitor I/Os.  8. E coli Urosepsis/ CAP: Completed 7 day course of antibiotic regimen on 2/13.   Recent chest x-ray reviewed with patient, unremarkable for acute process  Incentive spiro  9. GIB with melena: Continue to monitor for signs of bleeding and serial H/H. Continue Protonix BID for 4 wks then daily.   Iron studies suggesting iron deficiency anemia  Hemoglobin 10.6 on 2/20, improving  Continue to monitor 10. A fib/A flutter: Monitor HR bid. Continue amiodarone bid.  11. Chronic diastolic CHF: Lasix resumed 2/11, decreased on 2/17  Monitor weights daily and for other signs of overload.  Filed Weights   10/08/18 1716 10/09/18 0613 10/10/18 0540  Weight: 95.6 kg 96 kg 92.8 kg   Stable on 2/21 12. T2DM: Monitor BS ac/hs. Continue SSI for now.     Resumed Amaryl 1 mg daily on 2/16  Relatively controlled, however slightly elevated.  Will require further ambulatory monitoring and adjustments. 13. Leucocytosis: Resolved  Monitor for signs of infection.   Afebrile 14.  Hypoalbuminemia  Supplement initiated on 2/14 15.  CKD   Creatinine  1.85 on 2/21  Encourage fluids, reminded patient again today  Will need ambulatory monitoring and potential adjustments of Lasix if creatinine does not stabilize  Cont to monitor  16.  Blood pressure  Labile, but asymptomatic on 2/21  Continue to monitor  LOS: 8 days A FACE TO FACE EVALUATION WAS PERFORMED  Erik Morales Erik Morales 10/10/2018, 9:22 AM

## 2018-10-10 NOTE — Progress Notes (Signed)
Occupational Therapy Session Note  Patient Details  Name: Erik Morales MRN: [id number] Date of Birth: October 29, 1940  Today's Date: 10/10/2018 OT Individual Time: 1030-1056 OT Individual Time Calculation (min): 26 min    Short Term Goals: Week 1:  OT Short Term Goal 1 (Week 1): Pt will don pants with min A OT Short Term Goal 2 (Week 1): Pt will complete shower transfer with CGA OT Short Term Goal 3 (Week 1): Pt will demonstrate improved activity tolerance by completing standing level grooming task with no rest break   Skilled Therapeutic Interventions/Progress Updates:    1:1. Pt reiceved in w/c. Pt practices regular height toilet transfer with S overall for VC for scooting to front of toilet to use sides to push off of. This was example to show pt importance of putting BSC over toilet at home. Pt verbalized understanding. Pt completes ambualtion with RW with MOD I. Pt completes standing ball toss to rebounder on standard tile, compliant surface and foam wedge with CGA for posterior LOB on wedge. Pt ambulates with RW to pick up cone off of various levels floor, table, and low stool. Exited session with pt seated in reclienr call light in reach and all needs met  Therapy Documentation Precautions:  Precautions Precautions: Fall Restrictions Weight Bearing Restrictions: No  Therapy/Group: Individual Therapy  Tonny Branch 10/10/2018, 11:01 AM

## 2018-10-10 NOTE — Progress Notes (Signed)
Occupational Therapy Session Note  Patient Details  Name: Erik Morales MRN: [id number] Date of Birth: 07-06-1941  Today's Date: 10/10/2018 OT Individual Time: 1330-1400 OT Individual Time Calculation (min): 30 min  and Today's Date: 10/10/2018 OT Missed Time: 30 Minutes Missed Time Reason: Other (comment)(PA reviewing d/c instructions)   Short Term Goals: Week 1:  OT Short Term Goal 1 (Week 1): Pt will don pants with min A OT Short Term Goal 2 (Week 1): Pt will complete shower transfer with CGA OT Short Term Goal 3 (Week 1): Pt will demonstrate improved activity tolerance by completing standing level grooming task with no rest break   Skilled Therapeutic Interventions/Progress Updates:    1;1. Pt received seated in recliner with family present. Pt missed 30 min skilled OT d/t PA reviewing d/c instructions requesting more time. Pt in good spirits excited to go home. Pt with no c/o pain. Pt completes ambulation to/from all tx destinations with RW and MOD I. Pt completes transfer to recliner with MOD I (preffered surface at home). Pt completes wii bowling with min VC for remote use in standing with S in staggered stance for increased balance challenge. Exited session with pt seated in w/c call light in reahc and all needs met  Therapy Documentation Precautions:  Precautions Precautions: Fall Restrictions Weight Bearing Restrictions: No General:   Vital Signs:   Pain: Pain Assessment Pain Scale: 0-10 Pain Score: 0-No pain   Therapy/Group: Individual Therapy  Tonny Branch 10/10/2018, 1:17 PM

## 2018-10-10 NOTE — Discharge Instructions (Signed)
Inpatient Rehab Discharge Instructions  ESPEN BETHEL Discharge date and time:  10/11/18  Activities/Precautions/ Functional Status: Activity: no lifting, driving, or strenuous exercise till cleared by MD Diet: diabetic diet. Heart healthy. Low salt.  Wound Care: none needed   Functional status:  ___ No restrictions     ___ Walk up steps independently ___ 24/7 supervision/assistance   ___ Walk up steps with assistance _X__ Intermittent supervision/assistance  ___ Bathe/dress independently _X__ Walk with walker    ___ Bathe/dress with assistance ___ Walk Independently    __X_ Shower independently ___ Walk with assistance    ___ Shower with assistance _X__ No alcohol     ___ Return to work/school ________   Special Instructions: 1. No NSAIDs (alever, motrin, ibuprofen or other similar medications) indefinitely. Can use tylenol for pain if needed.  2.  Protonix (to heal ulcer) to continue twice a day till 3/10 then decrease to once a day 3. Eliquis recommended by cardiology. Can be started on 2/25 due to Afib. Can discuss with Dr. Geraldo Pitter.  4. Need to drink plenty of fluids to avoid dehydration.    COMMUNITY REFERRALS UPON DISCHARGE:    Home Health:   PT & OT   [place]  Date of last service:10/11/2018  Medical Equipment/Items Center   [phone]    My questions have been answered and I understand these instructions. I will adhere to these goals and the provided educational materials after my discharge from the hospital.  Patient/Caregiver Signature _______________________________ Date __________  Clinician Signature _______________________________________ Date __________  Please bring this form and your medication list with you to all your follow-up doctor's appointments.

## 2018-10-11 LAB — GLUCOSE, CAPILLARY: GLUCOSE-CAPILLARY: 94 mg/dL (ref 70–99)

## 2018-10-11 NOTE — Progress Notes (Signed)
Erik Morales is a 78 y.o. male August 11, 1941 [id number]  Subjective: No new complaints. No new problems. Ready for DC home today as planned.  Objective: Vital signs in last 24 hours: Temp:  [98 F (36.7 C)-98.4 F (36.9 C)] 98 F (36.7 C) (02/22 0514) Pulse Rate:  [59-79] 59 (02/22 0514) Resp:  [14-19] 14 (02/22 0514) BP: (101-103)/(40-71) 103/65 (02/22 0514) SpO2:  [97 %-99 %] 97 % (02/22 0514) Weight:  [96.2 kg] 96.2 kg (02/22 0514) Weight change: 3.439 kg Last BM Date: 10/09/18  Intake/Output from previous day: 02/21 0701 - 02/22 0700 In: 720 [P.O.:720] Out: 300 [Urine:300]  Physical Exam General: No apparent distress   Dtr at [facility] Lungs: Normal effort. Lungs clear to auscultation, no crackles or wheezes. Cardiovascular: Regular rate and rhythm, no edema  Lab Results: BMET    Component Value Date/Time   NA 140 10/10/2018 0541   K 3.5 10/10/2018 0541   CL 101 10/10/2018 0541   CO2 30 10/10/2018 0541   GLUCOSE 107 (H) 10/10/2018 0541   BUN 33 (H) 10/10/2018 0541   CREATININE 1.85 (H) 10/10/2018 0541   CALCIUM 8.4 (L) 10/10/2018 0541   GFRNONAA 34 (L) 10/10/2018 0541   GFRAA 39 (L) 10/10/2018 0541   CBC    Component Value Date/Time   WBC 8.8 10/09/2018 0924   RBC 3.53 (L) 10/09/2018 0924   HGB 10.6 (L) 10/09/2018 0924   HCT 32.1 (L) 10/09/2018 0924   PLT PLATELET CLUMPS NOTED ON SMEAR, UNABLE TO ESTIMATE 10/09/2018 0924   MCV 90.9 10/09/2018 0924   MCH 30.0 10/09/2018 0924   MCHC 33.0 10/09/2018 0924   RDW 15.1 10/09/2018 0924   LYMPHSABS 1.0 10/03/2018 0511   MONOABS 0.9 10/03/2018 0511   EOSABS 0.0 10/03/2018 0511   BASOSABS 0.0 10/03/2018 0511   CBG's (last 3):   Recent Labs    10/10/18 1709 10/10/18 2116 10/11/18 0705  GLUCAP 87 102* 94   LFT's Lab Results  Component Value Date   ALT 22 10/03/2018   AST 19 10/03/2018   ALKPHOS 44 10/03/2018   BILITOT 0.6 10/03/2018    Studies/Results: No results found.  Medications:  I have reviewed  the patient's current medications. Scheduled Medications: . amiodarone  200 mg Oral BID  . docusate sodium  100 mg Oral BID  . feeding supplement (PRO-STAT SUGAR FREE 64)  30 mL Oral BID  . glimepiride  1 mg Oral Q breakfast  . insulin aspart  0-5 Units Subcutaneous QHS  . insulin aspart  0-9 Units Subcutaneous TID WC  . iron polysaccharides  150 mg Oral q12n4p  . 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, MD 10/11/2018, 10:25 AM

## 2018-10-11 NOTE — Progress Notes (Signed)
Pt DC home today, daughter at bedside. Belongings packed. DC summary complete yesterday by Algis Liming, PA. Pt nor daughter had any questions/concerns. Pt denies pain. Pt taken to main lobby for DC.   Erie Noe, LPN

## 2018-10-13 DIAGNOSIS — Z8679 Personal history of other diseases of the circulatory system: Secondary | ICD-10-CM

## 2018-10-13 DIAGNOSIS — N179 Acute kidney failure, unspecified: Secondary | ICD-10-CM

## 2018-10-13 DIAGNOSIS — N189 Chronic kidney disease, unspecified: Secondary | ICD-10-CM

## 2018-10-13 HISTORY — DX: Acute kidney failure, unspecified: N17.9

## 2018-10-13 HISTORY — DX: Chronic kidney disease, unspecified: N18.9

## 2018-10-14 ENCOUNTER — Telehealth: Payer: Self-pay

## 2018-10-14 DIAGNOSIS — I4891 Unspecified atrial fibrillation: Secondary | ICD-10-CM | POA: Diagnosis not present

## 2018-10-14 DIAGNOSIS — N390 Urinary tract infection, site not specified: Secondary | ICD-10-CM | POA: Diagnosis not present

## 2018-10-14 DIAGNOSIS — K449 Diaphragmatic hernia without obstruction or gangrene: Secondary | ICD-10-CM | POA: Diagnosis not present

## 2018-10-14 DIAGNOSIS — E1122 Type 2 diabetes mellitus with diabetic chronic kidney disease: Secondary | ICD-10-CM | POA: Diagnosis not present

## 2018-10-14 DIAGNOSIS — I5032 Chronic diastolic (congestive) heart failure: Secondary | ICD-10-CM | POA: Diagnosis not present

## 2018-10-14 DIAGNOSIS — Z9181 History of falling: Secondary | ICD-10-CM | POA: Diagnosis not present

## 2018-10-14 DIAGNOSIS — K269 Duodenal ulcer, unspecified as acute or chronic, without hemorrhage or perforation: Secondary | ICD-10-CM | POA: Diagnosis not present

## 2018-10-14 DIAGNOSIS — E46 Unspecified protein-calorie malnutrition: Secondary | ICD-10-CM | POA: Diagnosis not present

## 2018-10-14 DIAGNOSIS — E785 Hyperlipidemia, unspecified: Secondary | ICD-10-CM | POA: Diagnosis not present

## 2018-10-14 DIAGNOSIS — I214 Non-ST elevation (NSTEMI) myocardial infarction: Secondary | ICD-10-CM | POA: Diagnosis not present

## 2018-10-14 DIAGNOSIS — Z87891 Personal history of nicotine dependence: Secondary | ICD-10-CM | POA: Diagnosis not present

## 2018-10-14 DIAGNOSIS — B9620 Unspecified Escherichia coli [E. coli] as the cause of diseases classified elsewhere: Secondary | ICD-10-CM | POA: Diagnosis not present

## 2018-10-14 DIAGNOSIS — N183 Chronic kidney disease, stage 3 (moderate): Secondary | ICD-10-CM | POA: Diagnosis not present

## 2018-10-14 DIAGNOSIS — I130 Hypertensive heart and chronic kidney disease with heart failure and stage 1 through stage 4 chronic kidney disease, or unspecified chronic kidney disease: Secondary | ICD-10-CM | POA: Diagnosis not present

## 2018-10-14 DIAGNOSIS — J189 Pneumonia, unspecified organism: Secondary | ICD-10-CM | POA: Diagnosis not present

## 2018-10-14 NOTE — Telephone Encounter (Signed)
Transitional Care call-granddaughter Erik Morales    1. Are you/is patient experiencing any problems since coming home? No Are there any questions regarding any aspect of care? No 2. Are there any questions regarding medications administration/dosing? No Are meds being taken as prescribed? Yes Patient should review meds with caller to confirm 3. Have there been any falls? No 4. Has Home Health been to the house and/or have they contacted you? She has not and not sure if Erik Morales the other granddaughter  If not, have you tried to contact them? Can we help you contact them? I told her if they have not heard anything by Thursday to call back 5. Are bowels and bladder emptying properly? Yes Are there any unexpected incontinence issues? No If applicable, is patient following bowel/bladder programs? 6. Any fevers, problems with breathing, unexpected pain? No 7. Are there any skin problems or new areas of breakdown? No 8. Has the patient/family member arranged specialty MD follow up (ie cardiology/neurology/renal/surgical/etc)? Yes Can we help arrange? 9. Does the patient need any other services or support that we can help arrange? No 10. Are caregivers following through as expected in assisting the patient? Yes 11. Has the patient quit smoking, drinking alcohol, or using drugs as recommended? Yes  Appointment time, arrive time 1:20 for 1:40 appt with Danella Sensing, NP on 10/23/2018 [name]

## 2018-10-14 NOTE — Telephone Encounter (Signed)
Transitional Care call-called Janett Billow and Judson Roch left message about appt and asked to call back to ask questions.    1. Are you/is patient experiencing any problems since coming home? Are there any questions regarding any aspect of care? 2. Are there any questions regarding medications administration/dosing? Are meds being taken as prescribed? Patient should review meds with caller to confirm 3. Have there been any falls? 4. Has Home Health been to the house and/or have they contacted you? If not, have you tried to contact them? Can we help you contact them? 5. Are bowels and bladder emptying properly? Are there any unexpected incontinence issues? If applicable, is patient following bowel/bladder programs? 6. Any fevers, problems with breathing, unexpected pain? 7. Are there any skin problems or new areas of breakdown? 8. Has the patient/family member arranged specialty MD follow up (ie cardiology/neurology/renal/surgical/etc)?  Can we help arrange? 9. Does the patient need any other services or support that we can help arrange? 10. Are caregivers following through as expected in assisting the patient? 11. Has the patient quit smoking, drinking alcohol, or using drugs as recommended?  Appointment time, arrive time 1:20 for 1:40 with Danella Sensing, NP on 10/23/2018 [place]

## 2018-10-15 ENCOUNTER — Ambulatory Visit (INDEPENDENT_AMBULATORY_CARE_PROVIDER_SITE_OTHER): Payer: Medicare Other | Admitting: Cardiology

## 2018-10-15 ENCOUNTER — Encounter: Payer: Self-pay | Admitting: Cardiology

## 2018-10-15 VITALS — BP 116/58 | HR 76 | Ht 69.0 in | Wt 223.0 lb

## 2018-10-15 DIAGNOSIS — E669 Obesity, unspecified: Secondary | ICD-10-CM

## 2018-10-15 DIAGNOSIS — I4891 Unspecified atrial fibrillation: Secondary | ICD-10-CM | POA: Diagnosis not present

## 2018-10-15 DIAGNOSIS — N183 Chronic kidney disease, stage 3 unspecified: Secondary | ICD-10-CM

## 2018-10-15 DIAGNOSIS — E1169 Type 2 diabetes mellitus with other specified complication: Secondary | ICD-10-CM

## 2018-10-15 DIAGNOSIS — D62 Acute posthemorrhagic anemia: Secondary | ICD-10-CM | POA: Diagnosis not present

## 2018-10-15 DIAGNOSIS — R7989 Other specified abnormal findings of blood chemistry: Secondary | ICD-10-CM

## 2018-10-15 DIAGNOSIS — R778 Other specified abnormalities of plasma proteins: Secondary | ICD-10-CM

## 2018-10-15 DIAGNOSIS — I2510 Atherosclerotic heart disease of native coronary artery without angina pectoris: Secondary | ICD-10-CM | POA: Diagnosis not present

## 2018-10-15 DIAGNOSIS — Z09 Encounter for follow-up examination after completed treatment for conditions other than malignant neoplasm: Secondary | ICD-10-CM | POA: Diagnosis not present

## 2018-10-15 DIAGNOSIS — Z8719 Personal history of other diseases of the digestive system: Secondary | ICD-10-CM

## 2018-10-15 DIAGNOSIS — Z79899 Other long term (current) drug therapy: Secondary | ICD-10-CM | POA: Diagnosis not present

## 2018-10-15 NOTE — Progress Notes (Signed)
Cardiology Office Note:    Date:  10/15/2018   ID:  Livingston Diones, DOB [birth year], MRN [id number]  PCP:  [name], MD  Cardiologist:  Jenean Lindau, MD   Referring MD: Aline August, MD    ASSESSMENT:    1. Atrial fibrillation with RVR ([address])   2. Acute blood loss anemia   3. Chronic kidney disease (CKD), stage III (moderate) (HCC)   4. Diabetes mellitus type 2 in obese (HCC)   5. Elevated troponin   6. History of GI bleed    PLAN:    In order of problems listed above:  1. Primary prevention stressed with the patient.  Importance of compliance with diet and medication stressed and he vocalized understanding.  His blood pressure is stable. 2. I reviewed his discharge lab work and it is stable.  Echocardiogram reveals stable left ventricular systolic function. 3. Patient is asymptomatic at this time and is appearing much better than what the paperwork and records from hospital indicate.  He has shown significant improvement in the past several days. 4. I discussed with the patient atrial fibrillation, disease process. Management and therapy including rate and rhythm control, anticoagulation benefits and potential risks were discussed extensively with the patient. Patient had multiple questions which were answered to patient's satisfaction. 5. He will continue amiodarone therapy.  Benefits and potential risks were explained to the patient and he vocalized understanding.  For obvious reasons of massive GI bleed is not on anticoagulation and agree with this. 6. His stools are dark from iron supplementation.  I told him that he will discuss this with his primary care physician he might have a stool check for occult blood in about 2 weeks from now and hemoglobin checked to to see if there is any continued blood loss.  In this case he might need a follow-up with the gastroenterologist. 7. He will be seen in follow-up appointment in a month or earlier if he has any concerns.  His family had  multiple questions which were answered to their satisfaction.  Total time for this evaluation was 45 minutes.   Medication Adjustments/Labs and Tests Ordered: Current medicines are reviewed at length with the patient today.  Concerns regarding medicines are outlined above.  No orders of the defined types were placed in this encounter.  No orders of the defined types were placed in this encounter.    History of Present Illness:    REZNOR FERRANDO is a 78 y.o. male who is being seen today for the evaluation of post hospitalization for sepsis, acute GI bleed, paroxysmal atrial fibrillation and elevated troponin at the request of Aline August, MD.  Patient is a pleasant 78 year old male.  He has past medical history of essential hypertension and dyslipidemia.  He was a smoker in the remote past.  He had a significant fall from his lawnmower and was admitted to [facility].  There he was found to be in acute renal failure sepsis, elevated troponin.  He was initiated on anticoagulation and had what appears to be massive GI bleed.  Anticoagulation was discontinued and he was transferred to [facility].  Subsequently was stabilized there and had a rehab therapy and was discharged home.  I reviewed echocardiogram which revealed normal systolic function.  This was done at [facility].  For the above reasons he was not considered appropriately for a candidate for intervention.  Patient is here for follow-up.  At the time of my evaluation, the patient is  alert awake oriented and in no distress.  Past Medical History:  Diagnosis Date  . DM2 (diabetes mellitus, type 2) ([place])   . HLD (hyperlipidemia)   . HTN (hypertension)     Past Surgical History:  Procedure Laterality Date  . ESOPHAGOGASTRODUODENOSCOPY (EGD) WITH PROPOFOL N/A 09/27/2018   Procedure: ESOPHAGOGASTRODUODENOSCOPY (EGD) WITH PROPOFOL;  Surgeon: Doran Stabler, MD;  Location: [place];  Service: Gastroenterology;   Laterality: N/A;  . HOT HEMOSTASIS N/A 09/27/2018   Procedure: HOT HEMOSTASIS (ARGON PLASMA COAGULATION/BICAP);  Surgeon: Doran Stabler, MD;  Location: [place];  Service: Gastroenterology;  Laterality: N/A;  . SUBMUCOSAL INJECTION  09/27/2018   Procedure: SUBMUCOSAL INJECTION;  Surgeon: Doran Stabler, MD;  Location: [place] [facility];  Service: Gastroenterology;;    Current Medications: Current Meds  Medication Sig  . acetaminophen (TYLENOL) 500 MG tablet Take 500 mg by mouth every 6 (six) hours as needed for headache (pain).  Marland Kitchen amiodarone (PACERONE) 200 MG tablet Take 1 tablet (200 mg total) by mouth 2 (two) times daily.  . calcium elemental as carbonate (BARIATRIC TUMS ULTRA) 400 MG chewable tablet Chew 1,000 mg by mouth 3 (three) times daily as needed for heartburn (indigestion).  Marland Kitchen docusate sodium (COLACE) 100 MG capsule Take 1 capsule (100 mg total) by mouth 2 (two) times daily.  Marland Kitchen glimepiride (AMARYL) 1 MG tablet Take 1 tablet (1 mg total) by mouth daily with breakfast.  . iron polysaccharides (NIFEREX) 150 MG capsule Take 1 capsule (150 mg total) by mouth 2 times daily at 12 noon and 4 pm.  . latanoprost (XALATAN) 0.005 % ophthalmic solution Place 1 drop into both eyes at bedtime.  . montelukast (SINGULAIR) 10 MG tablet Take 1 tablet (10 mg total) by mouth at bedtime as needed (allergies).  . Multiple Vitamin (MULTIVITAMIN WITH MINERALS) TABS tablet Take 1 tablet by mouth daily. Centrum Silver  . pantoprazole (PROTONIX) 40 MG tablet Take 1 tablet (40 mg total) by mouth 2 (two) times daily before a meal.  . polyethylene glycol (MIRALAX / GLYCOLAX) packet Take 17 g by mouth daily as needed for mild constipation.  . Vitamin D, Ergocalciferol, (DRISDOL) 1.25 MG (50000 UT) CAPS capsule Take 50,000 Units by mouth every Wednesday.     Allergies:   Patient has no known allergies.   Social History   Socioeconomic History  . Marital status: Unknown    Spouse name: Not on file  .  Number of children: Not on file  . Years of education: Not on file  . Highest education level: Not on file  Occupational History  . Not on file  Social Needs  . Financial resource strain: Not on file  . Food insecurity:    Worry: Not on file    Inability: Not on file  . Transportation needs:    Medical: Not on file    Non-medical: Not on file  Tobacco Use  . Smoking status: Former Research scientist (life sciences)  . Smokeless tobacco: Never Used  . Tobacco comment: Quit 1985  Substance and Sexual Activity  . Alcohol use: Never    Frequency: Never  . Drug use: Never  . Sexual activity: Not on file  Lifestyle  . Physical activity:    Days per week: Not on file    Minutes per session: Not on file  . Stress: Not on file  Relationships  . Social connections:    Talks on phone: Not on file    Gets together: Not on file  Attends religious service: Not on file    Active member of club or organization: Not on file    Attends meetings of clubs or organizations: Not on file    Relationship status: Not on file  Other Topics Concern  . Not on file  Social History Narrative  . Not on file     Family History: The patient's family history includes Diabetes in his son.  ROS:   Please see the history of present illness.    All other systems reviewed and are negative.  EKGs/Labs/Other Studies Reviewed:    The following studies were reviewed today: I reviewed records from [facility] hospital in [place]: 09/26/2018: TSH 0.430 10/02/2018: Magnesium 1.9 10/03/2018: ALT 22 10/09/2018: Hemoglobin 10.6; Platelets PLATELET CLUMPS NOTED ON SMEAR, UNABLE TO ESTIMATE 10/10/2018: BUN 33; Creatinine, Ser 1.85; Potassium 3.5; Sodium 140  Recent Lipid Panel    Component Value Date/Time   CHOL 72 09/27/2018 0234   TRIG 134 09/27/2018 0234   HDL 19 (L) 09/27/2018 0234   CHOLHDL 3.8 09/27/2018 0234   VLDL 27 09/27/2018 0234   LDLCALC 26 09/27/2018 0234    Physical Exam:    VS:  BP (!)  116/58 (BP Location: Right Arm, Patient Position: Sitting, Cuff Size: Normal)   Pulse 76   Ht 5\' 9"  (1.753 m)   Wt 223 lb (101.2 kg)   SpO2 98%   BMI 32.93 kg/m     Wt Readings from Last 3 Encounters:  10/15/18 223 lb (101.2 kg)  10/11/18 212 lb 1.3 oz (96.2 kg)  09/26/18 246 lb 14.6 oz (112 kg)     GEN: Patient is in no acute distress HEENT: Normal NECK: No JVD; No carotid bruits LYMPHATICS: No lymphadenopathy CARDIAC: S1 S2 regular, 2/6 systolic murmur at the apex. RESPIRATORY:  Clear to auscultation without rales, wheezing or rhonchi  ABDOMEN: Soft, non-tender, non-distended MUSCULOSKELETAL:  No edema; No deformity  SKIN: Warm and dry NEUROLOGIC:  Alert and oriented x 3 PSYCHIATRIC:  Normal affect    Signed, Jenean Lindau, MD  10/15/2018 10:12 AM    [place]

## 2018-10-15 NOTE — Patient Instructions (Signed)
Medication Instructions:  Your physician recommends that you continue on your current medications as directed. Please refer to the Current Medication list given to you today.  If you need a refill on your cardiac medications before your next appointment, please call your pharmacy.   Lab work: None   If you have labs (blood work) drawn today and your tests are completely normal, you will receive your results only by: Marland Kitchen MyChart Message (if you have MyChart) OR . A paper copy in the mail If you have any lab test that is abnormal or we need to change your treatment, we will call you to review the results.  Testing/Procedures: None   Follow-Up: At [facility], you and your health needs are our priority.  As part of our continuing mission to provide you with exceptional heart care, we have created designated Provider Care Teams.  These Care Teams include your primary Cardiologist (physician) and Advanced Practice Providers (APPs -  Physician Assistants and Nurse Practitioners) who all work together to provide you with the care you need, when you need it. You will need a follow up appointment in 1 months.  Please call our office 2 months in advance to schedule this appointment.  You may see Dr. Geraldo Pitter or another member of our [employer] Provider Team in [place]: Jenne Campus, MD . Shirlee More, MD

## 2018-10-17 DIAGNOSIS — E785 Hyperlipidemia, unspecified: Secondary | ICD-10-CM | POA: Diagnosis not present

## 2018-10-17 DIAGNOSIS — I4891 Unspecified atrial fibrillation: Secondary | ICD-10-CM | POA: Diagnosis not present

## 2018-10-17 DIAGNOSIS — K449 Diaphragmatic hernia without obstruction or gangrene: Secondary | ICD-10-CM | POA: Diagnosis not present

## 2018-10-17 DIAGNOSIS — I5032 Chronic diastolic (congestive) heart failure: Secondary | ICD-10-CM | POA: Diagnosis not present

## 2018-10-17 DIAGNOSIS — Z9181 History of falling: Secondary | ICD-10-CM | POA: Diagnosis not present

## 2018-10-17 DIAGNOSIS — J189 Pneumonia, unspecified organism: Secondary | ICD-10-CM | POA: Diagnosis not present

## 2018-10-17 DIAGNOSIS — N183 Chronic kidney disease, stage 3 (moderate): Secondary | ICD-10-CM | POA: Diagnosis not present

## 2018-10-17 DIAGNOSIS — B9620 Unspecified Escherichia coli [E. coli] as the cause of diseases classified elsewhere: Secondary | ICD-10-CM | POA: Diagnosis not present

## 2018-10-17 DIAGNOSIS — N390 Urinary tract infection, site not specified: Secondary | ICD-10-CM | POA: Diagnosis not present

## 2018-10-17 DIAGNOSIS — E46 Unspecified protein-calorie malnutrition: Secondary | ICD-10-CM | POA: Diagnosis not present

## 2018-10-17 DIAGNOSIS — K269 Duodenal ulcer, unspecified as acute or chronic, without hemorrhage or perforation: Secondary | ICD-10-CM | POA: Diagnosis not present

## 2018-10-17 DIAGNOSIS — E1122 Type 2 diabetes mellitus with diabetic chronic kidney disease: Secondary | ICD-10-CM | POA: Diagnosis not present

## 2018-10-17 DIAGNOSIS — Z87891 Personal history of nicotine dependence: Secondary | ICD-10-CM | POA: Diagnosis not present

## 2018-10-17 DIAGNOSIS — I214 Non-ST elevation (NSTEMI) myocardial infarction: Secondary | ICD-10-CM | POA: Diagnosis not present

## 2018-10-17 DIAGNOSIS — I130 Hypertensive heart and chronic kidney disease with heart failure and stage 1 through stage 4 chronic kidney disease, or unspecified chronic kidney disease: Secondary | ICD-10-CM | POA: Diagnosis not present

## 2018-10-21 DIAGNOSIS — I5032 Chronic diastolic (congestive) heart failure: Secondary | ICD-10-CM | POA: Diagnosis not present

## 2018-10-21 DIAGNOSIS — J189 Pneumonia, unspecified organism: Secondary | ICD-10-CM | POA: Diagnosis not present

## 2018-10-21 DIAGNOSIS — Z9181 History of falling: Secondary | ICD-10-CM | POA: Diagnosis not present

## 2018-10-21 DIAGNOSIS — B9620 Unspecified Escherichia coli [E. coli] as the cause of diseases classified elsewhere: Secondary | ICD-10-CM | POA: Diagnosis not present

## 2018-10-21 DIAGNOSIS — E1122 Type 2 diabetes mellitus with diabetic chronic kidney disease: Secondary | ICD-10-CM | POA: Diagnosis not present

## 2018-10-21 DIAGNOSIS — N390 Urinary tract infection, site not specified: Secondary | ICD-10-CM | POA: Diagnosis not present

## 2018-10-21 DIAGNOSIS — I4891 Unspecified atrial fibrillation: Secondary | ICD-10-CM | POA: Diagnosis not present

## 2018-10-21 DIAGNOSIS — E785 Hyperlipidemia, unspecified: Secondary | ICD-10-CM | POA: Diagnosis not present

## 2018-10-21 DIAGNOSIS — I214 Non-ST elevation (NSTEMI) myocardial infarction: Secondary | ICD-10-CM | POA: Diagnosis not present

## 2018-10-21 DIAGNOSIS — K449 Diaphragmatic hernia without obstruction or gangrene: Secondary | ICD-10-CM | POA: Diagnosis not present

## 2018-10-21 DIAGNOSIS — K269 Duodenal ulcer, unspecified as acute or chronic, without hemorrhage or perforation: Secondary | ICD-10-CM | POA: Diagnosis not present

## 2018-10-21 DIAGNOSIS — I130 Hypertensive heart and chronic kidney disease with heart failure and stage 1 through stage 4 chronic kidney disease, or unspecified chronic kidney disease: Secondary | ICD-10-CM | POA: Diagnosis not present

## 2018-10-21 DIAGNOSIS — N183 Chronic kidney disease, stage 3 (moderate): Secondary | ICD-10-CM | POA: Diagnosis not present

## 2018-10-21 DIAGNOSIS — E46 Unspecified protein-calorie malnutrition: Secondary | ICD-10-CM | POA: Diagnosis not present

## 2018-10-21 DIAGNOSIS — Z87891 Personal history of nicotine dependence: Secondary | ICD-10-CM | POA: Diagnosis not present

## 2018-10-23 ENCOUNTER — Encounter: Payer: Self-pay | Admitting: Registered Nurse

## 2018-10-23 ENCOUNTER — Encounter: Payer: Medicare Other | Attending: Registered Nurse | Admitting: Registered Nurse

## 2018-10-23 ENCOUNTER — Other Ambulatory Visit: Payer: Self-pay

## 2018-10-23 VITALS — BP 124/86 | HR 74 | Ht 69.5 in | Wt 232.0 lb

## 2018-10-23 DIAGNOSIS — R5381 Other malaise: Secondary | ICD-10-CM | POA: Insufficient documentation

## 2018-10-23 DIAGNOSIS — Z8679 Personal history of other diseases of the circulatory system: Secondary | ICD-10-CM | POA: Diagnosis not present

## 2018-10-23 DIAGNOSIS — E669 Obesity, unspecified: Secondary | ICD-10-CM | POA: Diagnosis present

## 2018-10-23 DIAGNOSIS — Z8719 Personal history of other diseases of the digestive system: Secondary | ICD-10-CM | POA: Diagnosis not present

## 2018-10-23 DIAGNOSIS — E1169 Type 2 diabetes mellitus with other specified complication: Secondary | ICD-10-CM | POA: Diagnosis not present

## 2018-10-23 DIAGNOSIS — D62 Acute posthemorrhagic anemia: Secondary | ICD-10-CM | POA: Insufficient documentation

## 2018-10-23 DIAGNOSIS — I5032 Chronic diastolic (congestive) heart failure: Secondary | ICD-10-CM | POA: Diagnosis not present

## 2018-10-23 DIAGNOSIS — N183 Chronic kidney disease, stage 3 unspecified: Secondary | ICD-10-CM

## 2018-10-23 NOTE — Progress Notes (Signed)
Subjective:    Patient ID: Erik Morales, male    DOB: August 02, 1941, 78 y.o.   MRN: [id number]  HPI: Erik Morales is a 78 y.o. male who is here for Transitional care visit. He was admitted to an outside hospital on 09/25/2018 after being found in the ditch by his wife with unknown period of time being down. Hospital course was significant for Atrial Flutter was treated with anticoagulation however he developed GI bleed with bloody stools and his hemoglobin dropped to 5.7, he was transfused with PRBC's. He was transferred to [facility]  For a higher level of care.  He was admitted to [facility] on 10/02/2018 and discharged 10/11/2018. He was discharged home with outpatient therapy with [facility]. He denies pain. Also reports he has a good appetite.  Family in room all questions answered.     Pain Inventory Average Pain 0 Pain Right Now 0 My pain is na  In the last 24 hours, has pain interfered with the following? General activity 0 Relation with others 0 Enjoyment of life 0 What TIME of day is your pain at its worst? na Sleep (in general) Good  Pain is worse with: na Pain improves with: na Relief from Meds: na  Mobility walk with assistance use a walker  Function retired Do you have any goals in this area?  yes  Neuro/Psych No problems in this area  Prior Studies Any changes since last visit?  no  Physicians involved in your care Primary care [place]   Family History  Problem Relation Age of Onset  . Diabetes Son    Social History   Socioeconomic History  . Marital status: Unknown    Spouse name: Not on file  . Number of children: Not on file  . Years of education: Not on file  . Highest education level: Not on file  Occupational History  . Not on file  Social Needs  . Financial resource strain: Not on file  . Food insecurity:    Worry: Not on file    Inability: Not on file  . Transportation needs:    Medical: Not on file   Non-medical: Not on file  Tobacco Use  . Smoking status: Former Research scientist (life sciences)  . Smokeless tobacco: Never Used  . Tobacco comment: Quit 1985  Substance and Sexual Activity  . Alcohol use: Never    Frequency: Never  . Drug use: Never  . Sexual activity: Not on file  Lifestyle  . Physical activity:    Days per week: Not on file    Minutes per session: Not on file  . Stress: Not on file  Relationships  . Social connections:    Talks on phone: Not on file    Gets together: Not on file    Attends religious service: Not on file    Active member of club or organization: Not on file    Attends meetings of clubs or organizations: Not on file    Relationship status: Not on file  Other Topics Concern  . Not on file  Social History Narrative  . Not on file   Past Surgical History:  Procedure Laterality Date  . ESOPHAGOGASTRODUODENOSCOPY (EGD) WITH PROPOFOL N/A 09/27/2018   Procedure: ESOPHAGOGASTRODUODENOSCOPY (EGD) WITH PROPOFOL;  Surgeon: Doran Stabler, MD;  Location: [place];  Service: Gastroenterology;  Laterality: N/A;  . HOT HEMOSTASIS N/A 09/27/2018   Procedure: HOT HEMOSTASIS (ARGON PLASMA COAGULATION/BICAP);  Surgeon: Doran Stabler,  MD;  Location: [place];  Service: Gastroenterology;  Laterality: N/A;  . SUBMUCOSAL INJECTION  09/27/2018   Procedure: SUBMUCOSAL INJECTION;  Surgeon: Doran Stabler, MD;  Location: [facility]COPY;  Service: Gastroenterology;;   Past Medical History:  Diagnosis Date  . DM2 (diabetes mellitus, type 2) ([place])   . HLD (hyperlipidemia)   . HTN (hypertension)    BP 124/86   Pulse 74   Ht 5' 9.5" (1.765 m)   Wt 232 lb (105.2 kg)   SpO2 97%   BMI 33.77 kg/m   Opioid Risk Score:   Fall Risk Score:  `1  Depression screen PHQ 2/9  Depression screen PHQ 2/9 10/23/2018  Decreased Interest 0  Down, Depressed, Hopeless 0  PHQ - 2 Score 0     Review of Systems  Constitutional: Negative.   HENT: Negative.   Eyes: Negative.     Respiratory: Negative.   Cardiovascular: Negative.   Gastrointestinal: Negative.   Endocrine: Negative.   Genitourinary: Negative.   Musculoskeletal: Negative.   Skin: Negative.   Allergic/Immunologic: Negative.   Neurological: Negative.   Hematological: Negative.   Psychiatric/Behavioral: Negative.   All other systems reviewed and are negative.      Objective:   Physical Exam Vitals signs and nursing note reviewed.  Constitutional:      Appearance: Normal appearance.  Neck:     Musculoskeletal: Normal range of motion and neck supple.  Cardiovascular:     Rate and Rhythm: Normal rate and regular rhythm.     Pulses: Normal pulses.     Heart sounds: Normal heart sounds.  Pulmonary:     Effort: Pulmonary effort is normal.     Breath sounds: Normal breath sounds.  Musculoskeletal:     Comments: Normal Muscle Bulk and Muscle Testing Reveals:  Upper Extremities: Right: Decreased ROM 45 Degrees and Muscle Strength 5/5  Left: Full ROM and Muscle Strength 5/5  Lower Extremities: Full ROM and Muscle Strength 5/5  Arises from Table slowly using walker for support Narrow Based  Gait   Skin:    General: Skin is warm and dry.  Neurological:     Mental Status: He is alert and oriented to person, place, and time.  Psychiatric:        Mood and Affect: Mood normal.        Behavior: Behavior normal.           Assessment & Plan:  1. Debility: Continue with Outpatient Therapy. Continue to Monitor.  2. History of GI Bleed/ Acute Blood Loss Anemia: Post Transfusion: Continue Iron Supplement: PCP Following.  3.CKD Stage 3: PCP Following. Continue to Monitor.  4. Congestive Heart Failure: Cardiology Following.  5. Type 2 DM in Obese Patient: Continue current medication regimen. PCP Following.  6. Atrial Fibrillation : Continue current medication: Cardiology Following.   20 minutes of face to face patient care time was spent during this visit. All questions were encouraged and  answered.  F/U in 4- 6 weeks with Dr. Posey Pronto.

## 2018-10-24 DIAGNOSIS — Z87891 Personal history of nicotine dependence: Secondary | ICD-10-CM | POA: Diagnosis not present

## 2018-10-24 DIAGNOSIS — N390 Urinary tract infection, site not specified: Secondary | ICD-10-CM | POA: Diagnosis not present

## 2018-10-24 DIAGNOSIS — N183 Chronic kidney disease, stage 3 (moderate): Secondary | ICD-10-CM | POA: Diagnosis not present

## 2018-10-24 DIAGNOSIS — I5032 Chronic diastolic (congestive) heart failure: Secondary | ICD-10-CM | POA: Diagnosis not present

## 2018-10-24 DIAGNOSIS — E1122 Type 2 diabetes mellitus with diabetic chronic kidney disease: Secondary | ICD-10-CM | POA: Diagnosis not present

## 2018-10-24 DIAGNOSIS — J189 Pneumonia, unspecified organism: Secondary | ICD-10-CM | POA: Diagnosis not present

## 2018-10-24 DIAGNOSIS — K449 Diaphragmatic hernia without obstruction or gangrene: Secondary | ICD-10-CM | POA: Diagnosis not present

## 2018-10-24 DIAGNOSIS — K269 Duodenal ulcer, unspecified as acute or chronic, without hemorrhage or perforation: Secondary | ICD-10-CM | POA: Diagnosis not present

## 2018-10-24 DIAGNOSIS — E785 Hyperlipidemia, unspecified: Secondary | ICD-10-CM | POA: Diagnosis not present

## 2018-10-24 DIAGNOSIS — I4891 Unspecified atrial fibrillation: Secondary | ICD-10-CM | POA: Diagnosis not present

## 2018-10-24 DIAGNOSIS — B9620 Unspecified Escherichia coli [E. coli] as the cause of diseases classified elsewhere: Secondary | ICD-10-CM | POA: Diagnosis not present

## 2018-10-24 DIAGNOSIS — I214 Non-ST elevation (NSTEMI) myocardial infarction: Secondary | ICD-10-CM | POA: Diagnosis not present

## 2018-10-24 DIAGNOSIS — I130 Hypertensive heart and chronic kidney disease with heart failure and stage 1 through stage 4 chronic kidney disease, or unspecified chronic kidney disease: Secondary | ICD-10-CM | POA: Diagnosis not present

## 2018-10-24 DIAGNOSIS — E46 Unspecified protein-calorie malnutrition: Secondary | ICD-10-CM | POA: Diagnosis not present

## 2018-10-24 DIAGNOSIS — Z9181 History of falling: Secondary | ICD-10-CM | POA: Diagnosis not present

## 2018-10-26 ENCOUNTER — Other Ambulatory Visit (HOSPITAL_COMMUNITY): Payer: Self-pay | Admitting: Physical Medicine and Rehabilitation

## 2018-10-28 DIAGNOSIS — E785 Hyperlipidemia, unspecified: Secondary | ICD-10-CM | POA: Diagnosis not present

## 2018-10-28 DIAGNOSIS — Z87891 Personal history of nicotine dependence: Secondary | ICD-10-CM | POA: Diagnosis not present

## 2018-10-28 DIAGNOSIS — I4891 Unspecified atrial fibrillation: Secondary | ICD-10-CM | POA: Diagnosis not present

## 2018-10-28 DIAGNOSIS — I214 Non-ST elevation (NSTEMI) myocardial infarction: Secondary | ICD-10-CM | POA: Diagnosis not present

## 2018-10-28 DIAGNOSIS — N390 Urinary tract infection, site not specified: Secondary | ICD-10-CM | POA: Diagnosis not present

## 2018-10-28 DIAGNOSIS — K449 Diaphragmatic hernia without obstruction or gangrene: Secondary | ICD-10-CM | POA: Diagnosis not present

## 2018-10-28 DIAGNOSIS — B9620 Unspecified Escherichia coli [E. coli] as the cause of diseases classified elsewhere: Secondary | ICD-10-CM | POA: Diagnosis not present

## 2018-10-28 DIAGNOSIS — E1122 Type 2 diabetes mellitus with diabetic chronic kidney disease: Secondary | ICD-10-CM | POA: Diagnosis not present

## 2018-10-28 DIAGNOSIS — N183 Chronic kidney disease, stage 3 (moderate): Secondary | ICD-10-CM | POA: Diagnosis not present

## 2018-10-28 DIAGNOSIS — E46 Unspecified protein-calorie malnutrition: Secondary | ICD-10-CM | POA: Diagnosis not present

## 2018-10-28 DIAGNOSIS — K269 Duodenal ulcer, unspecified as acute or chronic, without hemorrhage or perforation: Secondary | ICD-10-CM | POA: Diagnosis not present

## 2018-10-28 DIAGNOSIS — Z9181 History of falling: Secondary | ICD-10-CM | POA: Diagnosis not present

## 2018-10-28 DIAGNOSIS — I5032 Chronic diastolic (congestive) heart failure: Secondary | ICD-10-CM | POA: Diagnosis not present

## 2018-10-28 DIAGNOSIS — J189 Pneumonia, unspecified organism: Secondary | ICD-10-CM | POA: Diagnosis not present

## 2018-10-28 DIAGNOSIS — I130 Hypertensive heart and chronic kidney disease with heart failure and stage 1 through stage 4 chronic kidney disease, or unspecified chronic kidney disease: Secondary | ICD-10-CM | POA: Diagnosis not present

## 2018-10-30 DIAGNOSIS — Z79899 Other long term (current) drug therapy: Secondary | ICD-10-CM | POA: Diagnosis not present

## 2018-10-30 DIAGNOSIS — E785 Hyperlipidemia, unspecified: Secondary | ICD-10-CM | POA: Diagnosis not present

## 2018-10-30 DIAGNOSIS — I10 Essential (primary) hypertension: Secondary | ICD-10-CM | POA: Diagnosis not present

## 2018-10-30 DIAGNOSIS — E1121 Type 2 diabetes mellitus with diabetic nephropathy: Secondary | ICD-10-CM | POA: Diagnosis not present

## 2018-10-30 DIAGNOSIS — E559 Vitamin D deficiency, unspecified: Secondary | ICD-10-CM | POA: Diagnosis not present

## 2018-10-31 DIAGNOSIS — Z87891 Personal history of nicotine dependence: Secondary | ICD-10-CM | POA: Diagnosis not present

## 2018-10-31 DIAGNOSIS — K269 Duodenal ulcer, unspecified as acute or chronic, without hemorrhage or perforation: Secondary | ICD-10-CM | POA: Diagnosis not present

## 2018-10-31 DIAGNOSIS — E1122 Type 2 diabetes mellitus with diabetic chronic kidney disease: Secondary | ICD-10-CM | POA: Diagnosis not present

## 2018-10-31 DIAGNOSIS — I5032 Chronic diastolic (congestive) heart failure: Secondary | ICD-10-CM | POA: Diagnosis not present

## 2018-10-31 DIAGNOSIS — I214 Non-ST elevation (NSTEMI) myocardial infarction: Secondary | ICD-10-CM | POA: Diagnosis not present

## 2018-10-31 DIAGNOSIS — B9620 Unspecified Escherichia coli [E. coli] as the cause of diseases classified elsewhere: Secondary | ICD-10-CM | POA: Diagnosis not present

## 2018-10-31 DIAGNOSIS — I130 Hypertensive heart and chronic kidney disease with heart failure and stage 1 through stage 4 chronic kidney disease, or unspecified chronic kidney disease: Secondary | ICD-10-CM | POA: Diagnosis not present

## 2018-10-31 DIAGNOSIS — Z9181 History of falling: Secondary | ICD-10-CM | POA: Diagnosis not present

## 2018-10-31 DIAGNOSIS — N390 Urinary tract infection, site not specified: Secondary | ICD-10-CM | POA: Diagnosis not present

## 2018-10-31 DIAGNOSIS — I4891 Unspecified atrial fibrillation: Secondary | ICD-10-CM | POA: Diagnosis not present

## 2018-10-31 DIAGNOSIS — N183 Chronic kidney disease, stage 3 (moderate): Secondary | ICD-10-CM | POA: Diagnosis not present

## 2018-10-31 DIAGNOSIS — E785 Hyperlipidemia, unspecified: Secondary | ICD-10-CM | POA: Diagnosis not present

## 2018-10-31 DIAGNOSIS — J189 Pneumonia, unspecified organism: Secondary | ICD-10-CM | POA: Diagnosis not present

## 2018-10-31 DIAGNOSIS — E46 Unspecified protein-calorie malnutrition: Secondary | ICD-10-CM | POA: Diagnosis not present

## 2018-10-31 DIAGNOSIS — K449 Diaphragmatic hernia without obstruction or gangrene: Secondary | ICD-10-CM | POA: Diagnosis not present

## 2018-11-03 ENCOUNTER — Encounter: Payer: Self-pay | Admitting: Internal Medicine

## 2018-11-04 DIAGNOSIS — N183 Chronic kidney disease, stage 3 (moderate): Secondary | ICD-10-CM | POA: Diagnosis not present

## 2018-11-04 DIAGNOSIS — E46 Unspecified protein-calorie malnutrition: Secondary | ICD-10-CM | POA: Diagnosis not present

## 2018-11-04 DIAGNOSIS — N390 Urinary tract infection, site not specified: Secondary | ICD-10-CM | POA: Diagnosis not present

## 2018-11-04 DIAGNOSIS — I5032 Chronic diastolic (congestive) heart failure: Secondary | ICD-10-CM | POA: Diagnosis not present

## 2018-11-04 DIAGNOSIS — B9620 Unspecified Escherichia coli [E. coli] as the cause of diseases classified elsewhere: Secondary | ICD-10-CM | POA: Diagnosis not present

## 2018-11-04 DIAGNOSIS — E785 Hyperlipidemia, unspecified: Secondary | ICD-10-CM | POA: Diagnosis not present

## 2018-11-04 DIAGNOSIS — K449 Diaphragmatic hernia without obstruction or gangrene: Secondary | ICD-10-CM | POA: Diagnosis not present

## 2018-11-04 DIAGNOSIS — Z9181 History of falling: Secondary | ICD-10-CM | POA: Diagnosis not present

## 2018-11-04 DIAGNOSIS — K269 Duodenal ulcer, unspecified as acute or chronic, without hemorrhage or perforation: Secondary | ICD-10-CM | POA: Diagnosis not present

## 2018-11-04 DIAGNOSIS — J189 Pneumonia, unspecified organism: Secondary | ICD-10-CM | POA: Diagnosis not present

## 2018-11-04 DIAGNOSIS — I4891 Unspecified atrial fibrillation: Secondary | ICD-10-CM | POA: Diagnosis not present

## 2018-11-04 DIAGNOSIS — E1122 Type 2 diabetes mellitus with diabetic chronic kidney disease: Secondary | ICD-10-CM | POA: Diagnosis not present

## 2018-11-04 DIAGNOSIS — I214 Non-ST elevation (NSTEMI) myocardial infarction: Secondary | ICD-10-CM | POA: Diagnosis not present

## 2018-11-04 DIAGNOSIS — I130 Hypertensive heart and chronic kidney disease with heart failure and stage 1 through stage 4 chronic kidney disease, or unspecified chronic kidney disease: Secondary | ICD-10-CM | POA: Diagnosis not present

## 2018-11-04 DIAGNOSIS — Z87891 Personal history of nicotine dependence: Secondary | ICD-10-CM | POA: Diagnosis not present

## 2018-11-06 DIAGNOSIS — K269 Duodenal ulcer, unspecified as acute or chronic, without hemorrhage or perforation: Secondary | ICD-10-CM | POA: Diagnosis not present

## 2018-11-06 DIAGNOSIS — N183 Chronic kidney disease, stage 3 (moderate): Secondary | ICD-10-CM | POA: Diagnosis not present

## 2018-11-06 DIAGNOSIS — E46 Unspecified protein-calorie malnutrition: Secondary | ICD-10-CM | POA: Diagnosis not present

## 2018-11-06 DIAGNOSIS — N390 Urinary tract infection, site not specified: Secondary | ICD-10-CM | POA: Diagnosis not present

## 2018-11-06 DIAGNOSIS — K449 Diaphragmatic hernia without obstruction or gangrene: Secondary | ICD-10-CM | POA: Diagnosis not present

## 2018-11-06 DIAGNOSIS — B9620 Unspecified Escherichia coli [E. coli] as the cause of diseases classified elsewhere: Secondary | ICD-10-CM | POA: Diagnosis not present

## 2018-11-06 DIAGNOSIS — E1122 Type 2 diabetes mellitus with diabetic chronic kidney disease: Secondary | ICD-10-CM | POA: Diagnosis not present

## 2018-11-06 DIAGNOSIS — J189 Pneumonia, unspecified organism: Secondary | ICD-10-CM | POA: Diagnosis not present

## 2018-11-06 DIAGNOSIS — I4891 Unspecified atrial fibrillation: Secondary | ICD-10-CM | POA: Diagnosis not present

## 2018-11-06 DIAGNOSIS — I130 Hypertensive heart and chronic kidney disease with heart failure and stage 1 through stage 4 chronic kidney disease, or unspecified chronic kidney disease: Secondary | ICD-10-CM | POA: Diagnosis not present

## 2018-11-06 DIAGNOSIS — Z87891 Personal history of nicotine dependence: Secondary | ICD-10-CM | POA: Diagnosis not present

## 2018-11-06 DIAGNOSIS — I214 Non-ST elevation (NSTEMI) myocardial infarction: Secondary | ICD-10-CM | POA: Diagnosis not present

## 2018-11-06 DIAGNOSIS — I5032 Chronic diastolic (congestive) heart failure: Secondary | ICD-10-CM | POA: Diagnosis not present

## 2018-11-06 DIAGNOSIS — E785 Hyperlipidemia, unspecified: Secondary | ICD-10-CM | POA: Diagnosis not present

## 2018-11-06 DIAGNOSIS — Z9181 History of falling: Secondary | ICD-10-CM | POA: Diagnosis not present

## 2018-11-08 DIAGNOSIS — I5032 Chronic diastolic (congestive) heart failure: Secondary | ICD-10-CM | POA: Diagnosis not present

## 2018-11-09 ENCOUNTER — Other Ambulatory Visit: Payer: Self-pay | Admitting: Physical Medicine and Rehabilitation

## 2018-11-10 DIAGNOSIS — Z9181 History of falling: Secondary | ICD-10-CM | POA: Diagnosis not present

## 2018-11-10 DIAGNOSIS — E46 Unspecified protein-calorie malnutrition: Secondary | ICD-10-CM | POA: Diagnosis not present

## 2018-11-10 DIAGNOSIS — I5032 Chronic diastolic (congestive) heart failure: Secondary | ICD-10-CM | POA: Diagnosis not present

## 2018-11-10 DIAGNOSIS — J189 Pneumonia, unspecified organism: Secondary | ICD-10-CM | POA: Diagnosis not present

## 2018-11-10 DIAGNOSIS — B9620 Unspecified Escherichia coli [E. coli] as the cause of diseases classified elsewhere: Secondary | ICD-10-CM | POA: Diagnosis not present

## 2018-11-10 DIAGNOSIS — N390 Urinary tract infection, site not specified: Secondary | ICD-10-CM | POA: Diagnosis not present

## 2018-11-10 DIAGNOSIS — K269 Duodenal ulcer, unspecified as acute or chronic, without hemorrhage or perforation: Secondary | ICD-10-CM | POA: Diagnosis not present

## 2018-11-10 DIAGNOSIS — I214 Non-ST elevation (NSTEMI) myocardial infarction: Secondary | ICD-10-CM | POA: Diagnosis not present

## 2018-11-10 DIAGNOSIS — K449 Diaphragmatic hernia without obstruction or gangrene: Secondary | ICD-10-CM | POA: Diagnosis not present

## 2018-11-10 DIAGNOSIS — E1122 Type 2 diabetes mellitus with diabetic chronic kidney disease: Secondary | ICD-10-CM | POA: Diagnosis not present

## 2018-11-10 DIAGNOSIS — N183 Chronic kidney disease, stage 3 (moderate): Secondary | ICD-10-CM | POA: Diagnosis not present

## 2018-11-10 DIAGNOSIS — I130 Hypertensive heart and chronic kidney disease with heart failure and stage 1 through stage 4 chronic kidney disease, or unspecified chronic kidney disease: Secondary | ICD-10-CM | POA: Diagnosis not present

## 2018-11-10 DIAGNOSIS — I4891 Unspecified atrial fibrillation: Secondary | ICD-10-CM | POA: Diagnosis not present

## 2018-11-10 DIAGNOSIS — E785 Hyperlipidemia, unspecified: Secondary | ICD-10-CM | POA: Diagnosis not present

## 2018-11-10 DIAGNOSIS — Z87891 Personal history of nicotine dependence: Secondary | ICD-10-CM | POA: Diagnosis not present

## 2018-11-11 ENCOUNTER — Telehealth: Payer: Self-pay | Admitting: Cardiology

## 2018-11-11 ENCOUNTER — Other Ambulatory Visit: Payer: Self-pay | Admitting: Physical Medicine and Rehabilitation

## 2018-11-11 NOTE — Telephone Encounter (Signed)
°*  STAT* If patient is at the pharmacy, call can be transferred to refill team.   1. Which medications need to be refilled? (please list name of each medication and dose if known) Amlodipine 200mg  takes twice daily  2. Which pharmacy/location (including street and city if local pharmacy) is medication to be sent to? [place]  3. Do they need a 30 day or 90 day supply? 90  Pharmacy tel# [phone]

## 2018-11-12 ENCOUNTER — Other Ambulatory Visit: Payer: Self-pay

## 2018-11-12 DIAGNOSIS — J189 Pneumonia, unspecified organism: Secondary | ICD-10-CM | POA: Diagnosis not present

## 2018-11-12 DIAGNOSIS — K449 Diaphragmatic hernia without obstruction or gangrene: Secondary | ICD-10-CM | POA: Diagnosis not present

## 2018-11-12 DIAGNOSIS — E46 Unspecified protein-calorie malnutrition: Secondary | ICD-10-CM | POA: Diagnosis not present

## 2018-11-12 DIAGNOSIS — N390 Urinary tract infection, site not specified: Secondary | ICD-10-CM | POA: Diagnosis not present

## 2018-11-12 DIAGNOSIS — I4891 Unspecified atrial fibrillation: Secondary | ICD-10-CM | POA: Diagnosis not present

## 2018-11-12 DIAGNOSIS — K269 Duodenal ulcer, unspecified as acute or chronic, without hemorrhage or perforation: Secondary | ICD-10-CM | POA: Diagnosis not present

## 2018-11-12 DIAGNOSIS — N183 Chronic kidney disease, stage 3 (moderate): Secondary | ICD-10-CM | POA: Diagnosis not present

## 2018-11-12 DIAGNOSIS — I5032 Chronic diastolic (congestive) heart failure: Secondary | ICD-10-CM | POA: Diagnosis not present

## 2018-11-12 DIAGNOSIS — E1122 Type 2 diabetes mellitus with diabetic chronic kidney disease: Secondary | ICD-10-CM | POA: Diagnosis not present

## 2018-11-12 DIAGNOSIS — I214 Non-ST elevation (NSTEMI) myocardial infarction: Secondary | ICD-10-CM | POA: Diagnosis not present

## 2018-11-12 DIAGNOSIS — B9620 Unspecified Escherichia coli [E. coli] as the cause of diseases classified elsewhere: Secondary | ICD-10-CM | POA: Diagnosis not present

## 2018-11-12 DIAGNOSIS — I130 Hypertensive heart and chronic kidney disease with heart failure and stage 1 through stage 4 chronic kidney disease, or unspecified chronic kidney disease: Secondary | ICD-10-CM | POA: Diagnosis not present

## 2018-11-12 DIAGNOSIS — Z87891 Personal history of nicotine dependence: Secondary | ICD-10-CM | POA: Diagnosis not present

## 2018-11-12 DIAGNOSIS — E785 Hyperlipidemia, unspecified: Secondary | ICD-10-CM | POA: Diagnosis not present

## 2018-11-12 DIAGNOSIS — Z9181 History of falling: Secondary | ICD-10-CM | POA: Diagnosis not present

## 2018-11-12 MED ORDER — AMIODARONE HCL 200 MG PO TABS: 200.0000 mg | ORAL_TABLET | Freq: Two times a day (BID) | ORAL | 3 refills | Status: DC

## 2018-11-12 NOTE — Telephone Encounter (Signed)
Call patient for clarification on rx refill for amiodarone. Patient would like to reschedule follow up appt due to [place]. Patient states his is not currently having any issues and advised by RN to call in to schedule televisit or webex, if something changes. No further questions at this time.

## 2018-11-14 DIAGNOSIS — J45909 Unspecified asthma, uncomplicated: Secondary | ICD-10-CM | POA: Diagnosis not present

## 2018-11-14 DIAGNOSIS — J309 Allergic rhinitis, unspecified: Secondary | ICD-10-CM | POA: Diagnosis not present

## 2018-11-14 DIAGNOSIS — R05 Cough: Secondary | ICD-10-CM | POA: Diagnosis not present

## 2018-11-14 DIAGNOSIS — J029 Acute pharyngitis, unspecified: Secondary | ICD-10-CM | POA: Diagnosis not present

## 2018-11-16 ENCOUNTER — Other Ambulatory Visit: Payer: Self-pay | Admitting: Physical Medicine and Rehabilitation

## 2018-11-17 ENCOUNTER — Ambulatory Visit: Payer: Medicare Other | Admitting: Cardiology

## 2018-11-17 DIAGNOSIS — J189 Pneumonia, unspecified organism: Secondary | ICD-10-CM | POA: Diagnosis not present

## 2018-11-17 DIAGNOSIS — Z87891 Personal history of nicotine dependence: Secondary | ICD-10-CM | POA: Diagnosis not present

## 2018-11-17 DIAGNOSIS — I4891 Unspecified atrial fibrillation: Secondary | ICD-10-CM | POA: Diagnosis not present

## 2018-11-17 DIAGNOSIS — B9620 Unspecified Escherichia coli [E. coli] as the cause of diseases classified elsewhere: Secondary | ICD-10-CM | POA: Diagnosis not present

## 2018-11-17 DIAGNOSIS — E46 Unspecified protein-calorie malnutrition: Secondary | ICD-10-CM | POA: Diagnosis not present

## 2018-11-17 DIAGNOSIS — I214 Non-ST elevation (NSTEMI) myocardial infarction: Secondary | ICD-10-CM | POA: Diagnosis not present

## 2018-11-17 DIAGNOSIS — E1122 Type 2 diabetes mellitus with diabetic chronic kidney disease: Secondary | ICD-10-CM | POA: Diagnosis not present

## 2018-11-17 DIAGNOSIS — Z9181 History of falling: Secondary | ICD-10-CM | POA: Diagnosis not present

## 2018-11-17 DIAGNOSIS — N390 Urinary tract infection, site not specified: Secondary | ICD-10-CM | POA: Diagnosis not present

## 2018-11-17 DIAGNOSIS — E785 Hyperlipidemia, unspecified: Secondary | ICD-10-CM | POA: Diagnosis not present

## 2018-11-17 DIAGNOSIS — K449 Diaphragmatic hernia without obstruction or gangrene: Secondary | ICD-10-CM | POA: Diagnosis not present

## 2018-11-17 DIAGNOSIS — I5032 Chronic diastolic (congestive) heart failure: Secondary | ICD-10-CM | POA: Diagnosis not present

## 2018-11-17 DIAGNOSIS — I130 Hypertensive heart and chronic kidney disease with heart failure and stage 1 through stage 4 chronic kidney disease, or unspecified chronic kidney disease: Secondary | ICD-10-CM | POA: Diagnosis not present

## 2018-11-17 DIAGNOSIS — K269 Duodenal ulcer, unspecified as acute or chronic, without hemorrhage or perforation: Secondary | ICD-10-CM | POA: Diagnosis not present

## 2018-11-17 DIAGNOSIS — N183 Chronic kidney disease, stage 3 (moderate): Secondary | ICD-10-CM | POA: Diagnosis not present

## 2018-11-19 DIAGNOSIS — I214 Non-ST elevation (NSTEMI) myocardial infarction: Secondary | ICD-10-CM | POA: Diagnosis not present

## 2018-11-19 DIAGNOSIS — E1122 Type 2 diabetes mellitus with diabetic chronic kidney disease: Secondary | ICD-10-CM | POA: Diagnosis not present

## 2018-11-19 DIAGNOSIS — Z9181 History of falling: Secondary | ICD-10-CM | POA: Diagnosis not present

## 2018-11-19 DIAGNOSIS — I4891 Unspecified atrial fibrillation: Secondary | ICD-10-CM | POA: Diagnosis not present

## 2018-11-19 DIAGNOSIS — J189 Pneumonia, unspecified organism: Secondary | ICD-10-CM | POA: Diagnosis not present

## 2018-11-19 DIAGNOSIS — K269 Duodenal ulcer, unspecified as acute or chronic, without hemorrhage or perforation: Secondary | ICD-10-CM | POA: Diagnosis not present

## 2018-11-19 DIAGNOSIS — N183 Chronic kidney disease, stage 3 (moderate): Secondary | ICD-10-CM | POA: Diagnosis not present

## 2018-11-19 DIAGNOSIS — I5032 Chronic diastolic (congestive) heart failure: Secondary | ICD-10-CM | POA: Diagnosis not present

## 2018-11-19 DIAGNOSIS — B9620 Unspecified Escherichia coli [E. coli] as the cause of diseases classified elsewhere: Secondary | ICD-10-CM | POA: Diagnosis not present

## 2018-11-19 DIAGNOSIS — N390 Urinary tract infection, site not specified: Secondary | ICD-10-CM | POA: Diagnosis not present

## 2018-11-19 DIAGNOSIS — I130 Hypertensive heart and chronic kidney disease with heart failure and stage 1 through stage 4 chronic kidney disease, or unspecified chronic kidney disease: Secondary | ICD-10-CM | POA: Diagnosis not present

## 2018-11-19 DIAGNOSIS — Z87891 Personal history of nicotine dependence: Secondary | ICD-10-CM | POA: Diagnosis not present

## 2018-11-19 DIAGNOSIS — K449 Diaphragmatic hernia without obstruction or gangrene: Secondary | ICD-10-CM | POA: Diagnosis not present

## 2018-11-19 DIAGNOSIS — E46 Unspecified protein-calorie malnutrition: Secondary | ICD-10-CM | POA: Diagnosis not present

## 2018-11-19 DIAGNOSIS — E785 Hyperlipidemia, unspecified: Secondary | ICD-10-CM | POA: Diagnosis not present

## 2018-11-24 ENCOUNTER — Ambulatory Visit: Payer: Medicare Other | Admitting: Physical Medicine & Rehabilitation

## 2018-11-25 DIAGNOSIS — N183 Chronic kidney disease, stage 3 (moderate): Secondary | ICD-10-CM | POA: Diagnosis not present

## 2018-11-25 DIAGNOSIS — I214 Non-ST elevation (NSTEMI) myocardial infarction: Secondary | ICD-10-CM | POA: Diagnosis not present

## 2018-11-25 DIAGNOSIS — E1122 Type 2 diabetes mellitus with diabetic chronic kidney disease: Secondary | ICD-10-CM | POA: Diagnosis not present

## 2018-11-25 DIAGNOSIS — K269 Duodenal ulcer, unspecified as acute or chronic, without hemorrhage or perforation: Secondary | ICD-10-CM | POA: Diagnosis not present

## 2018-11-25 DIAGNOSIS — J189 Pneumonia, unspecified organism: Secondary | ICD-10-CM | POA: Diagnosis not present

## 2018-11-25 DIAGNOSIS — B9620 Unspecified Escherichia coli [E. coli] as the cause of diseases classified elsewhere: Secondary | ICD-10-CM | POA: Diagnosis not present

## 2018-11-25 DIAGNOSIS — E785 Hyperlipidemia, unspecified: Secondary | ICD-10-CM | POA: Diagnosis not present

## 2018-11-25 DIAGNOSIS — I130 Hypertensive heart and chronic kidney disease with heart failure and stage 1 through stage 4 chronic kidney disease, or unspecified chronic kidney disease: Secondary | ICD-10-CM | POA: Diagnosis not present

## 2018-11-25 DIAGNOSIS — I5032 Chronic diastolic (congestive) heart failure: Secondary | ICD-10-CM | POA: Diagnosis not present

## 2018-11-25 DIAGNOSIS — Z87891 Personal history of nicotine dependence: Secondary | ICD-10-CM | POA: Diagnosis not present

## 2018-11-25 DIAGNOSIS — Z9181 History of falling: Secondary | ICD-10-CM | POA: Diagnosis not present

## 2018-11-25 DIAGNOSIS — K449 Diaphragmatic hernia without obstruction or gangrene: Secondary | ICD-10-CM | POA: Diagnosis not present

## 2018-11-25 DIAGNOSIS — N390 Urinary tract infection, site not specified: Secondary | ICD-10-CM | POA: Diagnosis not present

## 2018-11-25 DIAGNOSIS — E46 Unspecified protein-calorie malnutrition: Secondary | ICD-10-CM | POA: Diagnosis not present

## 2018-11-25 DIAGNOSIS — I4891 Unspecified atrial fibrillation: Secondary | ICD-10-CM | POA: Diagnosis not present

## 2018-11-27 DIAGNOSIS — Z87891 Personal history of nicotine dependence: Secondary | ICD-10-CM | POA: Diagnosis not present

## 2018-11-27 DIAGNOSIS — N183 Chronic kidney disease, stage 3 (moderate): Secondary | ICD-10-CM | POA: Diagnosis not present

## 2018-11-27 DIAGNOSIS — K449 Diaphragmatic hernia without obstruction or gangrene: Secondary | ICD-10-CM | POA: Diagnosis not present

## 2018-11-27 DIAGNOSIS — E46 Unspecified protein-calorie malnutrition: Secondary | ICD-10-CM | POA: Diagnosis not present

## 2018-11-27 DIAGNOSIS — J189 Pneumonia, unspecified organism: Secondary | ICD-10-CM | POA: Diagnosis not present

## 2018-11-27 DIAGNOSIS — K269 Duodenal ulcer, unspecified as acute or chronic, without hemorrhage or perforation: Secondary | ICD-10-CM | POA: Diagnosis not present

## 2018-11-27 DIAGNOSIS — E1122 Type 2 diabetes mellitus with diabetic chronic kidney disease: Secondary | ICD-10-CM | POA: Diagnosis not present

## 2018-11-27 DIAGNOSIS — I130 Hypertensive heart and chronic kidney disease with heart failure and stage 1 through stage 4 chronic kidney disease, or unspecified chronic kidney disease: Secondary | ICD-10-CM | POA: Diagnosis not present

## 2018-11-27 DIAGNOSIS — I5032 Chronic diastolic (congestive) heart failure: Secondary | ICD-10-CM | POA: Diagnosis not present

## 2018-11-27 DIAGNOSIS — I4891 Unspecified atrial fibrillation: Secondary | ICD-10-CM | POA: Diagnosis not present

## 2018-11-27 DIAGNOSIS — B9620 Unspecified Escherichia coli [E. coli] as the cause of diseases classified elsewhere: Secondary | ICD-10-CM | POA: Diagnosis not present

## 2018-11-27 DIAGNOSIS — E785 Hyperlipidemia, unspecified: Secondary | ICD-10-CM | POA: Diagnosis not present

## 2018-11-27 DIAGNOSIS — N390 Urinary tract infection, site not specified: Secondary | ICD-10-CM | POA: Diagnosis not present

## 2018-11-27 DIAGNOSIS — I214 Non-ST elevation (NSTEMI) myocardial infarction: Secondary | ICD-10-CM | POA: Diagnosis not present

## 2018-11-27 DIAGNOSIS — Z9181 History of falling: Secondary | ICD-10-CM | POA: Diagnosis not present

## 2018-12-01 DIAGNOSIS — I214 Non-ST elevation (NSTEMI) myocardial infarction: Secondary | ICD-10-CM | POA: Diagnosis not present

## 2018-12-01 DIAGNOSIS — E785 Hyperlipidemia, unspecified: Secondary | ICD-10-CM | POA: Diagnosis not present

## 2018-12-01 DIAGNOSIS — Z9181 History of falling: Secondary | ICD-10-CM | POA: Diagnosis not present

## 2018-12-01 DIAGNOSIS — I5032 Chronic diastolic (congestive) heart failure: Secondary | ICD-10-CM | POA: Diagnosis not present

## 2018-12-01 DIAGNOSIS — J189 Pneumonia, unspecified organism: Secondary | ICD-10-CM | POA: Diagnosis not present

## 2018-12-01 DIAGNOSIS — N183 Chronic kidney disease, stage 3 (moderate): Secondary | ICD-10-CM | POA: Diagnosis not present

## 2018-12-01 DIAGNOSIS — B9620 Unspecified Escherichia coli [E. coli] as the cause of diseases classified elsewhere: Secondary | ICD-10-CM | POA: Diagnosis not present

## 2018-12-01 DIAGNOSIS — K269 Duodenal ulcer, unspecified as acute or chronic, without hemorrhage or perforation: Secondary | ICD-10-CM | POA: Diagnosis not present

## 2018-12-01 DIAGNOSIS — E1122 Type 2 diabetes mellitus with diabetic chronic kidney disease: Secondary | ICD-10-CM | POA: Diagnosis not present

## 2018-12-01 DIAGNOSIS — I4891 Unspecified atrial fibrillation: Secondary | ICD-10-CM | POA: Diagnosis not present

## 2018-12-01 DIAGNOSIS — Z87891 Personal history of nicotine dependence: Secondary | ICD-10-CM | POA: Diagnosis not present

## 2018-12-01 DIAGNOSIS — N390 Urinary tract infection, site not specified: Secondary | ICD-10-CM | POA: Diagnosis not present

## 2018-12-01 DIAGNOSIS — K449 Diaphragmatic hernia without obstruction or gangrene: Secondary | ICD-10-CM | POA: Diagnosis not present

## 2018-12-01 DIAGNOSIS — E46 Unspecified protein-calorie malnutrition: Secondary | ICD-10-CM | POA: Diagnosis not present

## 2018-12-01 DIAGNOSIS — I130 Hypertensive heart and chronic kidney disease with heart failure and stage 1 through stage 4 chronic kidney disease, or unspecified chronic kidney disease: Secondary | ICD-10-CM | POA: Diagnosis not present

## 2018-12-04 DIAGNOSIS — I4891 Unspecified atrial fibrillation: Secondary | ICD-10-CM | POA: Diagnosis not present

## 2018-12-04 DIAGNOSIS — E46 Unspecified protein-calorie malnutrition: Secondary | ICD-10-CM | POA: Diagnosis not present

## 2018-12-04 DIAGNOSIS — B9620 Unspecified Escherichia coli [E. coli] as the cause of diseases classified elsewhere: Secondary | ICD-10-CM | POA: Diagnosis not present

## 2018-12-04 DIAGNOSIS — E785 Hyperlipidemia, unspecified: Secondary | ICD-10-CM | POA: Diagnosis not present

## 2018-12-04 DIAGNOSIS — I130 Hypertensive heart and chronic kidney disease with heart failure and stage 1 through stage 4 chronic kidney disease, or unspecified chronic kidney disease: Secondary | ICD-10-CM | POA: Diagnosis not present

## 2018-12-04 DIAGNOSIS — N390 Urinary tract infection, site not specified: Secondary | ICD-10-CM | POA: Diagnosis not present

## 2018-12-04 DIAGNOSIS — Z87891 Personal history of nicotine dependence: Secondary | ICD-10-CM | POA: Diagnosis not present

## 2018-12-04 DIAGNOSIS — E1122 Type 2 diabetes mellitus with diabetic chronic kidney disease: Secondary | ICD-10-CM | POA: Diagnosis not present

## 2018-12-04 DIAGNOSIS — J189 Pneumonia, unspecified organism: Secondary | ICD-10-CM | POA: Diagnosis not present

## 2018-12-04 DIAGNOSIS — I214 Non-ST elevation (NSTEMI) myocardial infarction: Secondary | ICD-10-CM | POA: Diagnosis not present

## 2018-12-04 DIAGNOSIS — Z9181 History of falling: Secondary | ICD-10-CM | POA: Diagnosis not present

## 2018-12-04 DIAGNOSIS — N183 Chronic kidney disease, stage 3 (moderate): Secondary | ICD-10-CM | POA: Diagnosis not present

## 2018-12-04 DIAGNOSIS — K449 Diaphragmatic hernia without obstruction or gangrene: Secondary | ICD-10-CM | POA: Diagnosis not present

## 2018-12-04 DIAGNOSIS — I5032 Chronic diastolic (congestive) heart failure: Secondary | ICD-10-CM | POA: Diagnosis not present

## 2018-12-04 DIAGNOSIS — K269 Duodenal ulcer, unspecified as acute or chronic, without hemorrhage or perforation: Secondary | ICD-10-CM | POA: Diagnosis not present

## 2018-12-05 ENCOUNTER — Encounter: Payer: Medicare Other | Attending: Registered Nurse | Admitting: Physical Medicine & Rehabilitation

## 2018-12-05 ENCOUNTER — Other Ambulatory Visit: Payer: Self-pay

## 2018-12-05 ENCOUNTER — Encounter: Payer: Self-pay | Admitting: Physical Medicine & Rehabilitation

## 2018-12-05 DIAGNOSIS — R269 Unspecified abnormalities of gait and mobility: Secondary | ICD-10-CM | POA: Insufficient documentation

## 2018-12-05 DIAGNOSIS — Z8719 Personal history of other diseases of the digestive system: Secondary | ICD-10-CM | POA: Insufficient documentation

## 2018-12-05 DIAGNOSIS — E669 Obesity, unspecified: Secondary | ICD-10-CM | POA: Insufficient documentation

## 2018-12-05 DIAGNOSIS — D62 Acute posthemorrhagic anemia: Secondary | ICD-10-CM | POA: Insufficient documentation

## 2018-12-05 DIAGNOSIS — R5381 Other malaise: Secondary | ICD-10-CM | POA: Diagnosis not present

## 2018-12-05 DIAGNOSIS — E1169 Type 2 diabetes mellitus with other specified complication: Secondary | ICD-10-CM

## 2018-12-05 DIAGNOSIS — I4891 Unspecified atrial fibrillation: Secondary | ICD-10-CM

## 2018-12-05 DIAGNOSIS — Z8679 Personal history of other diseases of the circulatory system: Secondary | ICD-10-CM | POA: Insufficient documentation

## 2018-12-05 DIAGNOSIS — I5032 Chronic diastolic (congestive) heart failure: Secondary | ICD-10-CM

## 2018-12-05 DIAGNOSIS — N183 Chronic kidney disease, stage 3 (moderate): Secondary | ICD-10-CM | POA: Insufficient documentation

## 2018-12-05 HISTORY — DX: Unspecified abnormalities of gait and mobility: R26.9

## 2018-12-05 NOTE — Progress Notes (Signed)
Subjective:    Patient ID: Erik Morales, male    DOB: July 05, 1941, 78 y.o.   MRN: [id number]  TELEHEALTH NOTE  Due to national recommendations of social distancing due to COVID 19, an audio/video telehealth visit is felt to be most appropriate for this patient at this time.  See Chart message from today for the patient's consent to telehealth from [place].     I verified that I am speaking with the correct person using two identifiers.  Location of patient: Home Location of provider: Home Method of communication: Telephone Names of participants : Zorita Pang scheduling, Wenda Overland obtaining consent and vitals if available Established patient Time spent on call: 23 minutes.  HPI Male with history of T2DM, HTN presents for follow up debility for multiple medical reasons.   Last clinic visit 10/23/2018 by NP, notes reviewed. Granddaughter supplements history. Since that time, he states he has been staying hydrated.  He is checking his weights and notes some weight loss. He states he had labs drawn since discharge.  He saw Cards, notes reviewed, holding off on anticoag at this time due to GI bleed. He saw his PCP. His MASD has healed. He is taking Protonix daily at present. He believes he was told CBGs due not  need to be checked.  Therapies: 2/week DME: Shower seat Mobility: Cane at home, walker in community  Pain Inventory Average Pain 0 Pain Right Now 0 My pain is no pain  In the last 24 hours, has pain interfered with the following? General activity 0 Relation with others 0 Enjoyment of life 0 What TIME of day is your pain at its worst? no pain Sleep (in general) NA  Pain is worse with: no pain Pain improves with: no pain Relief from Meds: no pain  Mobility use a cane use a walker do you drive?  no  Needs to know when he can drive  Function retired I need assistance with the following:  household duties and shopping   Neuro/Psych tingling  Prior Studies Any changes since last visit?  no  Physicians involved in your care Any changes since last visit?  no   Family History  Problem Relation Age of Onset  . Diabetes Son    Social History   Socioeconomic History  . Marital status: Unknown    Spouse name: Not on file  . Number of children: Not on file  . Years of education: Not on file  . Highest education level: Not on file  Occupational History  . Not on file  Social Needs  . Financial resource strain: Not on file  . Food insecurity:    Worry: Not on file    Inability: Not on file  . Transportation needs:    Medical: Not on file    Non-medical: Not on file  Tobacco Use  . Smoking status: Former Research scientist (life sciences)  . Smokeless tobacco: Never Used  . Tobacco comment: Quit 1985  Substance and Sexual Activity  . Alcohol use: Never    Frequency: Never  . Drug use: Never  . Sexual activity: Not on file  Lifestyle  . Physical activity:    Days per week: Not on file    Minutes per session: Not on file  . Stress: Not on file  Relationships  . Social connections:    Talks on phone: Not on file    Gets together: Not on file    Attends religious service: Not on file  Active member of club or organization: Not on file    Attends meetings of clubs or organizations: Not on file    Relationship status: Not on file  Other Topics Concern  . Not on file  Social History Narrative  . Not on file   Past Surgical History:  Procedure Laterality Date  . ESOPHAGOGASTRODUODENOSCOPY (EGD) WITH PROPOFOL N/A 09/27/2018   Procedure: ESOPHAGOGASTRODUODENOSCOPY (EGD) WITH PROPOFOL;  Surgeon: Doran Stabler, MD;  Location: [place];  Service: Gastroenterology;  Laterality: N/A;  . HOT HEMOSTASIS N/A 09/27/2018   Procedure: HOT HEMOSTASIS (ARGON PLASMA COAGULATION/BICAP);  Surgeon: Doran Stabler, MD;  Location: [facility];  Service: Gastroenterology;  Laterality: N/A;  . SUBMUCOSAL INJECTION  09/27/2018    Procedure: SUBMUCOSAL INJECTION;  Surgeon: Doran Stabler, MD;  Location: [facility];  Service: Gastroenterology;;   Past Medical History:  Diagnosis Date  . DM2 (diabetes mellitus, type 2) ([place])   . HLD (hyperlipidemia)   . HTN (hypertension)    There were no vitals taken for this visit.  Opioid Risk Score:   Fall Risk Score:  `1  Depression screen PHQ 2/9  Depression screen [facility] 2/9 12/05/2018 10/23/2018  Decreased Interest 0 0  Down, Depressed, Hopeless 0 0  PHQ - 2 Score 0 0     Review of Systems  Constitutional: Negative.   HENT: Negative.   Eyes: Negative.   Respiratory: Negative.   Cardiovascular: Negative.   Gastrointestinal: Negative.   Endocrine: Negative.   Genitourinary: Negative.   Musculoskeletal: Negative.   Skin: Negative.   Neurological: Negative for weakness.       Some tingling after therapy  Hematological: Negative.   Psychiatric/Behavioral: Negative.   All other systems reviewed and are negative.      Objective:   Physical Exam  Gen: NAD. Pulm: Effort normal Neuro: Alert and oriented    Assessment & Plan:  Male with history of T2DM, HTN presents for follow up debility for multiple medical reasons.   1.  Debility  secondary to PNA, NSTEMI, GI bleed requiring transfusion             Continue therapies  2. GIB with melena:   No further episodes  Cont iron  Cont PPI daily  Educated on abstaining from NSAIDs- educated patient  3. A fib/A flutter:   Holding on anticoag due to GI bleed  Cont recs per Cards  4. Chronic diastolic CHF:   Weights improving per pt  5. T2DM:   Cont meds  Encouraged CBG monitoring to ensure appropriate control  6. Gait abnormality  Cont therapies  Cont walker/cane for safety

## 2018-12-08 DIAGNOSIS — K269 Duodenal ulcer, unspecified as acute or chronic, without hemorrhage or perforation: Secondary | ICD-10-CM | POA: Diagnosis not present

## 2018-12-08 DIAGNOSIS — Z87891 Personal history of nicotine dependence: Secondary | ICD-10-CM | POA: Diagnosis not present

## 2018-12-08 DIAGNOSIS — I214 Non-ST elevation (NSTEMI) myocardial infarction: Secondary | ICD-10-CM | POA: Diagnosis not present

## 2018-12-08 DIAGNOSIS — N390 Urinary tract infection, site not specified: Secondary | ICD-10-CM | POA: Diagnosis not present

## 2018-12-08 DIAGNOSIS — Z9181 History of falling: Secondary | ICD-10-CM | POA: Diagnosis not present

## 2018-12-08 DIAGNOSIS — N183 Chronic kidney disease, stage 3 (moderate): Secondary | ICD-10-CM | POA: Diagnosis not present

## 2018-12-08 DIAGNOSIS — I4891 Unspecified atrial fibrillation: Secondary | ICD-10-CM | POA: Diagnosis not present

## 2018-12-08 DIAGNOSIS — I5032 Chronic diastolic (congestive) heart failure: Secondary | ICD-10-CM | POA: Diagnosis not present

## 2018-12-08 DIAGNOSIS — E46 Unspecified protein-calorie malnutrition: Secondary | ICD-10-CM | POA: Diagnosis not present

## 2018-12-08 DIAGNOSIS — E1122 Type 2 diabetes mellitus with diabetic chronic kidney disease: Secondary | ICD-10-CM | POA: Diagnosis not present

## 2018-12-08 DIAGNOSIS — B9620 Unspecified Escherichia coli [E. coli] as the cause of diseases classified elsewhere: Secondary | ICD-10-CM | POA: Diagnosis not present

## 2018-12-08 DIAGNOSIS — K449 Diaphragmatic hernia without obstruction or gangrene: Secondary | ICD-10-CM | POA: Diagnosis not present

## 2018-12-08 DIAGNOSIS — J189 Pneumonia, unspecified organism: Secondary | ICD-10-CM | POA: Diagnosis not present

## 2018-12-08 DIAGNOSIS — I130 Hypertensive heart and chronic kidney disease with heart failure and stage 1 through stage 4 chronic kidney disease, or unspecified chronic kidney disease: Secondary | ICD-10-CM | POA: Diagnosis not present

## 2018-12-08 DIAGNOSIS — E785 Hyperlipidemia, unspecified: Secondary | ICD-10-CM | POA: Diagnosis not present

## 2018-12-09 DIAGNOSIS — I5032 Chronic diastolic (congestive) heart failure: Secondary | ICD-10-CM | POA: Diagnosis not present

## 2018-12-10 DIAGNOSIS — J189 Pneumonia, unspecified organism: Secondary | ICD-10-CM | POA: Diagnosis not present

## 2018-12-10 DIAGNOSIS — Z87891 Personal history of nicotine dependence: Secondary | ICD-10-CM | POA: Diagnosis not present

## 2018-12-10 DIAGNOSIS — I5032 Chronic diastolic (congestive) heart failure: Secondary | ICD-10-CM | POA: Diagnosis not present

## 2018-12-10 DIAGNOSIS — I130 Hypertensive heart and chronic kidney disease with heart failure and stage 1 through stage 4 chronic kidney disease, or unspecified chronic kidney disease: Secondary | ICD-10-CM | POA: Diagnosis not present

## 2018-12-10 DIAGNOSIS — E785 Hyperlipidemia, unspecified: Secondary | ICD-10-CM | POA: Diagnosis not present

## 2018-12-10 DIAGNOSIS — N183 Chronic kidney disease, stage 3 (moderate): Secondary | ICD-10-CM | POA: Diagnosis not present

## 2018-12-10 DIAGNOSIS — I214 Non-ST elevation (NSTEMI) myocardial infarction: Secondary | ICD-10-CM | POA: Diagnosis not present

## 2018-12-10 DIAGNOSIS — E1122 Type 2 diabetes mellitus with diabetic chronic kidney disease: Secondary | ICD-10-CM | POA: Diagnosis not present

## 2018-12-10 DIAGNOSIS — N390 Urinary tract infection, site not specified: Secondary | ICD-10-CM | POA: Diagnosis not present

## 2018-12-10 DIAGNOSIS — K269 Duodenal ulcer, unspecified as acute or chronic, without hemorrhage or perforation: Secondary | ICD-10-CM | POA: Diagnosis not present

## 2018-12-10 DIAGNOSIS — I4891 Unspecified atrial fibrillation: Secondary | ICD-10-CM | POA: Diagnosis not present

## 2018-12-10 DIAGNOSIS — K449 Diaphragmatic hernia without obstruction or gangrene: Secondary | ICD-10-CM | POA: Diagnosis not present

## 2018-12-10 DIAGNOSIS — E46 Unspecified protein-calorie malnutrition: Secondary | ICD-10-CM | POA: Diagnosis not present

## 2018-12-10 DIAGNOSIS — Z9181 History of falling: Secondary | ICD-10-CM | POA: Diagnosis not present

## 2018-12-10 DIAGNOSIS — B9620 Unspecified Escherichia coli [E. coli] as the cause of diseases classified elsewhere: Secondary | ICD-10-CM | POA: Diagnosis not present

## 2018-12-15 ENCOUNTER — Other Ambulatory Visit: Payer: Self-pay

## 2018-12-15 NOTE — Patient Outreach (Signed)
[facility]) Care Management  12/15/2018  MAHLIK LENN 01-09-1941 [id number]   Medication Adherence call to [name] patient did not answer patient is due on Lisinopril 20 mg and Pravastatin 20 mg under [place].   [facility] Management Direct Dial [phone]  Fax [phone] [email or web address]

## 2018-12-18 DIAGNOSIS — I214 Non-ST elevation (NSTEMI) myocardial infarction: Secondary | ICD-10-CM | POA: Diagnosis not present

## 2018-12-18 DIAGNOSIS — I5032 Chronic diastolic (congestive) heart failure: Secondary | ICD-10-CM | POA: Diagnosis not present

## 2018-12-18 DIAGNOSIS — I4891 Unspecified atrial fibrillation: Secondary | ICD-10-CM | POA: Diagnosis not present

## 2018-12-18 DIAGNOSIS — K269 Duodenal ulcer, unspecified as acute or chronic, without hemorrhage or perforation: Secondary | ICD-10-CM | POA: Diagnosis not present

## 2018-12-18 DIAGNOSIS — I4892 Unspecified atrial flutter: Secondary | ICD-10-CM | POA: Diagnosis not present

## 2018-12-18 DIAGNOSIS — I130 Hypertensive heart and chronic kidney disease with heart failure and stage 1 through stage 4 chronic kidney disease, or unspecified chronic kidney disease: Secondary | ICD-10-CM | POA: Diagnosis not present

## 2018-12-18 DIAGNOSIS — Z9181 History of falling: Secondary | ICD-10-CM | POA: Diagnosis not present

## 2018-12-18 DIAGNOSIS — E46 Unspecified protein-calorie malnutrition: Secondary | ICD-10-CM | POA: Diagnosis not present

## 2018-12-18 DIAGNOSIS — N183 Chronic kidney disease, stage 3 (moderate): Secondary | ICD-10-CM | POA: Diagnosis not present

## 2018-12-18 DIAGNOSIS — E1122 Type 2 diabetes mellitus with diabetic chronic kidney disease: Secondary | ICD-10-CM | POA: Diagnosis not present

## 2018-12-18 DIAGNOSIS — J189 Pneumonia, unspecified organism: Secondary | ICD-10-CM | POA: Diagnosis not present

## 2018-12-18 DIAGNOSIS — E785 Hyperlipidemia, unspecified: Secondary | ICD-10-CM | POA: Diagnosis not present

## 2018-12-18 DIAGNOSIS — Z87891 Personal history of nicotine dependence: Secondary | ICD-10-CM | POA: Diagnosis not present

## 2018-12-18 DIAGNOSIS — K449 Diaphragmatic hernia without obstruction or gangrene: Secondary | ICD-10-CM | POA: Diagnosis not present

## 2018-12-24 DIAGNOSIS — J189 Pneumonia, unspecified organism: Secondary | ICD-10-CM | POA: Diagnosis not present

## 2018-12-24 DIAGNOSIS — E785 Hyperlipidemia, unspecified: Secondary | ICD-10-CM | POA: Diagnosis not present

## 2018-12-24 DIAGNOSIS — K269 Duodenal ulcer, unspecified as acute or chronic, without hemorrhage or perforation: Secondary | ICD-10-CM | POA: Diagnosis not present

## 2018-12-24 DIAGNOSIS — E46 Unspecified protein-calorie malnutrition: Secondary | ICD-10-CM | POA: Diagnosis not present

## 2018-12-24 DIAGNOSIS — I5032 Chronic diastolic (congestive) heart failure: Secondary | ICD-10-CM | POA: Diagnosis not present

## 2018-12-24 DIAGNOSIS — K449 Diaphragmatic hernia without obstruction or gangrene: Secondary | ICD-10-CM | POA: Diagnosis not present

## 2018-12-24 DIAGNOSIS — I4892 Unspecified atrial flutter: Secondary | ICD-10-CM | POA: Diagnosis not present

## 2018-12-24 DIAGNOSIS — I4891 Unspecified atrial fibrillation: Secondary | ICD-10-CM | POA: Diagnosis not present

## 2018-12-24 DIAGNOSIS — Z87891 Personal history of nicotine dependence: Secondary | ICD-10-CM | POA: Diagnosis not present

## 2018-12-24 DIAGNOSIS — I130 Hypertensive heart and chronic kidney disease with heart failure and stage 1 through stage 4 chronic kidney disease, or unspecified chronic kidney disease: Secondary | ICD-10-CM | POA: Diagnosis not present

## 2018-12-24 DIAGNOSIS — N183 Chronic kidney disease, stage 3 (moderate): Secondary | ICD-10-CM | POA: Diagnosis not present

## 2018-12-24 DIAGNOSIS — I214 Non-ST elevation (NSTEMI) myocardial infarction: Secondary | ICD-10-CM | POA: Diagnosis not present

## 2018-12-24 DIAGNOSIS — Z9181 History of falling: Secondary | ICD-10-CM | POA: Diagnosis not present

## 2018-12-24 DIAGNOSIS — E1122 Type 2 diabetes mellitus with diabetic chronic kidney disease: Secondary | ICD-10-CM | POA: Diagnosis not present

## 2019-01-01 ENCOUNTER — Other Ambulatory Visit: Payer: Self-pay

## 2019-01-01 NOTE — Patient Outreach (Signed)
[facility]) Care Management  01/01/2019  KEAVON SENSING 12/15/1940 [id number]   Medication Adherence call to [name] patient did not answer patient is due on Lisinopril 20 mg under [place].   [facility] Management Direct Dial [phone]  Fax [phone] [email or web address]

## 2019-01-02 DIAGNOSIS — I4892 Unspecified atrial flutter: Secondary | ICD-10-CM | POA: Diagnosis not present

## 2019-01-02 DIAGNOSIS — I130 Hypertensive heart and chronic kidney disease with heart failure and stage 1 through stage 4 chronic kidney disease, or unspecified chronic kidney disease: Secondary | ICD-10-CM | POA: Diagnosis not present

## 2019-01-02 DIAGNOSIS — I214 Non-ST elevation (NSTEMI) myocardial infarction: Secondary | ICD-10-CM | POA: Diagnosis not present

## 2019-01-02 DIAGNOSIS — K269 Duodenal ulcer, unspecified as acute or chronic, without hemorrhage or perforation: Secondary | ICD-10-CM | POA: Diagnosis not present

## 2019-01-02 DIAGNOSIS — Z9181 History of falling: Secondary | ICD-10-CM | POA: Diagnosis not present

## 2019-01-02 DIAGNOSIS — J189 Pneumonia, unspecified organism: Secondary | ICD-10-CM | POA: Diagnosis not present

## 2019-01-02 DIAGNOSIS — E1122 Type 2 diabetes mellitus with diabetic chronic kidney disease: Secondary | ICD-10-CM | POA: Diagnosis not present

## 2019-01-02 DIAGNOSIS — I4891 Unspecified atrial fibrillation: Secondary | ICD-10-CM | POA: Diagnosis not present

## 2019-01-02 DIAGNOSIS — I5032 Chronic diastolic (congestive) heart failure: Secondary | ICD-10-CM | POA: Diagnosis not present

## 2019-01-02 DIAGNOSIS — K449 Diaphragmatic hernia without obstruction or gangrene: Secondary | ICD-10-CM | POA: Diagnosis not present

## 2019-01-02 DIAGNOSIS — E46 Unspecified protein-calorie malnutrition: Secondary | ICD-10-CM | POA: Diagnosis not present

## 2019-01-02 DIAGNOSIS — N183 Chronic kidney disease, stage 3 (moderate): Secondary | ICD-10-CM | POA: Diagnosis not present

## 2019-01-02 DIAGNOSIS — Z87891 Personal history of nicotine dependence: Secondary | ICD-10-CM | POA: Diagnosis not present

## 2019-01-02 DIAGNOSIS — E785 Hyperlipidemia, unspecified: Secondary | ICD-10-CM | POA: Diagnosis not present

## 2019-01-06 DIAGNOSIS — I4891 Unspecified atrial fibrillation: Secondary | ICD-10-CM | POA: Diagnosis not present

## 2019-01-06 DIAGNOSIS — E46 Unspecified protein-calorie malnutrition: Secondary | ICD-10-CM | POA: Diagnosis not present

## 2019-01-06 DIAGNOSIS — I214 Non-ST elevation (NSTEMI) myocardial infarction: Secondary | ICD-10-CM | POA: Diagnosis not present

## 2019-01-06 DIAGNOSIS — Z87891 Personal history of nicotine dependence: Secondary | ICD-10-CM | POA: Diagnosis not present

## 2019-01-06 DIAGNOSIS — E1122 Type 2 diabetes mellitus with diabetic chronic kidney disease: Secondary | ICD-10-CM | POA: Diagnosis not present

## 2019-01-06 DIAGNOSIS — E785 Hyperlipidemia, unspecified: Secondary | ICD-10-CM | POA: Diagnosis not present

## 2019-01-06 DIAGNOSIS — I5032 Chronic diastolic (congestive) heart failure: Secondary | ICD-10-CM | POA: Diagnosis not present

## 2019-01-06 DIAGNOSIS — I130 Hypertensive heart and chronic kidney disease with heart failure and stage 1 through stage 4 chronic kidney disease, or unspecified chronic kidney disease: Secondary | ICD-10-CM | POA: Diagnosis not present

## 2019-01-06 DIAGNOSIS — I4892 Unspecified atrial flutter: Secondary | ICD-10-CM | POA: Diagnosis not present

## 2019-01-06 DIAGNOSIS — K269 Duodenal ulcer, unspecified as acute or chronic, without hemorrhage or perforation: Secondary | ICD-10-CM | POA: Diagnosis not present

## 2019-01-06 DIAGNOSIS — Z9181 History of falling: Secondary | ICD-10-CM | POA: Diagnosis not present

## 2019-01-06 DIAGNOSIS — K449 Diaphragmatic hernia without obstruction or gangrene: Secondary | ICD-10-CM | POA: Diagnosis not present

## 2019-01-06 DIAGNOSIS — N183 Chronic kidney disease, stage 3 (moderate): Secondary | ICD-10-CM | POA: Diagnosis not present

## 2019-01-06 DIAGNOSIS — J189 Pneumonia, unspecified organism: Secondary | ICD-10-CM | POA: Diagnosis not present

## 2019-01-08 DIAGNOSIS — I5032 Chronic diastolic (congestive) heart failure: Secondary | ICD-10-CM | POA: Diagnosis not present

## 2019-01-14 ENCOUNTER — Telehealth: Payer: Self-pay | Admitting: Cardiology

## 2019-01-14 NOTE — Telephone Encounter (Signed)
Virtual Visit Pre-Appointment Phone Call  "(Name), I am calling you today to discuss your upcoming appointment. We are currently trying to limit exposure to the virus that causes COVID-19 by seeing patients at home rather than in the office."  1. "What is the BEST phone number to call the day of the visit?" - include this in appointment notes  2. Do you have or have access to (through a family member/friend) a smartphone with video capability that we can use for your visit?" a. If yes - list this number in appt notes as cell (if different from BEST phone #) and list the appointment type as a VIDEO visit in appointment notes b. If no - list the appointment type as a PHONE visit in appointment notes  3. Confirm consent - "In the setting of the current Covid19 crisis, you are scheduled for a (phone or video) visit with your provider on (date) at (time).  Just as we do with many in-office visits, in order for you to participate in this visit, we must obtain consent.  If you'd like, I can send this to your mychart (if signed up) or email for you to review.  Otherwise, I can obtain your verbal consent now.  All virtual visits are billed to your insurance company just like a normal visit would be.  By agreeing to a virtual visit, we'd like you to understand that the technology does not allow for your provider to perform an examination, and thus may limit your provider's ability to fully assess your condition. If your provider identifies any concerns that need to be evaluated in person, we will make arrangements to do so.  Finally, though the technology is pretty good, we cannot assure that it will always work on either your or our end, and in the setting of a video visit, we may have to convert it to a phone-only visit.  In either situation, we cannot ensure that we have a secure connection.  Are you willing to proceed?" STAFF: Did the patient verbally acknowledge consent to telehealth visit? Document  YES/NO here: YES  4. Advise patient to be prepared - "Two hours prior to your appointment, go ahead and check your blood pressure, pulse, oxygen saturation, and your weight (if you have the equipment to check those) and write them all down. When your visit starts, your provider will ask you for this information. If you have an Apple Watch or Kardia device, please plan to have heart rate information ready on the day of your appointment. Please have a pen and paper handy nearby the day of the visit as well."  5. Give patient instructions for MyChart download to smartphone OR Doximity/Doxy.me as below if video visit (depending on what platform provider is using)  6. Inform patient they will receive a phone call 15 minutes prior to their appointment time (may be from unknown caller ID) so they should be prepared to answer    TELEPHONE CALL NOTE  Erik Morales has been deemed a candidate for a follow-up tele-health visit to limit community exposure during the Covid-19 pandemic. I spoke with the patient via phone to ensure availability of phone/video source, confirm preferred email & phone number, and discuss instructions and expectations.  I reminded Erik Morales to be prepared with any vital sign and/or heart rhythm information that could potentially be obtained via home monitoring, at the time of his visit. I reminded Erik Morales to expect a phone call prior to  his visit.  Frederic Jericho 01/14/2019 2:13 PM   INSTRUCTIONS FOR DOWNLOADING THE MYCHART APP TO SMARTPHONE  - The patient must first make sure to have activated MyChart and know their login information - If Apple, go to [employer] and type in MyChart in the search bar and download the app. If Android, ask patient to go to [employer] and type in [place] in the search bar and download the app. The app is free but as with any other app downloads, their phone may require them to verify saved payment information or Apple/Android password.    - The patient will need to then log into the app with their MyChart username and password, and select [facility] as their healthcare provider to link the account. When it is time for your visit, go to the MyChart app, find appointments, and click Begin Video Visit. Be sure to Select Allow for your device to access the Microphone and Camera for your visit. You will then be connected, and your provider will be with you shortly.  **If they have any issues connecting, or need assistance please contact MyChart service desk [phone])**  **If using a computer, in order to ensure the best quality for their visit they will need to use either of the following Internet Browsers: [employer], or Google Chrome**  IF USING DOXIMITY or DOXY.ME - The patient will receive a link just prior to their visit by text.     FULL LENGTH CONSENT FOR TELE-HEALTH VISIT   I hereby voluntarily request, consent and authorize [facility] and its employed or contracted physicians, physician assistants, nurse practitioners or other licensed health care professionals (the Practitioner), to provide me with telemedicine health care services (the Services") as deemed necessary by the treating Practitioner. I acknowledge and consent to receive the Services by the Practitioner via telemedicine. I understand that the telemedicine visit will involve communicating with the Practitioner through live audiovisual communication technology and the disclosure of certain medical information by electronic transmission. I acknowledge that I have been given the opportunity to request an in-person assessment or other available alternative prior to the telemedicine visit and am voluntarily participating in the telemedicine visit.  I understand that I have the right to withhold or withdraw my consent to the use of telemedicine in the course of my care at any time, without affecting my right to future care or treatment, and that  the Practitioner or I may terminate the telemedicine visit at any time. I understand that I have the right to inspect all information obtained and/or recorded in the course of the telemedicine visit and may receive copies of available information for a reasonable fee.  I understand that some of the potential risks of receiving the Services via telemedicine include:   Delay or interruption in medical evaluation due to technological equipment failure or disruption;  Information transmitted may not be sufficient (e.g. poor resolution of images) to allow for appropriate medical decision making by the Practitioner; and/or   In rare instances, security protocols could fail, causing a breach of personal health information.  Furthermore, I acknowledge that it is my responsibility to provide information about my medical history, conditions and care that is complete and accurate to the best of my ability. I acknowledge that Practitioner's advice, recommendations, and/or decision may be based on factors not within their control, such as incomplete or inaccurate data provided by me or distortions of diagnostic images or specimens that may result from electronic transmissions.  I understand that the practice of medicine is not an exact science and that Practitioner makes no warranties or guarantees regarding treatment outcomes. I acknowledge that I will receive a copy of this consent concurrently upon execution via email to the email address I last provided but may also request a printed copy by calling the office of [facility].    I understand that my insurance will be billed for this visit.   I have read or had this consent read to me.  I understand the contents of this consent, which adequately explains the benefits and risks of the Services being provided via telemedicine.   I have been provided ample opportunity to ask questions regarding this consent and the Services and have had my questions answered to  my satisfaction.  I give my informed consent for the services to be provided through the use of telemedicine in my medical care  By participating in this telemedicine visit I agree to the above.

## 2019-01-19 ENCOUNTER — Encounter: Payer: Self-pay | Admitting: Cardiology

## 2019-01-19 ENCOUNTER — Other Ambulatory Visit: Payer: Self-pay

## 2019-01-19 ENCOUNTER — Telehealth (INDEPENDENT_AMBULATORY_CARE_PROVIDER_SITE_OTHER): Payer: Medicare Other | Admitting: Cardiology

## 2019-01-19 VITALS — Ht 69.5 in | Wt 190.0 lb

## 2019-01-19 DIAGNOSIS — I4891 Unspecified atrial fibrillation: Secondary | ICD-10-CM

## 2019-01-19 DIAGNOSIS — E1169 Type 2 diabetes mellitus with other specified complication: Secondary | ICD-10-CM

## 2019-01-19 DIAGNOSIS — I10 Essential (primary) hypertension: Secondary | ICD-10-CM

## 2019-01-19 DIAGNOSIS — Z1329 Encounter for screening for other suspected endocrine disorder: Secondary | ICD-10-CM

## 2019-01-19 DIAGNOSIS — E669 Obesity, unspecified: Secondary | ICD-10-CM

## 2019-01-19 DIAGNOSIS — N183 Chronic kidney disease, stage 3 unspecified: Secondary | ICD-10-CM

## 2019-01-19 DIAGNOSIS — Z13220 Encounter for screening for lipoid disorders: Secondary | ICD-10-CM

## 2019-01-19 DIAGNOSIS — K264 Chronic or unspecified duodenal ulcer with hemorrhage: Secondary | ICD-10-CM

## 2019-01-19 NOTE — Patient Instructions (Signed)
Medication Instructions:  Your physician recommends that you continue on your current medications as directed. Please refer to the Current Medication list given to you today.  If you need a refill on your cardiac medications before your next appointment, please call your pharmacy.   Lab work: You will need to come into office fasting for a TSH, lipid, hepatic function, CBC and BMP next week.You will receive information concerning IFOB during your office visit on 01/27/19.  If you have labs (blood work) drawn today and your tests are completely normal, you will receive your results only by: Marland Kitchen MyChart Message (if you have MyChart) OR . A paper copy in the mail If you have any lab test that is abnormal or we need to change your treatment, we will call you to review the results.  Testing/Procedures: You will have an EKG performed on 01/27/19 in our [place] office.  Follow-Up: At [facility], you and your health needs are our priority.  As part of our continuing mission to provide you with exceptional heart care, we have created designated Provider Care Teams.  These Care Teams include your primary Cardiologist (physician) and Advanced Practice Providers (APPs -  Physician Assistants and Nurse Practitioners) who all work together to provide you with the care you need, when you need it. You will need a follow up appointment in 1 weeks.

## 2019-01-19 NOTE — Addendum Note (Signed)
Addended by: Beckey Rutter on: 01/19/2019 10:11 AM   Modules accepted: Orders

## 2019-01-19 NOTE — Progress Notes (Signed)
Virtual Visit via Telephone Note   This visit type was conducted due to national recommendations for restrictions regarding the COVID-19 Pandemic (e.g. social distancing) in an effort to limit this patient's exposure and mitigate transmission in our community.  Due to his co-morbid illnesses, this patient is at least at moderate risk for complications without adequate follow up.  This format is felt to be most appropriate for this patient at this time.  The patient did not have access to video technology/had technical difficulties with video requiring transitioning to audio format only (telephone).  All issues noted in this document were discussed and addressed.  No physical exam could be performed with this format.  Please refer to the patient's chart for his  consent to telehealth for [facility].   Date:  01/19/2019   ID:  Erik Morales, DOB 07-Nov-1940, MRN [id number]  Patient Location: Home Provider Location: Home  PCP:  [name], MD  Cardiologist:  No primary care provider on file.  Electrophysiologist:  None   Evaluation Performed:  Follow-Up Visit  Chief Complaint: Atrial fibrillation  History of Present Illness:    Erik Morales is a 78 y.o. male with past medical history of essential hypertension, atrial fibrillation and GI bleeding.  He is not on anticoagulation because of GI bleeding.  He was admitted to [facility] for the same reason.  He has received transfusions in the past.  He denies any chest pain orthopnea or PND.  He takes care of activities of daily living and is ambulatory with a walker.  At the time of my evaluation, the patient is alert awake oriented and in no distress.  The patient does not have symptoms concerning for COVID-19 infection (fever, chills, cough, or new shortness of breath).    Past Medical History:  Diagnosis Date  . DM2 (diabetes mellitus, type 2) ([place])   . HLD (hyperlipidemia)   . HTN (hypertension)    Past Surgical History:   Procedure Laterality Date  . ESOPHAGOGASTRODUODENOSCOPY (EGD) WITH PROPOFOL N/A 09/27/2018   Procedure: ESOPHAGOGASTRODUODENOSCOPY (EGD) WITH PROPOFOL;  Surgeon: Doran Stabler, MD;  Location: [place];  Service: Gastroenterology;  Laterality: N/A;  . HOT HEMOSTASIS N/A 09/27/2018   Procedure: HOT HEMOSTASIS (ARGON PLASMA COAGULATION/BICAP);  Surgeon: Doran Stabler, MD;  Location: [place];  Service: Gastroenterology;  Laterality: N/A;  . SUBMUCOSAL INJECTION  09/27/2018   Procedure: SUBMUCOSAL INJECTION;  Surgeon: Doran Stabler, MD;  Location: [place];  Service: Gastroenterology;;     Current Meds  Medication Sig  . acetaminophen (TYLENOL) 500 MG tablet Take 500 mg by mouth every 6 (six) hours as needed for headache (pain).  Marland Kitchen amiodarone (PACERONE) 200 MG tablet Take 1 tablet (200 mg total) by mouth 2 (two) times daily.  . calcium elemental as carbonate (BARIATRIC TUMS ULTRA) 400 MG chewable tablet Chew 1,000 mg by mouth 3 (three) times daily as needed for heartburn (indigestion).  Marland Kitchen docusate sodium (COLACE) 100 MG capsule Take 1 capsule (100 mg total) by mouth 2 (two) times daily. (Patient taking differently: Take 100 mg by mouth 2 (two) times daily as needed. )  . fluticasone (FLONASE) 50 MCG/ACT nasal spray Place 2 sprays into both nostrils daily.  Marland Kitchen glimepiride (AMARYL) 1 MG tablet Take 1 tablet (1 mg total) by mouth daily with breakfast.  . iron polysaccharides (NIFEREX) 150 MG capsule Take 1 capsule (150 mg total) by mouth 2 times daily at 12 noon and 4 pm.  .  latanoprost (XALATAN) 0.005 % ophthalmic solution Place 1 drop into both eyes at bedtime.  . montelukast (SINGULAIR) 10 MG tablet Take 1 tablet (10 mg total) by mouth at bedtime as needed (allergies).  . Multiple Vitamin (MULTIVITAMIN WITH MINERALS) TABS tablet Take 1 tablet by mouth daily. Centrum Silver  . pantoprazole (PROTONIX) 40 MG tablet TAKE 1 TABLET BY MOUTH TWICE DAILY BEFORE MEAL(S)  . polyethylene  glycol (MIRALAX / GLYCOLAX) packet Take 17 g by mouth daily as needed for mild constipation.  . Vitamin D, Ergocalciferol, (DRISDOL) 1.25 MG (50000 UT) CAPS capsule Take 50,000 Units by mouth every Wednesday.     Allergies:   Patient has no known allergies.   Social History   Tobacco Use  . Smoking status: Former Research scientist (life sciences)  . Smokeless tobacco: Never Used  . Tobacco comment: Quit 1985  Substance Use Topics  . Alcohol use: Never    Frequency: Never  . Drug use: Never     Family Hx: The patient's family history includes Diabetes in his son.  ROS:   Please see the history of present illness.    As mentioned above All other systems reviewed and are negative.   Prior CV studies:   The following studies were reviewed today:  None  Labs/Other Tests and Data Reviewed:    EKG:  No ECG reviewed.  Recent Labs: 09/26/2018: TSH 0.430 10/02/2018: Magnesium 1.9 10/03/2018: ALT 22 10/09/2018: Hemoglobin 10.6; Platelets PLATELET CLUMPS NOTED ON SMEAR, UNABLE TO ESTIMATE 10/10/2018: BUN 33; Creatinine, Ser 1.85; Potassium 3.5; Sodium 140   Recent Lipid Panel Lab Results  Component Value Date/Time   CHOL 72 09/27/2018 02:34 AM   TRIG 134 09/27/2018 02:34 AM   HDL 19 (L) 09/27/2018 02:34 AM   CHOLHDL 3.8 09/27/2018 02:34 AM   LDLCALC 26 09/27/2018 02:34 AM    Wt Readings from Last 3 Encounters:  01/19/19 190 lb (86.2 kg)  10/23/18 232 lb (105.2 kg)  10/15/18 223 lb (101.2 kg)     Objective:    Vital Signs:  Ht 5' 9.5" (1.765 m)   Wt 190 lb (86.2 kg)   BMI 27.66 kg/m    VITAL SIGNS:  reviewed  ASSESSMENT & PLAN:    1. Atrial fibrillation: The patient appears to be asymptomatic.  He leads a fairly sedentary lifestyle.  He ambulates with a walker.  No chest pain orthopnea or PND.  He has no means of checking his vital signs.  He tells me that in the next couple of days he will get a blood pressure machine and keep a track of his vital signs.  I would like to get him back next  week for a EKG at her office.  This will help me determine the amiodarone dose.  We will request him to get his medications so that we make sure that he is on correct medications.  I would also like him to get a Chem-7 CBC liver lipid check and a TSH.  We will also give him an ifob to check stool for occult blood. 2. Patient had multiple questions which were answered to his satisfaction.  Again he is not on anticoagulation because of significant GI bleeding necessitating consideration.  COVID-19 Education: The signs and symptoms of COVID-19 were discussed with the patient and how to seek care for testing (follow up with PCP or arrange E-visit).  The importance of social distancing was discussed today.  Time:   Today, I have spent 12 minutes with the patient with telehealth  technology discussing the above problems.     Medication Adjustments/Labs and Tests Ordered: Current medicines are reviewed at length with the patient today.  Concerns regarding medicines are outlined above.   Tests Ordered: No orders of the defined types were placed in this encounter.   Medication Changes: No orders of the defined types were placed in this encounter.   Disposition:  Follow up in 3 month(s)  Signed, Jenean Lindau, MD  01/19/2019 9:52 AM    [place]

## 2019-01-23 ENCOUNTER — Other Ambulatory Visit: Payer: Self-pay

## 2019-01-23 NOTE — Patient Outreach (Signed)
[facility]) Care Management  01/23/2019  Erik Morales 03-Apr-1941 [id number]   Medication Adherence call to [name] [place] Identifiers Verify spoke with patient he is past due on Lisinopril 20 mg patient explain he is no longer taking this medication. [name] is showing past due under [place].   [place] Management Direct Dial [phone]  Fax [phone] [name]

## 2019-01-27 ENCOUNTER — Other Ambulatory Visit: Payer: Self-pay

## 2019-01-27 ENCOUNTER — Ambulatory Visit (INDEPENDENT_AMBULATORY_CARE_PROVIDER_SITE_OTHER): Payer: Medicare Other | Admitting: Cardiology

## 2019-01-27 DIAGNOSIS — I480 Paroxysmal atrial fibrillation: Secondary | ICD-10-CM

## 2019-01-27 DIAGNOSIS — I4891 Unspecified atrial fibrillation: Secondary | ICD-10-CM | POA: Diagnosis not present

## 2019-01-27 DIAGNOSIS — E1169 Type 2 diabetes mellitus with other specified complication: Secondary | ICD-10-CM | POA: Diagnosis not present

## 2019-01-27 DIAGNOSIS — I10 Essential (primary) hypertension: Secondary | ICD-10-CM

## 2019-01-27 DIAGNOSIS — N183 Chronic kidney disease, stage 3 (moderate): Secondary | ICD-10-CM | POA: Diagnosis not present

## 2019-01-27 DIAGNOSIS — E088 Diabetes mellitus due to underlying condition with unspecified complications: Secondary | ICD-10-CM

## 2019-01-27 HISTORY — DX: Paroxysmal atrial fibrillation: I48.0

## 2019-01-27 HISTORY — DX: Diabetes mellitus due to underlying condition with unspecified complications: E08.8

## 2019-01-27 MED ORDER — AMIODARONE HCL 200 MG PO TABS: 200.0000 mg | ORAL_TABLET | Freq: Every day | ORAL | 3 refills | Status: DC

## 2019-01-27 NOTE — Patient Instructions (Addendum)
Medication Instructions:  Your physician has recommended you make the following change in your medication:  DECREASE amiodarone 200 mg (1 tablet) once daily   If you need a refill on your cardiac medications before your next appointment, please call your pharmacy.   Lab work: You had labs drawn today. If you have labs (blood work) drawn today and your tests are completely normal, you will receive your results only by: Marland Kitchen MyChart Message (if you have MyChart) OR . A paper copy in the mail If you have any lab test that is abnormal or we need to change your treatment, we will call you to review the results.  Testing/Procedures: NONE  Follow-Up: At [facility], you and your health needs are our priority.  As part of our continuing mission to provide you with exceptional heart care, we have created designated Provider Care Teams.  These Care Teams include your primary Cardiologist (physician) and Advanced Practice Providers (APPs -  Physician Assistants and Nurse Practitioners) who all work together to provide you with the care you need, when you need it. You will need a follow up appointment in 3 weeks.

## 2019-01-27 NOTE — Progress Notes (Signed)
Cardiology Office Note:    Date:  01/27/2019   ID:  Erik Morales, DOB Aug 16, 1941, MRN [id number]  PCP:  [name], MD  Cardiologist:  Jenean Lindau, MD   Referring MD: [name], MD    ASSESSMENT:    1. PAF (paroxysmal atrial fibrillation) ([place])   2. Essential hypertension   3. Diabetes mellitus due to underlying condition with unspecified complications ([place])    PLAN:    In order of problems listed above:  1. Atrial fibrillation, paroxysmal:I discussed with the patient atrial fibrillation, disease process. Management and therapy including rate and rhythm control, anticoagulation benefits and potential risks were discussed extensively with the patient. Patient had multiple questions which were answered to patient's satisfaction.  He is not on anticoagulation because of gait issues.  I have decided to cut down his amiodarone to 200 mg daily and he will be seen in follow-up appointment in 3 weeks or earlier if he has any concerns. 2. Essential hypertension: His blood pressure stable   Medication Adjustments/Labs and Tests Ordered: Current medicines are reviewed at length with the patient today.  Concerns regarding medicines are outlined above.  No orders of the defined types were placed in this encounter.  Meds ordered this encounter  Medications  . amiodarone (PACERONE) 200 MG tablet    Sig: Take 1 tablet (200 mg total) by mouth daily.    Dispense:  60 tablet    Refill:  3     No chief complaint on file.    History of Present Illness:    Erik Morales is a 78 y.o. male.  He has paroxysmal atrial fibrillation and essential hypertension.  He denies any problems at this time and takes care of activities of daily living.  No chest pain orthopnea or PND.  He tells me that his palpitations have resolved significantly and he feels much better.  He is taking amiodarone 200 mg twice daily.  Past Medical History:  Diagnosis Date  . DM2 (diabetes mellitus, type 2) ([place])   .  HLD (hyperlipidemia)   . HTN (hypertension)     Past Surgical History:  Procedure Laterality Date  . ESOPHAGOGASTRODUODENOSCOPY (EGD) WITH PROPOFOL N/A 09/27/2018   Procedure: ESOPHAGOGASTRODUODENOSCOPY (EGD) WITH PROPOFOL;  Surgeon: Doran Stabler, MD;  Location: [place];  Service: Gastroenterology;  Laterality: N/A;  . HOT HEMOSTASIS N/A 09/27/2018   Procedure: HOT HEMOSTASIS (ARGON PLASMA COAGULATION/BICAP);  Surgeon: Doran Stabler, MD;  Location: [place];  Service: Gastroenterology;  Laterality: N/A;  . SUBMUCOSAL INJECTION  09/27/2018   Procedure: SUBMUCOSAL INJECTION;  Surgeon: Doran Stabler, MD;  Location: [place];  Service: Gastroenterology;;    Current Medications: No outpatient medications have been marked as taking for the 01/27/19 encounter (Office Visit) with Jaymere Alen, Reita Cliche, MD.     Allergies:   Patient has no known allergies.   Social History   Socioeconomic History  . Marital status: Unknown    Spouse name: Not on file  . Number of children: Not on file  . Years of education: Not on file  . Highest education level: Not on file  Occupational History  . Not on file  Social Needs  . Financial resource strain: Not on file  . Food insecurity:    Worry: Not on file    Inability: Not on file  . Transportation needs:    Medical: Not on file    Non-medical: Not on file  Tobacco Use  . Smoking  status: Former Research scientist (life sciences)  . Smokeless tobacco: Never Used  . Tobacco comment: Quit 1985  Substance and Sexual Activity  . Alcohol use: Never    Frequency: Never  . Drug use: Never  . Sexual activity: Not on file  Lifestyle  . Physical activity:    Days per week: Not on file    Minutes per session: Not on file  . Stress: Not on file  Relationships  . Social connections:    Talks on phone: Not on file    Gets together: Not on file    Attends religious service: Not on file    Active member of club or organization: Not on file    Attends meetings  of clubs or organizations: Not on file    Relationship status: Not on file  Other Topics Concern  . Not on file  Social History Narrative  . Not on file     Family History: The patient's family history includes Diabetes in his son.  ROS:   Please see the history of present illness.    All other systems reviewed and are negative.  EKGs/Labs/Other Studies Reviewed:    The following studies were reviewed today: EKG reveals sinus rhythm and nonspecific ST-T changes.   Recent Labs: 09/26/2018: TSH 0.430 10/02/2018: Magnesium 1.9 10/03/2018: ALT 22 10/09/2018: Hemoglobin 10.6; Platelets PLATELET CLUMPS NOTED ON SMEAR, UNABLE TO ESTIMATE 10/10/2018: BUN 33; Creatinine, Ser 1.85; Potassium 3.5; Sodium 140  Recent Lipid Panel    Component Value Date/Time   CHOL 72 09/27/2018 0234   TRIG 134 09/27/2018 0234   HDL 19 (L) 09/27/2018 0234   CHOLHDL 3.8 09/27/2018 0234   VLDL 27 09/27/2018 0234   LDLCALC 26 09/27/2018 0234    Physical Exam:    VS:  There were no vitals taken for this visit.    Wt Readings from Last 3 Encounters:  01/19/19 190 lb (86.2 kg)  10/23/18 232 lb (105.2 kg)  10/15/18 223 lb (101.2 kg)     GEN: Patient is in no acute distress HEENT: Normal NECK: No JVD; No carotid bruits LYMPHATICS: No lymphadenopathy CARDIAC: Hear sounds regular, 2/6 systolic murmur at the apex. RESPIRATORY:  Clear to auscultation without rales, wheezing or rhonchi  ABDOMEN: Soft, non-tender, non-distended MUSCULOSKELETAL:  No edema; No deformity  SKIN: Warm and dry NEUROLOGIC:  Alert and oriented x 3 PSYCHIATRIC:  Normal affect   Signed, Jenean Lindau, MD  01/27/2019 1:51 PM    [facility]

## 2019-01-28 ENCOUNTER — Telehealth: Payer: Self-pay

## 2019-01-28 LAB — BASIC METABOLIC PANEL
BUN/Creatinine Ratio: 11 (ref 10–24)
BUN: 22 mg/dL (ref 8–27)
CO2: 20 mmol/L (ref 20–29)
Calcium: 9.2 mg/dL (ref 8.6–10.2)
Chloride: 106 mmol/L (ref 96–106)
Creatinine, Ser: 2.09 mg/dL — ABNORMAL HIGH (ref 0.76–1.27)
GFR calc Af Amer: 34 mL/min/{1.73_m2} — ABNORMAL LOW (ref >59)
GFR calc non Af Amer: 29 mL/min/{1.73_m2} — ABNORMAL LOW (ref >59)
Glucose: 99 mg/dL (ref 65–99)
Potassium: 4 mmol/L (ref 3.5–5.2)
Sodium: 143 mmol/L (ref 134–144)

## 2019-01-28 LAB — HEPATIC FUNCTION PANEL
ALT: 17 IU/L (ref 0–44)
AST: 22 IU/L (ref 0–40)
Albumin: 4.4 g/dL (ref 3.7–4.7)
Alkaline Phosphatase: 103 IU/L (ref 39–117)
Bilirubin Total: 0.4 mg/dL (ref 0.0–1.2)
Bilirubin, Direct: 0.16 mg/dL (ref 0.00–0.40)
Total Protein: 6.5 g/dL (ref 6.0–8.5)

## 2019-01-28 LAB — LIPID PANEL
Chol/HDL Ratio: 3.4 ratio (ref 0.0–5.0)
Cholesterol, Total: 171 mg/dL (ref 100–199)
HDL: 51 mg/dL (ref >39)
LDL Calculated: 97 mg/dL (ref 0–99)
Triglycerides: 115 mg/dL (ref 0–149)
VLDL Cholesterol Cal: 23 mg/dL (ref 5–40)

## 2019-01-28 LAB — CBC
Hematocrit: 43.1 % (ref 37.5–51.0)
Hemoglobin: 14.4 g/dL (ref 13.0–17.7)
MCH: 29.4 pg (ref 26.6–33.0)
MCHC: 33.4 g/dL (ref 31.5–35.7)
MCV: 88 fL (ref 79–97)
Platelets: 232 10*3/uL (ref 150–450)
RBC: 4.9 x10E6/uL (ref 4.14–5.80)
RDW: 14.3 % (ref 11.6–15.4)
WBC: 8.7 10*3/uL (ref 3.4–10.8)

## 2019-01-28 LAB — TSH: TSH: 2.05 u[IU]/mL (ref 0.450–4.500)

## 2019-01-28 NOTE — Telephone Encounter (Signed)
Patient called and notified of lab results. 

## 2019-01-28 NOTE — Telephone Encounter (Signed)
-----   Message from Jenean Lindau, MD sent at 01/28/2019  8:21 AM EDT ----- The results of the study is unremarkable.  Stable renal function and liver and lipid is fine.  Please inform patient. I will discuss in detail at next appointment. Cc  primary care/referring physician Jenean Lindau, MD 01/28/2019 8:21 AM

## 2019-01-31 LAB — FECAL OCCULT BLOOD, IMMUNOCHEMICAL: Fecal Occult Bld: NEGATIVE

## 2019-02-03 ENCOUNTER — Telehealth: Payer: Self-pay

## 2019-02-03 NOTE — Telephone Encounter (Signed)
-----   Message from Jenean Lindau, MD sent at 02/02/2019  8:05 AM EDT ----- Negative for occult blood ----- Message ----- From: Interface, [facility] Lab Results In Sent: 01/28/2019   5:38 AM EDT To: Jenean Lindau, MD

## 2019-02-03 NOTE — Telephone Encounter (Signed)
Patient informed no further questions at this time.

## 2019-02-04 ENCOUNTER — Encounter: Payer: Medicare Other | Attending: Registered Nurse | Admitting: Physical Medicine & Rehabilitation

## 2019-02-04 ENCOUNTER — Other Ambulatory Visit: Payer: Self-pay

## 2019-02-04 ENCOUNTER — Encounter: Payer: Self-pay | Admitting: Physical Medicine & Rehabilitation

## 2019-02-04 VITALS — BP 146/85 | HR 64 | Temp 98.7°F | Ht 68.5 in | Wt 175.0 lb

## 2019-02-04 DIAGNOSIS — E669 Obesity, unspecified: Secondary | ICD-10-CM | POA: Diagnosis present

## 2019-02-04 DIAGNOSIS — D62 Acute posthemorrhagic anemia: Secondary | ICD-10-CM | POA: Diagnosis not present

## 2019-02-04 DIAGNOSIS — N183 Chronic kidney disease, stage 3 (moderate): Secondary | ICD-10-CM | POA: Insufficient documentation

## 2019-02-04 DIAGNOSIS — E1169 Type 2 diabetes mellitus with other specified complication: Secondary | ICD-10-CM | POA: Diagnosis not present

## 2019-02-04 DIAGNOSIS — Z8679 Personal history of other diseases of the circulatory system: Secondary | ICD-10-CM | POA: Insufficient documentation

## 2019-02-04 DIAGNOSIS — Z8719 Personal history of other diseases of the digestive system: Secondary | ICD-10-CM

## 2019-02-04 DIAGNOSIS — R5381 Other malaise: Secondary | ICD-10-CM | POA: Insufficient documentation

## 2019-02-04 DIAGNOSIS — R269 Unspecified abnormalities of gait and mobility: Secondary | ICD-10-CM

## 2019-02-04 DIAGNOSIS — I5032 Chronic diastolic (congestive) heart failure: Secondary | ICD-10-CM

## 2019-02-04 NOTE — Progress Notes (Addendum)
Subjective:    Patient ID: Erik Morales, male    DOB: 1941/07/22, 78 y.o.   MRN: [id number]   HPI Male with history of T2DM, HTN presents for follow up debility for multiple medical reasons.   Last clinic visit 12/05/2018.  Relatively poor historian. Since that time, he was seen by Cards, notes reviewed. He has completed therapies. States he has been doing HEP.  He denies reoccurrence of GI bleed. He states he has "lost a lot of weight", he has been trying to eat a healthier diet. He notes CBGs have been relatively controlled. Denies falls. Using walker and cane.  Pain Inventory Average Pain 0 Pain Right Now 0 My pain is no pain  In the last 24 hours, has pain interfered with the following? General activity 0 Relation with others 0 Enjoyment of life 0 What TIME of day is your pain at its worst? no pain Sleep (in general) NA  Pain is worse with: no pain Pain improves with: no pain Relief from Meds: no pain  Mobility use a walker do you drive?  yes  Needs to know when he can drive  Function retired I need assistance with the following:  household duties and shopping  Neuro/Psych tingling  Prior Studies Any changes since last visit?  no  Physicians involved in your care Any changes since last visit?  no   Family History  Problem Relation Age of Onset  . Diabetes Son    Social History   Socioeconomic History  . Marital status: Unknown    Spouse name: Not on file  . Number of children: Not on file  . Years of education: Not on file  . Highest education level: Not on file  Occupational History  . Not on file  Social Needs  . Financial resource strain: Not on file  . Food insecurity    Worry: Not on file    Inability: Not on file  . Transportation needs    Medical: Not on file    Non-medical: Not on file  Tobacco Use  . Smoking status: Former Research scientist (life sciences)  . Smokeless tobacco: Never Used  . Tobacco comment: Quit 1985  Substance and Sexual Activity  . Alcohol  use: Never    Frequency: Never  . Drug use: Never  . Sexual activity: Not on file  Lifestyle  . Physical activity    Days per week: Not on file    Minutes per session: Not on file  . Stress: Not on file  Relationships  . Social Herbalist on phone: Not on file    Gets together: Not on file    Attends religious service: Not on file    Active member of club or organization: Not on file    Attends meetings of clubs or organizations: Not on file    Relationship status: Not on file  Other Topics Concern  . Not on file  Social History Narrative  . Not on file   Past Surgical History:  Procedure Laterality Date  . ESOPHAGOGASTRODUODENOSCOPY (EGD) WITH PROPOFOL N/A 09/27/2018   Procedure: ESOPHAGOGASTRODUODENOSCOPY (EGD) WITH PROPOFOL;  Surgeon: Doran Stabler, MD;  Location: [place];  Service: Gastroenterology;  Laterality: N/A;  . HOT HEMOSTASIS N/A 09/27/2018   Procedure: HOT HEMOSTASIS (ARGON PLASMA COAGULATION/BICAP);  Surgeon: Doran Stabler, MD;  Location: [place];  Service: Gastroenterology;  Laterality: N/A;  . SUBMUCOSAL INJECTION  09/27/2018   Procedure: SUBMUCOSAL INJECTION;  Surgeon: Wilfrid Lund  L III, MD;  Location: [place] [facility];  Service: Gastroenterology;;   Past Medical History:  Diagnosis Date  . DM2 (diabetes mellitus, type 2) ([place])   . HLD (hyperlipidemia)   . HTN (hypertension)    BP (!) 146/85   Pulse 64   Temp 98.7 F (37.1 C)   Ht 5' 8.5" (1.74 m)   Wt 175 lb (79.4 kg)   SpO2 97%   BMI 26.22 kg/m   Opioid Risk Score:   Fall Risk Score:  `1  Depression screen PHQ 2/9  Depression screen [facility] 2/9 12/05/2018 10/23/2018  Decreased Interest 0 0  Down, Depressed, Hopeless 0 0  PHQ - 2 Score 0 0     Review of Systems  Constitutional: Negative.   HENT: Negative.   Eyes: Negative.   Respiratory: Negative.   Cardiovascular: Negative.   Gastrointestinal: Negative.   Endocrine: Negative.   Genitourinary: Negative.    Musculoskeletal: Positive for gait problem.  Skin: Negative.   Neurological:       Some tingling after therapy  Hematological: Negative.   Psychiatric/Behavioral: Negative.   All other systems reviewed and are negative.      Objective:   Physical Exam  Constitutional: No distress . Vital signs reviewed. HENT: Normocephalic.  Atraumatic. Eyes: EOMI. No discharge. Cardiovascular: RRR. No JVD. Respiratory: CTA bilaterally. Normal effort. GI: BS +. Non-distended. Musc: No edema or tenderness in extremities Gait: Kyphotic posture  Neurological: He is alertand oriented Motor: Grossly 4+/5 throughout Skin: Warm and dry.  Intact. Psych: Normal mood. Normal affect.    Assessment & Plan:  Male with history of T2DM, HTN presents for follow up debility for multiple medical reasons.   1.  Debility  secondary to PNA, NSTEMI, GI bleed requiring transfusion             Completed therapies, continue HEP  2. GIB with melena:   No further episodes per patient   Cont iron  Cont PPI daily  Educated on abstaining from NSAIDs- educated patient previously  3. A fib/A flutter/Chronic diastolic CHF:   Holding on anticoag due to GI bleed  Cont further workup/recs per Cards  4. Gait abnormality  Cont therapies  Cont walker/cane for safety  Encouraged upright ambulation  Will consider therapies on next visit

## 2019-02-06 DIAGNOSIS — E785 Hyperlipidemia, unspecified: Secondary | ICD-10-CM | POA: Diagnosis not present

## 2019-02-06 DIAGNOSIS — R609 Edema, unspecified: Secondary | ICD-10-CM | POA: Diagnosis not present

## 2019-02-06 DIAGNOSIS — E1121 Type 2 diabetes mellitus with diabetic nephropathy: Secondary | ICD-10-CM | POA: Diagnosis not present

## 2019-02-06 DIAGNOSIS — I10 Essential (primary) hypertension: Secondary | ICD-10-CM | POA: Diagnosis not present

## 2019-02-06 DIAGNOSIS — J309 Allergic rhinitis, unspecified: Secondary | ICD-10-CM | POA: Diagnosis not present

## 2019-02-08 DIAGNOSIS — I5032 Chronic diastolic (congestive) heart failure: Secondary | ICD-10-CM | POA: Diagnosis not present

## 2019-02-17 ENCOUNTER — Telehealth (INDEPENDENT_AMBULATORY_CARE_PROVIDER_SITE_OTHER): Payer: Medicare Other | Admitting: Cardiology

## 2019-02-17 ENCOUNTER — Other Ambulatory Visit: Payer: Self-pay

## 2019-02-17 ENCOUNTER — Encounter: Payer: Self-pay | Admitting: Cardiology

## 2019-02-17 VITALS — BP 147/79 | HR 58 | Ht 68.5 in | Wt 220.0 lb

## 2019-02-17 DIAGNOSIS — I480 Paroxysmal atrial fibrillation: Secondary | ICD-10-CM

## 2019-02-17 DIAGNOSIS — N183 Chronic kidney disease, stage 3 unspecified: Secondary | ICD-10-CM

## 2019-02-17 DIAGNOSIS — I5032 Chronic diastolic (congestive) heart failure: Secondary | ICD-10-CM | POA: Diagnosis not present

## 2019-02-17 DIAGNOSIS — I10 Essential (primary) hypertension: Secondary | ICD-10-CM | POA: Diagnosis not present

## 2019-02-17 DIAGNOSIS — E088 Diabetes mellitus due to underlying condition with unspecified complications: Secondary | ICD-10-CM | POA: Diagnosis not present

## 2019-02-17 DIAGNOSIS — Z8719 Personal history of other diseases of the digestive system: Secondary | ICD-10-CM

## 2019-02-17 DIAGNOSIS — R269 Unspecified abnormalities of gait and mobility: Secondary | ICD-10-CM

## 2019-02-17 MED ORDER — AMIODARONE HCL 200 MG PO TABS: 100.0000 mg | ORAL_TABLET | Freq: Every day | ORAL | 3 refills | Status: DC

## 2019-02-17 NOTE — Patient Instructions (Signed)
Medication Instructions:  Your physician has recommended you make the following change in your medication:  DECREASE amiodarone to 100 mg (0.5 tablet) once a day   If you need a refill on your cardiac medications before your next appointment, please call your pharmacy.   Lab work: NONE If you have labs (blood work) drawn today and your tests are completely normal, you will receive your results only by: Marland Kitchen MyChart Message (if you have MyChart) OR . A paper copy in the mail If you have any lab test that is abnormal or we need to change your treatment, we will call you to review the results.  Testing/Procedures:   Follow-Up: At [facility], you and your health needs are our priority.  As part of our continuing mission to provide you with exceptional heart care, we have created designated Provider Care Teams.  These Care Teams include your primary Cardiologist (physician) and Advanced Practice Providers (APPs -  Physician Assistants and Nurse Practitioners) who all work together to provide you with the care you need, when you need it. You will need a follow up appointment in 4 months.

## 2019-02-17 NOTE — Addendum Note (Signed)
Addended by: Beckey Rutter on: 02/17/2019 12:32 PM   Modules accepted: Orders

## 2019-02-17 NOTE — Progress Notes (Signed)
Virtual Visit via Telephone Note   This visit type was conducted due to national recommendations for restrictions regarding the COVID-19 Pandemic (e.g. social distancing) in an effort to limit this patient's exposure and mitigate transmission in our community.  Due to his co-morbid illnesses, this patient is at least at moderate risk for complications without adequate follow up.  This format is felt to be most appropriate for this patient at this time.  The patient did not have access to video technology/had technical difficulties with video requiring transitioning to audio format only (telephone).  All issues noted in this document were discussed and addressed.  No physical exam could be performed with this format.  Please refer to the patient's chart for his  consent to telehealth for [facility].   Date:  02/17/2019   ID:  Erik Morales, DOB [birth year], MRN [id number]  Patient Location: Home Provider Location: Home  PCP:  [name], MD  Cardiologist:  No primary care provider on file.  Electrophysiologist:  None   Evaluation Performed:  Follow-Up Visit  Chief Complaint: Paroxysmal atrial fibrillation  History of Present Illness:    Erik Morales is a 78 y.o. male with past medical history of paroxysmal atrial fibrillation, essential hypertension and dyslipidemia.  He has had a history of gastrointestinal bleeding in the past and unstable gait.  He denies any problems at this time and takes care of activities of daily living.  No chest pain orthopnea or PND.  He ambulates with a walker and tells me that his gait is unstable.  At the time of my evaluation, the patient is alert awake oriented and in no distress.  The patient does not have symptoms concerning for COVID-19 infection (fever, chills, cough, or new shortness of breath).    Past Medical History:  Diagnosis Date  . DM2 (diabetes mellitus, type 2) ([place])   . HLD (hyperlipidemia)   . HTN (hypertension)    Past Surgical  History:  Procedure Laterality Date  . ESOPHAGOGASTRODUODENOSCOPY (EGD) WITH PROPOFOL N/A 09/27/2018   Procedure: ESOPHAGOGASTRODUODENOSCOPY (EGD) WITH PROPOFOL;  Surgeon: Doran Stabler, MD;  Location: [place];  Service: Gastroenterology;  Laterality: N/A;  . HOT HEMOSTASIS N/A 09/27/2018   Procedure: HOT HEMOSTASIS (ARGON PLASMA COAGULATION/BICAP);  Surgeon: Doran Stabler, MD;  Location: [place];  Service: Gastroenterology;  Laterality: N/A;  . SUBMUCOSAL INJECTION  09/27/2018   Procedure: SUBMUCOSAL INJECTION;  Surgeon: Doran Stabler, MD;  Location: [place];  Service: Gastroenterology;;     Current Meds  Medication Sig  . acetaminophen (TYLENOL) 500 MG tablet Take 500 mg by mouth every 6 (six) hours as needed for headache (pain).  Marland Kitchen albuterol (VENTOLIN HFA) 108 (90 Base) MCG/ACT inhaler   . amiodarone (PACERONE) 200 MG tablet Take 1 tablet (200 mg total) by mouth daily.  Marland Kitchen atenolol (TENORMIN) 100 MG tablet   . docusate sodium (COLACE) 100 MG capsule Take 1 capsule (100 mg total) by mouth 2 (two) times daily. (Patient taking differently: Take 100 mg by mouth 2 (two) times daily as needed. )  . fluticasone (FLONASE) 50 MCG/ACT nasal spray Place 2 sprays into both nostrils daily.  . furosemide (LASIX) 20 MG tablet   . glimepiride (AMARYL) 1 MG tablet Take 1 tablet (1 mg total) by mouth daily with breakfast.  . iron polysaccharides (NIFEREX) 150 MG capsule Take 1 capsule (150 mg total) by mouth 2 times daily at 12 noon and 4 pm.  . latanoprost (XALATAN)  0.005 % ophthalmic solution Place 1 drop into both eyes at bedtime.  . montelukast (SINGULAIR) 10 MG tablet Take 1 tablet (10 mg total) by mouth at bedtime as needed (allergies).  . Multiple Vitamin (MULTIVITAMIN WITH MINERALS) TABS tablet Take 1 tablet by mouth daily. Centrum Silver  . polyethylene glycol (MIRALAX / GLYCOLAX) packet Take 17 g by mouth daily as needed for mild constipation.  . Vitamin D, Ergocalciferol,  (DRISDOL) 1.25 MG (50000 UT) CAPS capsule Take 50,000 Units by mouth every Wednesday.     Allergies:   Patient has no known allergies.   Social History   Tobacco Use  . Smoking status: Former Research scientist (life sciences)  . Smokeless tobacco: Never Used  . Tobacco comment: Quit 1985  Substance Use Topics  . Alcohol use: Never    Frequency: Never  . Drug use: Never     Family Hx: The patient's family history includes Diabetes in his son.  ROS:   Please see the history of present illness.    As mentioned above All other systems reviewed and are negative.   Prior CV studies:   The following studies were reviewed today:   IMPRESSIONS    1. The left ventricle has normal systolic function of 09-32%. The cavity size was normal. There is mildly increased left ventricular wall thickness. Echo evidence of impaired diastolic relaxation.  2. The right ventricle has normal systolic function. The cavity was normal. There is no increase in right ventricular wall thickness.  3. Left atrial size was mildly dilated.  4. The mitral valve is normal in structure.  5. The tricuspid valve is normal in structure.  6. The aortic valve is normal in structure. There is mild thickening and mild calcification of the aortic valve.  7. The pulmonic valve was normal in structure.  8. The aortic root and ascending aorta are normal in size and structure.  Labs/Other Tests and Data Reviewed:    EKG:  No ECG reviewed.  Recent Labs: 10/02/2018: Magnesium 1.9 01/27/2019: ALT 17; BUN 22; Creatinine, Ser 2.09; Hemoglobin 14.4; Platelets 232; Potassium 4.0; Sodium 143; TSH 2.050   Recent Lipid Panel Lab Results  Component Value Date/Time   CHOL 171 01/27/2019 10:37 AM   TRIG 115 01/27/2019 10:37 AM   HDL 51 01/27/2019 10:37 AM   CHOLHDL 3.4 01/27/2019 10:37 AM   CHOLHDL 3.8 09/27/2018 02:34 AM   LDLCALC 97 01/27/2019 10:37 AM    Wt Readings from Last 3 Encounters:  02/17/19 220 lb (99.8 kg)  02/04/19 175 lb (79.4  kg)  01/19/19 190 lb (86.2 kg)     Objective:    Vital Signs:  BP (!) 147/79 (BP Location: Left Arm, Patient Position: Sitting, Cuff Size: Normal)   Pulse (!) 58   Ht 5' 8.5" (1.74 m)   Wt 220 lb (99.8 kg)   BMI 32.96 kg/m    VITAL SIGNS:  reviewed  ASSESSMENT & PLAN:    1. Paroxysmal atrial fibrillation: I discussed my findings with the patient at extensive length.  Clinically he appears to be now in sinus rhythm.  He is not on anticoagulation because of history of GI bleed and unstable gait.  I have also asked him to cut down his amiodarone to half tablet which is 100 mg daily and he agrees.  Recent blood work including chemistries and liver tests were reviewed with the patient and they were unremarkable.  We will send a copy of this to his primary care physician.  Since he is  on amiodarone therapy I wanted to do pulmonary function testing but I will hold off because of the viral pandemic and his comorbidities. 2. Essential hypertension: His blood pressure stable 3. Diabetes mellitus: This is managed by his primary care physician 4. Patient will be seen in follow-up appointment in 4 months or earlier if the patient has any concerns   COVID-19 Education: The signs and symptoms of COVID-19 were discussed with the patient and how to seek care for testing (follow up with PCP or arrange E-visit).  The importance of social distancing was discussed today.  Time:   Today, I have spent 15 minutes with the patient with telehealth technology discussing the above problems.     Medication Adjustments/Labs and Tests Ordered: Current medicines are reviewed at length with the patient today.  Concerns regarding medicines are outlined above.   Tests Ordered: No orders of the defined types were placed in this encounter.   Medication Changes: No orders of the defined types were placed in this encounter.   Follow Up:  In Person in 4 month(s)  Signed, Jenean Lindau, MD  02/17/2019 11:16 AM     [place]

## 2019-03-10 DIAGNOSIS — I5032 Chronic diastolic (congestive) heart failure: Secondary | ICD-10-CM | POA: Diagnosis not present

## 2019-03-11 DIAGNOSIS — Z9181 History of falling: Secondary | ICD-10-CM | POA: Diagnosis not present

## 2019-03-11 DIAGNOSIS — Z0000 Encounter for general adult medical examination without abnormal findings: Secondary | ICD-10-CM | POA: Diagnosis not present

## 2019-03-11 DIAGNOSIS — E785 Hyperlipidemia, unspecified: Secondary | ICD-10-CM | POA: Diagnosis not present

## 2019-04-10 DIAGNOSIS — I5032 Chronic diastolic (congestive) heart failure: Secondary | ICD-10-CM | POA: Diagnosis not present

## 2019-05-11 DIAGNOSIS — I5032 Chronic diastolic (congestive) heart failure: Secondary | ICD-10-CM | POA: Diagnosis not present

## 2019-05-12 DIAGNOSIS — I10 Essential (primary) hypertension: Secondary | ICD-10-CM | POA: Diagnosis not present

## 2019-05-12 DIAGNOSIS — Z79899 Other long term (current) drug therapy: Secondary | ICD-10-CM | POA: Diagnosis not present

## 2019-05-12 DIAGNOSIS — E1121 Type 2 diabetes mellitus with diabetic nephropathy: Secondary | ICD-10-CM | POA: Diagnosis not present

## 2019-05-12 DIAGNOSIS — L98499 Non-pressure chronic ulcer of skin of other sites with unspecified severity: Secondary | ICD-10-CM | POA: Diagnosis not present

## 2019-05-12 DIAGNOSIS — E559 Vitamin D deficiency, unspecified: Secondary | ICD-10-CM | POA: Diagnosis not present

## 2019-05-12 DIAGNOSIS — E785 Hyperlipidemia, unspecified: Secondary | ICD-10-CM | POA: Diagnosis not present

## 2019-05-15 ENCOUNTER — Other Ambulatory Visit: Payer: Self-pay | Admitting: Podiatry

## 2019-05-15 DIAGNOSIS — M79671 Pain in right foot: Secondary | ICD-10-CM

## 2019-05-18 ENCOUNTER — Ambulatory Visit (INDEPENDENT_AMBULATORY_CARE_PROVIDER_SITE_OTHER): Payer: Medicare Other

## 2019-05-18 ENCOUNTER — Ambulatory Visit: Payer: Medicare Other | Admitting: Podiatry

## 2019-05-18 ENCOUNTER — Other Ambulatory Visit: Payer: Self-pay

## 2019-05-18 DIAGNOSIS — L97412 Non-pressure chronic ulcer of right heel and midfoot with fat layer exposed: Secondary | ICD-10-CM | POA: Diagnosis not present

## 2019-05-18 DIAGNOSIS — E1142 Type 2 diabetes mellitus with diabetic polyneuropathy: Secondary | ICD-10-CM | POA: Diagnosis not present

## 2019-05-18 DIAGNOSIS — M79671 Pain in right foot: Secondary | ICD-10-CM

## 2019-05-18 NOTE — Progress Notes (Signed)
Subjective:  Patient ID: Erik Morales, male    DOB: [birth year],  MRN: [id number]  Chief Complaint  Patient presents with  . Foot Ulcer    Lt bottom heel medial side lesion x 3-4 mo; no pain no injury -w/ open skin, redness and drainage -pt denies swelling/warmth -pt denies N/V/F/Ch Tx: bandaid and salve  . Diabetes    FBS: good A1C: na PCP: Uptin x 1 wk     78 y.o. male presents for wound care. Hx as above. Unsure how the area started. Using silvadene cream.   Review of Systems: Negative except as noted in the HPI. Denies N/V/F/Ch.  Past Medical History:  Diagnosis Date  . DM2 (diabetes mellitus, type 2) ([place])   . HLD (hyperlipidemia)   . HTN (hypertension)     Current Outpatient Medications:  .  acetaminophen (TYLENOL) 500 MG tablet, Take 500 mg by mouth every 6 (six) hours as needed for headache (pain)., Disp: , Rfl:  .  albuterol (VENTOLIN HFA) 108 (90 Base) MCG/ACT inhaler, , Disp: , Rfl:  .  amiodarone (PACERONE) 200 MG tablet, Take 0.5 tablets (100 mg total) by mouth daily., Disp: 60 tablet, Rfl: 3 .  atenolol (TENORMIN) 100 MG tablet, , Disp: , Rfl:  .  calcium elemental as carbonate (BARIATRIC TUMS ULTRA) 400 MG chewable tablet, Chew 1,000 mg by mouth 3 (three) times daily as needed for heartburn (indigestion)., Disp: , Rfl:  .  docusate sodium (COLACE) 100 MG capsule, Take 1 capsule (100 mg total) by mouth 2 (two) times daily. (Patient taking differently: Take 100 mg by mouth 2 (two) times daily as needed. ), Disp: 60 capsule, Rfl: 0 .  fluticasone (FLONASE) 50 MCG/ACT nasal spray, Place 2 sprays into both nostrils daily., Disp: , Rfl:  .  furosemide (LASIX) 20 MG tablet, , Disp: , Rfl:  .  glimepiride (AMARYL) 1 MG tablet, Take 1 tablet (1 mg total) by mouth daily with breakfast., Disp: 30 tablet, Rfl: 0 .  iron polysaccharides (NIFEREX) 150 MG capsule, Take 1 capsule (150 mg total) by mouth 2 times daily at 12 noon and 4 pm., Disp: 60 capsule, Rfl: 0 .  latanoprost  (XALATAN) 0.005 % ophthalmic solution, Place 1 drop into both eyes at bedtime., Disp: 2.5 mL, Rfl: 12 .  montelukast (SINGULAIR) 10 MG tablet, Take 1 tablet (10 mg total) by mouth at bedtime as needed (allergies)., Disp: 30 tablet, Rfl: 0 .  Multiple Vitamin (MULTIVITAMIN WITH MINERALS) TABS tablet, Take 1 tablet by mouth daily. Centrum Silver, Disp: , Rfl:  .  pantoprazole (PROTONIX) 40 MG tablet, TAKE 1 TABLET BY MOUTH TWICE DAILY BEFORE MEAL(S) (Patient not taking: Reported on 02/17/2019), Disp: 42 tablet, Rfl: 0 .  polyethylene glycol (MIRALAX / GLYCOLAX) packet, Take 17 g by mouth daily as needed for mild constipation., Disp: 14 each, Rfl: 0 .  Vitamin D, Ergocalciferol, (DRISDOL) 1.25 MG (50000 UT) CAPS capsule, Take 50,000 Units by mouth every Wednesday., Disp: , Rfl:   Social History   Tobacco Use  Smoking Status Former Smoker  Smokeless Tobacco Never Used  Tobacco Comment   Quit 1985    No Known Allergies Objective:  There were no vitals filed for this visit. There is no height or weight on file to calculate BMI. Constitutional Well developed. Well nourished.  Vascular Dorsalis pedis pulses palpable bilaterally. Posterior tibial pulses palpable bilaterally. Capillary refill normal to all digits.  No cyanosis or clubbing noted. Pedal hair growth normal.  Neurologic Normal  speech. Oriented to person, place, and time. Protective sensation absent  Dermatologic Wound Location: R heel Wound Base: Granular/Healthy Peri-wound: Calloused Exudate: None: wound tissue dry Wound Measurements: -1.5x1 post-debridement  Orthopedic: No pain to palpation either foot.   Radiographs: Taken and reviewed degenerative changes, multiple loose osteophytes, no evidence of osseous erosions or acute fractures. Assessment:   1. Skin ulcer of right heel with fat layer exposed ([place])   2. DM type 2 with diabetic peripheral neuropathy ([place])    Plan:  Patient was evaluated and treated and all  questions answered.  Ulcer right heel -Debridement as below. -Dressed with medihoney, DSD. -Continue off-loading with surgical shoe.  Procedure: Excisional Debridement of Wound Rationale: Removal of non-viable soft tissue from the wound to promote healing.  Anesthesia: none Pre-Debridement Wound Measurements: 1 cm x 1 cm x 0.2 cm  Post-Debridement Wound Measurements: 1.5 cm x 1 cm x 0.2 cm  Type of Debridement: Sharp Excisional Tissue Removed: Non-viable soft tissue Depth of Debridement: subcutaneous tissue. Technique: Sharp excisional debridement to bleeding, viable wound base.  Dressing: Dry, sterile, compression dressing. Disposition: Patient tolerated procedure well. Patient to return in 1 week for follow-up.  Return in about 2 weeks (around 06/01/2019) for Wound Care, Right.

## 2019-05-20 ENCOUNTER — Other Ambulatory Visit: Payer: Self-pay | Admitting: Podiatry

## 2019-05-20 DIAGNOSIS — L97412 Non-pressure chronic ulcer of right heel and midfoot with fat layer exposed: Secondary | ICD-10-CM

## 2019-05-20 DIAGNOSIS — M79671 Pain in right foot: Secondary | ICD-10-CM

## 2019-06-01 ENCOUNTER — Ambulatory Visit: Payer: Self-pay | Admitting: Podiatry

## 2019-06-01 ENCOUNTER — Other Ambulatory Visit: Payer: Self-pay

## 2019-06-01 DIAGNOSIS — L97412 Non-pressure chronic ulcer of right heel and midfoot with fat layer exposed: Secondary | ICD-10-CM

## 2019-06-10 DIAGNOSIS — I5032 Chronic diastolic (congestive) heart failure: Secondary | ICD-10-CM | POA: Diagnosis not present

## 2019-06-19 ENCOUNTER — Telehealth: Payer: Medicare Other | Admitting: Cardiology

## 2019-06-26 ENCOUNTER — Encounter: Payer: Self-pay | Admitting: Cardiology

## 2019-06-26 ENCOUNTER — Other Ambulatory Visit: Payer: Self-pay

## 2019-06-26 ENCOUNTER — Ambulatory Visit (INDEPENDENT_AMBULATORY_CARE_PROVIDER_SITE_OTHER): Payer: Medicare Other | Admitting: Cardiology

## 2019-06-26 VITALS — BP 134/70 | HR 50 | Ht 68.5 in | Wt 207.8 lb

## 2019-06-26 DIAGNOSIS — Z8719 Personal history of other diseases of the digestive system: Secondary | ICD-10-CM | POA: Diagnosis not present

## 2019-06-26 DIAGNOSIS — I10 Essential (primary) hypertension: Secondary | ICD-10-CM | POA: Diagnosis not present

## 2019-06-26 DIAGNOSIS — I480 Paroxysmal atrial fibrillation: Secondary | ICD-10-CM

## 2019-06-26 DIAGNOSIS — E088 Diabetes mellitus due to underlying condition with unspecified complications: Secondary | ICD-10-CM | POA: Diagnosis not present

## 2019-06-26 NOTE — Addendum Note (Signed)
Addended by: Beckey Rutter on: 06/26/2019 10:45 AM   Modules accepted: Orders

## 2019-06-26 NOTE — Progress Notes (Signed)
Cardiology Office Note:    Date:  06/26/2019   ID:  Erik Morales, DOB 02/02/1941, MRN [id number]  PCP:  [name], MD  Cardiologist:  Jenean Lindau, MD   Referring MD: [name], MD    ASSESSMENT:    1. PAF (paroxysmal atrial fibrillation) ([place])   2. History of GI bleed   3. Diabetes mellitus due to underlying condition with unspecified complications ([place])   4. Essential hypertension    PLAN:    In order of problems listed above:  1. Coronary artery disease and non-STEMI: It is time for [place] to evaluate this.  I discussed this with him at extensive length and we will do a Lexiscan sestamibi to understand how he is doing at this time.  Patient is agreeable on this. 2. Paroxysmal atrial fibrillation: He had an episode of atrial tachyarrhythmias while he was in the hospital.  He is not on anticoagulation because of history of GI bleed.  He is completely asymptomatic and has not had no issues.  I would like to evaluate him at this time for this issue with a 1 month monitoring. 3. Essential hypertension and mixed dyslipidemia: Diet was discussed.  Importance of regular exercise stressed 4. Diabetes mellitus: This is managed by his primary care physician.  I reviewed recent lab work and renal function also and they were satisfactory. 5. Patient will be seen in follow-up appointment in 3 months or earlier if the patient has any concerns    Medication Adjustments/Labs and Tests Ordered: Current medicines are reviewed at length with the patient today.  Concerns regarding medicines are outlined above.  No orders of the defined types were placed in this encounter.  No orders of the defined types were placed in this encounter.    No chief complaint on file.    History of Present Illness:    Erik Morales is a 78 y.o. male.  Patient has past medical history of essential hypertension and diabetes mellitus.  He has had a non-STEMI in the month of February and at that time he had GI  bleeding.  He was also found to have an episode of atrial fibrillation at that time.  He denies any chest pain orthopnea or PND.  He takes care of activities of daily living.  No chest pain orthopnea or PND.  At the time of my evaluation, the patient is alert awake oriented and in no distress.  Past Medical History:  Diagnosis Date   DM2 (diabetes mellitus, type 2) ([place])    HLD (hyperlipidemia)    HTN (hypertension)     Past Surgical History:  Procedure Laterality Date   ESOPHAGOGASTRODUODENOSCOPY (EGD) WITH PROPOFOL N/A 09/27/2018   Procedure: ESOPHAGOGASTRODUODENOSCOPY (EGD) WITH PROPOFOL;  Surgeon: Doran Stabler, MD;  Location: [facility];  Service: Gastroenterology;  Laterality: N/A;   HOT HEMOSTASIS N/A 09/27/2018   Procedure: HOT HEMOSTASIS (ARGON PLASMA COAGULATION/BICAP);  Surgeon: Doran Stabler, MD;  Location: [facility];  Service: Gastroenterology;  Laterality: N/A;   SUBMUCOSAL INJECTION  09/27/2018   Procedure: SUBMUCOSAL INJECTION;  Surgeon: Doran Stabler, MD;  Location: [facility];  Service: Gastroenterology;;    Current Medications: Current Meds  Medication Sig   acetaminophen (TYLENOL) 500 MG tablet Take 500 mg by mouth every 6 (six) hours as needed for headache (pain).   albuterol (VENTOLIN HFA) 108 (90 Base) MCG/ACT inhaler Inhale 1 puff into the lungs every 6 (six) hours as needed.    amiodarone (PACERONE)  200 MG tablet Take 0.5 tablets (100 mg total) by mouth daily.   docusate sodium (COLACE) 100 MG capsule Take 1 capsule (100 mg total) by mouth 2 (two) times daily. (Patient taking differently: Take 100 mg by mouth 2 (two) times daily as needed. )   ferrous sulfate 325 (65 FE) MG tablet Take 325 mg by mouth daily with breakfast.   fluticasone (FLONASE) 50 MCG/ACT nasal spray Place 2 sprays into both nostrils daily.   furosemide (LASIX) 20 MG tablet Take 20 mg by mouth daily.    glimepiride (AMARYL) 1 MG tablet Take 1 tablet (1 mg total) by  mouth daily with breakfast.   latanoprost (XALATAN) 0.005 % ophthalmic solution Place 1 drop into both eyes at bedtime.   lisinopril (ZESTRIL) 20 MG tablet Take 1 tablet by mouth daily.   montelukast (SINGULAIR) 10 MG tablet Take 1 tablet (10 mg total) by mouth at bedtime as needed (allergies).   Multiple Vitamin (MULTIVITAMIN WITH MINERALS) TABS tablet Take 1 tablet by mouth daily. Centrum Silver   pantoprazole (PROTONIX) 20 MG tablet Take 20 mg by mouth daily as needed.   polyethylene glycol (MIRALAX / GLYCOLAX) packet Take 17 g by mouth daily as needed for mild constipation.   potassium chloride (KLOR-CON) 10 MEQ tablet Take 10 mEq by mouth daily.   Vitamin D, Ergocalciferol, (DRISDOL) 1.25 MG (50000 UT) CAPS capsule Take 50,000 Units by mouth every Wednesday.     Allergies:   Patient has no known allergies.   Social History   Socioeconomic History   Marital status: Unknown    Spouse name: Not on file   Number of children: Not on file   Years of education: Not on file   Highest education level: Not on file  Occupational History   Not on file  Social Needs   Financial resource strain: Not on file   Food insecurity    Worry: Not on file    Inability: Not on file   Transportation needs    Medical: Not on file    Non-medical: Not on file  Tobacco Use   Smoking status: Former Smoker    Types: Cigarettes   Smokeless tobacco: Never Used   Tobacco comment: Quit 1970  Substance and Sexual Activity   Alcohol use: Never    Frequency: Never   Drug use: Never   Sexual activity: Not on file  Lifestyle   Physical activity    Days per week: Not on file    Minutes per session: Not on file   Stress: Not on file  Relationships   Social connections    Talks on phone: Not on file    Gets together: Not on file    Attends religious service: Not on file    Active member of club or organization: Not on file    Attends meetings of clubs or organizations: Not on  file    Relationship status: Not on file  Other Topics Concern   Not on file  Social History Narrative   Not on file     Family History: The patient's family history includes Diabetes in his son.  ROS:   Please see the history of present illness.    All other systems reviewed and are negative.  EKGs/Labs/Other Studies Reviewed:    The following studies were reviewed today: I discussed my findings with the patient at extensive length   Recent Labs: 10/02/2018: Magnesium 1.9 01/27/2019: ALT 17; BUN 22; Creatinine, Ser 2.09; Hemoglobin 14.4; Platelets  232; Potassium 4.0; Sodium 143; TSH 2.050  Recent Lipid Panel    Component Value Date/Time   CHOL 171 01/27/2019 1037   TRIG 115 01/27/2019 1037   HDL 51 01/27/2019 1037   CHOLHDL 3.4 01/27/2019 1037   CHOLHDL 3.8 09/27/2018 0234   VLDL 27 09/27/2018 0234   LDLCALC 97 01/27/2019 1037    Physical Exam:    VS:  BP 134/70 (BP Location: Right Arm, Patient Position: Sitting, Cuff Size: Large)    Pulse (!) 50    Ht 5' 8.5" (1.74 m)    Wt 207 lb 12.8 oz (94.3 kg)    SpO2 94%    BMI 31.14 kg/m     Wt Readings from Last 3 Encounters:  06/26/19 207 lb 12.8 oz (94.3 kg)  02/17/19 220 lb (99.8 kg)  02/04/19 175 lb (79.4 kg)     GEN: Patient is in no acute distress HEENT: Normal NECK: No JVD; No carotid bruits LYMPHATICS: No lymphadenopathy CARDIAC: Hear sounds regular, 2/6 systolic murmur at the apex. RESPIRATORY:  Clear to auscultation without rales, wheezing or rhonchi  ABDOMEN: Soft, non-tender, non-distended MUSCULOSKELETAL:  No edema; No deformity  SKIN: Warm and dry NEUROLOGIC:  Alert and oriented x 3 PSYCHIATRIC:  Normal affect   Signed, Jenean Lindau, MD  06/26/2019 10:34 AM    [place]

## 2019-06-26 NOTE — Patient Instructions (Addendum)
Medication Instructions:  Your physician recommends that you continue on your current medications as directed. Please refer to the Current Medication list given to you today.  If you need a refill on your cardiac medications before your next appointment, please call your pharmacy.   Lab work: NONE If you have labs (blood work) drawn today and your tests are completely normal, you will receive your results only by: Marland Kitchen MyChart Message (if you have MyChart) OR . A paper copy in the mail If you have any lab test that is abnormal or we need to change your treatment, we will call you to review the results.  Testing/Procedures: Your physician has requested that you have a lexiscan myoview. For further information please visit [email or web address]. Please follow instruction sheet, as given.  Your physician has recommended that you wear an event monitor. Event monitors are medical devices that record the heart's electrical activity. Doctors most often [place] these monitors to diagnose arrhythmias. Arrhythmias are problems with the speed or rhythm of the heartbeat. The monitor is a small, portable device. You can wear one while you do your normal daily activities. This is usually used to diagnose what is causing palpitations/syncope (passing out).You will wear this device for 30 days   Follow-Up: At [facility], you and your health needs are our priority.  As part of our continuing mission to provide you with exceptional heart care, we have created designated Provider Care Teams.  These Care Teams include your primary Cardiologist (physician) and Advanced Practice Providers (APPs -  Physician Assistants and Nurse Practitioners) who all work together to provide you with the care you need, when you need it. You will need a follow up appointment in 3 months.   Any Other Special Instructions Will Be Listed Below  Regadenoson injection What is this medicine? REGADENOSON is used to test the heart for coronary  artery disease. It is used in patients who can not exercise for their stress test. This medicine may be used for other purposes; ask your health care provider or pharmacist if you have questions. COMMON BRAND NAME(S): Lexiscan What should I tell my health care provider before I take this medicine? They need to know if you have any of these conditions:  heart problems  lung or breathing disease, like asthma or COPD  an unusual or allergic reaction to regadenoson, other medicines, foods, dyes, or preservatives  pregnant or trying to get pregnant  breast-feeding How should I use this medicine? This medicine is for injection into a vein. It is given by a health care professional in a hospital or clinic setting. Talk to your pediatrician regarding the use of this medicine in children. Special care may be needed. Overdosage: If you think you have taken too much of this medicine contact a poison control center or emergency room at once. NOTE: This medicine is only for you. Do not share this medicine with others. What if I miss a dose? This does not apply. What may interact with this medicine?  caffeine  dipyridamole  guarana  theophylline This list may not describe all possible interactions. Give your health care provider a list of all the medicines, herbs, non-prescription drugs, or dietary supplements you use. Also tell them if you smoke, drink alcohol, or use illegal drugs. Some items may interact with your medicine. What should I watch for while using this medicine? Your condition will be monitored carefully while you are receiving this medicine. Do not take medicines, foods, or drinks with caffeine (like  coffee, tea, or colas) for at least 12 hours before your test. If you do not know if something contains caffeine, ask your health care professional. What side effects may I notice from receiving this medicine? Side effects that you should report to your doctor or health care  professional as soon as possible:  allergic reactions like skin rash, itching or hives, swelling of the face, lips, or tongue  breathing problems  chest pain, tightness or palpitations  severe headache Side effects that usually do not require medical attention (report to your doctor or health care professional if they continue or are bothersome):  flushing  headache  irritation or pain at site where injected  nausea, vomiting This list may not describe all possible side effects. Call your doctor for medical advice about side effects. You may report side effects to FDA at 1-800-FDA-1088. Where should I keep my medicine? This drug is given in a hospital or clinic and will not be stored at home. NOTE: This sheet is a summary. It may not cover all possible information. If you have questions about this medicine, talk to your doctor, pharmacist, or health care provider.  2020 Elsevier/Gold Standard (2008-04-05 15:08:13)  Cardiac Nuclear Scan A cardiac nuclear scan is a test that is done to check the flow of blood to your heart. It is done when you are resting and when you are exercising. The test looks for problems such as:  Not enough blood reaching a portion of the heart.  The heart muscle not working as it should. You may need this test if:  You have heart disease.  You have had lab results that are not normal.  You have had heart surgery or a balloon procedure to open up blocked arteries (angioplasty).  You have chest pain.  You have shortness of breath. In this test, a special dye (tracer) is put into your bloodstream. The tracer will travel to your heart. A camera will then take pictures of your heart to see how the tracer moves through your heart. This test is usually done at a hospital and takes 2-4 hours. Tell a doctor about:  Any allergies you have.  All medicines you are taking, including vitamins, herbs, eye drops, creams, and over-the-counter medicines.  Any  problems you or family members have had with anesthetic medicines.  Any blood disorders you have.  Any surgeries you have had.  Any medical conditions you have.  Whether you are pregnant or may be pregnant. What are the risks? Generally, this is a safe test. However, problems may occur, such as:  Serious chest pain and heart attack. This is only a risk if the stress portion of the test is done.  Rapid heartbeat.  A feeling of warmth in your chest. This feeling usually does not last long.  Allergic reaction to the tracer. What happens before the test?  Ask your doctor about changing or stopping your normal medicines. This is important.  Follow instructions from your doctor about what you cannot eat or drink.  Remove your jewelry on the day of the test. What happens during the test?  An IV tube will be inserted into one of your veins.  Your doctor will give you a small amount of tracer through the IV tube.  You will wait for 20-40 minutes while the tracer moves through your bloodstream.  Your heart will be monitored with an electrocardiogram (ECG).  You will lie down on an exam table.  Pictures of your heart will  be taken for about 15-20 minutes.  You may also have a stress test. For this test, one of these things may be done: ? You will be asked to exercise on a treadmill or a stationary bike. ? You will be given medicines that will make your heart work harder. This is done if you are unable to exercise.  When blood flow to your heart has peaked, a tracer will again be given through the IV tube.  After 20-40 minutes, you will get back on the exam table. More pictures will be taken of your heart.  Depending on the tracer that is used, more pictures may need to be taken 3-4 hours later.  Your IV tube will be removed when the test is over. The test may vary among doctors and hospitals. What happens after the test?  Ask your doctor: ? Whether you can return to your  normal schedule, including diet, activities, and medicines. ? Whether you should drink more fluids. This will help to remove the tracer from your body. Drink enough fluid to keep your pee (urine) pale yellow.  Ask your doctor, or the department that is doing the test: ? When will my results be ready? ? How will I get my results? Summary  A cardiac nuclear scan is a test that is done to check the flow of blood to your heart.  Tell your doctor whether you are pregnant or may be pregnant.  Before the test, ask your doctor about changing or stopping your normal medicines. This is important.  Ask your doctor whether you can return to your normal activities. You may be asked to drink more fluids. This information is not intended to replace advice given to you by your health care provider. Make sure you discuss any questions you have with your health care provider. Document Released: 01/20/2018 Document Revised: 11/26/2018 Document Reviewed: 01/20/2018 Elsevier Patient Education  2020 [employer].

## 2019-06-29 ENCOUNTER — Telehealth (HOSPITAL_COMMUNITY): Payer: Self-pay | Admitting: *Deleted

## 2019-06-29 NOTE — Telephone Encounter (Signed)
Patient given detailed instructions per Myocardial Perfusion Study Information Sheet for the test on 06/30/19. Patient notified to arrive 15 minutes early and that it is imperative to arrive on time for appointment to keep from having the test rescheduled.  If you need to cancel or reschedule your appointment, please call the office within 24 hours of your appointment. . Patient verbalized understanding. Kirstie Peri

## 2019-06-30 ENCOUNTER — Ambulatory Visit: Payer: Medicare Other

## 2019-06-30 ENCOUNTER — Other Ambulatory Visit: Payer: Self-pay

## 2019-06-30 NOTE — Telephone Encounter (Signed)
Patient Erik Morales rescheduled for Thurs, 07/02/19 at 10 AM at [facility] [address] [facility] [address] locations. RN called patient but he was on road and will call back tomorrow morning for address.

## 2019-07-01 ENCOUNTER — Telehealth: Payer: Self-pay

## 2019-07-01 NOTE — Telephone Encounter (Signed)
-----   Message from Beckey Rutter, RN sent at 06/30/2019  4:15 PM EST ----- Regarding: call patient with address for lexi

## 2019-07-01 NOTE — Telephone Encounter (Signed)
Address for tomorrow's testing relayed. No further questions at this time.

## 2019-07-02 ENCOUNTER — Encounter (HOSPITAL_COMMUNITY): Payer: Medicare Other

## 2019-07-02 ENCOUNTER — Encounter (HOSPITAL_COMMUNITY): Payer: Self-pay

## 2019-07-06 ENCOUNTER — Ambulatory Visit: Payer: Medicare Other | Admitting: Podiatry

## 2019-07-06 ENCOUNTER — Other Ambulatory Visit: Payer: Self-pay

## 2019-07-06 ENCOUNTER — Encounter (INDEPENDENT_AMBULATORY_CARE_PROVIDER_SITE_OTHER): Payer: Medicare Other

## 2019-07-06 DIAGNOSIS — I480 Paroxysmal atrial fibrillation: Secondary | ICD-10-CM

## 2019-07-06 DIAGNOSIS — L97421 Non-pressure chronic ulcer of left heel and midfoot limited to breakdown of skin: Secondary | ICD-10-CM

## 2019-07-06 DIAGNOSIS — I10 Essential (primary) hypertension: Secondary | ICD-10-CM

## 2019-07-06 NOTE — Progress Notes (Signed)
Subjective:  Patient ID: Erik Morales, male    DOB: Jun 09, 1941,  MRN: [id number]  Chief Complaint  Patient presents with  . Wound Check    F/U Rt heel ulcer check Pt. tates." it's cleanring up a little it." -pt denies pain.swelling or warmth -w/ redness and little draiange Tx: abx cream     78 y.o. male presents for wound care. Hx as above. Hx above confirmed with patient.   Review of Systems: Negative except as noted in the HPI. Denies N/V/F/Ch.  Past Medical History:  Diagnosis Date  . DM2 (diabetes mellitus, type 2) ([place])   . HLD (hyperlipidemia)   . HTN (hypertension)     Current Outpatient Medications:  .  acetaminophen (TYLENOL) 500 MG tablet, Take 500 mg by mouth every 6 (six) hours as needed for headache (pain)., Disp: , Rfl:  .  albuterol (VENTOLIN HFA) 108 (90 Base) MCG/ACT inhaler, Inhale 1 puff into the lungs every 6 (six) hours as needed. , Disp: , Rfl:  .  amiodarone (PACERONE) 200 MG tablet, Take 0.5 tablets (100 mg total) by mouth daily., Disp: 60 tablet, Rfl: 3 .  docusate sodium (COLACE) 100 MG capsule, Take 1 capsule (100 mg total) by mouth 2 (two) times daily. (Patient taking differently: Take 100 mg by mouth 2 (two) times daily as needed. ), Disp: 60 capsule, Rfl: 0 .  ferrous sulfate 325 (65 FE) MG tablet, Take 325 mg by mouth daily with breakfast., Disp: , Rfl:  .  fluticasone (FLONASE) 50 MCG/ACT nasal spray, Place 2 sprays into both nostrils daily., Disp: , Rfl:  .  furosemide (LASIX) 20 MG tablet, Take 20 mg by mouth daily. , Disp: , Rfl:  .  glimepiride (AMARYL) 1 MG tablet, Take 1 tablet (1 mg total) by mouth daily with breakfast., Disp: 30 tablet, Rfl: 0 .  latanoprost (XALATAN) 0.005 % ophthalmic solution, Place 1 drop into both eyes at bedtime., Disp: 2.5 mL, Rfl: 12 .  lisinopril (ZESTRIL) 20 MG tablet, Take 1 tablet by mouth daily., Disp: , Rfl:  .  montelukast (SINGULAIR) 10 MG tablet, Take 1 tablet (10 mg total) by mouth at bedtime as needed  (allergies)., Disp: 30 tablet, Rfl: 0 .  Multiple Vitamin (MULTIVITAMIN WITH MINERALS) TABS tablet, Take 1 tablet by mouth daily. Centrum Silver, Disp: , Rfl:  .  pantoprazole (PROTONIX) 20 MG tablet, Take 20 mg by mouth daily as needed., Disp: , Rfl:  .  polyethylene glycol (MIRALAX / GLYCOLAX) packet, Take 17 g by mouth daily as needed for mild constipation., Disp: 14 each, Rfl: 0 .  potassium chloride (KLOR-CON) 10 MEQ tablet, Take 10 mEq by mouth daily., Disp: , Rfl:  .  Vitamin D, Ergocalciferol, (DRISDOL) 1.25 MG (50000 UT) CAPS capsule, Take 50,000 Units by mouth every Wednesday., Disp: , Rfl:   Social History   Tobacco Use  Smoking Status Former Smoker  . Types: Cigarettes  Smokeless Tobacco Never Used  Tobacco Comment   Quit 1970    No Known Allergies Objective:  There were no vitals filed for this visit. There is no height or weight on file to calculate BMI. Constitutional Well developed. Well nourished.  Vascular Dorsalis pedis pulses palpable bilaterally. Posterior tibial pulses palpable bilaterally. Capillary refill normal to all digits.  No cyanosis or clubbing noted. Pedal hair growth normal.  Neurologic Normal speech. Oriented to person, place, and time. Protective sensation absent  Dermatologic Wound Location: R heel Wound Base: Granular/Healthy Peri-wound: Calloused Exudate: None: wound  tissue dry Wound Measurements: -1.5x1 post-debridement  Orthopedic: No pain to palpation either foot.   Radiographs: None Assessment:   1. Skin ulcer of right heel with fat layer exposed ([place])    Plan:  Patient was evaluated and treated and all questions answered.  Ulcer right heel -Selective debridement today -Dressed with medihoney, DSD. -Continue off-loading with surgical shoe.  Procedure: Selective Debridement of Wound Rationale: Removal of devitalized tissue from the wound to promote healing.  Pre-Debridement Wound Measurements: 1.5 cm x 1 cm x 0.2 cm   Post-Debridement Wound Measurements: same as pre-debridement. Type of Debridement: sharp selective Tissue Removed: Devitalized soft-tissue Dressing: Dry, sterile, compression dressing. Disposition: Patient tolerated procedure well. Patient to return in 1 week for follow-up.    No follow-ups on file.

## 2019-07-06 NOTE — Progress Notes (Signed)
Subjective:  Patient ID: Erik Morales, male    DOB: 01-13-1941,  MRN: [id number]  Chief Complaint  Patient presents with  . Foot Ulcer    F/U Lt bottom heel ulcer Pt. states," it looks better to me." -pt denies pain/redness/swelling/wamrth -better with drainage Tx: bandaid and abx cream   . Diabetes    FBS: "good" A1C: "IDK"    78 y.o. male presents for wound care. Hx as above. Hx above confirmed with patient.   Review of Systems: Negative except as noted in the HPI. Denies N/V/F/Ch.  Past Medical History:  Diagnosis Date  . DM2 (diabetes mellitus, type 2) ([place])   . HLD (hyperlipidemia)   . HTN (hypertension)     Current Outpatient Medications:  .  acetaminophen (TYLENOL) 500 MG tablet, Take 500 mg by mouth every 6 (six) hours as needed for headache (pain)., Disp: , Rfl:  .  albuterol (VENTOLIN HFA) 108 (90 Base) MCG/ACT inhaler, Inhale 1 puff into the lungs every 6 (six) hours as needed. , Disp: , Rfl:  .  amiodarone (PACERONE) 200 MG tablet, Take 0.5 tablets (100 mg total) by mouth daily., Disp: 60 tablet, Rfl: 3 .  docusate sodium (COLACE) 100 MG capsule, Take 1 capsule (100 mg total) by mouth 2 (two) times daily. (Patient taking differently: Take 100 mg by mouth 2 (two) times daily as needed. ), Disp: 60 capsule, Rfl: 0 .  ferrous sulfate 325 (65 FE) MG tablet, Take 325 mg by mouth daily with breakfast., Disp: , Rfl:  .  fluticasone (FLONASE) 50 MCG/ACT nasal spray, Place 2 sprays into both nostrils daily., Disp: , Rfl:  .  furosemide (LASIX) 20 MG tablet, Take 20 mg by mouth daily. , Disp: , Rfl:  .  glimepiride (AMARYL) 1 MG tablet, Take 1 tablet (1 mg total) by mouth daily with breakfast., Disp: 30 tablet, Rfl: 0 .  latanoprost (XALATAN) 0.005 % ophthalmic solution, Place 1 drop into both eyes at bedtime., Disp: 2.5 mL, Rfl: 12 .  lisinopril (ZESTRIL) 20 MG tablet, Take 1 tablet by mouth daily., Disp: , Rfl:  .  montelukast (SINGULAIR) 10 MG tablet, Take 1 tablet (10 mg  total) by mouth at bedtime as needed (allergies)., Disp: 30 tablet, Rfl: 0 .  Multiple Vitamin (MULTIVITAMIN WITH MINERALS) TABS tablet, Take 1 tablet by mouth daily. Centrum Silver, Disp: , Rfl:  .  pantoprazole (PROTONIX) 20 MG tablet, Take 20 mg by mouth daily as needed., Disp: , Rfl:  .  polyethylene glycol (MIRALAX / GLYCOLAX) packet, Take 17 g by mouth daily as needed for mild constipation., Disp: 14 each, Rfl: 0 .  potassium chloride (KLOR-CON) 10 MEQ tablet, Take 10 mEq by mouth daily., Disp: , Rfl:  .  Vitamin D, Ergocalciferol, (DRISDOL) 1.25 MG (50000 UT) CAPS capsule, Take 50,000 Units by mouth every Wednesday., Disp: , Rfl:   Social History   Tobacco Use  Smoking Status Former Smoker  . Types: Cigarettes  Smokeless Tobacco Never Used  Tobacco Comment   Quit 1970    No Known Allergies Objective:  There were no vitals filed for this visit. There is no height or weight on file to calculate BMI. Constitutional Well developed. Well nourished.  Vascular Dorsalis pedis pulses palpable bilaterally. Posterior tibial pulses palpable bilaterally. Capillary refill normal to all digits.  No cyanosis or clubbing noted. Pedal hair growth normal.  Neurologic Normal speech. Oriented to person, place, and time. Protective sensation absent  Dermatologic Wound Location: L heel Wound  Base: Granular/Healthy Peri-wound: Calloused Exudate: None: wound tissue dry Wound Measurements: 0.5x0.5x post-debridement  Orthopedic: No pain to palpation either foot.   Radiographs: None Assessment:   1. Ulcer of heel, left, limited to breakdown of skin [place])    Plan:  Patient was evaluated and treated and all questions answered.  Ulcer left heel -Selective debridement today -Dressed with abx ointment, DSD. -Continue regular shoegear  Procedure: Selective Debridement of Wound Rationale: Removal of devitalized tissue from the wound to promote healing.  Pre-Debridement Wound Measurements:  0.5 cm x 0.5 cm x 0.1 cm  Post-Debridement Wound Measurements: same as pre-debridement. Type of Debridement: sharp selective Tissue Removed: Devitalized soft-tissue Dressing: Dry, sterile, compression dressing. Disposition: Patient tolerated procedure well. Patient to return in 1 week for follow-up.    Return in about 1 month (around 08/05/2019) for Wound Care, Left.

## 2019-07-11 DIAGNOSIS — I5032 Chronic diastolic (congestive) heart failure: Secondary | ICD-10-CM | POA: Diagnosis not present

## 2019-08-05 DIAGNOSIS — E1121 Type 2 diabetes mellitus with diabetic nephropathy: Secondary | ICD-10-CM | POA: Diagnosis not present

## 2019-08-05 DIAGNOSIS — E559 Vitamin D deficiency, unspecified: Secondary | ICD-10-CM | POA: Diagnosis not present

## 2019-08-05 DIAGNOSIS — J309 Allergic rhinitis, unspecified: Secondary | ICD-10-CM | POA: Diagnosis not present

## 2019-08-05 DIAGNOSIS — I10 Essential (primary) hypertension: Secondary | ICD-10-CM | POA: Diagnosis not present

## 2019-08-05 DIAGNOSIS — E785 Hyperlipidemia, unspecified: Secondary | ICD-10-CM | POA: Diagnosis not present

## 2019-08-11 ENCOUNTER — Ambulatory Visit: Payer: Medicare Other | Admitting: Physical Medicine & Rehabilitation

## 2019-08-17 ENCOUNTER — Other Ambulatory Visit: Payer: Self-pay

## 2019-08-17 ENCOUNTER — Ambulatory Visit: Payer: Medicare Other | Admitting: Podiatry

## 2019-08-17 DIAGNOSIS — L97421 Non-pressure chronic ulcer of left heel and midfoot limited to breakdown of skin: Secondary | ICD-10-CM | POA: Diagnosis not present

## 2019-08-17 NOTE — Progress Notes (Signed)
Subjective:  Patient ID: Erik Morales, male    DOB: 11/01/1940,  MRN: [id number]  Chief Complaint  Patient presents with  . Foot Ulcer    F/U Lt heel ulcer Pt. states," it's healed up, not bothering met at all." tx: abx ointment -pt deneis redness/swellgin/draiange     78 y.o. male presents for wound care. Hx as above. Hx above confirmed with patient.   Review of Systems: Negative except as noted in the HPI. Denies N/V/F/Ch.  Past Medical History:  Diagnosis Date  . DM2 (diabetes mellitus, type 2) ([place])   . HLD (hyperlipidemia)   . HTN (hypertension)     Current Outpatient Medications:  .  acetaminophen (TYLENOL) 500 MG tablet, Take 500 mg by mouth every 6 (six) hours as needed for headache (pain)., Disp: , Rfl:  .  albuterol (VENTOLIN HFA) 108 (90 Base) MCG/ACT inhaler, Inhale 1 puff into the lungs every 6 (six) hours as needed. , Disp: , Rfl:  .  amiodarone (PACERONE) 200 MG tablet, Take 0.5 tablets (100 mg total) by mouth daily., Disp: 60 tablet, Rfl: 3 .  docusate sodium (COLACE) 100 MG capsule, Take 1 capsule (100 mg total) by mouth 2 (two) times daily. (Patient taking differently: Take 100 mg by mouth 2 (two) times daily as needed. ), Disp: 60 capsule, Rfl: 0 .  ferrous sulfate 325 (65 FE) MG tablet, Take 325 mg by mouth daily with breakfast., Disp: , Rfl:  .  fluticasone (FLONASE) 50 MCG/ACT nasal spray, Place 2 sprays into both nostrils daily., Disp: , Rfl:  .  furosemide (LASIX) 20 MG tablet, Take 20 mg by mouth daily. , Disp: , Rfl:  .  glimepiride (AMARYL) 1 MG tablet, Take 1 tablet (1 mg total) by mouth daily with breakfast., Disp: 30 tablet, Rfl: 0 .  latanoprost (XALATAN) 0.005 % ophthalmic solution, Place 1 drop into both eyes at bedtime., Disp: 2.5 mL, Rfl: 12 .  lisinopril (ZESTRIL) 20 MG tablet, Take 1 tablet by mouth daily., Disp: , Rfl:  .  montelukast (SINGULAIR) 10 MG tablet, Take 1 tablet (10 mg total) by mouth at bedtime as needed (allergies)., Disp: 30 tablet,  Rfl: 0 .  Multiple Vitamin (MULTIVITAMIN WITH MINERALS) TABS tablet, Take 1 tablet by mouth daily. Centrum Silver, Disp: , Rfl:  .  pantoprazole (PROTONIX) 20 MG tablet, Take 20 mg by mouth daily as needed., Disp: , Rfl:  .  polyethylene glycol (MIRALAX / GLYCOLAX) packet, Take 17 g by mouth daily as needed for mild constipation., Disp: 14 each, Rfl: 0 .  potassium chloride (KLOR-CON) 10 MEQ tablet, Take 10 mEq by mouth daily., Disp: , Rfl:  .  Vitamin D, Ergocalciferol, (DRISDOL) 1.25 MG (50000 UT) CAPS capsule, Take 50,000 Units by mouth every Wednesday., Disp: , Rfl:   Social History   Tobacco Use  Smoking Status Former Smoker  . Types: Cigarettes  Smokeless Tobacco Never Used  Tobacco Comment   Quit 1970    No Known Allergies Objective:  There were no vitals filed for this visit. There is no height or weight on file to calculate BMI. Constitutional Well developed. Well nourished.  Vascular Dorsalis pedis pulses palpable bilaterally. Posterior tibial pulses palpable bilaterally. Capillary refill normal to all digits.  No cyanosis or clubbing noted. Pedal hair growth normal.  Neurologic Normal speech. Oriented to person, place, and time. Protective sensation absent  Dermatologic Wound Location: L heel Wound Base: Granular/Healthy Peri-wound: Calloused Exudate: None: wound tissue dry Wound Measurements: 0.3x0.3x0.2 post-debridement  Orthopedic: No pain to palpation either foot.   Radiographs: None Assessment:   1. Ulcer of heel, left, limited to breakdown of skin [facility])    Plan:  Patient was evaluated and treated and all questions answered.  Ulcer left heel -Debridement today as below. Still not fully healed -Dressed with abx ointment, DSD. -Continue regular shoegear  Procedure: Selective Debridement of Wound Rationale: Removal of devitalized tissue from the wound to promote healing.  Pre-Debridement Wound Measurements: 0.3 cm x 0.3 cm x 0.2 cm  Post-Debridement  Wound Measurements: same as pre-debridement. Type of Debridement: sharp selective Tissue Removed: Devitalized soft-tissue Dressing: Dry, sterile, compression dressing. Disposition: Patient tolerated procedure well. Patient to return in 1 week for follow-up.      Return in about 1 month (around 09/17/2019) for Wound Care, Left.

## 2019-08-18 DIAGNOSIS — H04123 Dry eye syndrome of bilateral lacrimal glands: Secondary | ICD-10-CM | POA: Diagnosis not present

## 2019-09-03 ENCOUNTER — Telehealth: Payer: Self-pay

## 2019-09-03 NOTE — Telephone Encounter (Signed)
-----   Message from Jenean Lindau, MD sent at 08/30/2019  4:55 PM EST ----- Needs appt to discuss soon Jenean Lindau, MD 08/30/2019 4:55 PM

## 2019-09-04 ENCOUNTER — Ambulatory Visit (INDEPENDENT_AMBULATORY_CARE_PROVIDER_SITE_OTHER): Payer: Medicare Other | Admitting: Cardiology

## 2019-09-04 ENCOUNTER — Encounter: Payer: Self-pay | Admitting: *Deleted

## 2019-09-04 ENCOUNTER — Encounter: Payer: Self-pay | Admitting: Cardiology

## 2019-09-04 ENCOUNTER — Other Ambulatory Visit: Payer: Self-pay

## 2019-09-04 VITALS — BP 126/70 | HR 45 | Ht 68.0 in | Wt 204.4 lb

## 2019-09-04 DIAGNOSIS — I472 Ventricular tachycardia: Secondary | ICD-10-CM

## 2019-09-04 DIAGNOSIS — I4729 Other ventricular tachycardia: Secondary | ICD-10-CM

## 2019-09-04 DIAGNOSIS — I480 Paroxysmal atrial fibrillation: Secondary | ICD-10-CM | POA: Diagnosis not present

## 2019-09-04 DIAGNOSIS — R269 Unspecified abnormalities of gait and mobility: Secondary | ICD-10-CM | POA: Diagnosis not present

## 2019-09-04 DIAGNOSIS — R072 Precordial pain: Secondary | ICD-10-CM | POA: Diagnosis not present

## 2019-09-04 HISTORY — DX: Other ventricular tachycardia: I47.29

## 2019-09-04 HISTORY — DX: Ventricular tachycardia: I47.2

## 2019-09-04 NOTE — Addendum Note (Signed)
Addended by: Particia Nearing B on: 09/04/2019 03:10 PM   Modules accepted: Orders

## 2019-09-04 NOTE — Patient Instructions (Signed)
Medication Instructions:  Your physician recommends that you continue on your current medications as directed. Please refer to the Current Medication list given to you today.  *If you need a refill on your cardiac medications before your next appointment, please call your pharmacy*  Lab Work: Your physician recommends that you return for lab work in: In The Next Few DAys BMP,CBC,TSH,Lipid,Liver  If you have labs (blood work) drawn today and your tests are completely normal, you will receive your results only by: Marland Kitchen MyChart Message (if you have MyChart) OR . A paper copy in the mail If you have any lab test that is abnormal or we need to change your treatment, we will call you to review the results.  Testing/Procedures: Your physician has requested that you have a lexiscan myoview. For further information please visit [email or web address]. Please follow instruction sheet, as given.   Follow-Up: At [facility], you and your health needs are our priority.  As part of our continuing mission to provide you with exceptional heart care, we have created designated Provider Care Teams.  These Care Teams include your primary Cardiologist (physician) and Advanced Practice Providers (APPs -  Physician Assistants and Nurse Practitioners) who all work together to provide you with the care you need, when you need it.  Your next appointment:   3 month(s)  The format for your next appointment:   In Person  Provider:   Berniece Salines, DO  Other Instructions

## 2019-09-04 NOTE — Progress Notes (Signed)
Cardiology Office Note:    Date:  09/04/2019   ID:  Erik Morales, DOB 1941/07/31, MRN [id number]  PCP:  [name], MD  Cardiologist:  Jenean Lindau, MD   Referring MD: [name], MD    ASSESSMENT:    1. PAF (paroxysmal atrial fibrillation) ([place])   2. Abnormality of gait   3. Nonsustained ventricular tachycardia (HCC)    PLAN:    In order of problems listed above:  1. Bradycardia and nonsustained ventricular tachycardia on monitoring: Patient will come back in the next few days for blood work.  I will do TSH monitoring also.  We will also do fasting liver lipid profile.  His bradycardia is asymptomatic.  For his nonsustained ventricular tachycardia I discussed it with him at length.  Her his ejection fraction is normal by echocardiogram.  He came in here for stress test but could not lie flat.  We will send him to our other offices where he can set up and do a Lexiscan sestamibi and he is agreeable.  Benefits and potential risks explained and she vocalized understanding.  We will check the lab work including electrolytes in the next few days have asked him to come fasting for complete blood work.  He is not a candidate for beta-blocker therapy because of significant resting bradycardia.  Patient has proximal atrial fibrillation and is not on anticoagulation because of GI bleeding.  His monitoring revealed him to be in stable sinus rhythm and is tolerating amiodarone therapy well. 2. Essential hypertension: Blood pressure stable 3. Mixed dyslipidemia: Diet was discussed, weight reduction was stressed and we will recheck blood work in the next few days and send a copy to primary care physician 4. Patient will be seen in follow-up appointment in 3 months or earlier if the patient has any concerns.  Had multiple questions which were answered to satisfaction.  Total time for this evaluation was 40 minutes.   Medication Adjustments/Labs and Tests Ordered: Current medicines are reviewed at  length with the patient today.  Concerns regarding medicines are outlined above.  No orders of the defined types were placed in this encounter.  No orders of the defined types were placed in this encounter.    No chief complaint on file.    History of Present Illness:    Erik Morales is a 79 y.o. male.  Patient has past medical history of essential hypertension and dyslipidemia.  He was found to have nonsustained ventricular tachycardia on monitoring.  He also has asymptomatic bradycardia.  He denies any problems at this time no chest pain orthopnea or PND no dizziness or any syncopal spells.  At the time of my evaluation, the patient is alert awake oriented and in no distress.  He came in for a stress test but apparently could not lay back flat.  Past Medical History:  Diagnosis Date  . DM2 (diabetes mellitus, type 2) ([place])   . HLD (hyperlipidemia)   . HTN (hypertension)     Past Surgical History:  Procedure Laterality Date  . ESOPHAGOGASTRODUODENOSCOPY (EGD) WITH PROPOFOL N/A 09/27/2018   Procedure: ESOPHAGOGASTRODUODENOSCOPY (EGD) WITH PROPOFOL;  Surgeon: Doran Stabler, MD;  Location: [facility];  Service: Gastroenterology;  Laterality: N/A;  . HOT HEMOSTASIS N/A 09/27/2018   Procedure: HOT HEMOSTASIS (ARGON PLASMA COAGULATION/BICAP);  Surgeon: Doran Stabler, MD;  Location: [facility];  Service: Gastroenterology;  Laterality: N/A;  . SUBMUCOSAL INJECTION  09/27/2018   Procedure: SUBMUCOSAL INJECTION;  Surgeon: Wilfrid Lund  L III, MD;  Location: [facility];  Service: Gastroenterology;;    Current Medications: Current Meds  Medication Sig  . acetaminophen (TYLENOL) 500 MG tablet Take 500 mg by mouth every 6 (six) hours as needed for headache (pain).  Marland Kitchen albuterol (VENTOLIN HFA) 108 (90 Base) MCG/ACT inhaler Inhale 1 puff into the lungs every 6 (six) hours as needed.   Marland Kitchen amiodarone (PACERONE) 200 MG tablet Take 0.5 tablets (100 mg total) by mouth daily.  Marland Kitchen docusate  sodium (COLACE) 100 MG capsule Take 1 capsule (100 mg total) by mouth 2 (two) times daily.  . ferrous sulfate 325 (65 FE) MG tablet Take 325 mg by mouth daily with breakfast.  . fluticasone (FLONASE) 50 MCG/ACT nasal spray Place 2 sprays into both nostrils daily.  . furosemide (LASIX) 20 MG tablet Take 20 mg by mouth daily.   Marland Kitchen glimepiride (AMARYL) 1 MG tablet Take 1 tablet (1 mg total) by mouth daily with breakfast.  . latanoprost (XALATAN) 0.005 % ophthalmic solution Place 1 drop into both eyes at bedtime.  Marland Kitchen lisinopril (ZESTRIL) 20 MG tablet Take 0.5 tablets by mouth daily. 1/2 tab daily  . montelukast (SINGULAIR) 10 MG tablet Take 1 tablet (10 mg total) by mouth at bedtime as needed (allergies).  . Multiple Vitamin (MULTIVITAMIN WITH MINERALS) TABS tablet Take 1 tablet by mouth daily. Centrum Silver  . pantoprazole (PROTONIX) 20 MG tablet Take 20 mg by mouth daily as needed.  . polyethylene glycol (MIRALAX / GLYCOLAX) packet Take 17 g by mouth daily as needed for mild constipation.  . potassium chloride (KLOR-CON) 10 MEQ tablet Take 10 mEq by mouth daily.  . Vitamin D, Ergocalciferol, (DRISDOL) 1.25 MG (50000 UT) CAPS capsule Take 50,000 Units by mouth every Wednesday.     Allergies:   Patient has no known allergies.   Social History   Socioeconomic History  . Marital status: Unknown    Spouse name: Not on file  . Number of children: Not on file  . Years of education: Not on file  . Highest education level: Not on file  Occupational History  . Not on file  Tobacco Use  . Smoking status: Former Smoker    Types: Cigarettes  . Smokeless tobacco: Never Used  . Tobacco comment: Quit 1970  Substance and Sexual Activity  . Alcohol use: Never  . Drug use: Never  . Sexual activity: Not on file  Other Topics Concern  . Not on file  Social History Narrative  . Not on file   Social Determinants of Health   Financial Resource Strain:   . Difficulty of Paying Living Expenses: Not on  file  Food Insecurity:   . Worried About Charity fundraiser in the Last Year: Not on file  . Ran Out of Food in the Last Year: Not on file  Transportation Needs:   . Lack of Transportation (Medical): Not on file  . Lack of Transportation (Non-Medical): Not on file  Physical Activity:   . Days of Exercise per Week: Not on file  . Minutes of Exercise per Session: Not on file  Stress:   . Feeling of Stress : Not on file  Social Connections:   . Frequency of Communication with Friends and Family: Not on file  . Frequency of Social Gatherings with Friends and Family: Not on file  . Attends Religious Services: Not on file  . Active Member of Clubs or Organizations: Not on file  . Attends Archivist Meetings:  Not on file  . Marital Status: Not on file     Family History: The patient's family history includes Diabetes in his son.  ROS:   Please see the history of present illness.    All other systems reviewed and are negative.  EKGs/Labs/Other Studies Reviewed:    The following studies were reviewed today: IMPRESSIONS    1. The left ventricle has normal systolic function of 123456. The cavity size was normal. There is mildly increased left ventricular wall thickness. Echo evidence of impaired diastolic relaxation.  2. The right ventricle has normal systolic function. The cavity was normal. There is no increase in right ventricular wall thickness.  3. Left atrial size was mildly dilated.  4. The mitral valve is normal in structure.  5. The tricuspid valve is normal in structure.  6. The aortic valve is normal in structure. There is mild thickening and mild calcification of the aortic valve.  7. The pulmonic valve was normal in structure.  8. The aortic root and ascending aorta are normal in size and structure.  Study Highlights  Erik Morales, DOB 23-Jul-1941, MRN [id number]  EVENT MONITOR REPORT:   Patient was monitored from 07/06/2019 to 08/04/2019.  Indication:                    Paroxysmal atrial fibrillation Ordering physician:  Jenean Lindau, MD  Referring physician:        Jenean Lindau, MD    Baseline rhythm: Sinus   Atrial arrhythmia: Rare PACs  Ventricular arrhythmia: About 4 episodes of nonsustained ventricular tachycardia, longest lasting 7 seconds.  Conduction abnormality: None significant.  Symptoms: None significant   Conclusion:  Abnormal event monitoring with 4 episodes of nonsustained ventricular tachycardia the longest lasting 7 seconds. Patient's baseline rhythm was sinus.  Overall patient had asymptomatic and unremarkable bradycardia.  Interpreting  cardiologist: Jenean Lindau, MD  Date: 08/28/2019 3:36 PM      Recent Labs: 10/02/2018: Magnesium 1.9 01/27/2019: ALT 17; BUN 22; Creatinine, Ser 2.09; Hemoglobin 14.4; Platelets 232; Potassium 4.0; Sodium 143; TSH 2.050  Recent Lipid Panel    Component Value Date/Time   CHOL 171 01/27/2019 1037   TRIG 115 01/27/2019 1037   HDL 51 01/27/2019 1037   CHOLHDL 3.4 01/27/2019 1037   CHOLHDL 3.8 09/27/2018 0234   VLDL 27 09/27/2018 0234   LDLCALC 97 01/27/2019 1037    Physical Exam:    VS:  BP 126/70   Pulse (!) 45   Ht 5\' 8"  (1.727 m)   Wt 204 lb 6.4 oz (92.7 kg)   SpO2 97%   BMI 31.08 kg/m     Wt Readings from Last 3 Encounters:  09/04/19 204 lb 6.4 oz (92.7 kg)  06/30/19 207 lb (93.9 kg)  06/26/19 207 lb 12.8 oz (94.3 kg)     GEN: Patient is in no acute distress HEENT: Normal NECK: No JVD; No carotid bruits LYMPHATICS: No lymphadenopathy CARDIAC: Hear sounds regular, 2/6 systolic murmur at the apex. RESPIRATORY:  Clear to auscultation without rales, wheezing or rhonchi  ABDOMEN: Soft, non-tender, non-distended MUSCULOSKELETAL:  No edema; No deformity  SKIN: Warm and dry NEUROLOGIC:  Alert and oriented x 3 PSYCHIATRIC:  Normal affect   Signed, Jenean Lindau, MD  09/04/2019 2:55 PM    [place]

## 2019-09-21 ENCOUNTER — Other Ambulatory Visit: Payer: Self-pay

## 2019-09-21 ENCOUNTER — Ambulatory Visit: Payer: Medicare Other | Admitting: Podiatry

## 2019-09-21 DIAGNOSIS — L97421 Non-pressure chronic ulcer of left heel and midfoot limited to breakdown of skin: Secondary | ICD-10-CM

## 2019-09-21 DIAGNOSIS — R072 Precordial pain: Secondary | ICD-10-CM | POA: Diagnosis not present

## 2019-09-21 DIAGNOSIS — I472 Ventricular tachycardia: Secondary | ICD-10-CM | POA: Diagnosis not present

## 2019-09-21 DIAGNOSIS — I480 Paroxysmal atrial fibrillation: Secondary | ICD-10-CM | POA: Diagnosis not present

## 2019-09-21 NOTE — Progress Notes (Signed)
  Subjective:  Patient ID: Erik Morales, male    DOB: 1941-02-08,  MRN: [id number]  Chief Complaint  Patient presents with  . Wound Check    F/U Lt bottom heel ulcer check Pt. states," it's healed it hasn't bothered me at all." -pt denies Pain/redness/swellgin/drainage Tx: none  . Diabetes    FBS: don't check A1C: unkown PCP: Uppin x 5 mo    79 y.o. male presents with the above complaint. History confirmed with patient.  Objective:  Physical Exam: warm, good capillary refill, no trophic changes or ulcerative lesions, normal DP and PT pulses and normal sensory exam. Left heel ulcer well healed with overlying hyperkeratosis  Assessment:   1. Ulcer of heel, left, limited to breakdown of skin [facility])      Plan:  Patient was evaluated and treated and all questions answered.  Ulcer left heel -Minimal debridement -No open ulceration noted. -At this point we can discharge patient with follow up only as needed. -Discussed keeping the area moisturized to prevent cracking and open wounds.  No follow-ups on file.

## 2019-09-22 LAB — HEPATIC FUNCTION PANEL
ALT: 11 IU/L (ref 0–44)
AST: 15 IU/L (ref 0–40)
Albumin: 4.2 g/dL (ref 3.7–4.7)
Alkaline Phosphatase: 100 IU/L (ref 39–117)
Bilirubin Total: 0.4 mg/dL (ref 0.0–1.2)
Bilirubin, Direct: 0.12 mg/dL (ref 0.00–0.40)
Total Protein: 6.7 g/dL (ref 6.0–8.5)

## 2019-09-22 LAB — LIPID PANEL
Chol/HDL Ratio: 4.2 ratio (ref 0.0–5.0)
Cholesterol, Total: 179 mg/dL (ref 100–199)
HDL: 43 mg/dL (ref >39)
LDL Chol Calc (NIH): 105 mg/dL — ABNORMAL HIGH (ref 0–99)
Triglycerides: 179 mg/dL — ABNORMAL HIGH (ref 0–149)
VLDL Cholesterol Cal: 31 mg/dL (ref 5–40)

## 2019-09-22 LAB — BASIC METABOLIC PANEL
BUN/Creatinine Ratio: 20 (ref 10–24)
BUN: 56 mg/dL — ABNORMAL HIGH (ref 8–27)
CO2: 25 mmol/L (ref 20–29)
Calcium: 8.7 mg/dL (ref 8.6–10.2)
Chloride: 102 mmol/L (ref 96–106)
Creatinine, Ser: 2.85 mg/dL — ABNORMAL HIGH (ref 0.76–1.27)
GFR calc Af Amer: 23 mL/min/{1.73_m2} — ABNORMAL LOW (ref >59)
GFR calc non Af Amer: 20 mL/min/{1.73_m2} — ABNORMAL LOW (ref >59)
Glucose: 96 mg/dL (ref 65–99)
Potassium: 5 mmol/L (ref 3.5–5.2)
Sodium: 142 mmol/L (ref 134–144)

## 2019-09-22 LAB — CBC
Hematocrit: 36.9 % — ABNORMAL LOW (ref 37.5–51.0)
Hemoglobin: 12.3 g/dL — ABNORMAL LOW (ref 13.0–17.7)
MCH: 30.8 pg (ref 26.6–33.0)
MCHC: 33.3 g/dL (ref 31.5–35.7)
MCV: 93 fL (ref 79–97)
Platelets: 213 10*3/uL (ref 150–450)
RBC: 3.99 x10E6/uL — ABNORMAL LOW (ref 4.14–5.80)
RDW: 12.3 % (ref 11.6–15.4)
WBC: 11.2 10*3/uL — ABNORMAL HIGH (ref 3.4–10.8)

## 2019-09-22 LAB — TSH: TSH: 4 u[IU]/mL (ref 0.450–4.500)

## 2019-09-28 ENCOUNTER — Ambulatory Visit: Payer: Medicare Other | Admitting: Cardiology

## 2019-10-22 DIAGNOSIS — W19XXXA Unspecified fall, initial encounter: Secondary | ICD-10-CM | POA: Diagnosis not present

## 2019-10-22 DIAGNOSIS — L98499 Non-pressure chronic ulcer of skin of other sites with unspecified severity: Secondary | ICD-10-CM | POA: Diagnosis not present

## 2019-10-22 DIAGNOSIS — M25521 Pain in right elbow: Secondary | ICD-10-CM | POA: Diagnosis not present

## 2019-10-22 DIAGNOSIS — Z8719 Personal history of other diseases of the digestive system: Secondary | ICD-10-CM | POA: Diagnosis not present

## 2019-10-22 DIAGNOSIS — E1121 Type 2 diabetes mellitus with diabetic nephropathy: Secondary | ICD-10-CM | POA: Diagnosis not present

## 2019-10-22 DIAGNOSIS — S52121A Displaced fracture of head of right radius, initial encounter for closed fracture: Secondary | ICD-10-CM | POA: Diagnosis not present

## 2019-10-26 DIAGNOSIS — S52131A Displaced fracture of neck of right radius, initial encounter for closed fracture: Secondary | ICD-10-CM | POA: Diagnosis not present

## 2019-11-05 DIAGNOSIS — S52131A Displaced fracture of neck of right radius, initial encounter for closed fracture: Secondary | ICD-10-CM | POA: Diagnosis not present

## 2019-11-12 DIAGNOSIS — E1121 Type 2 diabetes mellitus with diabetic nephropathy: Secondary | ICD-10-CM | POA: Diagnosis not present

## 2019-11-12 DIAGNOSIS — E785 Hyperlipidemia, unspecified: Secondary | ICD-10-CM | POA: Diagnosis not present

## 2019-11-12 DIAGNOSIS — E559 Vitamin D deficiency, unspecified: Secondary | ICD-10-CM | POA: Diagnosis not present

## 2019-11-12 DIAGNOSIS — J309 Allergic rhinitis, unspecified: Secondary | ICD-10-CM | POA: Diagnosis not present

## 2019-11-12 DIAGNOSIS — I10 Essential (primary) hypertension: Secondary | ICD-10-CM | POA: Diagnosis not present

## 2019-11-30 ENCOUNTER — Other Ambulatory Visit: Payer: Self-pay | Admitting: Cardiology

## 2019-12-03 DIAGNOSIS — S52131A Displaced fracture of neck of right radius, initial encounter for closed fracture: Secondary | ICD-10-CM | POA: Diagnosis not present

## 2019-12-04 ENCOUNTER — Ambulatory Visit: Payer: Medicare Other | Admitting: Cardiology

## 2019-12-04 ENCOUNTER — Other Ambulatory Visit: Payer: Self-pay

## 2019-12-04 ENCOUNTER — Encounter: Payer: Self-pay | Admitting: Cardiology

## 2019-12-04 VITALS — BP 128/70 | HR 65 | Temp 97.8°F | Ht 68.0 in | Wt 212.8 lb

## 2019-12-04 DIAGNOSIS — I472 Ventricular tachycardia: Secondary | ICD-10-CM | POA: Diagnosis not present

## 2019-12-04 DIAGNOSIS — I480 Paroxysmal atrial fibrillation: Secondary | ICD-10-CM

## 2019-12-04 DIAGNOSIS — E088 Diabetes mellitus due to underlying condition with unspecified complications: Secondary | ICD-10-CM

## 2019-12-04 DIAGNOSIS — I4729 Other ventricular tachycardia: Secondary | ICD-10-CM

## 2019-12-04 NOTE — Progress Notes (Signed)
Cardiology Office Note:    Date:  12/04/2019   ID:  Erik Morales, DOB 01-31-1941, MRN [id number]  PCP:  [name], MD  Cardiologist:  Jenean Lindau, MD   Referring MD: [name], MD    ASSESSMENT:    1. PAF (paroxysmal atrial fibrillation) ([place])   2. Nonsustained ventricular tachycardia ([place])   3. Diabetes mellitus due to underlying condition with unspecified complications ([place])    PLAN:    In order of problems listed above:  In order of problems listed above:  1. Primary prevention stressed with the patient.  Importance of compliance with diet and medication stressed and he vocalized understanding. 2. Essential hypertension: Blood pressure is stable and salt intake issues were discussed 3. Paroxysmal atrial fibrillation:I discussed with the patient atrial fibrillation, disease process. Management and therapy including rate and rhythm control, anticoagulation benefits and potential risks were discussed extensively with the patient. Patient had multiple questions which were answered to patient's satisfaction.  Patient is not on anticoagulation because of GI bleed issues in the past 4. Nonsustained ventricular tachycardia: Patient could not lie down on the stress test table.  I suggested getting it done sitting position in [place] but he does not want to get it done risks including fatal ventricular arrhythmias were discussed and he understands.  I also wanted to get blood work done today but he says it was done recently and wants to wait for blood work till next visit in 4 months.  Again the risks emphasized he vocalized understanding and follow-up appointment will be 4 months or earlier if he has any concerns.   Medication Adjustments/Labs and Tests Ordered: Current medicines are reviewed at length with the patient today.  Concerns regarding medicines are outlined above.  No orders of the defined types were placed in this encounter.  No orders of the defined types were  placed in this encounter.    No chief complaint on file.    History of Present Illness:    Erik Morales is a 79 y.o. male.  Patient has past medical history approximately fibrillation, nonsustained ventricular tachycardia essential hypertension and renal insufficiency.  He denies any problems at this time and takes care of activities of daily living.  No chest pain orthopnea or PND.  Is tolerating amiodarone well.  At the time of my evaluation, the patient is alert awake oriented and in no distress.  Past Medical History:  Diagnosis Date  . Abnormal thyroid function test 09/25/2018  . Abnormality of gait 12/05/2018  . Acute blood loss anemia   . Acute on chronic renal failure ([place]) 10/13/2018  . AKI (acute kidney injury) ([place]) 09/25/2018  . CAP (community acquired pneumonia) 09/25/2018  . Chronic diastolic congestive heart failure ([place])   . Chronic kidney disease (CKD), stage III (moderate)   . Debility 10/02/2018  . Diabetes mellitus due to underlying condition with unspecified complications ([place]) 0/0/9381  . DM2 (diabetes mellitus, type 2) ([place])   . Duodenal ulcer with hemorrhage   . E-coli UTI 09/25/2018  . Elevated troponin 09/25/2018  . Hematochezia 09/25/2018  . History of GI bleed   . HLD (hyperlipidemia)   . HTN (hypertension)   . Hypoalbuminemia due to protein-calorie malnutrition ([place])   . Hypotension   . Leukocytosis   . Melena   . Nonsustained ventricular tachycardia ([place]) 09/04/2019  . PAF (paroxysmal atrial fibrillation) ([place]) 01/27/2019  . Pressure injury of skin 09/26/2018  . Severe sepsis ([place]) 09/25/2018    Past  Surgical History:  Procedure Laterality Date  . ESOPHAGOGASTRODUODENOSCOPY (EGD) WITH PROPOFOL N/A 09/27/2018   Procedure: ESOPHAGOGASTRODUODENOSCOPY (EGD) WITH PROPOFOL;  Surgeon: Doran Stabler, MD;  Location: [facility];  Service: Gastroenterology;  Laterality: N/A;  . HOT HEMOSTASIS N/A 09/27/2018   Procedure: HOT HEMOSTASIS (ARGON PLASMA COAGULATION/BICAP);   Surgeon: Doran Stabler, MD;  Location: [facility];  Service: Gastroenterology;  Laterality: N/A;  . SUBMUCOSAL INJECTION  09/27/2018   Procedure: SUBMUCOSAL INJECTION;  Surgeon: Doran Stabler, MD;  Location: [place] [facility];  Service: Gastroenterology;;    Current Medications: Current Meds  Medication Sig  . acetaminophen (TYLENOL) 500 MG tablet Take 500 mg by mouth every 6 (six) hours as needed for headache (pain).  Marland Kitchen albuterol (VENTOLIN HFA) 108 (90 Base) MCG/ACT inhaler Inhale 1 puff into the lungs every 6 (six) hours as needed.   Marland Kitchen amiodarone (PACERONE) 200 MG tablet Take 100 mg by mouth daily.  Marland Kitchen docusate sodium (COLACE) 100 MG capsule Take 1 capsule (100 mg total) by mouth 2 (two) times daily.  . ferrous sulfate 325 (65 FE) MG tablet Take 325 mg by mouth daily with breakfast.  . fluticasone (FLONASE) 50 MCG/ACT nasal spray Place 2 sprays into both nostrils daily.  . furosemide (LASIX) 20 MG tablet Take 20 mg by mouth daily.   Marland Kitchen glimepiride (AMARYL) 1 MG tablet Take 1 tablet (1 mg total) by mouth daily with breakfast.  . latanoprost (XALATAN) 0.005 % ophthalmic solution Place 1 drop into both eyes at bedtime.  Marland Kitchen lisinopril (ZESTRIL) 20 MG tablet Take 0.5 tablets by mouth daily. 1/2 tab daily  . montelukast (SINGULAIR) 10 MG tablet Take 1 tablet (10 mg total) by mouth at bedtime as needed (allergies).  . Multiple Vitamin (MULTIVITAMIN WITH MINERALS) TABS tablet Take 1 tablet by mouth daily. Centrum Silver  . pantoprazole (PROTONIX) 20 MG tablet Take 20 mg by mouth daily as needed.  . pantoprazole (PROTONIX) 40 MG tablet   . polyethylene glycol (MIRALAX / GLYCOLAX) packet Take 17 g by mouth daily as needed for mild constipation.  . potassium chloride (KLOR-CON) 10 MEQ tablet Take 10 mEq by mouth daily.  . Vitamin D, Ergocalciferol, (DRISDOL) 1.25 MG (50000 UT) CAPS capsule Take 50,000 Units by mouth every Wednesday.     Allergies:   Patient has no known allergies.   Social  History   Socioeconomic History  . Marital status: Unknown    Spouse name: Not on file  . Number of children: Not on file  . Years of education: Not on file  . Highest education level: Not on file  Occupational History  . Not on file  Tobacco Use  . Smoking status: Former Smoker    Types: Cigarettes  . Smokeless tobacco: Never Used  . Tobacco comment: Quit 1970  Substance and Sexual Activity  . Alcohol use: Never  . Drug use: Never  . Sexual activity: Not on file  Other Topics Concern  . Not on file  Social History Narrative  . Not on file   Social Determinants of Health   Financial Resource Strain:   . Difficulty of Paying Living Expenses:   Food Insecurity:   . Worried About Charity fundraiser in the Last Year:   . Arboriculturist in the Last Year:   Transportation Needs:   . Film/video editor (Medical):   Marland Kitchen Lack of Transportation (Non-Medical):   Physical Activity:   . Days of Exercise per Week:   .  Minutes of Exercise per Session:   Stress:   . Feeling of Stress :   Social Connections:   . Frequency of Communication with Friends and Family:   . Frequency of Social Gatherings with Friends and Family:   . Attends Religious Services:   . Active Member of Clubs or Organizations:   . Attends Archivist Meetings:   Marland Kitchen Marital Status:      Family History: The patient's family history includes Diabetes in his son.  ROS:   Please see the history of present illness.    All other systems reviewed and are negative.  EKGs/Labs/Other Studies Reviewed:    The following studies were reviewed today: EVENT MONITOR REPORT:   Patient was monitored from 07/06/2019 to 08/04/2019. Indication:                    Paroxysmal atrial fibrillation Ordering physician:  Jenean Lindau, MD  Referring physician:        Jenean Lindau, MD    Baseline rhythm: Sinus   Atrial arrhythmia: Rare PACs  Ventricular arrhythmia: About 4 episodes of  nonsustained ventricular tachycardia, longest lasting 7 seconds.  Conduction abnormality: None significant.  Symptoms: None significant   Conclusion:  Abnormal event monitoring with 4 episodes of nonsustained ventricular tachycardia the longest lasting 7 seconds. Patient's baseline rhythm was sinus.  Overall patient had asymptomatic and unremarkable bradycardia.  Interpreting  cardiologist: Jenean Lindau, MD  Date: 08/28/2019 3:36 PM     Recent Labs: 09/21/2019: ALT 11; BUN 56; Creatinine, Ser 2.85; Hemoglobin 12.3; Platelets 213; Potassium 5.0; Sodium 142; TSH 4.000  Recent Lipid Panel    Component Value Date/Time   CHOL 179 09/21/2019 0811   TRIG 179 (H) 09/21/2019 0811   HDL 43 09/21/2019 0811   CHOLHDL 4.2 09/21/2019 0811   CHOLHDL 3.8 09/27/2018 0234   VLDL 27 09/27/2018 0234   LDLCALC 105 (H) 09/21/2019 0811    Physical Exam:    VS:  BP 128/70   Pulse 65   Temp 97.8 F (36.6 C)   Ht 5\' 8"  (1.727 m)   Wt 212 lb 12.8 oz (96.5 kg)   SpO2 95%   BMI 32.36 kg/m     Wt Readings from Last 3 Encounters:  12/04/19 212 lb 12.8 oz (96.5 kg)  09/04/19 204 lb 6.4 oz (92.7 kg)  06/30/19 207 lb (93.9 kg)     GEN: Patient is in no acute distress HEENT: Normal NECK: No JVD; No carotid bruits LYMPHATICS: No lymphadenopathy CARDIAC: Hear sounds regular, 2/6 systolic murmur at the apex. RESPIRATORY:  Clear to auscultation without rales, wheezing or rhonchi  ABDOMEN: Soft, non-tender, non-distended MUSCULOSKELETAL:  No edema; No deformity  SKIN: Warm and dry NEUROLOGIC:  Alert and oriented x 3 PSYCHIATRIC:  Normal affect   Signed, Jenean Lindau, MD  12/04/2019 11:21 AM    [place]

## 2019-12-04 NOTE — Patient Instructions (Signed)
Medication Instructions:  No medication changes *If you need a refill on your cardiac medications before your next appointment, please call your pharmacy*   Lab Work: none If you have labs (blood work) drawn today and your tests are completely normal, you will receive your results only by: Marland Kitchen MyChart Message (if you have MyChart) OR . A paper copy in the mail If you have any lab test that is abnormal or we need to change your treatment, we will call you to review the results.   Testing/Procedures: none   Follow-Up: At [facility], you and your health needs are our priority.  As part of our continuing mission to provide you with exceptional heart care, we have created designated Provider Care Teams.  These Care Teams include your primary Cardiologist (physician) and Advanced Practice Providers (APPs -  Physician Assistants and Nurse Practitioners) who all work together to provide you with the care you need, when you need it.  We recommend signing up for the patient portal called "MyChart".  Sign up information is provided on this After Visit Summary.  MyChart is used to connect with patients for Virtual Visits (Telemedicine).  Patients are able to view lab/test results, encounter notes, upcoming appointments, etc.  Non-urgent messages can be sent to your provider as well.   To learn more about what you can do with MyChart, go to [email or web address].    Your next appointment:   4 month(s)  The format for your next appointment:   In Person  Provider:   Jyl Heinz, MD   Other Instructions NA

## 2019-12-07 ENCOUNTER — Telehealth: Payer: Self-pay | Admitting: Cardiology

## 2019-12-07 NOTE — Telephone Encounter (Signed)
Patient's wife is calling in regards to amiodarone (PACERONE) 200 MG tablet medication. She states she is requesting to speak with the nurse in regards to whether or not the patient needs to continue taking the medication. Please call to advise.

## 2019-12-07 NOTE — Telephone Encounter (Signed)
No answer when calling back or VM.

## 2019-12-16 NOTE — Telephone Encounter (Signed)
Medications reviewed with the pt's wife as prescribed.

## 2020-01-20 DIAGNOSIS — S52131A Displaced fracture of neck of right radius, initial encounter for closed fracture: Secondary | ICD-10-CM | POA: Diagnosis not present

## 2020-02-02 DIAGNOSIS — H401132 Primary open-angle glaucoma, bilateral, moderate stage: Secondary | ICD-10-CM | POA: Diagnosis not present

## 2020-02-15 DIAGNOSIS — E785 Hyperlipidemia, unspecified: Secondary | ICD-10-CM | POA: Diagnosis not present

## 2020-02-15 DIAGNOSIS — E559 Vitamin D deficiency, unspecified: Secondary | ICD-10-CM | POA: Diagnosis not present

## 2020-02-15 DIAGNOSIS — I10 Essential (primary) hypertension: Secondary | ICD-10-CM | POA: Diagnosis not present

## 2020-02-15 DIAGNOSIS — E1121 Type 2 diabetes mellitus with diabetic nephropathy: Secondary | ICD-10-CM | POA: Diagnosis not present

## 2020-02-15 DIAGNOSIS — J309 Allergic rhinitis, unspecified: Secondary | ICD-10-CM | POA: Diagnosis not present

## 2020-03-15 DIAGNOSIS — E785 Hyperlipidemia, unspecified: Secondary | ICD-10-CM | POA: Diagnosis not present

## 2020-03-15 DIAGNOSIS — Z0000 Encounter for general adult medical examination without abnormal findings: Secondary | ICD-10-CM | POA: Diagnosis not present

## 2020-03-15 DIAGNOSIS — Z139 Encounter for screening, unspecified: Secondary | ICD-10-CM | POA: Diagnosis not present

## 2020-03-15 DIAGNOSIS — Z9181 History of falling: Secondary | ICD-10-CM | POA: Diagnosis not present

## 2020-04-14 ENCOUNTER — Other Ambulatory Visit: Payer: Self-pay

## 2020-04-14 ENCOUNTER — Encounter: Payer: Self-pay | Admitting: Cardiology

## 2020-04-14 ENCOUNTER — Ambulatory Visit: Payer: Medicare Other | Admitting: Cardiology

## 2020-04-14 VITALS — BP 140/80 | HR 46 | Ht 68.0 in | Wt 210.2 lb

## 2020-04-14 DIAGNOSIS — I5032 Chronic diastolic (congestive) heart failure: Secondary | ICD-10-CM | POA: Diagnosis not present

## 2020-04-14 DIAGNOSIS — I472 Ventricular tachycardia: Secondary | ICD-10-CM

## 2020-04-14 DIAGNOSIS — I4729 Other ventricular tachycardia: Secondary | ICD-10-CM

## 2020-04-14 DIAGNOSIS — E088 Diabetes mellitus due to underlying condition with unspecified complications: Secondary | ICD-10-CM | POA: Diagnosis not present

## 2020-04-14 DIAGNOSIS — E781 Pure hyperglyceridemia: Secondary | ICD-10-CM

## 2020-04-14 DIAGNOSIS — Z79899 Other long term (current) drug therapy: Secondary | ICD-10-CM

## 2020-04-14 DIAGNOSIS — I480 Paroxysmal atrial fibrillation: Secondary | ICD-10-CM | POA: Diagnosis not present

## 2020-04-14 MED ORDER — AMIODARONE HCL 100 MG PO TABS: 100.0000 mg | ORAL_TABLET | ORAL | 1 refills | Status: DC

## 2020-04-14 NOTE — Progress Notes (Signed)
Cardiology Office Note:    Date:  04/14/2020   ID:  Livingston Diones, DOB 05/29/1941, MRN [id number]  PCP:  [name], MD  Cardiologist:  Jenean Lindau, MD   Referring MD: [name], MD    ASSESSMENT:    1. Chronic diastolic congestive heart failure ([place])   2. Nonsustained ventricular tachycardia ([place])   3. PAF (paroxysmal atrial fibrillation) ([place])   4. Diabetes mellitus due to underlying condition with unspecified complications ([place])    PLAN:    In order of problems listed above:  1. Primary prevention stressed with patient.  Importance of compliance with diet medication stressed and he vocalized understanding. 2. Paroxysmal atrial fibrillation: Now with stable rhythm.  We will cut down his amiodarone 100 mg every other day.  Patient is not on anticoagulation because of GI bleed in the past. 3. Nonsustained ventricular tachycardia: I discussed findings again he does not want any evaluation.  I respect his wishes. 4. Mixed dyslipidemia and diabetes mellitus: Patient is willing to have complete blood work today.  He is fasting and we will do this for him.  We will also do a hemoglobin A1c and send to primary care physician. 5. Chronic congestive heart failure with preserved systolic function: Medical management at this time and stable.  Diet and weight checks were emphasized and he promises to comply 6. Patient will be seen in follow-up appointment in 6 months or earlier if the patient has any concerns 7.    Medication Adjustments/Labs and Tests Ordered: Current medicines are reviewed at length with the patient today.  Concerns regarding medicines are outlined above.  No orders of the defined types were placed in this encounter.  No orders of the defined types were placed in this encounter.    No chief complaint on file.    History of Present Illness:    JAXSYN AZAM is a 79 y.o. male.  Patient has past medical history of nonsustained ventricular tachycardia, essential  hypertension dyslipidemia diabetes mellitus and chronic daily insufficiency.  He denies any problems at this time and takes care of activities of daily living.  No chest pain orthopnea or PND.  No dizziness palpitations or any such symptoms.  At the time of my evaluation, the patient is alert awake oriented and in no distress.  Past Medical History:  Diagnosis Date  . Abnormal thyroid function test 09/25/2018  . Abnormality of gait 12/05/2018  . Acute blood loss anemia   . Acute on chronic renal failure ([place]) 10/13/2018  . AKI (acute kidney injury) ([place]) 09/25/2018  . CAP (community acquired pneumonia) 09/25/2018  . Chronic diastolic congestive heart failure ([place])   . Chronic kidney disease (CKD), stage III (moderate)   . Debility 10/02/2018  . Diabetes mellitus due to underlying condition with unspecified complications ([place]) 02/19/4286  . DM2 (diabetes mellitus, type 2) ([place])   . Duodenal ulcer with hemorrhage   . E-coli UTI 09/25/2018  . Elevated troponin 09/25/2018  . Hematochezia 09/25/2018  . History of GI bleed   . HLD (hyperlipidemia)   . HTN (hypertension)   . Hypoalbuminemia due to protein-calorie malnutrition ([place])   . Hypotension   . Leukocytosis   . Melena   . Nonsustained ventricular tachycardia ([place]) 09/04/2019  . PAF (paroxysmal atrial fibrillation) ([place]) 01/27/2019  . Pressure injury of skin 09/26/2018  . Severe sepsis ([place]) 09/25/2018    Past Surgical History:  Procedure Laterality Date  . ESOPHAGOGASTRODUODENOSCOPY (EGD) WITH PROPOFOL N/A 09/27/2018  Procedure: ESOPHAGOGASTRODUODENOSCOPY (EGD) WITH PROPOFOL;  Surgeon: Doran Stabler, MD;  Location: [facility];  Service: Gastroenterology;  Laterality: N/A;  . HOT HEMOSTASIS N/A 09/27/2018   Procedure: HOT HEMOSTASIS (ARGON PLASMA COAGULATION/BICAP);  Surgeon: Doran Stabler, MD;  Location: [facility];  Service: Gastroenterology;  Laterality: N/A;  . SUBMUCOSAL INJECTION  09/27/2018   Procedure: SUBMUCOSAL INJECTION;  Surgeon:  Doran Stabler, MD;  Location: [facility];  Service: Gastroenterology;;    Current Medications: Current Meds  Medication Sig  . acetaminophen (TYLENOL) 500 MG tablet Take 500 mg by mouth every 6 (six) hours as needed for headache (pain).  Marland Kitchen albuterol (VENTOLIN HFA) 108 (90 Base) MCG/ACT inhaler Inhale 1 puff into the lungs every 6 (six) hours as needed.   Marland Kitchen amiodarone (PACERONE) 200 MG tablet Take 100 mg by mouth daily.  Marland Kitchen atenolol (TENORMIN) 100 MG tablet Take 100 mg by mouth daily.  Marland Kitchen docusate sodium (COLACE) 100 MG capsule Take 1 capsule (100 mg total) by mouth 2 (two) times daily.  . ferrous sulfate 325 (65 FE) MG tablet Take 325 mg by mouth daily with breakfast.  . fluticasone (FLONASE) 50 MCG/ACT nasal spray Place 2 sprays into both nostrils daily.  . furosemide (LASIX) 20 MG tablet Take 20 mg by mouth daily.   Marland Kitchen glimepiride (AMARYL) 1 MG tablet Take 1 tablet (1 mg total) by mouth daily with breakfast.  . iron polysaccharides (NIFEREX) 150 MG capsule Take 150 mg by mouth 2 (two) times daily.  Marland Kitchen latanoprost (XALATAN) 0.005 % ophthalmic solution Place 1 drop into both eyes at bedtime.  Marland Kitchen lisinopril (ZESTRIL) 20 MG tablet Take 0.5 tablets by mouth daily. 1/2 tab daily  . montelukast (SINGULAIR) 10 MG tablet Take 1 tablet (10 mg total) by mouth at bedtime as needed (allergies).  . Multiple Vitamin (MULTIVITAMIN WITH MINERALS) TABS tablet Take 1 tablet by mouth daily. Centrum Silver  . pantoprazole (PROTONIX) 20 MG tablet Take 20 mg by mouth 2 (two) times daily.   . polyethylene glycol (MIRALAX / GLYCOLAX) packet Take 17 g by mouth daily as needed for mild constipation.  . potassium chloride (KLOR-CON) 10 MEQ tablet Take 10 mEq by mouth daily.  . pravastatin (PRAVACHOL) 20 MG tablet Take 20 mg by mouth at bedtime.  . Vitamin D, Ergocalciferol, (DRISDOL) 1.25 MG (50000 UT) CAPS capsule Take 50,000 Units by mouth every Wednesday.     Allergies:   Patient has no known allergies.    Social History   Socioeconomic History  . Marital status: Unknown    Spouse name: Not on file  . Number of children: Not on file  . Years of education: Not on file  . Highest education level: Not on file  Occupational History  . Not on file  Tobacco Use  . Smoking status: Former Smoker    Types: Cigarettes  . Smokeless tobacco: Never Used  . Tobacco comment: Quit 1970  Vaping Use  . Vaping Use: Never used  Substance and Sexual Activity  . Alcohol use: Never  . Drug use: Never  . Sexual activity: Not on file  Other Topics Concern  . Not on file  Social History Narrative  . Not on file   Social Determinants of Health   Financial Resource Strain:   . Difficulty of Paying Living Expenses: Not on file  Food Insecurity:   . Worried About Charity fundraiser in the Last Year: Not on file  . Ran Out of Food  in the Last Year: Not on file  Transportation Needs:   . Lack of Transportation (Medical): Not on file  . Lack of Transportation (Non-Medical): Not on file  Physical Activity:   . Days of Exercise per Week: Not on file  . Minutes of Exercise per Session: Not on file  Stress:   . Feeling of Stress : Not on file  Social Connections:   . Frequency of Communication with Friends and Family: Not on file  . Frequency of Social Gatherings with Friends and Family: Not on file  . Attends Religious Services: Not on file  . Active Member of Clubs or Organizations: Not on file  . Attends Archivist Meetings: Not on file  . Marital Status: Not on file     Family History: The patient's family history includes Diabetes in his son.  ROS:   Please see the history of present illness.    All other systems reviewed and are negative.  EKGs/Labs/Other Studies Reviewed:    The following studies were reviewed today: EKG reveals junctional bradycardia.  Baseline is poor this may be sinus bradycardia also.   Recent Labs: 09/21/2019: ALT 11; BUN 56; Creatinine, Ser 2.85;  Hemoglobin 12.3; Platelets 213; Potassium 5.0; Sodium 142; TSH 4.000  Recent Lipid Panel    Component Value Date/Time   CHOL 179 09/21/2019 0811   TRIG 179 (H) 09/21/2019 0811   HDL 43 09/21/2019 0811   CHOLHDL 4.2 09/21/2019 0811   CHOLHDL 3.8 09/27/2018 0234   VLDL 27 09/27/2018 0234   LDLCALC 105 (H) 09/21/2019 0811    Physical Exam:    VS:  BP 140/80   Pulse (!) 46   Ht 5\' 8"  (1.727 m)   Wt 210 lb 3.2 oz (95.3 kg)   SpO2 95%   BMI 31.96 kg/m     Wt Readings from Last 3 Encounters:  04/14/20 210 lb 3.2 oz (95.3 kg)  12/04/19 212 lb 12.8 oz (96.5 kg)  09/04/19 204 lb 6.4 oz (92.7 kg)     GEN: Patient is in no acute distress HEENT: Normal NECK: No JVD; No carotid bruits LYMPHATICS: No lymphadenopathy CARDIAC: Hear sounds regular, 2/6 systolic murmur at the apex. RESPIRATORY:  Clear to auscultation without rales, wheezing or rhonchi  ABDOMEN: Soft, non-tender, non-distended MUSCULOSKELETAL:  No edema; No deformity  SKIN: Warm and dry NEUROLOGIC:  Alert and oriented x 3 PSYCHIATRIC:  Normal affect   Signed, Jenean Lindau, MD  04/14/2020 8:57 AM    [place]

## 2020-04-14 NOTE — Patient Instructions (Signed)
Medication Instructions:  Your physician has recommended you make the following change in your medication:   Decrease your amiodarone to 100 mg every other day.  *If you need a refill on your cardiac medications before your next appointment, please call your pharmacy*   Lab Work: Your physician recommends that you have labs done in the office today. Your test included  basic metabolic panel, complete blood count, TSH, liver function and lipids.  If you have labs (blood work) drawn today and your tests are completely normal, you will receive your results only by: Marland Kitchen MyChart Message (if you have MyChart) OR . A paper copy in the mail If you have any lab test that is abnormal or we need to change your treatment, we will call you to review the results.   Testing/Procedures: None ordered   Follow-Up: At [facility], you and your health needs are our priority.  As part of our continuing mission to provide you with exceptional heart care, we have created designated Provider Care Teams.  These Care Teams include your primary Cardiologist (physician) and Advanced Practice Providers (APPs -  Physician Assistants and Nurse Practitioners) who all work together to provide you with the care you need, when you need it.  We recommend signing up for the patient portal called "MyChart".  Sign up information is provided on this After Visit Summary.  MyChart is used to connect with patients for Virtual Visits (Telemedicine).  Patients are able to view lab/test results, encounter notes, upcoming appointments, etc.  Non-urgent messages can be sent to your provider as well.   To learn more about what you can do with MyChart, go to [email or web address].    Your next appointment:   6 month(s)  The format for your next appointment:   In Person  Provider:   Jyl Heinz, MD   Other Instructions NA

## 2020-04-15 LAB — CBC WITH DIFFERENTIAL/PLATELET
Basophils Absolute: 0.1 10*3/uL (ref 0.0–0.2)
Basos: 1 %
EOS (ABSOLUTE): 0.1 10*3/uL (ref 0.0–0.4)
Eos: 1 %
Hematocrit: 40.2 % (ref 37.5–51.0)
Hemoglobin: 13 g/dL (ref 13.0–17.7)
Immature Grans (Abs): 0.2 10*3/uL — ABNORMAL HIGH (ref 0.0–0.1)
Immature Granulocytes: 1 %
Lymphocytes Absolute: 3 10*3/uL (ref 0.7–3.1)
Lymphs: 25 %
MCH: 29.1 pg (ref 26.6–33.0)
MCHC: 32.3 g/dL (ref 31.5–35.7)
MCV: 90 fL (ref 79–97)
Monocytes Absolute: 1.6 10*3/uL — ABNORMAL HIGH (ref 0.1–0.9)
Monocytes: 13 %
Neutrophils Absolute: 7.1 10*3/uL — ABNORMAL HIGH (ref 1.4–7.0)
Neutrophils: 59 %
Platelets: 210 10*3/uL (ref 150–450)
RBC: 4.46 x10E6/uL (ref 4.14–5.80)
RDW: 13 % (ref 11.6–15.4)
WBC: 12 10*3/uL — ABNORMAL HIGH (ref 3.4–10.8)

## 2020-04-15 LAB — HEMOGLOBIN A1C
Est. average glucose Bld gHb Est-mCnc: 137 mg/dL
Hgb A1c MFr Bld: 6.4 % — ABNORMAL HIGH (ref 4.8–5.6)

## 2020-04-15 LAB — LIPID PANEL
Chol/HDL Ratio: 5.4 ratio — ABNORMAL HIGH (ref 0.0–5.0)
Cholesterol, Total: 201 mg/dL — ABNORMAL HIGH (ref 100–199)
HDL: 37 mg/dL — ABNORMAL LOW (ref >39)
LDL Chol Calc (NIH): 115 mg/dL — ABNORMAL HIGH (ref 0–99)
Triglycerides: 281 mg/dL — ABNORMAL HIGH (ref 0–149)
VLDL Cholesterol Cal: 49 mg/dL — ABNORMAL HIGH (ref 5–40)

## 2020-04-15 LAB — TSH: TSH: 1.62 u[IU]/mL (ref 0.450–4.500)

## 2020-04-15 LAB — BASIC METABOLIC PANEL
BUN/Creatinine Ratio: 21 (ref 10–24)
BUN: 60 mg/dL — ABNORMAL HIGH (ref 8–27)
CO2: 21 mmol/L (ref 20–29)
Calcium: 8.8 mg/dL (ref 8.6–10.2)
Chloride: 105 mmol/L (ref 96–106)
Creatinine, Ser: 2.89 mg/dL — ABNORMAL HIGH (ref 0.76–1.27)
GFR calc Af Amer: 23 mL/min/{1.73_m2} — ABNORMAL LOW (ref >59)
GFR calc non Af Amer: 19 mL/min/{1.73_m2} — ABNORMAL LOW (ref >59)
Glucose: 89 mg/dL (ref 65–99)
Potassium: 5 mmol/L (ref 3.5–5.2)
Sodium: 141 mmol/L (ref 134–144)

## 2020-04-15 LAB — HEPATIC FUNCTION PANEL
ALT: 9 IU/L (ref 0–44)
AST: 11 IU/L (ref 0–40)
Albumin: 4.4 g/dL (ref 3.6–4.6)
Alkaline Phosphatase: 122 IU/L — ABNORMAL HIGH (ref 48–121)
Bilirubin Total: 0.4 mg/dL (ref 0.0–1.2)
Bilirubin, Direct: 0.13 mg/dL (ref 0.00–0.40)
Total Protein: 6.8 g/dL (ref 6.0–8.5)

## 2020-04-18 ENCOUNTER — Telehealth: Payer: Self-pay | Admitting: Cardiology

## 2020-04-18 MED ORDER — ICOSAPENT ETHYL 1 G PO CAPS: 2.0000 g | ORAL_CAPSULE | Freq: Two times a day (BID) | ORAL | 6 refills | Status: DC

## 2020-04-18 MED ORDER — PRAVASTATIN SODIUM 40 MG PO TABS: 20.0000 mg | ORAL_TABLET | Freq: Every evening | ORAL | 3 refills | Status: DC

## 2020-04-18 NOTE — Telephone Encounter (Signed)
Results reviewed with pt as per Dr. Revankar's note.  Pt verbalized understanding and had no additional questions. Routed to PCP.  

## 2020-04-18 NOTE — Telephone Encounter (Signed)
Erik Morales and Erik Morales are calling wanting to know when Dr. Geraldo Pitter is wanting Erik Morales to follow up. Erik Morales states Erik Morales just received a call that he is to f/u in 6 weeks, but I was unable to find documentation to support this. I advised them of the 6 month f/u, but they are requesting a nurse callback to confirm due to the confusion. Please advise.

## 2020-04-18 NOTE — Addendum Note (Signed)
Addended by: Truddie Hidden on: 04/18/2020 11:58 AM   Modules accepted: Orders

## 2020-04-21 ENCOUNTER — Telehealth: Payer: Self-pay | Admitting: Cardiology

## 2020-04-21 NOTE — Telephone Encounter (Signed)
Stop the medicine and sending for a stat DVT study please thank you

## 2020-04-21 NOTE — Telephone Encounter (Signed)
No log of daily weights available.

## 2020-04-21 NOTE — Telephone Encounter (Signed)
Pt advised to stop Vascepa and that he needs to have a DVT study at the hospital. Pt states that he is not going to the hospital and that this is coming from the medicine. Pt was advised that he could have a blood clot in his leg and that if it not treated appropriately he could have worsening sx, loss of limb, stroke, or loss of life. Pt verba;ized understanding and states if I die then I will be out of this cruel world. Pt advised to call back if symptoms change or he decides he wants to precede with the DVT study.

## 2020-04-21 NOTE — Telephone Encounter (Signed)
Pt states that since starting Vascepa he has swelling in the Lt leg and foot. Pt states that he has pain in the leg, knee and thigh. Denies swelling in the right leg. How do you advise?

## 2020-04-21 NOTE — Addendum Note (Signed)
Addended by: Truddie Hidden on: 04/21/2020 03:44 PM   Modules accepted: Orders

## 2020-04-22 ENCOUNTER — Telehealth: Payer: Self-pay | Admitting: Cardiology

## 2020-04-22 DIAGNOSIS — M79662 Pain in left lower leg: Secondary | ICD-10-CM | POA: Diagnosis not present

## 2020-04-22 NOTE — Telephone Encounter (Signed)
Patient called back because his foot is still swollen. I advised him as well as another lady on the phone of Shonda's advice for him to go to the hospital for DVT. Patient finally agreed and is on the way to the hospital.

## 2020-04-22 NOTE — Telephone Encounter (Signed)
Tried calling Ava back at this time. I called back at the number provided and was sent to four different people before I was sent to the outpatient imaging center and was then sent to voicemail where I left a message for Ava. I called the patient at both numbers provided but was not able to reach him and no voicemail was available. I called the patients granddaughter and let her know what was going on and asked that she please have the patient proceed to the ED and to call us with any questions he may have. She states that she will do her best to get in touch with him to let him know.

## 2020-04-22 NOTE — Telephone Encounter (Signed)
Per Selena:   Ava advised patient to proceed to the ED at this time and he verbalized understanding.

## 2020-04-22 NOTE — Telephone Encounter (Signed)
Ava from [facility] is calling stating the patient came in stating he is supposed to be getting an Xray and she is needing the active request sent over. I advised her I do not have an active request for an xray, but will send a message. Please advise.

## 2020-04-22 NOTE — Telephone Encounter (Signed)
New meswsage:      Erik Morales from [facility] needs patient order of a xray to be sent to [phone]

## 2020-04-22 NOTE — Telephone Encounter (Signed)
Ava calling back stating the patient is still there for his x ray. I informed her I still did not see an order for an x ray, just labs. She is now requesting the lab orders be faxed. She would like a call back when they are sent.

## 2020-05-09 ENCOUNTER — Encounter (HOSPITAL_COMMUNITY): Payer: Self-pay | Admitting: Emergency Medicine

## 2020-05-09 ENCOUNTER — Other Ambulatory Visit: Payer: Self-pay

## 2020-05-09 ENCOUNTER — Inpatient Hospital Stay (HOSPITAL_COMMUNITY)
Admission: EM | Admit: 2020-05-09 | Discharge: 2020-05-20 | DRG: 689 | Disposition: A | Discharge status: Skilled Nursing Facility | Payer: Medicare Other | Attending: Family Medicine | Admitting: Family Medicine

## 2020-05-09 DIAGNOSIS — Z833 Family history of diabetes mellitus: Secondary | ICD-10-CM | POA: Diagnosis not present

## 2020-05-09 DIAGNOSIS — I10 Essential (primary) hypertension: Secondary | ICD-10-CM | POA: Diagnosis present

## 2020-05-09 DIAGNOSIS — Z8711 Personal history of peptic ulcer disease: Secondary | ICD-10-CM

## 2020-05-09 DIAGNOSIS — G928 Other toxic encephalopathy: Secondary | ICD-10-CM | POA: Diagnosis not present

## 2020-05-09 DIAGNOSIS — Y738 Miscellaneous gastroenterology and urology devices associated with adverse incidents, not elsewhere classified: Secondary | ICD-10-CM | POA: Diagnosis present

## 2020-05-09 DIAGNOSIS — Z79899 Other long term (current) drug therapy: Secondary | ICD-10-CM | POA: Diagnosis not present

## 2020-05-09 DIAGNOSIS — I480 Paroxysmal atrial fibrillation: Secondary | ICD-10-CM | POA: Diagnosis present

## 2020-05-09 DIAGNOSIS — N136 Pyonephrosis: Secondary | ICD-10-CM | POA: Diagnosis not present

## 2020-05-09 DIAGNOSIS — G9340 Encephalopathy, unspecified: Secondary | ICD-10-CM | POA: Diagnosis not present

## 2020-05-09 DIAGNOSIS — Y92239 Unspecified place in hospital as the place of occurrence of the external cause: Secondary | ICD-10-CM | POA: Diagnosis present

## 2020-05-09 DIAGNOSIS — R4182 Altered mental status, unspecified: Secondary | ICD-10-CM | POA: Diagnosis not present

## 2020-05-09 DIAGNOSIS — I130 Hypertensive heart and chronic kidney disease with heart failure and stage 1 through stage 4 chronic kidney disease, or unspecified chronic kidney disease: Secondary | ICD-10-CM | POA: Diagnosis not present

## 2020-05-09 DIAGNOSIS — R410 Disorientation, unspecified: Secondary | ICD-10-CM | POA: Diagnosis not present

## 2020-05-09 DIAGNOSIS — E1122 Type 2 diabetes mellitus with diabetic chronic kidney disease: Secondary | ICD-10-CM | POA: Diagnosis not present

## 2020-05-09 DIAGNOSIS — Z043 Encounter for examination and observation following other accident: Secondary | ICD-10-CM | POA: Diagnosis not present

## 2020-05-09 DIAGNOSIS — I5032 Chronic diastolic (congestive) heart failure: Secondary | ICD-10-CM | POA: Diagnosis not present

## 2020-05-09 DIAGNOSIS — N179 Acute kidney failure, unspecified: Secondary | ICD-10-CM

## 2020-05-09 DIAGNOSIS — R531 Weakness: Secondary | ICD-10-CM | POA: Diagnosis not present

## 2020-05-09 DIAGNOSIS — R319 Hematuria, unspecified: Secondary | ICD-10-CM | POA: Diagnosis not present

## 2020-05-09 DIAGNOSIS — E876 Hypokalemia: Secondary | ICD-10-CM | POA: Diagnosis not present

## 2020-05-09 DIAGNOSIS — R001 Bradycardia, unspecified: Secondary | ICD-10-CM

## 2020-05-09 DIAGNOSIS — Z87891 Personal history of nicotine dependence: Secondary | ICD-10-CM

## 2020-05-09 DIAGNOSIS — I82502 Chronic embolism and thrombosis of unspecified deep veins of left lower extremity: Secondary | ICD-10-CM | POA: Diagnosis not present

## 2020-05-09 DIAGNOSIS — N400 Enlarged prostate without lower urinary tract symptoms: Secondary | ICD-10-CM | POA: Diagnosis present

## 2020-05-09 DIAGNOSIS — E785 Hyperlipidemia, unspecified: Secondary | ICD-10-CM | POA: Diagnosis not present

## 2020-05-09 DIAGNOSIS — N184 Chronic kidney disease, stage 4 (severe): Secondary | ICD-10-CM | POA: Diagnosis not present

## 2020-05-09 DIAGNOSIS — R52 Pain, unspecified: Secondary | ICD-10-CM | POA: Diagnosis not present

## 2020-05-09 DIAGNOSIS — E860 Dehydration: Secondary | ICD-10-CM | POA: Diagnosis present

## 2020-05-09 DIAGNOSIS — I4891 Unspecified atrial fibrillation: Secondary | ICD-10-CM | POA: Diagnosis not present

## 2020-05-09 DIAGNOSIS — R0902 Hypoxemia: Secondary | ICD-10-CM | POA: Diagnosis not present

## 2020-05-09 DIAGNOSIS — Z743 Need for continuous supervision: Secondary | ICD-10-CM | POA: Diagnosis not present

## 2020-05-09 DIAGNOSIS — E088 Diabetes mellitus due to underlying condition with unspecified complications: Secondary | ICD-10-CM | POA: Diagnosis present

## 2020-05-09 DIAGNOSIS — R609 Edema, unspecified: Secondary | ICD-10-CM | POA: Diagnosis not present

## 2020-05-09 DIAGNOSIS — I499 Cardiac arrhythmia, unspecified: Secondary | ICD-10-CM | POA: Diagnosis not present

## 2020-05-09 DIAGNOSIS — T839XXA Unspecified complication of genitourinary prosthetic device, implant and graft, initial encounter: Secondary | ICD-10-CM | POA: Diagnosis not present

## 2020-05-09 DIAGNOSIS — Z20822 Contact with and (suspected) exposure to covid-19: Secondary | ICD-10-CM | POA: Diagnosis present

## 2020-05-09 LAB — CBC
HCT: 38.4 % — ABNORMAL LOW (ref 39.0–52.0)
Hemoglobin: 12.3 g/dL — ABNORMAL LOW (ref 13.0–17.0)
MCH: 30.4 pg (ref 26.0–34.0)
MCHC: 32 g/dL (ref 30.0–36.0)
MCV: 94.8 fL (ref 80.0–100.0)
Platelets: 267 10*3/uL (ref 150–400)
RBC: 4.05 MIL/uL — ABNORMAL LOW (ref 4.22–5.81)
RDW: 13.6 % (ref 11.5–15.5)
WBC: 13.7 10*3/uL — ABNORMAL HIGH (ref 4.0–10.5)
nRBC: 0 % (ref 0.0–0.2)

## 2020-05-09 LAB — BASIC METABOLIC PANEL
Anion gap: 12 (ref 5–15)
BUN: 47 mg/dL — ABNORMAL HIGH (ref 8–23)
CO2: 17 mmol/L — ABNORMAL LOW (ref 22–32)
Calcium: 8.9 mg/dL (ref 8.9–10.3)
Chloride: 111 mmol/L (ref 98–111)
Creatinine, Ser: 2.53 mg/dL — ABNORMAL HIGH (ref 0.61–1.24)
GFR calc Af Amer: 27 mL/min — ABNORMAL LOW (ref >60)
GFR calc non Af Amer: 23 mL/min — ABNORMAL LOW (ref >60)
Glucose, Bld: 140 mg/dL — ABNORMAL HIGH (ref 70–99)
Potassium: 4.2 mmol/L (ref 3.5–5.1)
Sodium: 140 mmol/L (ref 135–145)

## 2020-05-09 LAB — LACTIC ACID, PLASMA: Lactic Acid, Venous: 0.9 mmol/L (ref 0.5–1.9)

## 2020-05-09 NOTE — ED Triage Notes (Signed)
Patient from home, fell 3 days ago.  Patient is altered, but per family that is normal and has been going on for about one year.  Patient is having trouble walking and moving around since the fall.  Increased weakness per family.  Patient has swelling to left leg, which is normal per family.

## 2020-05-10 ENCOUNTER — Encounter (HOSPITAL_COMMUNITY): Payer: Self-pay | Admitting: Family Medicine

## 2020-05-10 ENCOUNTER — Emergency Department (HOSPITAL_BASED_OUTPATIENT_CLINIC_OR_DEPARTMENT_OTHER): Payer: Medicare Other

## 2020-05-10 ENCOUNTER — Emergency Department (HOSPITAL_COMMUNITY): Payer: Medicare Other

## 2020-05-10 DIAGNOSIS — G9340 Encephalopathy, unspecified: Secondary | ICD-10-CM | POA: Diagnosis not present

## 2020-05-10 DIAGNOSIS — I10 Essential (primary) hypertension: Secondary | ICD-10-CM

## 2020-05-10 DIAGNOSIS — I82502 Chronic embolism and thrombosis of unspecified deep veins of left lower extremity: Secondary | ICD-10-CM

## 2020-05-10 DIAGNOSIS — R609 Edema, unspecified: Secondary | ICD-10-CM

## 2020-05-10 DIAGNOSIS — Z043 Encounter for examination and observation following other accident: Secondary | ICD-10-CM | POA: Diagnosis not present

## 2020-05-10 DIAGNOSIS — N184 Chronic kidney disease, stage 4 (severe): Secondary | ICD-10-CM

## 2020-05-10 DIAGNOSIS — R001 Bradycardia, unspecified: Secondary | ICD-10-CM | POA: Diagnosis present

## 2020-05-10 DIAGNOSIS — R531 Weakness: Secondary | ICD-10-CM | POA: Diagnosis not present

## 2020-05-10 DIAGNOSIS — I480 Paroxysmal atrial fibrillation: Secondary | ICD-10-CM

## 2020-05-10 DIAGNOSIS — E088 Diabetes mellitus due to underlying condition with unspecified complications: Secondary | ICD-10-CM

## 2020-05-10 LAB — URINALYSIS, ROUTINE W REFLEX MICROSCOPIC
Bilirubin Urine: NEGATIVE
Glucose, UA: NEGATIVE mg/dL
Hgb urine dipstick: NEGATIVE
Ketones, ur: 5 mg/dL — AB
Leukocytes,Ua: NEGATIVE
Nitrite: NEGATIVE
Protein, ur: NEGATIVE mg/dL
Specific Gravity, Urine: 1.014 (ref 1.005–1.030)
pH: 5 (ref 5.0–8.0)

## 2020-05-10 LAB — I-STAT VENOUS BLOOD GAS, ED
Acid-base deficit: 6 mmol/L — ABNORMAL HIGH (ref 0.0–2.0)
Bicarbonate: 19.2 mmol/L — ABNORMAL LOW (ref 20.0–28.0)
Calcium, Ion: 1.17 mmol/L (ref 1.15–1.40)
HCT: 34 % — ABNORMAL LOW (ref 39.0–52.0)
Hemoglobin: 11.6 g/dL — ABNORMAL LOW (ref 13.0–17.0)
O2 Saturation: 48 %
Potassium: 4 mmol/L (ref 3.5–5.1)
Sodium: 146 mmol/L — ABNORMAL HIGH (ref 135–145)
TCO2: 20 mmol/L — ABNORMAL LOW (ref 22–32)
pCO2, Ven: 35.2 mmHg — ABNORMAL LOW (ref 44.0–60.0)
pH, Ven: 7.345 (ref 7.250–7.430)
pO2, Ven: 27 mmHg — CL (ref 32.0–45.0)

## 2020-05-10 LAB — SARS CORONAVIRUS 2 BY RT PCR (HOSPITAL ORDER, PERFORMED IN ~~LOC~~ HOSPITAL LAB): SARS Coronavirus 2: NEGATIVE

## 2020-05-10 LAB — AMMONIA: Ammonia: 19 umol/L (ref 9–35)

## 2020-05-10 LAB — CBG MONITORING, ED: Glucose-Capillary: 105 mg/dL — ABNORMAL HIGH (ref 70–99)

## 2020-05-10 MED ORDER — HEPARIN SODIUM (PORCINE) 5000 UNIT/ML IJ SOLN
5000.0000 [IU] | Freq: Three times a day (TID) | INTRAMUSCULAR | Status: DC
  Administered 2020-05-11 – 2020-05-16 (×17): 5000 [IU] via SUBCUTANEOUS
  Filled 2020-05-10 (×17): qty 1

## 2020-05-10 MED ORDER — SODIUM CHLORIDE 0.9% FLUSH
3.0000 mL | Freq: Two times a day (BID) | INTRAVENOUS | Status: DC
  Administered 2020-05-10 – 2020-05-20 (×13): 3 mL via INTRAVENOUS

## 2020-05-10 MED ORDER — POLYETHYLENE GLYCOL 3350 17 G PO PACK: 17.0000 g | PACK | Freq: Every day | ORAL | Status: DC | PRN

## 2020-05-10 MED ORDER — HALOPERIDOL LACTATE 5 MG/ML IJ SOLN: 2.0000 mg | Freq: Four times a day (QID) | INTRAMUSCULAR | Status: DC | PRN

## 2020-05-10 MED ORDER — ACETAMINOPHEN 325 MG PO TABS
650.0000 mg | ORAL_TABLET | Freq: Four times a day (QID) | ORAL | Status: DC | PRN
  Administered 2020-05-13 – 2020-05-20 (×8): 650 mg via ORAL
  Filled 2020-05-10 (×9): qty 2

## 2020-05-10 MED ORDER — LATANOPROST 0.005 % OP SOLN
1.0000 [drp] | Freq: Every day | OPHTHALMIC | Status: DC
  Administered 2020-05-11 – 2020-05-19 (×9): 1 [drp] via OPHTHALMIC
  Filled 2020-05-10: qty 2.5

## 2020-05-10 MED ORDER — MONTELUKAST SODIUM 10 MG PO TABS
10.0000 mg | ORAL_TABLET | Freq: Every day | ORAL | Status: DC
  Administered 2020-05-11 – 2020-05-19 (×9): 10 mg via ORAL
  Filled 2020-05-10 (×10): qty 1

## 2020-05-10 MED ORDER — AMIODARONE HCL 200 MG PO TABS: 100.0000 mg | ORAL_TABLET | Freq: Every day | ORAL | Status: DC

## 2020-05-10 MED ORDER — SODIUM CHLORIDE 0.9 % IV BOLUS
500.0000 mL | Freq: Once | INTRAVENOUS | Status: AC
  Administered 2020-05-10: 500 mL via INTRAVENOUS

## 2020-05-10 MED ORDER — PRAVASTATIN SODIUM 10 MG PO TABS
10.0000 mg | ORAL_TABLET | Freq: Every day | ORAL | Status: DC
  Administered 2020-05-11 – 2020-05-20 (×10): 10 mg via ORAL
  Filled 2020-05-10 (×10): qty 1

## 2020-05-10 MED ORDER — SODIUM CHLORIDE 0.9 % IV BOLUS
1000.0000 mL | Freq: Once | INTRAVENOUS | Status: AC
  Administered 2020-05-10: 1000 mL via INTRAVENOUS

## 2020-05-10 MED ORDER — INSULIN ASPART 100 UNIT/ML ~~LOC~~ SOLN
0.0000 [IU] | Freq: Three times a day (TID) | SUBCUTANEOUS | Status: DC
  Administered 2020-05-13 – 2020-05-14 (×3): 1 [IU] via SUBCUTANEOUS
  Administered 2020-05-15: 3 [IU] via SUBCUTANEOUS
  Administered 2020-05-15 – 2020-05-16 (×4): 2 [IU] via SUBCUTANEOUS
  Administered 2020-05-17: 3 [IU] via SUBCUTANEOUS
  Administered 2020-05-17 – 2020-05-20 (×6): 1 [IU] via SUBCUTANEOUS

## 2020-05-10 MED ORDER — AMIODARONE HCL 100 MG PO TABS: 100.0000 mg | ORAL_TABLET | Freq: Every day | ORAL | Status: DC

## 2020-05-10 MED ORDER — ALBUTEROL SULFATE HFA 108 (90 BASE) MCG/ACT IN AERS: 1.0000 | INHALATION_SPRAY | Freq: Four times a day (QID) | RESPIRATORY_TRACT | Status: DC | PRN

## 2020-05-10 MED ORDER — HALOPERIDOL LACTATE 5 MG/ML IJ SOLN
2.0000 mg | Freq: Four times a day (QID) | INTRAMUSCULAR | Status: DC | PRN
  Administered 2020-05-12: 2 mg via INTRAVENOUS
  Filled 2020-05-10: qty 1

## 2020-05-10 MED ORDER — PANTOPRAZOLE SODIUM 20 MG PO TBEC
20.0000 mg | DELAYED_RELEASE_TABLET | Freq: Two times a day (BID) | ORAL | Status: DC
  Administered 2020-05-11 – 2020-05-20 (×19): 20 mg via ORAL
  Filled 2020-05-10 (×20): qty 1

## 2020-05-10 MED ORDER — HALOPERIDOL LACTATE 5 MG/ML IJ SOLN
5.0000 mg | Freq: Once | INTRAMUSCULAR | Status: AC
  Administered 2020-05-10: 5 mg via INTRAVENOUS
  Filled 2020-05-10: qty 1

## 2020-05-10 MED ORDER — SODIUM CHLORIDE 0.9 % IV BOLUS
1000.0000 mL | Freq: Once | INTRAVENOUS | Status: AC
  Administered 2020-05-11: 1000 mL via INTRAVENOUS

## 2020-05-10 MED ORDER — ONDANSETRON HCL 4 MG PO TABS: 4.0000 mg | ORAL_TABLET | Freq: Four times a day (QID) | ORAL | Status: DC | PRN

## 2020-05-10 MED ORDER — SODIUM CHLORIDE 0.9% FLUSH
3.0000 mL | Freq: Two times a day (BID) | INTRAVENOUS | Status: DC
  Administered 2020-05-10 – 2020-05-17 (×6): 3 mL via INTRAVENOUS

## 2020-05-10 MED ORDER — ACETAMINOPHEN 650 MG RE SUPP: 650.0000 mg | Freq: Four times a day (QID) | RECTAL | Status: DC | PRN

## 2020-05-10 MED ORDER — SODIUM CHLORIDE 0.9 % IV SOLN
250.0000 mL | INTRAVENOUS | Status: DC | PRN
  Administered 2020-05-18: 250 mL via INTRAVENOUS
  Administered 2020-05-20: 10 mL via INTRAVENOUS

## 2020-05-10 MED ORDER — SODIUM CHLORIDE 0.9% FLUSH: 3.0000 mL | INTRAVENOUS | Status: DC | PRN

## 2020-05-10 MED ORDER — ONDANSETRON HCL 4 MG/2ML IJ SOLN: 4.0000 mg | Freq: Four times a day (QID) | INTRAMUSCULAR | Status: DC | PRN

## 2020-05-10 NOTE — ED Provider Notes (Signed)
[facility] EMERGENCY DEPARTMENT Provider Note   CSN: [id number] Arrival date & time: 05/09/20  2153     History Chief Complaint  Patient presents with  . Fall  . Leg Pain  . Weakness    Erik Morales is a 79 y.o. male with past medical history significant for acute on chronic renal failure, chronic diastolic CHF, CKD stage III, diabetes, history of duodenal ulcer with hemorrhage, paroxysmal A. Fib. Cardiologist is Dr. Geraldo Pitter.  HPI  Level 5 caveat applies, pt unable to provide history 2/2 altered mental status.  Obtained history from patient's granddaughter over the phone.  She states her brother last saw the patient 3 days ago.  At that time he thought he was off.  He wanted the patient to be seen in the emergency room but patient refused.  It is possible that patient had a fall on escalator at some point in the last 2 days, however granddaughter is unsure and is unable to give any further information about a possible fall.  She does state that patient lives with his wife at home who also has dementia.  They have refused home health care in the past and do not let family help out to either.  Patient is still driving.  He is typically alert and oriented to his self, location and year.  He is not on any blood thinners currently as he was on one in the past and had a GI bleed.      Past Medical History:  Diagnosis Date  . Abnormal thyroid function test 09/25/2018  . Abnormality of gait 12/05/2018  . Acute blood loss anemia   . Acute on chronic renal failure ([place]) 10/13/2018  . AKI (acute kidney injury) ([place]) 09/25/2018  . CAP (community acquired pneumonia) 09/25/2018  . Chronic diastolic congestive heart failure ([place])   . Chronic kidney disease (CKD), stage III (moderate)   . Debility 10/02/2018  . Diabetes mellitus due to underlying condition with unspecified complications ([place]) 12/19/8839  . DM2 (diabetes mellitus, type 2) ([place])   . Duodenal ulcer with hemorrhage   .  E-coli UTI 09/25/2018  . Elevated troponin 09/25/2018  . Hematochezia 09/25/2018  . History of GI bleed   . HLD (hyperlipidemia)   . HTN (hypertension)   . Hypoalbuminemia due to protein-calorie malnutrition ([place])   . Hypotension   . Leukocytosis   . Melena   . Nonsustained ventricular tachycardia ([place]) 09/04/2019  . PAF (paroxysmal atrial fibrillation) ([place]) 01/27/2019  . Pressure injury of skin 09/26/2018  . Severe sepsis ([place]) 09/25/2018    Patient Active Problem List   Diagnosis Date Noted  . Nonsustained ventricular tachycardia ([place]) 09/04/2019  . PAF (paroxysmal atrial fibrillation) ([place]) 01/27/2019  . Diabetes mellitus due to underlying condition with unspecified complications ([place]) 66/01/3015  . Abnormality of gait 12/05/2018  . Acute on chronic renal failure ([place]) 10/13/2018  . Hypotension   . Chronic kidney disease (CKD), stage III (moderate)   . Hypoalbuminemia due to protein-calorie malnutrition ([place])   . History of GI bleed   . Chronic diastolic congestive heart failure ([place])   . Debility 10/02/2018  . Leukocytosis   . Duodenal ulcer with hemorrhage   . Melena   . Acute blood loss anemia   . Pressure injury of skin 09/26/2018  . Hematochezia 09/25/2018  . AKI (acute kidney injury) ([place]) 09/25/2018  . Severe sepsis ([place]) 09/25/2018  . Elevated troponin 09/25/2018  . Abnormal thyroid function test 09/25/2018  .  HTN (hypertension) 09/25/2018  . E-coli UTI 09/25/2018  . CAP (community acquired pneumonia) 09/25/2018    Past Surgical History:  Procedure Laterality Date  . ESOPHAGOGASTRODUODENOSCOPY (EGD) WITH PROPOFOL N/A 09/27/2018   Procedure: ESOPHAGOGASTRODUODENOSCOPY (EGD) WITH PROPOFOL;  Surgeon: Doran Stabler, MD;  Location: [facility];  Service: Gastroenterology;  Laterality: N/A;  . HOT HEMOSTASIS N/A 09/27/2018   Procedure: HOT HEMOSTASIS (ARGON PLASMA COAGULATION/BICAP);  Surgeon: Doran Stabler, MD;  Location: [facility];  Service: Gastroenterology;   Laterality: N/A;  . SUBMUCOSAL INJECTION  09/27/2018   Procedure: SUBMUCOSAL INJECTION;  Surgeon: Doran Stabler, MD;  Location: [facility] ENDOSCOPY;  Service: Gastroenterology;;       Family History  Problem Relation Age of Onset  . Diabetes Son     Social History   Tobacco Use  . Smoking status: Former Smoker    Types: Cigarettes  . Smokeless tobacco: Never Used  . Tobacco comment: Quit 1970  Vaping Use  . Vaping Use: Never used  Substance Use Topics  . Alcohol use: Never  . Drug use: Never    Home Medications Prior to Admission medications   Medication Sig Start Date End Date Taking? Authorizing Provider  amiodarone (PACERONE) 200 MG tablet Take 100 mg by mouth daily.   Yes [provider]  atenolol (TENORMIN) 100 MG tablet Take 100 mg by mouth daily.   Yes [provider]  docusate sodium (COLACE) 100 MG capsule Take 1 capsule (100 mg total) by mouth 2 (two) times daily. 10/10/18  Yes Love, Ivan Anchors, PA-C  ferrous sulfate 325 (65 FE) MG tablet Take 325 mg by mouth daily with breakfast.   Yes [provider]  fluticasone (FLONASE) 50 MCG/ACT nasal spray Place 2 sprays into both nostrils daily. 11/14/18  Yes [provider]  glimepiride (AMARYL) 1 MG tablet Take 1 tablet (1 mg total) by mouth daily with breakfast. 10/11/18  Yes Love, Ivan Anchors, PA-C  icosapent Ethyl (VASCEPA) 1 g capsule Take 2 g by mouth 2 (two) times daily.   Yes [provider]  latanoprost (XALATAN) 0.005 % ophthalmic solution Place 1 drop into both eyes at bedtime. 10/10/18  Yes Love, Ivan Anchors, PA-C  lisinopril (ZESTRIL) 20 MG tablet Take 20 mg by mouth daily.  05/26/19  Yes [provider]  montelukast (SINGULAIR) 10 MG tablet Take 1 tablet (10 mg total) by mouth at bedtime as needed (allergies). 10/10/18  Yes Love, Ivan Anchors, PA-C  Multiple Vitamin (MULTIVITAMIN WITH MINERALS) TABS tablet Take 1 tablet by mouth daily. Centrum Silver   Yes [provider]  pantoprazole (PROTONIX) 20 MG tablet Take 20 mg by mouth 2 (two) times daily.    Yes [provider]  polyethylene glycol (MIRALAX / GLYCOLAX) packet Take 17 g by mouth daily as needed for mild constipation. 10/10/18  Yes Love, Ivan Anchors, PA-C  potassium chloride (KLOR-CON) 10 MEQ tablet Take 10 mEq by mouth daily. 05/26/19  Yes [provider]  pravastatin (PRAVACHOL) 40 MG tablet Take 0.5 tablets (20 mg total) by mouth at bedtime. 04/18/20  Yes Revankar, Reita Cliche, MD  Vitamin D, Ergocalciferol, (DRISDOL) 1.25 MG (50000 UT) CAPS capsule Take 50,000 Units by mouth every 7 (seven) days.    Yes [provider]  acetaminophen (TYLENOL) 500 MG tablet Take 500 mg by mouth every 6 (six) hours as needed for headache (pain).    [provider]  albuterol (VENTOLIN HFA) 108 (90 Base) MCG/ACT inhaler Inhale  1 puff into the lungs every 6 (six) hours as needed for shortness of breath.  02/06/19   [provider]  amiodarone (PACERONE) 100 MG tablet Take 1 tablet (100 mg total) by mouth every other day. Patient not taking: Reported on 05/10/2020 04/14/20   Revankar, Reita Cliche, MD    Allergies    Patient has no known allergies.  Review of Systems   Review of Systems  Unable to perform ROS: Mental status change    Physical Exam Updated Vital Signs BP 113/70   Pulse (!) 44   Temp 98.1 F (36.7 C) (Oral)   Resp 17   SpO2 98%   Physical Exam Vitals and nursing note reviewed.  Constitutional:      General: He is not in acute distress.    Appearance: He is not ill-appearing.  HENT:     Head: Normocephalic and atraumatic.     Right Ear: Tympanic membrane and external ear normal.     Left Ear: Tympanic membrane and external ear normal.     Nose: Nose normal.     Mouth/Throat:     Mouth: Mucous membranes are dry.     Pharynx: Oropharynx is clear.  Eyes:     General: No scleral icterus.       Right eye: No discharge.        Left eye: No discharge.      Extraocular Movements: Extraocular movements intact.     Conjunctiva/sclera: Conjunctivae normal.     Pupils: Pupils are equal, round, and reactive to light.  Neck:     Vascular: No JVD.  Cardiovascular:     Rate and Rhythm: Regular rhythm. Bradycardia present.     Pulses: Normal pulses.          Radial pulses are 2+ on the right side and 2+ on the left side.     Heart sounds: Normal heart sounds.  Pulmonary:     Comments: Lungs clear to auscultation in all fields. Symmetric chest rise. No wheezing, rales, or rhonchi. Abdominal:     Comments: Abdomen is soft, non-distended, and non-tender in all quadrants. No rigidity, no guarding. No peritoneal signs.  Musculoskeletal:        General: Normal range of motion.     Cervical back: Normal range of motion.     Comments: Nonpitting left lower extremity edema extending to left knee.  Skin:    General: Skin is warm and dry.     Capillary Refill: Capillary refill takes less than 2 seconds.     Comments: Equal tactile temperature in all extremities  Neurological:     GCS: GCS eye subscore is 4. GCS verbal subscore is 5. GCS motor subscore is 6.     Comments: Fluent speech, no facial droop.  Patient is alert to self only.  He thinks the year is 72 and that he is in [place].  He is able to follow commands.  Strong and equal grip strength in bilateral upper and lower extremities.  Cranial nerves II through XII intact  Psychiatric:        Behavior: Behavior normal.     ED Results / Procedures / Treatments   Labs (all labs ordered are listed, but only abnormal results are displayed) Labs Reviewed  BASIC METABOLIC PANEL - Abnormal; Notable for the following components:      Result Value   CO2 17 (*)    Glucose, Bld 140 (*)    BUN 47 (*)  Creatinine, Ser 2.53 (*)    GFR calc non Af Amer 23 (*)    GFR calc Af Amer 27 (*)    All other components within normal limits  CBC - Abnormal; Notable for the following components:   WBC 13.7  (*)    RBC 4.05 (*)    Hemoglobin 12.3 (*)    HCT 38.4 (*)    All other components within normal limits  I-STAT VENOUS BLOOD GAS, ED - Abnormal; Notable for the following components:   pCO2, Ven 35.2 (*)    pO2, Ven 27.0 (*)    Bicarbonate 19.2 (*)    TCO2 20 (*)    Acid-base deficit 6.0 (*)    Sodium 146 (*)    HCT 34.0 (*)    Hemoglobin 11.6 (*)    All other components within normal limits  SARS CORONAVIRUS 2 BY RT PCR (HOSPITAL ORDER, [facility])  LACTIC ACID, PLASMA  AMMONIA  URINALYSIS, ROUTINE W REFLEX MICROSCOPIC  CBG MONITORING, ED    EKG EKG Interpretation  Date/Time:  Tuesday May 10 2020 15:10:48 EDT Ventricular Rate:  43 PR Interval:    QRS Duration: 153 QT Interval:  551 QTC Calculation: 466 R Axis:   70 Text Interpretation: Sinus bradycardia Right bundle branch block Abnormal inferior Q waves Since last tracing rate slower Confirmed by Isla Pence [id number]) on 05/10/2020 3:13:40 PM   Radiology CT Head Wo Contrast  Result Date: 05/10/2020 CLINICAL DATA:  Fall 3 days ago, altered. Difficulty walking and increased weakness per family. Left leg swelling. EXAM: CT HEAD WITHOUT CONTRAST CT CERVICAL SPINE WITHOUT CONTRAST TECHNIQUE: Multidetector CT imaging of the head and cervical spine was performed following the standard protocol without intravenous contrast. Multiplanar CT image reconstructions of the cervical spine were also generated. COMPARISON:  None. FINDINGS: CT HEAD FINDINGS Brain: Cerebral ventricle sizes are concordant with the degree of cerebral volume loss. Patchy and confluent areas of decreased attenuation are noted throughout the deep and periventricular white matter of the cerebral hemispheres bilaterally, compatible with chronic microvascular ischemic disease. No evidence of large-territorial acute infarction. No parenchymal hemorrhage. No mass lesion. No extra-axial collection. No mass effect or midline shift. No  hydrocephalus. Basilar cisterns are patent. Vascular: No hyperdense vessel or unexpected calcification. Skull: Negative for fracture or focal lesion. Sinuses/Orbits: Mucosal thickening with likely inspissated mucus within the left sphenoid sinus. Otherwise paranasal sinuses mastoid air cells are clear with improved aeration compared to prior CT. Other: None CT CERVICAL SPINE FINDINGS Alignment: Normal.  Patient's head appears to be rotated. Skull base and vertebrae: Vague cortical irregularity on sagittal view posterior to the base of dens likely represents an old healed fracture versus degenerative changes (8:27). Multilevel discontinues bulky osteophyte formation well-corticated. Multilevel facet arthropathy and uncovertebral arthropathy. No acute fracture. No primary bone lesion or focal pathologic process. Soft tissues and spinal canal: No prevertebral fluid or swelling. No visible canal hematoma. Disc levels: Multilevel intervertebral disc space narrowing mild-to-moderate. Upper chest: Negative. Other: No acute displaced fracture of the visualized ribs. IMPRESSION: 1. No acute intracranial abnormality. 2. No acute displaced fracture or traumatic listhesis of the cervical spine. Electronically Signed   By: Iven Finn M.D.   On: 05/10/2020 18:50   CT Cervical Spine Wo Contrast  Result Date: 05/10/2020 CLINICAL DATA:  Fall 3 days ago, altered. Difficulty walking and increased weakness per family. Left leg swelling. EXAM: CT HEAD WITHOUT CONTRAST CT CERVICAL SPINE WITHOUT CONTRAST TECHNIQUE: Multidetector CT imaging  of the head and cervical spine was performed following the standard protocol without intravenous contrast. Multiplanar CT image reconstructions of the cervical spine were also generated. COMPARISON:  None. FINDINGS: CT HEAD FINDINGS Brain: Cerebral ventricle sizes are concordant with the degree of cerebral volume loss. Patchy and confluent areas of decreased attenuation are noted throughout the  deep and periventricular white matter of the cerebral hemispheres bilaterally, compatible with chronic microvascular ischemic disease. No evidence of large-territorial acute infarction. No parenchymal hemorrhage. No mass lesion. No extra-axial collection. No mass effect or midline shift. No hydrocephalus. Basilar cisterns are patent. Vascular: No hyperdense vessel or unexpected calcification. Skull: Negative for fracture or focal lesion. Sinuses/Orbits: Mucosal thickening with likely inspissated mucus within the left sphenoid sinus. Otherwise paranasal sinuses mastoid air cells are clear with improved aeration compared to prior CT. Other: None CT CERVICAL SPINE FINDINGS Alignment: Normal.  Patient's head appears to be rotated. Skull base and vertebrae: Vague cortical irregularity on sagittal view posterior to the base of dens likely represents an old healed fracture versus degenerative changes (8:27). Multilevel discontinues bulky osteophyte formation well-corticated. Multilevel facet arthropathy and uncovertebral arthropathy. No acute fracture. No primary bone lesion or focal pathologic process. Soft tissues and spinal canal: No prevertebral fluid or swelling. No visible canal hematoma. Disc levels: Multilevel intervertebral disc space narrowing mild-to-moderate. Upper chest: Negative. Other: No acute displaced fracture of the visualized ribs. IMPRESSION: 1. No acute intracranial abnormality. 2. No acute displaced fracture or traumatic listhesis of the cervical spine. Electronically Signed   By: Iven Finn M.D.   On: 05/10/2020 18:50   DG Chest Portable 1 View  Result Date: 05/10/2020 CLINICAL DATA:  Weakness fall EXAM: PORTABLE CHEST 1 VIEW COMPARISON:  October 03, 2018 FINDINGS: The cardiomediastinal silhouette is unchanged and enlarged in contour.Tortuous thoracic aorta. No pleural effusion. No pneumothorax. No acute pleuroparenchymal abnormality. Visualized abdomen is unremarkable. No acute displaced  rib fracture visualized. IMPRESSION: 1. No acute cardiopulmonary abnormality. Electronically Signed   By: Valentino Saxon MD   On: 05/10/2020 15:21   VAS [place] LOWER EXTREMITY VENOUS (DVT) (ONLY [facility] & [facility])  Result Date: 05/10/2020  Lower Venous DVTStudy Indications: Edema.  Comparison Study: no prior Performing Technologist: Abram Sander RVS  Examination Guidelines: A complete evaluation includes B-mode imaging, spectral Doppler, color Doppler, and power Doppler as needed of all accessible portions of each vessel. Bilateral testing is considered an integral part of a complete examination. Limited examinations for reoccurring indications may be performed as noted. The reflux portion of the exam is performed with the patient in reverse Trendelenburg.  +-----+---------------+---------+-----------+----------+--------------+ RIGHTCompressibilityPhasicitySpontaneityPropertiesThrombus Aging +-----+---------------+---------+-----------+----------+--------------+ CFV  Full           Yes      Yes                                 +-----+---------------+---------+-----------+----------+--------------+   +---------+---------------+---------+-----------+----------+-----------------+ LEFT     CompressibilityPhasicitySpontaneityPropertiesThrombus Aging    +---------+---------------+---------+-----------+----------+-----------------+ CFV      Partial        No       No                   Age Indeterminate +---------+---------------+---------+-----------+----------+-----------------+ SFJ      Partial  Age Indeterminate +---------+---------------+---------+-----------+----------+-----------------+ FV Prox  Partial                                      Age Indeterminate +---------+---------------+---------+-----------+----------+-----------------+ FV Mid   Partial                                      Age Indeterminate  +---------+---------------+---------+-----------+----------+-----------------+ FV DistalPartial                                      Age Indeterminate +---------+---------------+---------+-----------+----------+-----------------+ PFV      Partial                                      Age Indeterminate +---------+---------------+---------+-----------+----------+-----------------+ POP      Partial        Yes      Yes                  Age Indeterminate +---------+---------------+---------+-----------+----------+-----------------+ PTV      None                                         Age Indeterminate +---------+---------------+---------+-----------+----------+-----------------+ PERO                                                  Not visualized    +---------+---------------+---------+-----------+----------+-----------------+ SSV      None                                         Age Indeterminate +---------+---------------+---------+-----------+----------+-----------------+ EIV      Partial        Yes      Yes                                    +---------+---------------+---------+-----------+----------+-----------------+ common iliac vein & ivc are unable to visualize due to bowel gas    Summary: LEFT: - Findings consistent with age indeterminate deep vein thrombosis involving the left common femoral vein, SF junction, left femoral vein, left proximal profunda vein, left popliteal vein, left posterior tibial veins, and left soleal veins. - No cystic structure found in the popliteal fossa.  RIGHT: - No evidence of common femoral vein obstruction.  *See table(s) above for measurements and observations. Electronically signed by Deitra Mayo MD on 05/10/2020 at 4:44:01 PM. Report was modified by Deitra Mayo MD on 05/10/2020 6:45:04 PM due to incorrect side on initial report.    Final (Updated)     Procedures Procedures (including critical care  time)  Medications Ordered in ED Medications  sodium chloride 0.9 % bolus 1,000 mL (has no administration in time range)  sodium chloride 0.9 % bolus 500 mL (0 mLs Intravenous Stopped 05/10/20 1858)    ED Course  I have reviewed the triage vital signs and the nursing notes.  Pertinent labs & imaging results that were available during my care of the patient were reviewed by me and considered in my medical decision making (see chart for details).  Vitals:   05/10/20 1500 05/10/20 1515 05/10/20 1530 05/10/20 1903  BP: 117/61 112/76 113/70 (!) 80/43  Pulse: (!) 40 (!) 43 (!) 44 (!) 40  Resp: 16 15 17 17   Temp:      TempSrc:      SpO2: 100% 100% 98% 97%  Weight:    99.8 kg  Height:    5\' 8"  (1.727 m)    Clinical Course as of May 10 2058  Tue May 10, 2020  2035 Reassessed patient. Repositioned him in bed. .Repeat pressure is 100/37. 1 liter IVF ordered  BP(!): 80/43 [KA]    Clinical Course User Index [KA] Achilles Neville, Harley Hallmark, PA-C   MDM Rules/Calculators/A&P                          History provided by family member with additional history obtained from chart review.    On exam patient appears confused.  He is only alert to himself.  This is abnormal per 3 that family provided.  Patient is usually able to drive and take care of himself.  He appears dehydrated.  Mucous membranes are dry.  He has no abdominal tenderness.  No leg length discrepancies.  He does have LLE edema.  He is able to follow simple commands.  Labs show leukocytosis of 13.7, hemoglobin consistent with baseline.  No significant electrolyte derangement.  He does have bicarb of 17 with a normal anion gap.  BUN/creatinine today is 47/2.53.  This appears better than it was compared to labs he had at cardiology office x3 weeks ago.  Ammonia is normal.  Lactic acid is within normal range.  Covid swab is in process.  EKG without ischemic changes. UA not yet collected.  Ultrasound shows age-indeterminate left lower  extremity DVT.  Case discussed with on-call vascular surgeon Dr. Doren Custard.  He viewed imaging.  He states DVT is likely chronic.  Patient is not a candidate for anticoagulation or surgery.  No interventions needed at this time. CT head and cervical spine are negative for any acute abnormalities.  No signs of stroke.  I viewed pt's chest xray and it does not suggest acute infectious processes. On reassessment patient hypotensive, will give additional IVF.  Patient care transferred to Dr. Gilford Raid at the end of my shift pending UA. Patient presentation, ED course, and plan of care discussed with review of all pertinent labs and imaging. Anticipate admission.   Portions of this note were generated with [employer]. Dictation errors may occur despite best attempts at proofreading.    Final Clinical Impression(s) / ED Diagnoses Final diagnoses:  Altered mental status, unspecified altered mental status type  Chronic deep vein thrombosis (DVT) of left lower extremity, unspecified vein [facility])    Rx / DC Orders ED Discharge Orders    None       Flint Melter 05/10/20 2059    Isla Pence, MD 05/10/20 [phone]

## 2020-05-10 NOTE — H&P (Signed)
History and Physical    Erik Morales ESP:[id number] DOB: 19-Jan-1941 DOA: 05/09/2020  PCP: Erik Johns, MD   Patient coming from: Home   Chief Complaint: Confusion, fall   HPI: Erik Morales is a 79 y.o. male with medical history significant for hypertension, type 2 diabetes mellitus, chronic kidney disease stage IV, paroxysmal atrial fibrillation not anticoagulated due to peptic ulcer disease with history of bleeding, and now presenting to the emergency department for evaluation of confusion and a fall.  Most of the history is obtained from the patient's family who reports that he has some chronic confusion but seemed to be more confused for the past few days, they suspect he had a fall 3 or 4 days ago, and notes that he lives with his wife who has some cognitive deficits and that he had recently refused family's encouragement to seek evaluation in the ED, has refused home health services in the past, and usually refuses to have family assist him at home.  Family reports that his left leg swelling seems to be chronic. The patient himself does not have any specific complaints.    ED Course: Upon arrival to the ED, patient is found to be afebrile, saturating well on room air, bradycardic in the 40s to 63F, and with systolic blood pressure dipping into the 80s.  EKG features sinus bradycardia with rate 43 and RBBB.  Chest x-ray negative for acute cardiopulmonary disease.  Noncontrast head CT and cervical spine CT negative for acute findings.  Lower extremity venous Dopplers notable for age-indeterminate left leg DVT.  Chemistry panel with bicarbonate of 17 and creatinine 2.53 which appears similar to priors.  CBC features a leukocytosis to 13,700 and a mild normocytic anemia.  Lactic acid reassuringly normal.  Ammonia is normal.  Urinalysis with ketonuria.  COVID-19 screening test is negative.  ED physician reviewed the case with vascular surgery who felt that the left leg DVT appeared chronic and that the  patient was not a candidate for anticoagulation or any procedure.  Patient was treated with IV fluids in the ED and blood and urine cultures were ordered.  Patient has been agitated in the emergency department, threatening violence, and has been requiring a Air cabin crew.  Review of Systems:  All other systems reviewed and apart from HPI, are negative.  Past Medical History:  Diagnosis Date  . Abnormal thyroid function test 09/25/2018  . Abnormality of gait 12/05/2018  . Acute blood loss anemia   . Acute on chronic renal failure ([place]) 10/13/2018  . AKI (acute kidney injury) ([place]) 09/25/2018  . CAP (community acquired pneumonia) 09/25/2018  . Chronic diastolic congestive heart failure ([place])   . Chronic kidney disease (CKD), stage III (moderate)   . Debility 10/02/2018  . Diabetes mellitus due to underlying condition with unspecified complications ([place]) 10/22/4560  . DM2 (diabetes mellitus, type 2) ([place])   . Duodenal ulcer with hemorrhage   . E-coli UTI 09/25/2018  . Elevated troponin 09/25/2018  . Hematochezia 09/25/2018  . History of GI bleed   . HLD (hyperlipidemia)   . HTN (hypertension)   . Hypoalbuminemia due to protein-calorie malnutrition ([place])   . Hypotension   . Leukocytosis   . Melena   . Nonsustained ventricular tachycardia ([place]) 09/04/2019  . PAF (paroxysmal atrial fibrillation) ([place]) 01/27/2019  . Pressure injury of skin 09/26/2018  . Severe sepsis ([place]) 09/25/2018    Past Surgical History:  Procedure Laterality Date  . ESOPHAGOGASTRODUODENOSCOPY (EGD) WITH PROPOFOL N/A 09/27/2018  Procedure: ESOPHAGOGASTRODUODENOSCOPY (EGD) WITH PROPOFOL;  Surgeon: Doran Stabler, MD;  Location: [facility];  Service: Gastroenterology;  Laterality: N/A;  . HOT HEMOSTASIS N/A 09/27/2018   Procedure: HOT HEMOSTASIS (ARGON PLASMA COAGULATION/BICAP);  Surgeon: Doran Stabler, MD;  Location: [facility];  Service: Gastroenterology;  Laterality: N/A;  . SUBMUCOSAL INJECTION  09/27/2018   Procedure:  SUBMUCOSAL INJECTION;  Surgeon: Doran Stabler, MD;  Location: [facility];  Service: Gastroenterology;;    Social History:   reports that he has quit smoking. His smoking use included cigarettes. He has never used smokeless tobacco. He reports that he does not drink alcohol and does not use drugs.  No Known Allergies  Family History  Problem Relation Age of Onset  . Diabetes Son      Prior to Admission medications   Medication Sig Start Date End Date Taking? Authorizing Provider  amiodarone (PACERONE) 200 MG tablet Take 100 mg by mouth daily.   Yes [provider]  atenolol (TENORMIN) 100 MG tablet Take 100 mg by mouth daily.   Yes [provider]  docusate sodium (COLACE) 100 MG capsule Take 1 capsule (100 mg total) by mouth 2 (two) times daily. 10/10/18  Yes Love, Ivan Anchors, PA-C  ferrous sulfate 325 (65 FE) MG tablet Take 325 mg by mouth daily with breakfast.   Yes [provider]  fluticasone (FLONASE) 50 MCG/ACT nasal spray Place 2 sprays into both nostrils daily. 11/14/18  Yes [provider]  glimepiride (AMARYL) 1 MG tablet Take 1 tablet (1 mg total) by mouth daily with breakfast. 10/11/18  Yes Love, Ivan Anchors, PA-C  icosapent Ethyl (VASCEPA) 1 g capsule Take 2 g by mouth 2 (two) times daily.   Yes [provider]  latanoprost (XALATAN) 0.005 % ophthalmic solution Place 1 drop into both eyes at bedtime. 10/10/18  Yes Love, Ivan Anchors, PA-C  lisinopril (ZESTRIL) 20 MG tablet Take 20 mg by mouth daily.  05/26/19  Yes [provider]  montelukast (SINGULAIR) 10 MG tablet Take 1 tablet (10 mg total) by mouth at bedtime as needed (allergies). 10/10/18  Yes Love, Ivan Anchors, PA-C  Multiple Vitamin (MULTIVITAMIN WITH MINERALS) TABS tablet Take 1 tablet by mouth daily. Centrum Silver   Yes [provider]  pantoprazole (PROTONIX) 20 MG tablet Take 20 mg by mouth 2 (two) times daily.    Yes [provider]  polyethylene  glycol (MIRALAX / GLYCOLAX) packet Take 17 g by mouth daily as needed for mild constipation. 10/10/18  Yes Love, Ivan Anchors, PA-C  potassium chloride (KLOR-CON) 10 MEQ tablet Take 10 mEq by mouth daily. 05/26/19  Yes [provider]  pravastatin (PRAVACHOL) 40 MG tablet Take 0.5 tablets (20 mg total) by mouth at bedtime. 04/18/20  Yes Revankar, Reita Cliche, MD  Vitamin D, Ergocalciferol, (DRISDOL) 1.25 MG (50000 UT) CAPS capsule Take 50,000 Units by mouth every 7 (seven) days.    Yes [provider]  acetaminophen (TYLENOL) 500 MG tablet Take 500 mg by mouth every 6 (six) hours as needed for headache (pain).    [provider]  albuterol (VENTOLIN HFA) 108 (90 Base) MCG/ACT inhaler Inhale 1 puff into the lungs every 6 (six) hours as needed for shortness of breath.  02/06/19   [provider]    Physical Exam: Vitals:   05/10/20 1515 05/10/20 1530 05/10/20 1903 05/10/20 2204  BP: 112/76 113/70 (!) 80/43 (!) 122/52  Pulse: (!) 43 (!) 44 (!) 40 Marland Kitchen)  41  Resp: 15 17 17 18   Temp:      TempSrc:      SpO2: 100% 98% 97% 100%  Weight:   99.8 kg   Height:   5\' 8"  (1.727 m)     Constitutional: NAD, agitated   Eyes: PERTLA, lids and conjunctivae normal ENMT: Mucous membranes are moist. Posterior pharynx clear of any exudate or lesions.   Neck: normal, supple, no masses, no thyromegaly Respiratory:  no tachypnea. No accessory muscle use.  Cardiovascular: No diaphoresis or pallor. Bilateral LE edema Lt >> Rt. Abdomen: No distension, no tenderness, soft. Bowel sounds active.  Musculoskeletal: no clubbing / cyanosis. No joint deformity upper and lower extremities.   Skin: no significant rashes, lesions, ulcers. Warm, dry, well-perfused. Neurologic: CN 2-12 grossly intact. Sensation intact. Moving all extremities.  Psychiatric: Alert and oriented to person only. Agitated.      Labs and Imaging on Admission: I have personally reviewed following labs and imaging  studies  CBC: Recent Labs  Lab 05/09/20 2208 05/10/20 1644  WBC 13.7*  --   HGB 12.3* 11.6*  HCT 38.4* 34.0*  MCV 94.8  --   PLT 267  --    Basic Metabolic Panel: Recent Labs  Lab 05/09/20 2208 05/10/20 1644  NA 140 146*  K 4.2 4.0  CL 111  --   CO2 17*  --   GLUCOSE 140*  --   BUN 47*  --   CREATININE 2.53*  --   CALCIUM 8.9  --    GFR: Estimated Creatinine Clearance: 26.2 mL/min (A) (by C-G formula based on SCr of 2.53 mg/dL (H)). Liver Function Tests: No results for input(s): AST, ALT, ALKPHOS, BILITOT, PROT, ALBUMIN in the last 168 hours. No results for input(s): LIPASE, AMYLASE in the last 168 hours. Recent Labs  Lab 05/10/20 1635  AMMONIA 19   Coagulation Profile: No results for input(s): INR, PROTIME in the last 168 hours. Cardiac Enzymes: No results for input(s): CKTOTAL, CKMB, CKMBINDEX, TROPONINI in the last 168 hours. BNP (last 3 results) No results for input(s): PROBNP in the last 8760 hours. HbA1C: No results for input(s): HGBA1C in the last 72 hours. CBG: Recent Labs  Lab 05/10/20 2255  GLUCAP 105*   Lipid Profile: No results for input(s): CHOL, HDL, LDLCALC, TRIG, CHOLHDL, LDLDIRECT in the last 72 hours. Thyroid Function Tests: No results for input(s): TSH, T4TOTAL, FREET4, T3FREE, THYROIDAB in the last 72 hours. Anemia Panel: No results for input(s): VITAMINB12, FOLATE, FERRITIN, TIBC, IRON, RETICCTPCT in the last 72 hours. Urine analysis:    Component Value Date/Time   COLORURINE YELLOW 05/10/2020 2230   APPEARANCEUR CLEAR 05/10/2020 2230   LABSPEC 1.014 05/10/2020 2230   PHURINE 5.0 05/10/2020 2230   GLUCOSEU NEGATIVE 05/10/2020 2230   HGBUR NEGATIVE 05/10/2020 2230   BILIRUBINUR NEGATIVE 05/10/2020 2230   KETONESUR 5 (A) 05/10/2020 2230   PROTEINUR NEGATIVE 05/10/2020 2230   NITRITE NEGATIVE 05/10/2020 2230   LEUKOCYTESUR NEGATIVE 05/10/2020 2230   Sepsis Labs: @LABRCNTIP (procalcitonin:4,lacticidven:4) ) Recent Results  (from the past 240 hour(s))  SARS Coronavirus 2 by RT PCR (hospital order, performed in [place] hospital lab) Nasopharyngeal Nasopharyngeal Swab     Status: None   Collection Time: 05/10/20  8:17 PM   Specimen: Nasopharyngeal Swab  Result Value Ref Range Status   SARS Coronavirus 2 NEGATIVE NEGATIVE Final    Comment: (NOTE) SARS-CoV-2 target nucleic acids are NOT DETECTED.  The SARS-CoV-2 RNA is generally detectable in upper and lower  respiratory specimens during the acute phase of infection. The lowest concentration of SARS-CoV-2 viral copies this assay can detect is 250 copies / mL. A negative result does not preclude SARS-CoV-2 infection and should not be used as the sole basis for treatment or other patient management decisions.  A negative result may occur with improper specimen collection / handling, submission of specimen other than nasopharyngeal swab, presence of viral mutation(s) within the areas targeted by this assay, and inadequate number of viral copies (<250 copies / mL). A negative result must be combined with clinical observations, patient history, and epidemiological information.  Fact Sheet for Patients:   StrictlyIdeas.no  Fact Sheet for Healthcare Providers: BankingDealers.co.za  This test is not yet approved or  cleared by the [place] FDA and has been authorized for detection and/or diagnosis of SARS-CoV-2 by FDA under an Emergency Use Authorization (EUA).  This EUA will remain in effect (meaning this test can be used) for the duration of the COVID-19 declaration under Section 564(b)(1) of the Act, 21 U.S.C. section 360bbb-3(b)(1), unless the authorization is terminated or revoked sooner.  Performed at [facility], [place] [address] [place], [place] [address]      Radiological Exams on Admission: CT Head Wo Contrast  Result Date: 05/10/2020 CLINICAL DATA:  Fall 3 days ago, altered. Difficulty  walking and increased weakness per family. Left leg swelling. EXAM: CT HEAD WITHOUT CONTRAST CT CERVICAL SPINE WITHOUT CONTRAST TECHNIQUE: Multidetector CT imaging of the head and cervical spine was performed following the standard protocol without intravenous contrast. Multiplanar CT image reconstructions of the cervical spine were also generated. COMPARISON:  None. FINDINGS: CT HEAD FINDINGS Brain: Cerebral ventricle sizes are concordant with the degree of cerebral volume loss. Patchy and confluent areas of decreased attenuation are noted throughout the deep and periventricular white matter of the cerebral hemispheres bilaterally, compatible with chronic microvascular ischemic disease. No evidence of large-territorial acute infarction. No parenchymal hemorrhage. No mass lesion. No extra-axial collection. No mass effect or midline shift. No hydrocephalus. Basilar cisterns are patent. Vascular: No hyperdense vessel or unexpected calcification. Skull: Negative for fracture or focal lesion. Sinuses/Orbits: Mucosal thickening with likely inspissated mucus within the left sphenoid sinus. Otherwise paranasal sinuses mastoid air cells are clear with improved aeration compared to prior CT. Other: None CT CERVICAL SPINE FINDINGS Alignment: Normal.  Patient's head appears to be rotated. Skull base and vertebrae: Vague cortical irregularity on sagittal view posterior to the base of dens likely represents an old healed fracture versus degenerative changes (8:27). Multilevel discontinues bulky osteophyte formation well-corticated. Multilevel facet arthropathy and uncovertebral arthropathy. No acute fracture. No primary bone lesion or focal pathologic process. Soft tissues and spinal canal: No prevertebral fluid or swelling. No visible canal hematoma. Disc levels: Multilevel intervertebral disc space narrowing mild-to-moderate. Upper chest: Negative. Other: No acute displaced fracture of the visualized ribs. IMPRESSION: 1. No  acute intracranial abnormality. 2. No acute displaced fracture or traumatic listhesis of the cervical spine. Electronically Signed   By: Iven Finn M.D.   On: 05/10/2020 18:50   CT Cervical Spine Wo Contrast  Result Date: 05/10/2020 CLINICAL DATA:  Fall 3 days ago, altered. Difficulty walking and increased weakness per family. Left leg swelling. EXAM: CT HEAD WITHOUT CONTRAST CT CERVICAL SPINE WITHOUT CONTRAST TECHNIQUE: Multidetector CT imaging of the head and cervical spine was performed following the standard protocol without intravenous contrast. Multiplanar CT image reconstructions of the cervical spine were also generated. COMPARISON:  None. FINDINGS: CT HEAD FINDINGS  Brain: Cerebral ventricle sizes are concordant with the degree of cerebral volume loss. Patchy and confluent areas of decreased attenuation are noted throughout the deep and periventricular white matter of the cerebral hemispheres bilaterally, compatible with chronic microvascular ischemic disease. No evidence of large-territorial acute infarction. No parenchymal hemorrhage. No mass lesion. No extra-axial collection. No mass effect or midline shift. No hydrocephalus. Basilar cisterns are patent. Vascular: No hyperdense vessel or unexpected calcification. Skull: Negative for fracture or focal lesion. Sinuses/Orbits: Mucosal thickening with likely inspissated mucus within the left sphenoid sinus. Otherwise paranasal sinuses mastoid air cells are clear with improved aeration compared to prior CT. Other: None CT CERVICAL SPINE FINDINGS Alignment: Normal.  Patient's head appears to be rotated. Skull base and vertebrae: Vague cortical irregularity on sagittal view posterior to the base of dens likely represents an old healed fracture versus degenerative changes (8:27). Multilevel discontinues bulky osteophyte formation well-corticated. Multilevel facet arthropathy and uncovertebral arthropathy. No acute fracture. No primary bone lesion or  focal pathologic process. Soft tissues and spinal canal: No prevertebral fluid or swelling. No visible canal hematoma. Disc levels: Multilevel intervertebral disc space narrowing mild-to-moderate. Upper chest: Negative. Other: No acute displaced fracture of the visualized ribs. IMPRESSION: 1. No acute intracranial abnormality. 2. No acute displaced fracture or traumatic listhesis of the cervical spine. Electronically Signed   By: Iven Finn M.D.   On: 05/10/2020 18:50   DG Chest Portable 1 View  Result Date: 05/10/2020 CLINICAL DATA:  Weakness fall EXAM: PORTABLE CHEST 1 VIEW COMPARISON:  October 03, 2018 FINDINGS: The cardiomediastinal silhouette is unchanged and enlarged in contour.Tortuous thoracic aorta. No pleural effusion. No pneumothorax. No acute pleuroparenchymal abnormality. Visualized abdomen is unremarkable. No acute displaced rib fracture visualized. IMPRESSION: 1. No acute cardiopulmonary abnormality. Electronically Signed   By: Valentino Saxon MD   On: 05/10/2020 15:21   VAS [place] LOWER EXTREMITY VENOUS (DVT) (ONLY [facility] & [facility])  Result Date: 05/10/2020  Lower Venous DVTStudy Indications: Edema.  Comparison Study: no prior Performing Technologist: Abram Sander RVS  Examination Guidelines: A complete evaluation includes B-mode imaging, spectral Doppler, color Doppler, and power Doppler as needed of all accessible portions of each vessel. Bilateral testing is considered an integral part of a complete examination. Limited examinations for reoccurring indications may be performed as noted. The reflux portion of the exam is performed with the patient in reverse Trendelenburg.  +-----+---------------+---------+-----------+----------+--------------+ RIGHTCompressibilityPhasicitySpontaneityPropertiesThrombus Aging +-----+---------------+---------+-----------+----------+--------------+ CFV  Full           Yes      Yes                                  +-----+---------------+---------+-----------+----------+--------------+   +---------+---------------+---------+-----------+----------+-----------------+ LEFT     CompressibilityPhasicitySpontaneityPropertiesThrombus Aging    +---------+---------------+---------+-----------+----------+-----------------+ CFV      Partial        No       No                   Age Indeterminate +---------+---------------+---------+-----------+----------+-----------------+ SFJ      Partial                                      Age Indeterminate +---------+---------------+---------+-----------+----------+-----------------+ FV Prox  Partial  Age Indeterminate +---------+---------------+---------+-----------+----------+-----------------+ FV Mid   Partial                                      Age Indeterminate +---------+---------------+---------+-----------+----------+-----------------+ FV DistalPartial                                      Age Indeterminate +---------+---------------+---------+-----------+----------+-----------------+ PFV      Partial                                      Age Indeterminate +---------+---------------+---------+-----------+----------+-----------------+ POP      Partial        Yes      Yes                  Age Indeterminate +---------+---------------+---------+-----------+----------+-----------------+ PTV      None                                         Age Indeterminate +---------+---------------+---------+-----------+----------+-----------------+ PERO                                                  Not visualized    +---------+---------------+---------+-----------+----------+-----------------+ SSV      None                                         Age Indeterminate +---------+---------------+---------+-----------+----------+-----------------+ EIV      Partial        Yes      Yes                                     +---------+---------------+---------+-----------+----------+-----------------+ common iliac vein & ivc are unable to visualize due to bowel gas    Summary: LEFT: - Findings consistent with age indeterminate deep vein thrombosis involving the left common femoral vein, SF junction, left femoral vein, left proximal profunda vein, left popliteal vein, left posterior tibial veins, and left soleal veins. - No cystic structure found in the popliteal fossa.  RIGHT: - No evidence of common femoral vein obstruction.  *See table(s) above for measurements and observations. Electronically signed by Deitra Mayo MD on 05/10/2020 at 4:44:01 PM. Report was modified by Deitra Mayo MD on 05/10/2020 6:45:04 PM due to incorrect side on initial report.    Final (Updated)     EKG: Independently reviewed. Sinus bradycardia, rate 43, RBBB.   Assessment/Plan   1. Acute encephalopathy  - Presents at urging of family who report that he has been more confused recently and fell 3 or 4 days ago  - Patient is oriented to person only in ED and agitated, attempting to hit nurse  - Head CT is negative for acute findings, ammonia level normal, and no apparent infection  - Check TSH, B12, thiamine, and RPR  - Continue IVF hydration, delirium precautions, Haldol if needed for  severe agitation    2. Left leg DVT  - No evidence for PE or phlegmasia  - ED physician discussed with vascular surgeon who felt that this is chronic and patient not candidate for [facility] or procedure    3. Sinus bradycardia  - HR in 40-50s in ED without chest pain or syncope, sinus rhythm confirmed on EKG  - Hold atenolol and amiodarone initially, continue cardiac monitoring    4. Paroxysmal atrial fibrillation  - In sinus rhythm on admission  - Not anticoagulated d/t hx of GIB  - Amiodarone held initially due to bradycardia     5. CKD IV  - SCr is 2.53 on admission, similar to priors  - Renally-dose medications, monitor      6. Hypertension  - There were some low BP readings in ED and antihypertensives held on admission    7. Type II DM  - A1c was 6.4% last month  - Check CBGs and use a low-intensity SSI for now    8. Leukocytosis  - WBC is 13,700 on admission without fever or any apparent infectious process  - Cultures are being obtained, will follow    DVT prophylaxis: sq heparin  Code Status: Full  Family Communication: Granddaughter updated by phone   Disposition Plan:  Patient is from: Home  Anticipated d/c is to: TBD Anticipated d/c date is: 05/11/20 Patient currently: Encephalopathic  Consults called: None  Admission status: Observation     Vianne Bulls, MD [facility]  05/10/2020, 11:08 PM

## 2020-05-10 NOTE — ED Notes (Signed)
This NT placed an external male catheter on pt and explained to pt what is was and what it was used for. Pt still seemed confused but partially agreed to leave it on. Clinical sitter was in room and stated she would try and help keep it on.

## 2020-05-10 NOTE — Progress Notes (Signed)
Lower extremity venous has been completed.   Preliminary results in CV Proc.  Results given to RN.   Erik Morales 05/10/2020 4:13 PM

## 2020-05-10 NOTE — ED Notes (Addendum)
Pt oriented to self and predident, but does not know where he is. Pt can follow commands but is unsure why he is here.

## 2020-05-10 NOTE — ED Notes (Signed)
Pt is becoming agitated, pulled out IV and all leads.

## 2020-05-10 NOTE — ED Notes (Signed)
Out to CT 

## 2020-05-10 NOTE — ED Notes (Signed)
Pt is oriented to person only. Pt continues to pull leads off and attempt to get out of bed

## 2020-05-10 NOTE — ED Notes (Signed)
Pt verbally and physically aggressive with this RN during in and out catheter pt stating " Stop trying to kill me, you are hurting me and I am going to slit your throat." This RN assisted by Thurnell Garbe and sitter at bedside aided pt in procedure. Collection successful.

## 2020-05-10 NOTE — ED Notes (Signed)
Opyd, MD at bedside at this time, pt placed in soft upper extremity restraints for pt safety as pt attempts to removes IV. Pt verbally and physically aggressive with staff attempting to kick this RN in face. Pt removed upper and lower dentures, dentures place in container with pt label and packed with belongings.Pt removing jewelry and throwing rings across the room, 2 yellow rings placed in container with label, packed with belongings. Pt rambling at this time.

## 2020-05-11 DIAGNOSIS — R4182 Altered mental status, unspecified: Secondary | ICD-10-CM

## 2020-05-11 DIAGNOSIS — G9340 Encephalopathy, unspecified: Secondary | ICD-10-CM | POA: Diagnosis not present

## 2020-05-11 DIAGNOSIS — R001 Bradycardia, unspecified: Secondary | ICD-10-CM | POA: Diagnosis not present

## 2020-05-11 DIAGNOSIS — I82502 Chronic embolism and thrombosis of unspecified deep veins of left lower extremity: Secondary | ICD-10-CM | POA: Diagnosis not present

## 2020-05-11 LAB — GLUCOSE, CAPILLARY
Glucose-Capillary: 90 mg/dL (ref 70–99)
Glucose-Capillary: 88 mg/dL (ref 70–99)
Glucose-Capillary: 121 mg/dL — ABNORMAL HIGH (ref 70–99)
Glucose-Capillary: 103 mg/dL — ABNORMAL HIGH (ref 70–99)

## 2020-05-11 LAB — URINE CULTURE: Culture: <10000 — AB

## 2020-05-11 MED ORDER — SODIUM CHLORIDE 0.9 % IV SOLN: INTRAVENOUS | Status: AC

## 2020-05-11 NOTE — Progress Notes (Signed)
Patient HR drop to 29 with pulse of 2.96 pt. Was asleep at the time. MD notified see epic for new order. Will continue to monitor the patient.

## 2020-05-11 NOTE — ED Notes (Signed)
Soft restraints removed at this time for pt transport to unit, pt attempting to remove leads at time of transport. Josph Macho, RN made aware per RN to RN report of pt behavior and request for pt sitter.

## 2020-05-11 NOTE — Progress Notes (Signed)
PROGRESS NOTE    Erik Morales  YOV:[id number] DOB: 1941/03/31 DOA: 05/09/2020 PCP: [name], MD    Brief Narrative:  79 y.o. male with medical history significant for hypertension, type 2 diabetes mellitus, chronic kidney disease stage IV, paroxysmal atrial fibrillation not anticoagulated due to peptic ulcer disease with history of bleeding, and now presenting to the emergency department for evaluation of confusion and a fall.  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 Acute encephalopathy  - Presents at urging of family who report that he has been more confused recently and fell 3 or 4 days ago  - Patient is awake, still appears confused  - Head CT is negative for acute findings, ammonia level normal, and no apparent infection  - Check TSH, B12, thiamine, and RPR, pending  - Will continue with IVF hydration, delirium precautions, Haldol if needed for severe agitation    2. Left leg DVT  - No evidence for PE or phlegmasia  - ED physician discussed with vascular surgeon who felt that this is chronic and patient not candidate for [facility] or procedure   -cont to monitor for now  3. Sinus bradycardia  - HR in 40-50s in ED without chest pain or syncope, sinus rhythm confirmed on EKG  - Atenolol and amiodarone initially noted prior to admit -Atenolol held. Remained bradycardic this AM with HR in the 30-40's -Held amiodarone -continue cardiac monitoring   -TSH pending  4. Paroxysmal atrial fibrillation  - In sinus rhythm on admission  - Not anticoagulated d/t hx of GIB  - Amiodarone and beta blocker now held due to bradycardia     5. CKD IV  - SCr is  2.53 on admission, similar to priors  - Renally-dose medications, monitor    6. Hypertension  - There were some low BP readings in ED and antihypertensives held on admission    7. Type II DM  - A1c was 6.4% last month  -Continue low-intensity SSI for now    8. Leukocytosis  - WBC is 13,700 on admission without fever or any apparent infectious process  - Blood cx pending   9. Dehydration -Mucus membranes are dry on exam -Did not eat breakfast this AM -Staff reports decreased urine output -Will continue patient on basal IVF at 75cc/hr  DVT prophylaxis: Heparin subq Code Status: Full Family Communication: Pt in room, family not at bedside  Status is: Observation  The patient will require care spanning > 2 midnights and should be moved to inpatient because: Unsafe d/c plan, IV treatments appropriate due to intensity of illness or inability to take PO and Inpatient level of care appropriate due to severity of illness  Dispo: The patient is from: Home              Anticipated d/c is to: SNF              Anticipated d/c date is: > 3 days              Patient currently is not medically stable to d/c.       Consultants:     Procedures:     Antimicrobials: Anti-infectives (From admission,  onward)   None       Subjective: Confused this AM, lay in bed with eyes closed as if asleep, but answered yes/no questions quickly. Otherwise remained non-vocal  Objective: Vitals:   05/11/20 0150 05/11/20 0300 05/11/20 0432 05/11/20 0925  BP: 134/66 (!) 135/57 (!) 147/58 111/87  Pulse:  (!) 42 (!) 58 (!) 43  Resp:  18 18 18   Temp:   99.1 F (37.3 C) 98.7 F (37.1 C)  TempSrc:   Oral   SpO2:  99% 100% 95%  Weight:      Height:        Intake/Output Summary (Last 24 hours) at 05/11/2020 1518 Last data filed at 05/11/2020 0500 Gross per 24 hour  Intake 1000 ml  Output --  Net 1000 ml   Filed Weights   05/10/20 1903  Weight: 99.8 kg    Examination:  General  exam: Appears calm and comfortable  Respiratory system: Clear to auscultation. Respiratory effort normal. Cardiovascular system: S1 & S2 heard, Regular Gastrointestinal system: Abdomen is nondistended, soft and nontender. No organomegaly or masses felt. Normal bowel sounds heard. Central nervous system: Nonvocal. No focal neurological deficits. Extremities: Symmetric 5 x 5 power. Skin: No rashes, lesions  Psychiatry: Unable to assess given current mentation  Data Reviewed: I have personally reviewed following labs and imaging studies  CBC: Recent Labs  Lab 05/09/20 2208 05/10/20 1644  WBC 13.7*  --   HGB 12.3* 11.6*  HCT 38.4* 34.0*  MCV 94.8  --   PLT 267  --    Basic Metabolic Panel: Recent Labs  Lab 05/09/20 2208 05/10/20 1644  NA 140 146*  K 4.2 4.0  CL 111  --   CO2 17*  --   GLUCOSE 140*  --   BUN 47*  --   CREATININE 2.53*  --   CALCIUM 8.9  --    GFR: Estimated Creatinine Clearance: 26.2 mL/min (A) (by C-G formula based on SCr of 2.53 mg/dL (H)). Liver Function Tests: No results for input(s): AST, ALT, ALKPHOS, BILITOT, PROT, ALBUMIN in the last 168 hours. No results for input(s): LIPASE, AMYLASE in the last 168 hours. Recent Labs  Lab 05/10/20 1635  AMMONIA 19   Coagulation Profile: No results for input(s): INR, PROTIME in the last 168 hours. Cardiac Enzymes: No results for input(s): CKTOTAL, CKMB, CKMBINDEX, TROPONINI in the last 168 hours. BNP (last 3 results) No results for input(s): PROBNP in the last 8760 hours. HbA1C: No results for input(s): HGBA1C in the last 72 hours. CBG: Recent Labs  Lab 05/10/20 2255 05/11/20 0806 05/11/20 1144  GLUCAP 105* 90 88   Lipid Profile: No results for input(s): CHOL, HDL, LDLCALC, TRIG, CHOLHDL, LDLDIRECT in the last 72 hours. Thyroid Function Tests: No results for input(s): TSH, T4TOTAL, FREET4, T3FREE, THYROIDAB in the last 72 hours. Anemia Panel: No results for input(s): VITAMINB12, FOLATE, FERRITIN,  TIBC, IRON, RETICCTPCT in the last 72 hours. Sepsis Labs: Recent Labs  Lab 05/09/20 2208  LATICACIDVEN 0.9    Recent Results (from the past 240 hour(s))  SARS Coronavirus 2 by RT PCR (hospital order, performed in [facility] hospital lab) Nasopharyngeal Nasopharyngeal Swab     Status: None   Collection Time: 05/10/20  8:17 PM   Specimen: Nasopharyngeal Swab  Result Value Ref Range Status   SARS Coronavirus 2 NEGATIVE NEGATIVE Final    Comment: (NOTE) SARS-CoV-2 target nucleic acids are NOT DETECTED.  The SARS-CoV-2 RNA is generally detectable in upper and lower  respiratory specimens during the acute phase of infection. The lowest concentration of SARS-CoV-2 viral copies this assay can detect is 250 copies / mL. A negative result does not preclude SARS-CoV-2 infection and should not be used as the sole basis for treatment or other patient management decisions.  A negative result may occur with improper specimen collection / handling, submission of specimen other than nasopharyngeal swab, presence of viral mutation(s) within the areas targeted by this assay, and inadequate number of viral copies (<250 copies / mL). A negative result must be combined with clinical observations, patient history, and epidemiological information.  Fact Sheet for Patients:   StrictlyIdeas.no  Fact Sheet for Healthcare Providers: BankingDealers.co.za  This test is not yet approved or  cleared by the [place] FDA and has been authorized for detection and/or diagnosis of SARS-CoV-2 by FDA under an Emergency Use Authorization (EUA).  This EUA will remain in effect (meaning this test can be used) for the duration of the COVID-19 declaration under Section 564(b)(1) of the Act, 21 U.S.C. section 360bbb-3(b)(1), unless the authorization is terminated or revoked sooner.  Performed at [facility], [place] [address], [place], [place] [address]       Radiology Studies: CT Head Wo Contrast  Result Date: 05/10/2020 CLINICAL DATA:  Fall 3 days ago, altered. Difficulty walking and increased weakness per family. Left leg swelling. EXAM: CT HEAD WITHOUT CONTRAST CT CERVICAL SPINE WITHOUT CONTRAST TECHNIQUE: Multidetector CT imaging of the head and cervical spine was performed following the standard protocol without intravenous contrast. Multiplanar CT image reconstructions of the cervical spine were also generated. COMPARISON:  None. FINDINGS: CT HEAD FINDINGS Brain: Cerebral ventricle sizes are concordant with the degree of cerebral volume loss. Patchy and confluent areas of decreased attenuation are noted throughout the deep and periventricular white matter of the cerebral hemispheres bilaterally, compatible with chronic microvascular ischemic disease. No evidence of large-territorial acute infarction. No parenchymal hemorrhage. No mass lesion. No extra-axial collection. No mass effect or midline shift. No hydrocephalus. Basilar cisterns are patent. Vascular: No hyperdense vessel or unexpected calcification. Skull: Negative for fracture or focal lesion. Sinuses/Orbits: Mucosal thickening with likely inspissated mucus within the left sphenoid sinus. Otherwise paranasal sinuses mastoid air cells are clear with improved aeration compared to prior CT. Other: None CT CERVICAL SPINE FINDINGS Alignment: Normal.  Patient's head appears to be rotated. Skull base and vertebrae: Vague cortical irregularity on sagittal view posterior to the base of dens likely represents an old healed fracture versus degenerative changes (8:27). Multilevel discontinues bulky osteophyte formation well-corticated. Multilevel facet arthropathy and uncovertebral arthropathy. No acute fracture. No primary bone lesion or focal pathologic process. Soft tissues and spinal canal: No prevertebral fluid or swelling. No visible canal hematoma. Disc levels: Multilevel intervertebral disc space  narrowing mild-to-moderate. Upper chest: Negative. Other: No acute displaced fracture of the visualized ribs. IMPRESSION: 1. No acute intracranial abnormality. 2. No acute displaced fracture or traumatic listhesis of the cervical spine. Electronically Signed   By: Iven Finn M.D.   On: 05/10/2020 18:50   CT Cervical Spine Wo Contrast  Result Date: 05/10/2020 CLINICAL DATA:  Fall 3 days ago, altered. Difficulty walking and increased weakness per family. Left leg swelling. EXAM: CT HEAD WITHOUT CONTRAST CT CERVICAL SPINE WITHOUT CONTRAST TECHNIQUE: Multidetector CT imaging of the head and cervical spine was performed following the standard protocol without intravenous contrast. Multiplanar CT image reconstructions of the cervical spine were also generated. COMPARISON:  None. FINDINGS: CT HEAD FINDINGS Brain: Cerebral  ventricle sizes are concordant with the degree of cerebral volume loss. Patchy and confluent areas of decreased attenuation are noted throughout the deep and periventricular white matter of the cerebral hemispheres bilaterally, compatible with chronic microvascular ischemic disease. No evidence of large-territorial acute infarction. No parenchymal hemorrhage. No mass lesion. No extra-axial collection. No mass effect or midline shift. No hydrocephalus. Basilar cisterns are patent. Vascular: No hyperdense vessel or unexpected calcification. Skull: Negative for fracture or focal lesion. Sinuses/Orbits: Mucosal thickening with likely inspissated mucus within the left sphenoid sinus. Otherwise paranasal sinuses mastoid air cells are clear with improved aeration compared to prior CT. Other: None CT CERVICAL SPINE FINDINGS Alignment: Normal.  Patient's head appears to be rotated. Skull base and vertebrae: Vague cortical irregularity on sagittal view posterior to the base of dens likely represents an old healed fracture versus degenerative changes (8:27). Multilevel discontinues bulky osteophyte  formation well-corticated. Multilevel facet arthropathy and uncovertebral arthropathy. No acute fracture. No primary bone lesion or focal pathologic process. Soft tissues and spinal canal: No prevertebral fluid or swelling. No visible canal hematoma. Disc levels: Multilevel intervertebral disc space narrowing mild-to-moderate. Upper chest: Negative. Other: No acute displaced fracture of the visualized ribs. IMPRESSION: 1. No acute intracranial abnormality. 2. No acute displaced fracture or traumatic listhesis of the cervical spine. Electronically Signed   By: Iven Finn M.D.   On: 05/10/2020 18:50   DG Chest Portable 1 View  Result Date: 05/10/2020 CLINICAL DATA:  Weakness fall EXAM: PORTABLE CHEST 1 VIEW COMPARISON:  October 03, 2018 FINDINGS: The cardiomediastinal silhouette is unchanged and enlarged in contour.Tortuous thoracic aorta. No pleural effusion. No pneumothorax. No acute pleuroparenchymal abnormality. Visualized abdomen is unremarkable. No acute displaced rib fracture visualized. IMPRESSION: 1. No acute cardiopulmonary abnormality. Electronically Signed   By: Valentino Saxon MD   On: 05/10/2020 15:21   VAS [place] LOWER EXTREMITY VENOUS (DVT) (ONLY [facility] & [facility])  Result Date: 05/10/2020  Lower Venous DVTStudy Indications: Edema.  Comparison Study: no prior Performing Technologist: Abram Sander RVS  Examination Guidelines: A complete evaluation includes B-mode imaging, spectral Doppler, color Doppler, and power Doppler as needed of all accessible portions of each vessel. Bilateral testing is considered an integral part of a complete examination. Limited examinations for reoccurring indications may be performed as noted. The reflux portion of the exam is performed with the patient in reverse Trendelenburg.  +-----+---------------+---------+-----------+----------+--------------+ RIGHTCompressibilityPhasicitySpontaneityPropertiesThrombus Aging  +-----+---------------+---------+-----------+----------+--------------+ CFV  Full           Yes      Yes                                 +-----+---------------+---------+-----------+----------+--------------+   +---------+---------------+---------+-----------+----------+-----------------+ LEFT     CompressibilityPhasicitySpontaneityPropertiesThrombus Aging    +---------+---------------+---------+-----------+----------+-----------------+ CFV      Partial        No       No                   Age Indeterminate +---------+---------------+---------+-----------+----------+-----------------+ SFJ      Partial                                      Age Indeterminate +---------+---------------+---------+-----------+----------+-----------------+ FV Prox  Partial  Age Indeterminate +---------+---------------+---------+-----------+----------+-----------------+ FV Mid   Partial                                      Age Indeterminate +---------+---------------+---------+-----------+----------+-----------------+ FV DistalPartial                                      Age Indeterminate +---------+---------------+---------+-----------+----------+-----------------+ PFV      Partial                                      Age Indeterminate +---------+---------------+---------+-----------+----------+-----------------+ POP      Partial        Yes      Yes                  Age Indeterminate +---------+---------------+---------+-----------+----------+-----------------+ PTV      None                                         Age Indeterminate +---------+---------------+---------+-----------+----------+-----------------+ PERO                                                  Not visualized    +---------+---------------+---------+-----------+----------+-----------------+ SSV      None                                         Age  Indeterminate +---------+---------------+---------+-----------+----------+-----------------+ EIV      Partial        Yes      Yes                                    +---------+---------------+---------+-----------+----------+-----------------+ common iliac vein & ivc are unable to visualize due to bowel gas    Summary: LEFT: - Findings consistent with age indeterminate deep vein thrombosis involving the left common femoral vein, SF junction, left femoral vein, left proximal profunda vein, left popliteal vein, left posterior tibial veins, and left soleal veins. - No cystic structure found in the popliteal fossa.  RIGHT: - No evidence of common femoral vein obstruction.  *See table(s) above for measurements and observations. Electronically signed by Deitra Mayo MD on 05/10/2020 at 4:44:01 PM. Report was modified by Deitra Mayo MD on 05/10/2020 6:45:04 PM due to incorrect side on initial report.    Final (Updated)     Scheduled Meds: . heparin  5,000 Units Subcutaneous Q8H  . insulin aspart  0-9 Units Subcutaneous TID WC  . latanoprost  1 drop Both Eyes QHS  . montelukast  10 mg Oral QHS  . pantoprazole  20 mg Oral BID  . pravastatin  10 mg Oral q1800  . sodium chloride flush  3 mL Intravenous Q12H  . sodium chloride flush  3 mL Intravenous Q12H   Continuous Infusions: . sodium chloride    . sodium chloride 75 mL/hr at 05/11/20 1120  LOS: 0 days   Marylu Lund, MD [facility] Pager On Amion  If 7PM-7AM, please contact night-coverage 05/11/2020, 3:18 PM

## 2020-05-11 NOTE — ED Notes (Addendum)
This RN attempted to collect blood culture with no success, pt moving upper extremity around stating " get off me don't touch me you are hurting me."

## 2020-05-11 NOTE — Social Work (Signed)
CSW went in to try to assess pt. Pt was talkative but would only say that he didn't know why he was here and that he wanted to go home. CSW tried to talk with pt about PT but pt stated PT had not been in the room. CSW saw notes of a PT evaluation and sitter in the room said pt wouldn't participate in PT. CSW tried to reach out to daughter Tamela Oddi, had to leave a vm.

## 2020-05-11 NOTE — ED Notes (Signed)
Jontavious Commons granddaughter [phone]

## 2020-05-11 NOTE — Evaluation (Signed)
Physical Therapy Evaluation Patient Details Name: Erik Morales MRN: [id number] DOB: 1941-05-03 Today's Date: 05/11/2020   History of Present Illness   79 y.o. male with medical history significant for hypertension, type 2 diabetes mellitus, chronic kidney disease stage IV, paroxysmal atrial fibrillation not anticoagulated due to peptic ulcer disease with history of bleeding, and now presenting to the emergency department for evaluation of confusion and a fall. Pt bradycardic in ED with systolic BP in 40J, chronic L LE DVT. Pt with AMS, threatening vilolence. Admitted 05/10/20 for observation of acute encephalopathy, sinus bradycardia and paroxysmal A-fib.  Clinical Impression  Pt supine in bed with eyes closed on entry. Pt with short bouts of limited participation in Evaluation. Pt responds to name however will not state whole name. States he is dead and in a white box, does not know what season it is. Pt moves UE and R LE to one step commands, but becomes agitated when therapist attempted AAROM of L LE. HR throughout session 43-65 bpm, BP 111/87. Given level of participation and history of fall prior to admission PT currently recommending SNF level rehab at discharge. PT will continue to follow acutely.     Follow Up Recommendations SNF    Equipment Recommendations  Other (comment) (TBD at next venue)    Recommendations for Other Services OT consult     Precautions / Restrictions Precautions Precautions: Fall Restrictions Weight Bearing Restrictions: No      Mobility  Bed Mobility               General bed mobility comments: pt refuses             Pertinent Vitals/Pain Pain Assessment: Faces Faces Pain Scale: Hurts a little bit Pain Location: all over Pain Descriptors / Indicators: Grimacing;Guarding Pain Intervention(s): Monitored during session    Home Living Family/patient expects to be discharged to:: Private residence Living Arrangements: Spouse/significant  other Available Help at Discharge: Family Type of Home: Mobile home Home Access: Stairs to enter   Technical brewer of Steps: 2 Home Layout: One level   Additional Comments: pt provided information above which agrees with historical information in system    Prior Function    Unknown             Hand Dominance   Dominant Hand: Right    Extremity/Trunk Assessment   Upper Extremity Assessment Upper Extremity Assessment: RUE deficits/detail;LUE deficits/detail RUE Deficits / Details: hand and elbow ROM WFL, does not attempt shoulder motion, strength grossly assessed at 3+/5 in hand and elbow LUE Deficits / Details: hand and elbow ROM WFL, does not attempt shoulder motion, strength grossly assessed at 3+/5 in hand and elbow    Lower Extremity Assessment Lower Extremity Assessment: RLE deficits/detail;LLE deficits/detail RLE Deficits / Details: hip, knee and ankle ROM WFL, strength grossly assessed 3+/5 LLE Deficits / Details: +1 pitting edema, refuses AAROM        Communication      Cognition Arousal/Alertness: Lethargic Behavior During Therapy: Agitated;Flat affect;Impulsive;Restless Overall Cognitive Status: Impaired/Different from baseline Area of Impairment: Attention;Orientation;Following commands;Problem solving;Safety/judgement;Awareness;Memory                 Orientation Level: Disoriented to;Place;Time;Situation Current Attention Level: Selective Memory: Decreased short-term memory Following Commands: Follows one step commands inconsistently Safety/Judgement: Decreased awareness of deficits;Decreased awareness of safety Awareness: Intellectual Problem Solving: Decreased initiation;Requires verbal cues;Requires tactile cues;Slow processing General Comments: pt refuses to open eyes during session, able to follow one step commands about movement of  extremities      General Comments General comments (skin integrity, edema, etc.): HR 46-65  throughout session, BP 111/87, SaO2 95%O2 on RA        Assessment/Plan    PT Assessment Patient needs continued PT services  PT Problem List Decreased strength;Decreased range of motion;Decreased activity tolerance;Decreased balance;Decreased mobility;Decreased cognition;Cardiopulmonary status limiting activity;Decreased safety awareness;Pain       PT Treatment Interventions DME instruction;Gait training;Stair training;Functional mobility training;Therapeutic activities;Therapeutic exercise;Balance training;Cognitive remediation;Patient/family education    PT Goals (Current goals can be found in the Care Plan section)  Acute Rehab PT Goals Patient Stated Goal: go home PT Goal Formulation: Patient unable to participate in goal setting Time For Goal Achievement: 05/25/20 Potential to Achieve Goals: Fair    Frequency Min 2X/week    AM-PAC PT "6 Clicks" Mobility  Outcome Measure Help needed turning from your back to your side while in a flat bed without using bedrails?: A Little Help needed moving from lying on your back to sitting on the side of a flat bed without using bedrails?: Total Help needed moving to and from a bed to a chair (including a wheelchair)?: Total Help needed standing up from a chair using your arms (e.g., wheelchair or bedside chair)?: Total Help needed to walk in hospital room?: Total Help needed climbing 3-5 steps with a railing? : Total 6 Click Score: 8    End of Session   Activity Tolerance: Treatment limited secondary to agitation Patient left: in bed;with call bell/phone within reach;with bed alarm set;with nursing/sitter in room Nurse Communication: Mobility status PT Visit Diagnosis: Muscle weakness (generalized) (M62.81);Pain;History of falling (Z91.81);Difficulty in walking, not elsewhere classified (R26.2) Pain - Right/Left: Left Pain - part of body: Leg (generalized)    Time: 3710-6269 PT Time Calculation (min) (ACUTE ONLY): 20  min   Charges:   PT Evaluation $PT Eval Moderate Complexity: 1 Mod          Hiroshi Krummel B. Migdalia Dk PT, DPT Acute Rehabilitation Services Pager [phone] Office [phone]   [place] 05/11/2020, 9:40 AM

## 2020-05-12 DIAGNOSIS — M2550 Pain in unspecified joint: Secondary | ICD-10-CM | POA: Diagnosis not present

## 2020-05-12 DIAGNOSIS — R531 Weakness: Secondary | ICD-10-CM | POA: Diagnosis not present

## 2020-05-12 DIAGNOSIS — Z79899 Other long term (current) drug therapy: Secondary | ICD-10-CM | POA: Diagnosis not present

## 2020-05-12 DIAGNOSIS — I10 Essential (primary) hypertension: Secondary | ICD-10-CM | POA: Diagnosis not present

## 2020-05-12 DIAGNOSIS — Z87891 Personal history of nicotine dependence: Secondary | ICD-10-CM | POA: Diagnosis not present

## 2020-05-12 DIAGNOSIS — R4182 Altered mental status, unspecified: Secondary | ICD-10-CM | POA: Diagnosis not present

## 2020-05-12 DIAGNOSIS — N136 Pyonephrosis: Secondary | ICD-10-CM | POA: Diagnosis present

## 2020-05-12 DIAGNOSIS — E860 Dehydration: Secondary | ICD-10-CM | POA: Diagnosis present

## 2020-05-12 DIAGNOSIS — Y92239 Unspecified place in hospital as the place of occurrence of the external cause: Secondary | ICD-10-CM | POA: Diagnosis present

## 2020-05-12 DIAGNOSIS — M6281 Muscle weakness (generalized): Secondary | ICD-10-CM | POA: Diagnosis not present

## 2020-05-12 DIAGNOSIS — R319 Hematuria, unspecified: Secondary | ICD-10-CM | POA: Diagnosis not present

## 2020-05-12 DIAGNOSIS — N390 Urinary tract infection, site not specified: Secondary | ICD-10-CM | POA: Diagnosis not present

## 2020-05-12 DIAGNOSIS — I4891 Unspecified atrial fibrillation: Secondary | ICD-10-CM | POA: Diagnosis not present

## 2020-05-12 DIAGNOSIS — Z743 Need for continuous supervision: Secondary | ICD-10-CM | POA: Diagnosis not present

## 2020-05-12 DIAGNOSIS — N184 Chronic kidney disease, stage 4 (severe): Secondary | ICD-10-CM | POA: Diagnosis not present

## 2020-05-12 DIAGNOSIS — I130 Hypertensive heart and chronic kidney disease with heart failure and stage 1 through stage 4 chronic kidney disease, or unspecified chronic kidney disease: Secondary | ICD-10-CM | POA: Diagnosis present

## 2020-05-12 DIAGNOSIS — N179 Acute kidney failure, unspecified: Secondary | ICD-10-CM | POA: Diagnosis not present

## 2020-05-12 DIAGNOSIS — N400 Enlarged prostate without lower urinary tract symptoms: Secondary | ICD-10-CM | POA: Diagnosis present

## 2020-05-12 DIAGNOSIS — E559 Vitamin D deficiency, unspecified: Secondary | ICD-10-CM | POA: Diagnosis not present

## 2020-05-12 DIAGNOSIS — I82502 Chronic embolism and thrombosis of unspecified deep veins of left lower extremity: Secondary | ICD-10-CM | POA: Diagnosis not present

## 2020-05-12 DIAGNOSIS — E876 Hypokalemia: Secondary | ICD-10-CM | POA: Diagnosis present

## 2020-05-12 DIAGNOSIS — R001 Bradycardia, unspecified: Secondary | ICD-10-CM | POA: Diagnosis not present

## 2020-05-12 DIAGNOSIS — G9340 Encephalopathy, unspecified: Secondary | ICD-10-CM | POA: Diagnosis not present

## 2020-05-12 DIAGNOSIS — E1122 Type 2 diabetes mellitus with diabetic chronic kidney disease: Secondary | ICD-10-CM | POA: Diagnosis present

## 2020-05-12 DIAGNOSIS — G928 Other toxic encephalopathy: Secondary | ICD-10-CM | POA: Diagnosis present

## 2020-05-12 DIAGNOSIS — Z20822 Contact with and (suspected) exposure to covid-19: Secondary | ICD-10-CM | POA: Diagnosis present

## 2020-05-12 DIAGNOSIS — Y738 Miscellaneous gastroenterology and urology devices associated with adverse incidents, not elsewhere classified: Secondary | ICD-10-CM | POA: Diagnosis present

## 2020-05-12 DIAGNOSIS — Z8711 Personal history of peptic ulcer disease: Secondary | ICD-10-CM | POA: Diagnosis not present

## 2020-05-12 DIAGNOSIS — T839XXA Unspecified complication of genitourinary prosthetic device, implant and graft, initial encounter: Secondary | ICD-10-CM | POA: Diagnosis not present

## 2020-05-12 DIAGNOSIS — N1330 Unspecified hydronephrosis: Secondary | ICD-10-CM | POA: Diagnosis not present

## 2020-05-12 DIAGNOSIS — Z833 Family history of diabetes mellitus: Secondary | ICD-10-CM | POA: Diagnosis not present

## 2020-05-12 DIAGNOSIS — I5032 Chronic diastolic (congestive) heart failure: Secondary | ICD-10-CM | POA: Diagnosis present

## 2020-05-12 DIAGNOSIS — I480 Paroxysmal atrial fibrillation: Secondary | ICD-10-CM | POA: Diagnosis not present

## 2020-05-12 DIAGNOSIS — E785 Hyperlipidemia, unspecified: Secondary | ICD-10-CM | POA: Diagnosis not present

## 2020-05-12 DIAGNOSIS — Z7401 Bed confinement status: Secondary | ICD-10-CM | POA: Diagnosis not present

## 2020-05-12 DIAGNOSIS — E088 Diabetes mellitus due to underlying condition with unspecified complications: Secondary | ICD-10-CM | POA: Diagnosis not present

## 2020-05-12 LAB — BASIC METABOLIC PANEL
Anion gap: 8 (ref 5–15)
BUN: 30 mg/dL — ABNORMAL HIGH (ref 8–23)
CO2: 21 mmol/L — ABNORMAL LOW (ref 22–32)
Calcium: 8.2 mg/dL — ABNORMAL LOW (ref 8.9–10.3)
Chloride: 114 mmol/L — ABNORMAL HIGH (ref 98–111)
Creatinine, Ser: 1.84 mg/dL — ABNORMAL HIGH (ref 0.61–1.24)
GFR calc Af Amer: 39 mL/min — ABNORMAL LOW (ref >60)
GFR calc non Af Amer: 34 mL/min — ABNORMAL LOW (ref >60)
Glucose, Bld: 152 mg/dL — ABNORMAL HIGH (ref 70–99)
Potassium: 3.6 mmol/L (ref 3.5–5.1)
Sodium: 143 mmol/L (ref 135–145)

## 2020-05-12 LAB — CBC
HCT: 32.9 % — ABNORMAL LOW (ref 39.0–52.0)
Hemoglobin: 10.5 g/dL — ABNORMAL LOW (ref 13.0–17.0)
MCH: 29.8 pg (ref 26.0–34.0)
MCHC: 31.9 g/dL (ref 30.0–36.0)
MCV: 93.5 fL (ref 80.0–100.0)
Platelets: 219 10*3/uL (ref 150–400)
RBC: 3.52 MIL/uL — ABNORMAL LOW (ref 4.22–5.81)
RDW: 13.8 % (ref 11.5–15.5)
WBC: 12.1 10*3/uL — ABNORMAL HIGH (ref 4.0–10.5)
nRBC: 0 % (ref 0.0–0.2)

## 2020-05-12 LAB — GLUCOSE, CAPILLARY
Glucose-Capillary: 79 mg/dL (ref 70–99)
Glucose-Capillary: 101 mg/dL — ABNORMAL HIGH (ref 70–99)
Glucose-Capillary: 116 mg/dL — ABNORMAL HIGH (ref 70–99)
Glucose-Capillary: 131 mg/dL — ABNORMAL HIGH (ref 70–99)

## 2020-05-12 LAB — MAGNESIUM: Magnesium: 2.1 mg/dL (ref 1.7–2.4)

## 2020-05-12 NOTE — NC FL2 (Signed)
[place] LEVEL OF CARE SCREENING TOOL     IDENTIFICATION  Patient Name: Erik Morales Birthdate: Dec 28, 1940 Sex: male Admission Date (Current Location): 05/09/2020  [facility] and [place] Number:  [employer] and Address:  [facility] [place], [place] [address], [place], [place] [address]      Provider Number: [id number]  Attending Physician Name and Address:  Donne Hazel, MD  Relative Name and Phone Number:  Janett Billow [phone]    Current Level of Care: Hospital Recommended Level of Care: [place] Prior Approval Number:    Date Approved/Denied:   PASRR Number: [id number]  Discharge Plan: SNF    Current Diagnoses: Patient Active Problem List   Diagnosis Date Noted  . Altered mental status 05/12/2020  . Acute encephalopathy 05/10/2020  . Chronic deep vein thrombosis (DVT) of left lower extremity ([place]) 05/10/2020  . Sinus bradycardia 05/10/2020  . Nonsustained ventricular tachycardia ([place]) 09/04/2019  . PAF (paroxysmal atrial fibrillation) ([place]) 01/27/2019  . Diabetes mellitus due to underlying condition with unspecified complications ([place]) 75/05/2584  . Abnormality of gait 12/05/2018  . Acute on chronic renal failure ([place]) 10/13/2018  . Hypotension   . CKD (chronic kidney disease), stage IV ([place])   . Hypoalbuminemia due to protein-calorie malnutrition ([place])   . History of GI bleed   . Chronic diastolic congestive heart failure ([place])   . Debility 10/02/2018  . Leukocytosis   . Duodenal ulcer with hemorrhage   . Melena   . Acute blood loss anemia   . Pressure injury of skin 09/26/2018  . Hematochezia 09/25/2018  . AKI (acute kidney injury) ([place]) 09/25/2018  . Severe sepsis ([place]) 09/25/2018  . Elevated troponin 09/25/2018  . Abnormal thyroid function test 09/25/2018  . HTN (hypertension) 09/25/2018  . E-coli UTI 09/25/2018  . CAP (community acquired pneumonia) 09/25/2018    Orientation RESPIRATION BLADDER  Height & Weight     Self, Time, Place  Normal External catheter, Incontinent Weight: 216 lb 7.9 oz (98.2 kg) Height:  5\' 8"  (172.7 cm)  BEHAVIORAL SYMPTOMS/MOOD NEUROLOGICAL BOWEL NUTRITION STATUS      Continent Diet (see dc summary)  AMBULATORY STATUS COMMUNICATION OF NEEDS Skin   Extensive Assist Verbally Other (Comment) (Pressure Injury left buttock stage 2)                       Personal Care Assistance Level of Assistance  Bathing, Feeding, Dressing Bathing Assistance: Limited assistance Feeding assistance: Independent Dressing Assistance: Limited assistance     Functional Limitations Info  Sight, Hearing, Speech Sight Info: Adequate Hearing Info: Adequate Speech Info: Adequate    SPECIAL CARE FACTORS FREQUENCY  PT (By licensed PT), OT (By licensed OT)     PT Frequency: 5x week OT Frequency: 5x week            Contractures Contractures Info: Not present    Additional Factors Info  Code Status, Allergies, Insulin Sliding Scale Code Status Info: full Allergies Info: nka   Insulin Sliding Scale Info: insulin aspart (novoLOG) injection 0-9 Units 3x daily with meals,       Current Medications (05/12/2020):  This is the current hospital active medication list Current Facility-Administered Medications  Medication Dose Route Frequency Provider Last Rate Last Admin  . 0.9 %  sodium chloride infusion  250 mL Intravenous PRN Opyd, Ilene Qua, MD      . 0.9 %  sodium chloride infusion   Intravenous Continuous Wyline Copas,  Orpah Melter, MD 75 mL/hr at 05/12/20 1426 New Bag at 05/12/20 1426  . acetaminophen (TYLENOL) tablet 650 mg  650 mg Oral Q6H PRN Opyd, Ilene Qua, MD       Or  . acetaminophen (TYLENOL) suppository 650 mg  650 mg Rectal Q6H PRN Opyd, Ilene Qua, MD      . albuterol (VENTOLIN HFA) 108 (90 Base) MCG/ACT inhaler 1 puff  1 puff Inhalation Q6H PRN Opyd, Ilene Qua, MD      . haloperidol lactate (HALDOL) injection 2 mg  2 mg Intravenous Q6H PRN Opyd, Ilene Qua, MD       . heparin injection 5,000 Units  5,000 Units Subcutaneous Q8H Opyd, Ilene Qua, MD   5,000 Units at 05/12/20 1446  . insulin aspart (novoLOG) injection 0-9 Units  0-9 Units Subcutaneous TID WC Opyd, Timothy S, MD      . latanoprost (XALATAN) 0.005 % ophthalmic solution 1 drop  1 drop Both Eyes QHS Opyd, Ilene Qua, MD   1 drop at 05/11/20 2206  . montelukast (SINGULAIR) tablet 10 mg  10 mg Oral QHS Opyd, Ilene Qua, MD   10 mg at 05/11/20 2206  . ondansetron (ZOFRAN) tablet 4 mg  4 mg Oral Q6H PRN Opyd, Ilene Qua, MD       Or  . ondansetron (ZOFRAN) injection 4 mg  4 mg Intravenous Q6H PRN Opyd, Ilene Qua, MD      . pantoprazole (PROTONIX) EC tablet 20 mg  20 mg Oral BID Opyd, Ilene Qua, MD   20 mg at 05/12/20 1026  . polyethylene glycol (MIRALAX / GLYCOLAX) packet 17 g  17 g Oral Daily PRN Opyd, Ilene Qua, MD      . pravastatin (PRAVACHOL) tablet 10 mg  10 mg Oral q1800 Opyd, Ilene Qua, MD   10 mg at 05/11/20 1750  . sodium chloride flush (NS) 0.9 % injection 3 mL  3 mL Intravenous Q12H Opyd, Ilene Qua, MD   3 mL at 05/10/20 2351  . sodium chloride flush (NS) 0.9 % injection 3 mL  3 mL Intravenous Q12H Opyd, Ilene Qua, MD   3 mL at 05/12/20 1026  . sodium chloride flush (NS) 0.9 % injection 3 mL  3 mL Intravenous PRN Opyd, Ilene Qua, MD         Discharge Medications: Please see discharge summary for a list of discharge medications.  Relevant Imaging Results:  Relevant Lab Results:   Additional Information    Zyaira Vejar B [place], LCSWA

## 2020-05-12 NOTE — Evaluation (Signed)
Occupational Therapy Evaluation Patient Details Name: Erik Morales MRN: [id number] DOB: May 13, 1941 Today's Date: 05/12/2020    History of Present Illness  79 y.o. male with medical history significant for hypertension, type 2 diabetes mellitus, chronic kidney disease stage IV, paroxysmal atrial fibrillation not anticoagulated due to peptic ulcer disease with history of bleeding, and now presenting to the emergency department for evaluation of confusion and a fall. Pt bradycardic in ED with systolic BP in 74J, chronic L LE DVT. Pt with AMS, threatening vilolence. Admitted 05/10/20 for observation of acute encephalopathy, sinus bradycardia and paroxysmal A-fib.   Clinical Impression   Patient admitted with the above diagnosis.  Better participation this date.  He understands he is at the hospital, but is not sure why he came here.  Confusion, back pain, decreased safety are impacting self care and mobility currently.  Patient states he was able to care for himself at home, did not use a cane or walker, cut the grass and was able to manage his diabetes.  Currently he is moving slow and appears unsteady on his feet.  OT will follow in the acute setting, no HH is being recommended at this time, but could change depending on progress.      Follow Up Recommendations  No OT follow up    Equipment Recommendations       Recommendations for Other Services       Precautions / Restrictions Precautions Precautions: Fall Restrictions Weight Bearing Restrictions: No      Mobility Bed Mobility Overal bed mobility: Needs Assistance Bed Mobility: Supine to Sit     Supine to sit: Min guard;HOB elevated     General bed mobility comments: assist with IV line.  Transfers Overall transfer level: Needs assistance   Transfers: Stand Pivot Transfers   Stand pivot transfers: Min guard            Balance Overall balance assessment: Mild deficits observed, not formally tested                                          ADL either performed or assessed with clinical judgement   ADL Overall ADL's : Needs assistance/impaired Eating/Feeding: Set up;Bed level   Grooming: Wash/dry hands;Wash/dry face;Supervision/safety;Sitting               Lower Body Dressing: Sit to/from stand;Min guard   Toilet Transfer: Min guard           Functional mobility during ADLs: Min guard;Cueing for safety General ADL Comments: stand pivot to recliner. No AD used.     Vision Baseline Vision/History: No visual deficits Patient Visual Report: No change from baseline       Perception     Praxis      Pertinent Vitals/Pain Faces Pain Scale: Hurts a little bit Pain Location: generalized, nothing specific Pain Descriptors / Indicators: Grimacing Pain Intervention(s): Monitored during session;Repositioned     Hand Dominance Right   Extremity/Trunk Assessment Upper Extremity Assessment Upper Extremity Assessment: Generalized weakness   Lower Extremity Assessment Lower Extremity Assessment: Defer to PT evaluation       Communication Communication Communication: No difficulties   Cognition Arousal/Alertness: Awake/alert Behavior During Therapy: WFL for tasks assessed/performed Overall Cognitive Status: No family/caregiver present to determine baseline cognitive functioning Area of Impairment: Safety/judgement;Awareness;Memory  Orientation Level: Situation                 General Comments       Exercises     Shoulder Instructions      Home Living Family/patient expects to be discharged to:: Private residence Living Arrangements: Spouse/significant other Available Help at Discharge: Family Type of Home: Mobile home Home Access: Stairs to enter Entrance Stairs-Number of Steps: 2 Entrance Stairs-Rails: Can reach both Home Layout: One level               Home Equipment: Walker - 2 wheels;Cane - single point;Wheelchair -  manual          Prior Functioning/Environment                   OT Problem List: Decreased activity tolerance;Impaired balance (sitting and/or standing);Decreased safety awareness;Pain      OT Treatment/Interventions: Self-care/ADL training;Therapeutic exercise;Therapeutic activities;Balance training    OT Goals(Current goals can be found in the care plan section) Acute Rehab OT Goals Patient Stated Goal: I'm waiting to go home OT Goal Formulation: With patient Time For Goal Achievement: 05/25/20 Potential to Achieve Goals: Good ADL Goals Pt Will Perform Grooming: Independently;standing Pt Will Perform Lower Body Bathing: with set-up;sit to/from stand Pt Will Perform Lower Body Dressing: with set-up;sit to/from stand Pt Will Transfer to Toilet: with modified independence;ambulating  OT Frequency: Min 2X/week   Barriers to D/C: Other (comment)  none noted       Co-evaluation              AM-PAC OT "6 Clicks" Daily Activity     Outcome Measure Help from another person eating meals?: None Help from another person taking care of personal grooming?: A Little Help from another person toileting, which includes using toliet, bedpan, or urinal?: A Little Help from another person bathing (including washing, rinsing, drying)?: A Little Help from another person to put on and taking off regular upper body clothing?: A Little Help from another person to put on and taking off regular lower body clothing?: A Little 6 Click Score: 19   End of Session Equipment Utilized During Treatment: Gait belt Nurse Communication: Mobility status  Activity Tolerance: Patient tolerated treatment well Patient left: in chair;with call bell/phone within reach;with chair alarm set  OT Visit Diagnosis: Unsteadiness on feet (R26.81);Muscle weakness (generalized) (M62.81);Other symptoms and signs involving cognitive function                Time: 0920-0950 OT Time Calculation (min): 30  min Charges:  OT General Charges $OT Visit: 1 Visit OT Evaluation $OT Eval Moderate Complexity: 1 Mod OT Treatments $Self Care/Home Management : 8-22 mins  05/12/2020  Rich, OTR/L  Acute Rehabilitation Services  Office:  [place] 05/12/2020, 10:02 AM

## 2020-05-12 NOTE — Progress Notes (Signed)
PROGRESS NOTE    Erik Morales  KNL:[id number] DOB: 10-20-1940 DOA: 05/09/2020 PCP: [name], MD    Brief Narrative:  79 y.o. male with medical history significant for hypertension, type 2 diabetes mellitus, chronic kidney disease stage IV, paroxysmal atrial fibrillation not anticoagulated due to peptic ulcer disease with history of bleeding, and now presenting to the emergency department for evaluation of confusion and a fall.  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 Acute encephalopathy  - Presents at urging of family who report that he has been more confused recently and fell 3 or 4 days ago  - Head CT is negative for acute findings, ammonia level normal, and no apparent infection  - Check TSH, B12, thiamine, and RPR, pending  - Mentation seems much improved after receiving IVF overnight -Was seen by PT 9/22 with recs for SNF at that time. As pt has shown improvement, will ask PT to re-evaluate. No OT needs noted.  2. Left leg DVT  - No evidence for PE or phlegmasia  - ED physician discussed with vascular surgeon who felt that this is chronic and patient not candidate for [facility] or procedure   -Will cont to monitor for now  3. Sinus bradycardia  - HR in 40-50s in ED without chest pain or syncope, sinus rhythm confirmed on EKG  - Atenolol and amiodarone initially noted prior to admit -Atenolol held. Remained bradycardic this AM with HR in the 30-40's -Held amiodarone -continue cardiac monitoring   -TSH pending  4. Paroxysmal atrial fibrillation  - In sinus rhythm on admission  - Not anticoagulated d/t hx of GIB  - Amiodarone and beta blocker now  held due to bradycardia     5. CKD IV  - SCr is 2.53 on admission, similar to priors  - Would renally-dose medications, monitor    6. Hypertension  - There were some low BP readings in ED and antihypertensives held on admission   - BP stable at this time  7. Type II DM  - A1c was 6.4% last month  -Continue low-intensity SSI for now    8. Leukocytosis  - WBC is 13,700 on admission without fever or any apparent infectious process  - Blood cx pending   9. Dehydration -Mucus membranes less dry today -Improved with IVF hydration overnight  DVT prophylaxis: Heparin subq Code Status: Full Family Communication: Pt in room, family not at bedside  Status is: Observation  The patient will require care spanning > 2 midnights and should be moved to inpatient because: Unsafe d/c plan, IV treatments appropriate due to intensity of illness or inability to take PO and Inpatient level of care appropriate due to severity of illness  Dispo: The patient is from: Home              Anticipated d/c is to: SNF              Anticipated d/c date is: > 3 days              Patient currently is not medically stable to d/c.    Consultants:     Procedures:     Antimicrobials: Anti-infectives (From  admission, onward)   None      Subjective: Reports feeling better. Questioning about going home soon  Objective: Vitals:   05/11/20 1610 05/11/20 1925 05/12/20 0524 05/12/20 1122  BP: (!) 125/56 (!) 126/58 116/74 (!) 146/64  Pulse: (!) 41 (!) 42 (!) 48 (!) 51  Resp: 18 18  18   Temp: 98.8 F (37.1 C) 99 F (37.2 C) 98.1 F (36.7 C) 98.6 F (37 C)  TempSrc: Oral Oral Oral Oral  SpO2: 96% 90% 94% 100%  Weight:   98.2 kg   Height:        Intake/Output Summary (Last 24 hours) at 05/12/2020 1502 Last data filed at 05/12/2020 1419 Gross per 24 hour  Intake 856.87 ml  Output 750 ml  Net 106.87 ml   Filed Weights   05/10/20 1903 05/12/20 0524  Weight: 99.8 kg 98.2 kg     Examination: General exam: Awake, laying in bed, in nad Respiratory system: Normal respiratory effort, no wheezing Cardiovascular system: regular rate, s1, s2 Gastrointestinal system: Soft, nondistended, positive BS Central nervous system: CN2-12 grossly intact, strength intact Extremities: Perfused, no clubbing Skin: Normal skin turgor, no notable skin lesions seen Psychiatry: Mood normal // no visual hallucinations   Data Reviewed: I have personally reviewed following labs and imaging studies  CBC: Recent Labs  Lab 05/09/20 2208 05/10/20 1644 05/12/20 0938  WBC 13.7*  --  12.1*  HGB 12.3* 11.6* 10.5*  HCT 38.4* 34.0* 32.9*  MCV 94.8  --  93.5  PLT 267  --  962   Basic Metabolic Panel: Recent Labs  Lab 05/09/20 2208 05/10/20 1644 05/12/20 0938  NA 140 146* 143  K 4.2 4.0 3.6  CL 111  --  114*  CO2 17*  --  21*  GLUCOSE 140*  --  152*  BUN 47*  --  30*  CREATININE 2.53*  --  1.84*  CALCIUM 8.9  --  8.2*  MG  --   --  2.1   GFR: Estimated Creatinine Clearance: 35.8 mL/min (A) (by C-G formula based on SCr of 1.84 mg/dL (H)). Liver Function Tests: No results for input(s): AST, ALT, ALKPHOS, BILITOT, PROT, ALBUMIN in the last 168 hours. No results for input(s): LIPASE, AMYLASE in the last 168 hours. Recent Labs  Lab 05/10/20 1635  AMMONIA 19   Coagulation Profile: No results for input(s): INR, PROTIME in the last 168 hours. Cardiac Enzymes: No results for input(s): CKTOTAL, CKMB, CKMBINDEX, TROPONINI in the last 168 hours. BNP (last 3 results) No results for input(s): PROBNP in the last 8760 hours. HbA1C: No results for input(s): HGBA1C in the last 72 hours. CBG: Recent Labs  Lab 05/11/20 1144 05/11/20 1609 05/11/20 2056 05/12/20 0559 05/12/20 1116  GLUCAP 88 121* 103* 79 101*   Lipid Profile: No results for input(s): CHOL, HDL, LDLCALC, TRIG, CHOLHDL, LDLDIRECT in the last 72 hours. Thyroid Function Tests: No results for input(s): TSH,  T4TOTAL, FREET4, T3FREE, THYROIDAB in the last 72 hours. Anemia Panel: No results for input(s): VITAMINB12, FOLATE, FERRITIN, TIBC, IRON, RETICCTPCT in the last 72 hours. Sepsis Labs: Recent Labs  Lab 05/09/20 2208  LATICACIDVEN 0.9    Recent Results (from the past 240 hour(s))  SARS Coronavirus 2 by RT PCR (hospital order, performed in [facility] hospital lab) Nasopharyngeal Nasopharyngeal Swab     Status: None   Collection Time: 05/10/20  8:17 PM   Specimen: Nasopharyngeal Swab  Result Value Ref Range Status   SARS Coronavirus 2 NEGATIVE NEGATIVE  Final    Comment: (NOTE) SARS-CoV-2 target nucleic acids are NOT DETECTED.  The SARS-CoV-2 RNA is generally detectable in upper and lower respiratory specimens during the acute phase of infection. The lowest concentration of SARS-CoV-2 viral copies this assay can detect is 250 copies / mL. A negative result does not preclude SARS-CoV-2 infection and should not be used as the sole basis for treatment or other patient management decisions.  A negative result may occur with improper specimen collection / handling, submission of specimen other than nasopharyngeal swab, presence of viral mutation(s) within the areas targeted by this assay, and inadequate number of viral copies (<250 copies / mL). A negative result must be combined with clinical observations, patient history, and epidemiological information.  Fact Sheet for Patients:   StrictlyIdeas.no  Fact Sheet for Healthcare Providers: BankingDealers.co.za  This test is not yet approved or  cleared by the [place] FDA and has been authorized for detection and/or diagnosis of SARS-CoV-2 by FDA under an Emergency Use Authorization (EUA).  This EUA will remain in effect (meaning this test can be used) for the duration of the COVID-19 declaration under Section 564(b)(1) of the Act, 21 U.S.C. section 360bbb-3(b)(1), unless the  authorization is terminated or revoked sooner.  Performed at [facility], [place] [address], [place], [place] [address]   Urine culture     Status: Abnormal   Collection Time: 05/10/20 10:16 PM   Specimen: Urine, Random  Result Value Ref Range Status   Specimen Description URINE, RANDOM  Final   Special Requests NONE  Final   Culture (A)  Final    <10,000 COLONIES/mL INSIGNIFICANT GROWTH Performed at [facility], [place] [address], [place], [place] [address]    Report Status 05/11/2020 FINAL  Final     Radiology Studies: CT Head Wo Contrast  Result Date: 05/10/2020 CLINICAL DATA:  Fall 3 days ago, altered. Difficulty walking and increased weakness per family. Left leg swelling. EXAM: CT HEAD WITHOUT CONTRAST CT CERVICAL SPINE WITHOUT CONTRAST TECHNIQUE: Multidetector CT imaging of the head and cervical spine was performed following the standard protocol without intravenous contrast. Multiplanar CT image reconstructions of the cervical spine were also generated. COMPARISON:  None. FINDINGS: CT HEAD FINDINGS Brain: Cerebral ventricle sizes are concordant with the degree of cerebral volume loss. Patchy and confluent areas of decreased attenuation are noted throughout the deep and periventricular white matter of the cerebral hemispheres bilaterally, compatible with chronic microvascular ischemic disease. No evidence of large-territorial acute infarction. No parenchymal hemorrhage. No mass lesion. No extra-axial collection. No mass effect or midline shift. No hydrocephalus. Basilar cisterns are patent. Vascular: No hyperdense vessel or unexpected calcification. Skull: Negative for fracture or focal lesion. Sinuses/Orbits: Mucosal thickening with likely inspissated mucus within the left sphenoid sinus. Otherwise paranasal sinuses mastoid air cells are clear with improved aeration compared to prior CT. Other: None CT CERVICAL SPINE FINDINGS Alignment: Normal.  Patient's head appears to be  rotated. Skull base and vertebrae: Vague cortical irregularity on sagittal view posterior to the base of dens likely represents an old healed fracture versus degenerative changes (8:27). Multilevel discontinues bulky osteophyte formation well-corticated. Multilevel facet arthropathy and uncovertebral arthropathy. No acute fracture. No primary bone lesion or focal pathologic process. Soft tissues and spinal canal: No prevertebral fluid or swelling. No visible canal hematoma. Disc levels: Multilevel intervertebral disc space narrowing mild-to-moderate. Upper chest: Negative. Other: No acute displaced fracture of the visualized ribs. IMPRESSION: 1. No acute intracranial abnormality. 2. No acute  displaced fracture or traumatic listhesis of the cervical spine. Electronically Signed   By: Iven Finn M.D.   On: 05/10/2020 18:50   CT Cervical Spine Wo Contrast  Result Date: 05/10/2020 CLINICAL DATA:  Fall 3 days ago, altered. Difficulty walking and increased weakness per family. Left leg swelling. EXAM: CT HEAD WITHOUT CONTRAST CT CERVICAL SPINE WITHOUT CONTRAST TECHNIQUE: Multidetector CT imaging of the head and cervical spine was performed following the standard protocol without intravenous contrast. Multiplanar CT image reconstructions of the cervical spine were also generated. COMPARISON:  None. FINDINGS: CT HEAD FINDINGS Brain: Cerebral ventricle sizes are concordant with the degree of cerebral volume loss. Patchy and confluent areas of decreased attenuation are noted throughout the deep and periventricular white matter of the cerebral hemispheres bilaterally, compatible with chronic microvascular ischemic disease. No evidence of large-territorial acute infarction. No parenchymal hemorrhage. No mass lesion. No extra-axial collection. No mass effect or midline shift. No hydrocephalus. Basilar cisterns are patent. Vascular: No hyperdense vessel or unexpected calcification. Skull: Negative for fracture or focal  lesion. Sinuses/Orbits: Mucosal thickening with likely inspissated mucus within the left sphenoid sinus. Otherwise paranasal sinuses mastoid air cells are clear with improved aeration compared to prior CT. Other: None CT CERVICAL SPINE FINDINGS Alignment: Normal.  Patient's head appears to be rotated. Skull base and vertebrae: Vague cortical irregularity on sagittal view posterior to the base of dens likely represents an old healed fracture versus degenerative changes (8:27). Multilevel discontinues bulky osteophyte formation well-corticated. Multilevel facet arthropathy and uncovertebral arthropathy. No acute fracture. No primary bone lesion or focal pathologic process. Soft tissues and spinal canal: No prevertebral fluid or swelling. No visible canal hematoma. Disc levels: Multilevel intervertebral disc space narrowing mild-to-moderate. Upper chest: Negative. Other: No acute displaced fracture of the visualized ribs. IMPRESSION: 1. No acute intracranial abnormality. 2. No acute displaced fracture or traumatic listhesis of the cervical spine. Electronically Signed   By: Iven Finn M.D.   On: 05/10/2020 18:50   DG Chest Portable 1 View  Result Date: 05/10/2020 CLINICAL DATA:  Weakness fall EXAM: PORTABLE CHEST 1 VIEW COMPARISON:  October 03, 2018 FINDINGS: The cardiomediastinal silhouette is unchanged and enlarged in contour.Tortuous thoracic aorta. No pleural effusion. No pneumothorax. No acute pleuroparenchymal abnormality. Visualized abdomen is unremarkable. No acute displaced rib fracture visualized. IMPRESSION: 1. No acute cardiopulmonary abnormality. Electronically Signed   By: Valentino Saxon MD   On: 05/10/2020 15:21   VAS [place] LOWER EXTREMITY VENOUS (DVT) (ONLY [facility] & [facility])  Result Date: 05/10/2020  Lower Venous DVTStudy Indications: Edema.  Comparison Study: no prior Performing Technologist: Abram Sander RVS  Examination Guidelines: A complete evaluation includes B-mode imaging, spectral  Doppler, color Doppler, and power Doppler as needed of all accessible portions of each vessel. Bilateral testing is considered an integral part of a complete examination. Limited examinations for reoccurring indications may be performed as noted. The reflux portion of the exam is performed with the patient in reverse Trendelenburg.  +-----+---------------+---------+-----------+----------+--------------+ RIGHTCompressibilityPhasicitySpontaneityPropertiesThrombus Aging +-----+---------------+---------+-----------+----------+--------------+ CFV  Full           Yes      Yes                                 +-----+---------------+---------+-----------+----------+--------------+   +---------+---------------+---------+-----------+----------+-----------------+ LEFT     CompressibilityPhasicitySpontaneityPropertiesThrombus Aging    +---------+---------------+---------+-----------+----------+-----------------+ CFV      Partial        No  No                   Age Indeterminate +---------+---------------+---------+-----------+----------+-----------------+ SFJ      Partial                                      Age Indeterminate +---------+---------------+---------+-----------+----------+-----------------+ FV Prox  Partial                                      Age Indeterminate +---------+---------------+---------+-----------+----------+-----------------+ FV Mid   Partial                                      Age Indeterminate +---------+---------------+---------+-----------+----------+-----------------+ FV DistalPartial                                      Age Indeterminate +---------+---------------+---------+-----------+----------+-----------------+ PFV      Partial                                      Age Indeterminate +---------+---------------+---------+-----------+----------+-----------------+ POP      Partial        Yes      Yes                  Age  Indeterminate +---------+---------------+---------+-----------+----------+-----------------+ PTV      None                                         Age Indeterminate +---------+---------------+---------+-----------+----------+-----------------+ PERO                                                  Not visualized    +---------+---------------+---------+-----------+----------+-----------------+ SSV      None                                         Age Indeterminate +---------+---------------+---------+-----------+----------+-----------------+ EIV      Partial        Yes      Yes                                    +---------+---------------+---------+-----------+----------+-----------------+ common iliac vein & ivc are unable to visualize due to bowel gas    Summary: LEFT: - Findings consistent with age indeterminate deep vein thrombosis involving the left common femoral vein, SF junction, left femoral vein, left proximal profunda vein, left popliteal vein, left posterior tibial veins, and left soleal veins. - No cystic structure found in the popliteal fossa.  RIGHT: - No evidence of common femoral vein obstruction.  *See table(s) above for measurements and observations. Electronically signed by Deitra Mayo MD on 05/10/2020 at 4:44:01 PM. Report was modified by Deitra Mayo MD  on 05/10/2020 6:45:04 PM due to incorrect side on initial report.    Final (Updated)     Scheduled Meds: . heparin  5,000 Units Subcutaneous Q8H  . insulin aspart  0-9 Units Subcutaneous TID WC  . latanoprost  1 drop Both Eyes QHS  . montelukast  10 mg Oral QHS  . pantoprazole  20 mg Oral BID  . pravastatin  10 mg Oral q1800  . sodium chloride flush  3 mL Intravenous Q12H  . sodium chloride flush  3 mL Intravenous Q12H   Continuous Infusions: . sodium chloride    . sodium chloride 75 mL/hr at 05/12/20 1426     LOS: 0 days   Marylu Lund, MD [facility] Pager On Amion  If  7PM-7AM, please contact night-coverage 05/12/2020, 3:02 PM

## 2020-05-12 NOTE — Progress Notes (Signed)
Physical Therapy Treatment Patient Details Name: Erik Morales MRN: [id number] DOB: 04/04/1941 Today's Date: 05/12/2020    History of Present Illness Pt is an 79 y.o. male admitted 05/09/20 with confusion and fall. Found to be bradycardic and hypotensive, also with chronic LLE DVT. Workup for acute encephalopathy, PAF, bradycardia. Head CT negative for acute injury. PMH includes HTN, DM2, CKD IV, PAF.   PT Comments    Pt with improved cognition and participation this session. Today's session focused on transfer and gait training, pt required RW and minA for stability. Pt with improved orientation, but continues to demonstrate decreased attention, slowed processing and poor insight into deficits. Pt limited by this, generalized weakness, decreased activity tolerance and impaired balance; at high risk for falls. Spoke with pt and granddaughter (on phone) regarding d/c recommendations; recommend SNF-level therapies to maximize functional mobility and independence prior to return home; pt and family in agreement.    Follow Up Recommendations  SNF;Supervision for mobility/OOB     Equipment Recommendations   (defer)    Recommendations for Other Services       Precautions / Restrictions Precautions Precautions: Fall Restrictions Weight Bearing Restrictions: No    Mobility  Bed Mobility Overal bed mobility: Needs Assistance Bed Mobility: Supine to Sit     Supine to sit: Supervision;HOB elevated     General bed mobility comments: Distracted by lines, frequent cues for task, increased time and effort  Transfers Overall transfer level: Needs assistance Equipment used: Rolling walker (2 wheeled) Transfers: Sit to/from Stand Sit to Stand: Min assist         General transfer comment: Reliant on momentum to power into standing, minA for stability, cues for hand placement  Ambulation/Gait Ambulation/Gait assistance: Min guard;Min assist Gait Distance (Feet): 42 Feet Assistive  device: Rolling walker (2 wheeled) Gait Pattern/deviations: Step-through pattern;Decreased stride length;Staggering left;Staggering right;Trunk flexed Gait velocity: Decreased   General Gait Details: Slow, unsteady gait with RW and intermittent minA to correct LOB, especially with turns; pt with poor insight into balance deficits; frequent cues for redirection   Stairs             Wheelchair Mobility    Modified Rankin (Stroke Patients Only)       Balance Overall balance assessment: Needs assistance   Sitting balance-Leahy Scale: Fair       Standing balance-Leahy Scale: Fair Standing balance comment: Can static stand without UE support; dynamic stability improved with UE support                            Cognition Arousal/Alertness: Awake/alert Behavior During Therapy: WFL for tasks assessed/performed Overall Cognitive Status: No family/caregiver present to determine baseline cognitive functioning Area of Impairment: Attention;Memory;Following commands;Safety/judgement;Awareness;Problem solving;Orientation                 Orientation Level: Disoriented to;Situation Current Attention Level: Selective Memory: Decreased short-term memory Following Commands: Follows one step commands consistently;Follows one step commands with increased time;Follows multi-step commands inconsistently Safety/Judgement: Decreased awareness of deficits;Decreased awareness of safety Awareness: Intellectual;Emergent Problem Solving: Decreased initiation;Requires verbal cues;Requires tactile cues;Slow processing General Comments: Much more alert, oriented and appropriate this session. Easily distracted by lines/objects in room requiring frequent redirection to task; joking appropriately. Poor insight into deficits      Exercises      General Comments General comments (skin integrity, edema, etc.): Spoke with Janett Billow (granddaughter) on phone in pt's room regarding pt's  current functional status and  d/c recommendations - pt and family in agreement with SNF. Pt's wife unable to provide necessary physical assist      Pertinent Vitals/Pain Pain Assessment: No/denies pain Pain Intervention(s): Monitored during session    Home Living                      Prior Function            PT Goals (current goals can now be found in the care plan section) Acute Rehab PT Goals Patient Stated Goal: Agreeable to post-acute rehab at SNF PT Goal Formulation: With patient/family Time For Goal Achievement: 05/25/20 Potential to Achieve Goals: Good Progress towards PT goals: Progressing toward goals    Frequency    Min 2X/week      PT Plan Current plan remains appropriate    Co-evaluation              AM-PAC PT "6 Clicks" Mobility   Outcome Measure  Help needed turning from your back to your side while in a flat bed without using bedrails?: None Help needed moving from lying on your back to sitting on the side of a flat bed without using bedrails?: A Little Help needed moving to and from a bed to a chair (including a wheelchair)?: A Little Help needed standing up from a chair using your arms (e.g., wheelchair or bedside chair)?: A Little Help needed to walk in hospital room?: A Little Help needed climbing 3-5 steps with a railing? : A Lot 6 Click Score: 18    End of Session Equipment Utilized During Treatment: Gait belt Activity Tolerance: Patient tolerated treatment well Patient left: in chair;with call bell/phone within reach;with chair alarm set Nurse Communication: Mobility status PT Visit Diagnosis: Muscle weakness (generalized) (M62.81);Pain;History of falling (Z91.81);Difficulty in walking, not elsewhere classified (R26.2)     Time: 5929-2446 PT Time Calculation (min) (ACUTE ONLY): 28 min  Charges:  $Therapeutic Activity: 8-22 mins $Self Care/Home Management: 8-22                     Mabeline Caras, PT, DPT Acute  Rehabilitation Services  Pager [phone] Office [place] 05/12/2020, 5:35 PM

## 2020-05-12 NOTE — TOC Initial Note (Signed)
Transition of Care [facility]) - Initial/Assessment Note    Patient Details  Name: Erik Morales MRN: [id number] Date of Birth: [birth year]  Transition of Care [facility]) CM/SW Contact:    [address], [place] Phone Number: 05/12/2020, 4:46 PM  Clinical Narrative:                 CSW received consult for possible SNF placement at time of discharge. CSW spoke with patient's granddaughter Erik Morales via phone ([phone]) regarding PT recommendation of SNF placement at time of discharge as pt seemed to have fluctuating orientation. Patient's granddaughter reported that patient's spouse is currently unable to care for patient at their home given patient's current physical needs and fall risk. Patient's granddaughter expressed understanding of PT recommendation and is agreeable to SNF placement at time of discharge. Patient's granddaughter reports preference for [employer] area. CSW discussed insurance authorization process and provided Medicare SNF ratings list. Patient has not received the COVID vaccines and is not interested in receiving them. Patient's granddaughter expressed being hopeful for rehab and to feel better soon. No further questions reported at this time. CSW to continue to follow and assist with discharge planning needs.    Barriers to Discharge: Continued Medical Work up, Ship broker   Patient Goals and CMS Choice Patient states their goals for this hospitalization and ongoing recovery are:: Get better soon CMS Medicare.gov Compare Post Acute Care list provided to:: Patient Represenative (must comment) Erik Morales [phone]) Choice offered to / list presented to : Adult Children (Granddaughter)  Expected Discharge Plan and Services   In-house Referral: Clinical Social Work     Living arrangements for the past 2 months: Single Family Home                                      Prior Living Arrangements/Services Living arrangements for the past 2  months: Single Family Home Lives with:: Spouse Patient language and need for interpreter reviewed:: Yes Do you feel safe going back to the place where you live?: No   Wife not able to take care of  Need for Family Participation in Patient Care: Yes (Comment) Care giver support system in place?: No (comment)   Criminal Activity/Legal Involvement Pertinent to Current Situation/Hospitalization: No - Comment as needed  Activities of Daily Living      Permission Sought/Granted Permission sought to share information with : Case Manager, Customer service manager, Family Supports Permission granted to share information with : No (Fluctuating orientation)  Share Information with NAME: Erik Morales  Permission granted to share info w AGENCY: SNF's  Permission granted to share info w Relationship: Granddaughter  Permission granted to share info w Contact Information: [phone]  Emotional Assessment Appearance:: Appears stated age Attitude/Demeanor/Rapport: Guarded Affect (typically observed): Frustrated Orientation: : Oriented to Self, Oriented to Place, Oriented to  Time Alcohol / Substance Use: Not Applicable Psych Involvement: No (comment)  Admission diagnosis:  Bradycardia [R00.1] Acute encephalopathy [G93.40] Altered mental status, unspecified altered mental status type [R41.82] Chronic deep vein thrombosis (DVT) of left lower extremity, unspecified vein (HCC) [I82.502] Altered mental status [R41.82] Patient Active Problem List   Diagnosis Date Noted  . Altered mental status 05/12/2020  . Acute encephalopathy 05/10/2020  . Chronic deep vein thrombosis (DVT) of left lower extremity ([place]) 05/10/2020  . Sinus bradycardia 05/10/2020  . Nonsustained ventricular tachycardia ([place]) 09/04/2019  . PAF (paroxysmal atrial fibrillation) ([place]) 01/27/2019  .  Diabetes mellitus due to underlying condition with unspecified complications ([place]) 28/41/3244  . Abnormality of gait 12/05/2018   . Acute on chronic renal failure ([place]) 10/13/2018  . Hypotension   . CKD (chronic kidney disease), stage IV ([place])   . Hypoalbuminemia due to protein-calorie malnutrition ([place])   . History of GI bleed   . Chronic diastolic congestive heart failure ([place])   . Debility 10/02/2018  . Leukocytosis   . Duodenal ulcer with hemorrhage   . Melena   . Acute blood loss anemia   . Pressure injury of skin 09/26/2018  . Hematochezia 09/25/2018  . AKI (acute kidney injury) ([place]) 09/25/2018  . Severe sepsis ([place]) 09/25/2018  . Elevated troponin 09/25/2018  . Abnormal thyroid function test 09/25/2018  . HTN (hypertension) 09/25/2018  . E-coli UTI 09/25/2018  . CAP (community acquired pneumonia) 09/25/2018   PCP:  [name], MD Pharmacy:   [facility], [place] [address] Phone: [phone] Fax: [place], [place] [address] Phone: [phone] Fax: [phone]     Social Determinants of Health (SDOH) Interventions    Readmission Risk Interventions No flowsheet data found.

## 2020-05-13 LAB — GLUCOSE, CAPILLARY
Glucose-Capillary: 122 mg/dL — ABNORMAL HIGH (ref 70–99)
Glucose-Capillary: 122 mg/dL — ABNORMAL HIGH (ref 70–99)
Glucose-Capillary: 106 mg/dL — ABNORMAL HIGH (ref 70–99)
Glucose-Capillary: 120 mg/dL — ABNORMAL HIGH (ref 70–99)

## 2020-05-13 LAB — BASIC METABOLIC PANEL
Anion gap: 11 (ref 5–15)
BUN: 26 mg/dL — ABNORMAL HIGH (ref 8–23)
CO2: 18 mmol/L — ABNORMAL LOW (ref 22–32)
Calcium: 8.5 mg/dL — ABNORMAL LOW (ref 8.9–10.3)
Chloride: 112 mmol/L — ABNORMAL HIGH (ref 98–111)
Creatinine, Ser: 1.59 mg/dL — ABNORMAL HIGH (ref 0.61–1.24)
GFR calc Af Amer: 46 mL/min — ABNORMAL LOW (ref >60)
GFR calc non Af Amer: 40 mL/min — ABNORMAL LOW (ref >60)
Glucose, Bld: 118 mg/dL — ABNORMAL HIGH (ref 70–99)
Potassium: 3.6 mmol/L (ref 3.5–5.1)
Sodium: 141 mmol/L (ref 135–145)

## 2020-05-13 LAB — CBC
HCT: 35.7 % — ABNORMAL LOW (ref 39.0–52.0)
Hemoglobin: 11.5 g/dL — ABNORMAL LOW (ref 13.0–17.0)
MCH: 29.3 pg (ref 26.0–34.0)
MCHC: 32.2 g/dL (ref 30.0–36.0)
MCV: 91.1 fL (ref 80.0–100.0)
Platelets: 228 10*3/uL (ref 150–400)
RBC: 3.92 MIL/uL — ABNORMAL LOW (ref 4.22–5.81)
RDW: 13.5 % (ref 11.5–15.5)
WBC: 14.2 10*3/uL — ABNORMAL HIGH (ref 4.0–10.5)
nRBC: 0 % (ref 0.0–0.2)

## 2020-05-13 MED ORDER — HYDRALAZINE HCL 20 MG/ML IJ SOLN: 5.0000 mg | INTRAMUSCULAR | Status: DC | PRN

## 2020-05-13 MED ORDER — AMLODIPINE BESYLATE 5 MG PO TABS
5.0000 mg | ORAL_TABLET | Freq: Every day | ORAL | Status: DC
  Administered 2020-05-14 – 2020-05-17 (×4): 5 mg via ORAL
  Filled 2020-05-13 (×4): qty 1

## 2020-05-13 NOTE — Progress Notes (Signed)
Occupational Therapy Treatment Patient Details Name: Erik Morales MRN: [id number] DOB: 1941-06-11 Today's Date: 05/13/2020    History of present illness Pt is an 79 y.o. male admitted 05/09/20 with confusion and fall. Found to be bradycardic and hypotensive, also with chronic LLE DVT. Workup for acute encephalopathy, PAF, bradycardia. Head CT negative for acute injury. PMH includes HTN, DM2, CKD IV, PAF.   OT comments  Patient noted with increased confusion, low back pain and decreased mobility compared to prior dates eval. He was closer to Mod A with all mobility, and needed up to Min A to maintain EOB sitting.  Discussion with family regarding SNF placement took place yesterday. Patient left with lab in the room, nursing aware of declines to mobility. SNF placement for short term rehab is appropriate.  OT will continue to follow in the acute setting.    Follow Up Recommendations  SNF    Equipment Recommendations  3 in 1 bedside commode;Wheelchair (measurements OT);Wheelchair cushion (measurements OT)    Recommendations for Other Services      Precautions / Restrictions Precautions Precautions: Fall Restrictions Weight Bearing Restrictions: No       Mobility Bed Mobility Overal bed mobility: Needs Assistance Bed Mobility: Supine to Sit     Supine to sit: Mod assist        Transfers Overall transfer level: Needs assistance   Transfers: Sit to/from Stand Sit to Stand: Mod assist Stand pivot transfers: Mod assist            Balance Overall balance assessment: Needs assistance Sitting-balance support: Bilateral upper extremity supported Sitting balance-Leahy Scale: Poor     Standing balance support: Bilateral upper extremity supported Standing balance-Leahy Scale: Poor                             ADL either performed or assessed with clinical judgement   ADL       Grooming: Wash/dry hands;Wash/dry face;Sitting;Min guard   Upper Body Bathing:  Moderate assistance;Sitting Upper Body Bathing Details (indicate cue type and reason): thoroughness Lower Body Bathing: Maximal assistance;Sit to/from stand Lower Body Bathing Details (indicate cue type and reason): decreased stand tolerance and balance. Upper Body Dressing : Moderate assistance;Sitting   Lower Body Dressing: Moderate assistance;Sit to/from stand               Functional mobility during ADLs: Moderate assistance General ADL Comments: stand pivot to recliner. No AD used.                       Cognition Arousal/Alertness: Awake/alert Behavior During Therapy: Anxious;Flat affect Overall Cognitive Status: Impaired/Different from baseline Area of Impairment: Orientation;Memory;Safety/judgement;Problem solving                 Orientation Level: Disoriented to;Situation;Place;Time             General Comments: More confused this date and requiring increased assist for mobility.                    General Comments      Pertinent Vitals/ Pain       Faces Pain Scale: Hurts little more Pain Location: low back Pain Descriptors / Indicators: Grimacing;Guarding Pain Intervention(s): Limited activity within patient's tolerance;Monitored during session;Repositioned  Frequency  Min 2X/week        Progress Toward Goals  OT Goals(current goals can now be found in the care plan section)  Progress towards OT goals: Progressing toward goals  Acute Rehab OT Goals Patient Stated Goal: Patient unable to articulate OT Goal Formulation: With patient Time For Goal Achievement: 05/25/20 Potential to Achieve Goals: Good  Plan Discharge plan remains appropriate    Co-evaluation                 AM-PAC OT "6 Clicks" Daily Activity     Outcome Measure   Help from another person eating meals?: A Little Help from another person taking care of personal  grooming?: A Little Help from another person toileting, which includes using toliet, bedpan, or urinal?: A Lot Help from another person bathing (including washing, rinsing, drying)?: A Lot Help from another person to put on and taking off regular upper body clothing?: A Lot Help from another person to put on and taking off regular lower body clothing?: A Lot 6 Click Score: 14    End of Session Equipment Utilized During Treatment: Gait belt;Rolling walker  OT Visit Diagnosis: Unsteadiness on feet (R26.81);Muscle weakness (generalized) (M62.81);Other symptoms and signs involving cognitive function;Pain   Activity Tolerance Patient limited by pain   Patient Left in chair;with call bell/phone within reach;with chair alarm set;Other (comment) (lab in the room)   Nurse Communication          Time: [phone] OT Time Calculation (min): 25 min  Charges: OT General Charges $OT Visit: 1 Visit OT Treatments $Self Care/Home Management : 23-37 mins  05/13/2020  Erik Morales, Erik Morales  Acute Rehabilitation Services  Office:  [phone]    Erik Morales 05/13/2020, 9:51 AM

## 2020-05-13 NOTE — Progress Notes (Signed)
PROGRESS NOTE    Erik Morales  WLN:[id number] DOB: September 03, 1940 DOA: 05/09/2020 PCP: [name], MD    Brief Narrative:  79 y.o. male with medical history significant for hypertension, type 2 diabetes mellitus, chronic kidney disease stage IV, paroxysmal atrial fibrillation not anticoagulated due to peptic ulcer disease with history of bleeding, and now presenting to the emergency department for evaluation of confusion and a fall.  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 Acute encephalopathy  - Presents at urging of family who report that he has been more confused recently and fell 3 or 4 days ago  - Head CT is negative for acute findings, ammonia level normal, and no apparent infection  - Check TSH, B12, thiamine, and RPR, pending  - Mentation seems much improved after receiving IVF overnight -Was seen by PT 9/22 with recs for SNF at that time. As pt has shown improvement, pt was later re-evaluated by PT, recommendation remains for SNF placement  2. Left leg DVT  - No evidence for PE or phlegmasia  - ED physician discussed with vascular surgeon who felt that this is chronic and patient not candidate for [facility] or procedure   -Will continue to monitor for now  3. Sinus bradycardia  - HR in 40-50s in ED without chest pain or syncope, sinus rhythm confirmed on EKG  - Atenolol and amiodarone initially noted prior to admit -Atenolol held and amiodarone held. Have discussed with patient's primary Cardiologist, Dr. Geraldo Pitter who agrees -continue cardiac monitoring   -TSH was ordered at time of presentation, but for some reason was  discontinued. Will re-order  4. Paroxysmal atrial fibrillation  - In sinus rhythm on admission  - Not anticoagulated d/t hx of GIB  - Amiodarone and beta blocker now held due to bradycardia per above    5. CKD IV  - SCr is 2.53 on admission, similar to priors  - Renal function improved since receiving IVF  6. Hypertension  - There were some low BP readings in ED and antihypertensives held on admission   - BP now trending up. -Will start low dose norvasc and hydralazine PRN  7. Type II DM  - A1c was 6.4% last month  -Continue low-intensity SSI for now as needed   8. Leukocytosis  - WBC is 13,700 on admission without fever or any apparent infectious process  - Urine cx unremarkable   9. Dehydration -Mucus membranes less dry today -Seems improved with IVF hydration overnight. Will decrease IVF to 50cc/hr  DVT prophylaxis: Heparin subq Code Status: Full Family Communication: Pt in room, family not at bedside  Status is: Observation  The patient will require care spanning > 2 midnights and should be moved to inpatient because: Unsafe d/c plan, IV treatments appropriate due to intensity of illness or inability to take PO and Inpatient level of care appropriate due to severity of illness  Dispo: The patient is from: Home              Anticipated d/c is to: SNF              Anticipated d/c date is: 2 days  Patient currently is not medically stable to d/c.    Consultants:     Procedures:     Antimicrobials: Anti-infectives (From admission, onward)   None      Subjective: In good spirits. Without complaints. Conversant and oriented  Objective: Vitals:   05/12/20 1122 05/12/20 1927 05/13/20 0558 05/13/20 1212  BP: (!) 146/64 (!) 137/53 (!) 151/63 (!) 89/67  Pulse: (!) 51 (!) 49 (!) 51 (!) 58  Resp: 18 18 18 17   Temp: 98.6 F (37 C) 98 F (36.7 C) 97.8 F (36.6 C) 97.9 F (36.6 C)  TempSrc: Oral  Oral   SpO2: 100% 96% 100% 97%  Weight:    95.7 kg   Height:        Intake/Output Summary (Last 24 hours) at 05/13/2020 1751 Last data filed at 05/13/2020 1313 Gross per 24 hour  Intake 2759.26 ml  Output 1100 ml  Net 1659.26 ml   Filed Weights   05/10/20 1903 05/12/20 0524 05/13/20 0558  Weight: 99.8 kg 98.2 kg 95.7 kg    Examination: General exam: Conversant, in no acute distress Respiratory system: normal chest rise, clear, no audible wheezing Cardiovascular system: regular rhythm, s1-s2 Gastrointestinal system: Nondistended, nontender, pos BS Central nervous system: No seizures, no tremors Extremities: No cyanosis, no joint deformities Skin: No rashes, no pallor Psychiatry: Affect normal // no auditory hallucinations   Data Reviewed: I have personally reviewed following labs and imaging studies  CBC: Recent Labs  Lab 05/09/20 2208 05/10/20 1644 05/12/20 0938 05/13/20 0957  WBC 13.7*  --  12.1* 14.2*  HGB 12.3* 11.6* 10.5* 11.5*  HCT 38.4* 34.0* 32.9* 35.7*  MCV 94.8  --  93.5 91.1  PLT 267  --  219 740   Basic Metabolic Panel: Recent Labs  Lab 05/09/20 2208 05/10/20 1644 05/12/20 0938 05/13/20 0957  NA 140 146* 143 141  K 4.2 4.0 3.6 3.6  CL 111  --  114* 112*  CO2 17*  --  21* 18*  GLUCOSE 140*  --  152* 118*  BUN 47*  --  30* 26*  CREATININE 2.53*  --  1.84* 1.59*  CALCIUM 8.9  --  8.2* 8.5*  MG  --   --  2.1  --    GFR: Estimated Creatinine Clearance: 40.9 mL/min (A) (by C-G formula based on SCr of 1.59 mg/dL (H)). Liver Function Tests: No results for input(s): AST, ALT, ALKPHOS, BILITOT, PROT, ALBUMIN in the last 168 hours. No results for input(s): LIPASE, AMYLASE in the last 168 hours. Recent Labs  Lab 05/10/20 1635  AMMONIA 19   Coagulation Profile: No results for input(s): INR, PROTIME in the last 168 hours. Cardiac Enzymes: No results for input(s): CKTOTAL, CKMB, CKMBINDEX, TROPONINI in the last 168 hours. BNP (last 3 results) No results for input(s): PROBNP in the last 8760  hours. HbA1C: No results for input(s): HGBA1C in the last 72 hours. CBG: Recent Labs  Lab 05/12/20 1701 05/12/20 2105 05/13/20 0556 05/13/20 1213 05/13/20 1654  GLUCAP 116* 131* 122* 122* 106*   Lipid Profile: No results for input(s): CHOL, HDL, LDLCALC, TRIG, CHOLHDL, LDLDIRECT in the last 72 hours. Thyroid Function Tests: No results for input(s): TSH, T4TOTAL, FREET4, T3FREE, THYROIDAB in the last 72 hours. Anemia Panel: No results for input(s): VITAMINB12, FOLATE, FERRITIN, TIBC, IRON, RETICCTPCT in the last 72 hours. Sepsis Labs: Recent Labs  Lab 05/09/20 2208  LATICACIDVEN 0.9    Recent Results (from the past 240 hour(s))  SARS Coronavirus 2  by RT PCR (hospital order, performed in [facility] hospital lab) Nasopharyngeal Nasopharyngeal Swab     Status: None   Collection Time: 05/10/20  8:17 PM   Specimen: Nasopharyngeal Swab  Result Value Ref Range Status   SARS Coronavirus 2 NEGATIVE NEGATIVE Final    Comment: (NOTE) SARS-CoV-2 target nucleic acids are NOT DETECTED.  The SARS-CoV-2 RNA is generally detectable in upper and lower respiratory specimens during the acute phase of infection. The lowest concentration of SARS-CoV-2 viral copies this assay can detect is 250 copies / mL. A negative result does not preclude SARS-CoV-2 infection and should not be used as the sole basis for treatment or other patient management decisions.  A negative result may occur with improper specimen collection / handling, submission of specimen other than nasopharyngeal swab, presence of viral mutation(s) within the areas targeted by this assay, and inadequate number of viral copies (<250 copies / mL). A negative result must be combined with clinical observations, patient history, and epidemiological information.  Fact Sheet for Patients:   StrictlyIdeas.no  Fact Sheet for Healthcare Providers: BankingDealers.co.za  This test is not  yet approved or  cleared by the [place] FDA and has been authorized for detection and/or diagnosis of SARS-CoV-2 by FDA under an Emergency Use Authorization (EUA).  This EUA will remain in effect (meaning this test can be used) for the duration of the COVID-19 declaration under Section 564(b)(1) of the Act, 21 U.S.C. section 360bbb-3(b)(1), unless the authorization is terminated or revoked sooner.  Performed at [facility], [place] [address], [place], [place] [address]   Urine culture     Status: Abnormal   Collection Time: 05/10/20 10:16 PM   Specimen: Urine, Random  Result Value Ref Range Status   Specimen Description URINE, RANDOM  Final   Special Requests NONE  Final   Culture (A)  Final    <10,000 COLONIES/mL INSIGNIFICANT GROWTH Performed at [facility], [place] [address] [place], [place] [address]    Report Status 05/11/2020 FINAL  Final     Radiology Studies: No results found.  Scheduled Meds: . heparin  5,000 Units Subcutaneous Q8H  . insulin aspart  0-9 Units Subcutaneous TID WC  . latanoprost  1 drop Both Eyes QHS  . montelukast  10 mg Oral QHS  . pantoprazole  20 mg Oral BID  . pravastatin  10 mg Oral q1800  . sodium chloride flush  3 mL Intravenous Q12H  . sodium chloride flush  3 mL Intravenous Q12H   Continuous Infusions: . sodium chloride    . sodium chloride 75 mL/hr at 05/13/20 1551     LOS: 1 day   Marylu Lund, MD [facility] Pager On Amion  If 7PM-7AM, please contact night-coverage 05/13/2020, 5:51 PM

## 2020-05-14 LAB — GLUCOSE, CAPILLARY
Glucose-Capillary: 110 mg/dL — ABNORMAL HIGH (ref 70–99)
Glucose-Capillary: 118 mg/dL — ABNORMAL HIGH (ref 70–99)
Glucose-Capillary: 129 mg/dL — ABNORMAL HIGH (ref 70–99)
Glucose-Capillary: 111 mg/dL — ABNORMAL HIGH (ref 70–99)

## 2020-05-14 LAB — COMPREHENSIVE METABOLIC PANEL
ALT: 23 U/L (ref 0–44)
AST: 26 U/L (ref 15–41)
Albumin: 3.1 g/dL — ABNORMAL LOW (ref 3.5–5.0)
Alkaline Phosphatase: 74 U/L (ref 38–126)
Anion gap: 8 (ref 5–15)
BUN: 24 mg/dL — ABNORMAL HIGH (ref 8–23)
CO2: 22 mmol/L (ref 22–32)
Calcium: 8.5 mg/dL — ABNORMAL LOW (ref 8.9–10.3)
Chloride: 111 mmol/L (ref 98–111)
Creatinine, Ser: 1.6 mg/dL — ABNORMAL HIGH (ref 0.61–1.24)
GFR calc Af Amer: 46 mL/min — ABNORMAL LOW (ref >60)
GFR calc non Af Amer: 40 mL/min — ABNORMAL LOW (ref >60)
Glucose, Bld: 107 mg/dL — ABNORMAL HIGH (ref 70–99)
Potassium: 3.3 mmol/L — ABNORMAL LOW (ref 3.5–5.1)
Sodium: 141 mmol/L (ref 135–145)
Total Bilirubin: 1 mg/dL (ref 0.3–1.2)
Total Protein: 6 g/dL — ABNORMAL LOW (ref 6.5–8.1)

## 2020-05-14 LAB — TSH: TSH: 1.43 u[IU]/mL (ref 0.350–4.500)

## 2020-05-14 MED ORDER — POTASSIUM CHLORIDE CRYS ER 20 MEQ PO TBCR
60.0000 meq | EXTENDED_RELEASE_TABLET | Freq: Once | ORAL | Status: AC
  Administered 2020-05-14: 60 meq via ORAL
  Filled 2020-05-14: qty 3

## 2020-05-14 MED ORDER — SODIUM CHLORIDE 0.9 % IV SOLN: INTRAVENOUS | Status: DC

## 2020-05-14 NOTE — Plan of Care (Signed)

## 2020-05-14 NOTE — Progress Notes (Signed)
PROGRESS NOTE    Erik Morales  NTI:[id number] DOB: 09-Jul-1941 DOA: 05/09/2020 PCP: [name], MD    Brief Narrative:  79 y.o. male with medical history significant for hypertension, type 2 diabetes mellitus, chronic kidney disease stage IV, paroxysmal atrial fibrillation not anticoagulated due to peptic ulcer disease with history of bleeding, and now presenting to the emergency department for evaluation of confusion and a fall.  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 Acute encephalopathy  - Presents at urging of family who report that he has been more confused recently and fell 3 or 4 days ago  - Head CT is negative for acute findings, ammonia level normal, and no apparent infection  - Check TSH, B12, thiamine, and RPR, pending  - Mentation seems much improved after receiving IVF overnight -Was seen by PT 9/22 with recs for SNF at that time. As pt has shown improvement, pt was later re-evaluated by PT, recommendation remains for SNF placement.  Social worker following for placement  2. Left leg DVT  - No evidence for PE or phlegmasia  - ED physician discussed with vascular surgeon on-call who felt that this is chronic and patient not candidate for [facility] or procedure   -Will continue to monitor for now  3. Sinus bradycardia  - HR in 40-50s in ED without chest pain or syncope, sinus rhythm confirmed on EKG  - Atenolol and amiodarone initially noted prior to admit -Atenolol held and amiodarone held.  Had called and discussed with patient's primary Cardiologist, Dr. Geraldo Pitter who agrees -continue cardiac monitoring   -TSH noted to  be within normal range, reviewed  4. Paroxysmal atrial fibrillation  - In sinus rhythm on admission  - Not anticoagulated d/t hx of GIB  - Amiodarone and beta blocker now held due to bradycardia  as noted above   5. CKD IV  - SCr is 2.53 on admission, similar to priors  - Renal function had improved since receiving IVF  6. Hypertension  - There were some low BP readings in ED and antihypertensives held on admission   - BP now trending up. -Now on low-dose Norvasc with hydralazine PRN  7. Type II DM  - A1c was 6.4% last month  -Continue low-intensity SSI for now as needed   8. Leukocytosis  - WBC is 13,700 on admission without fever or any apparent infectious process  - Urine cx reviewed and is unremarkable   9. Dehydration -Mucus membranes appear dry today -Clinically improved since receiving IV fluid hydration.  We will continue gentle IV fluid hydration as tolerated  DVT prophylaxis: Heparin subq Code Status: Full Family Communication: Pt in room, family not at bedside, have been updating family over phone  Status is: Inpatient  The patient will require care spanning > 2 midnights and should be moved to inpatient because: Unsafe d/c plan, IV treatments appropriate due to intensity of illness or inability to take PO and Inpatient level of care appropriate due to severity of illness  Dispo: The patient is from: Home              Anticipated d/c is to: SNF  Anticipated d/c date is: 2 days              Patient currently is medically stable to d/c.  Just awaiting disposition    Consultants:     Procedures:     Antimicrobials: Anti-infectives (From admission, onward)   None      Subjective: Somewhat confused this morning although patient seems to converse appropriately.  On discussion with family member yesterday, patient historically noted to have periods of confusion in the evening and early morning whenever he is  hospitalized  Objective: Vitals:   05/13/20 1800 05/13/20 1941 05/14/20 0059 05/14/20 0314  BP: 115/70 114/67  140/64  Pulse:  (!) 51  (!) 51  Resp:  18  15  Temp:  98.8 F (37.1 C)  98.4 F (36.9 C)  TempSrc:  Oral  Oral  SpO2:  100%  98%  Weight:   97.5 kg   Height:        Intake/Output Summary (Last 24 hours) at 05/14/2020 1309 Last data filed at 05/14/2020 0957 Gross per 24 hour  Intake 2335.51 ml  Output 200 ml  Net 2135.51 ml   Filed Weights   05/12/20 0524 05/13/20 0558 05/14/20 0059  Weight: 98.2 kg 95.7 kg 97.5 kg    Examination: General exam: Awake, laying in bed, in nad Respiratory system: Normal respiratory effort, no wheezing Cardiovascular system: regular rate, s1, s2 Gastrointestinal system: Soft, nondistended, positive BS Central nervous system: CN2-12 grossly intact, strength intact Extremities: Perfused, no clubbing Skin: Normal skin turgor, no notable skin lesions seen Psychiatry: Mood normal // no visual hallucinations    Data Reviewed: I have personally reviewed following labs and imaging studies  CBC: Recent Labs  Lab 05/09/20 2208 05/10/20 1644 05/12/20 0938 05/13/20 0957  WBC 13.7*  --  12.1* 14.2*  HGB 12.3* 11.6* 10.5* 11.5*  HCT 38.4* 34.0* 32.9* 35.7*  MCV 94.8  --  93.5 91.1  PLT 267  --  219 703   Basic Metabolic Panel: Recent Labs  Lab 05/09/20 2208 05/10/20 1644 05/12/20 0938 05/13/20 0957 05/14/20 0541  NA 140 146* 143 141 141  K 4.2 4.0 3.6 3.6 3.3*  CL 111  --  114* 112* 111  CO2 17*  --  21* 18* 22  GLUCOSE 140*  --  152* 118* 107*  BUN 47*  --  30* 26* 24*  CREATININE 2.53*  --  1.84* 1.59* 1.60*  CALCIUM 8.9  --  8.2* 8.5* 8.5*  MG  --   --  2.1  --   --    GFR: Estimated Creatinine Clearance: 41 mL/min (A) (by C-G formula based on SCr of 1.6 mg/dL (H)). Liver Function Tests: Recent Labs  Lab 05/14/20 0541  AST 26  ALT 23  ALKPHOS 74  BILITOT 1.0  PROT 6.0*  ALBUMIN 3.1*   No results for  input(s): LIPASE, AMYLASE in the last 168 hours. Recent Labs  Lab 05/10/20 1635  AMMONIA 19   Coagulation Profile: No results for input(s): INR, PROTIME in the last 168 hours. Cardiac Enzymes: No results for input(s): CKTOTAL, CKMB, CKMBINDEX, TROPONINI in the last 168 hours. BNP (last 3 results) No results for input(s): PROBNP in the last 8760 hours. HbA1C: No results for input(s): HGBA1C in the last 72 hours. CBG: Recent Labs  Lab 05/13/20 0556 05/13/20 1213 05/13/20 1654 05/13/20 2201 05/14/20 0610  GLUCAP 122* 122* 106* 120* 110*   Lipid Profile: No results for input(s): CHOL, HDL, LDLCALC, TRIG, CHOLHDL,  LDLDIRECT in the last 72 hours. Thyroid Function Tests: Recent Labs    05/14/20 0541  TSH 1.430   Anemia Panel: No results for input(s): VITAMINB12, FOLATE, FERRITIN, TIBC, IRON, RETICCTPCT in the last 72 hours. Sepsis Labs: Recent Labs  Lab 05/09/20 2208  LATICACIDVEN 0.9    Recent Results (from the past 240 hour(s))  SARS Coronavirus 2 by RT PCR (hospital order, performed in [facility] hospital lab) Nasopharyngeal Nasopharyngeal Swab     Status: None   Collection Time: 05/10/20  8:17 PM   Specimen: Nasopharyngeal Swab  Result Value Ref Range Status   SARS Coronavirus 2 NEGATIVE NEGATIVE Final    Comment: (NOTE) SARS-CoV-2 target nucleic acids are NOT DETECTED.  The SARS-CoV-2 RNA is generally detectable in upper and lower respiratory specimens during the acute phase of infection. The lowest concentration of SARS-CoV-2 viral copies this assay can detect is 250 copies / mL. A negative result does not preclude SARS-CoV-2 infection and should not be used as the sole basis for treatment or other patient management decisions.  A negative result may occur with improper specimen collection / handling, submission of specimen other than nasopharyngeal swab, presence of viral mutation(s) within the areas targeted by this assay, and inadequate number of viral  copies (<250 copies / mL). A negative result must be combined with clinical observations, patient history, and epidemiological information.  Fact Sheet for Patients:   StrictlyIdeas.no  Fact Sheet for Healthcare Providers: BankingDealers.co.za  This test is not yet approved or  cleared by the [place] FDA and has been authorized for detection and/or diagnosis of SARS-CoV-2 by FDA under an Emergency Use Authorization (EUA).  This EUA will remain in effect (meaning this test can be used) for the duration of the COVID-19 declaration under Section 564(b)(1) of the Act, 21 U.S.C. section 360bbb-3(b)(1), unless the authorization is terminated or revoked sooner.  Performed at [facility], [place] [address] [place], [place] [address]   Urine culture     Status: Abnormal   Collection Time: 05/10/20 10:16 PM   Specimen: Urine, Random  Result Value Ref Range Status   Specimen Description URINE, RANDOM  Final   Special Requests NONE  Final   Culture (A)  Final    <10,000 COLONIES/mL INSIGNIFICANT GROWTH Performed at [facility], [place] [address], [place], [place] [address]    Report Status 05/11/2020 FINAL  Final     Radiology Studies: No results found.  Scheduled Meds: . amLODipine  5 mg Oral Daily  . heparin  5,000 Units Subcutaneous Q8H  . insulin aspart  0-9 Units Subcutaneous TID WC  . latanoprost  1 drop Both Eyes QHS  . montelukast  10 mg Oral QHS  . pantoprazole  20 mg Oral BID  . pravastatin  10 mg Oral q1800  . sodium chloride flush  3 mL Intravenous Q12H  . sodium chloride flush  3 mL Intravenous Q12H   Continuous Infusions: . sodium chloride    . sodium chloride 50 mL/hr at 05/14/20 1058     LOS: 2 days   Marylu Lund, MD [facility] Pager On Amion  If 7PM-7AM, please contact night-coverage 05/14/2020, 1:09 PM

## 2020-05-14 NOTE — TOC Progression Note (Signed)
Transition of Care [facility]) - Progression Note     Patient Details  Name: Erik Morales MRN: [id number] Date of Birth: [birth year]  Transition of Care [facility]) CM/SW [place], [place] Phone Number: [phone] 05/14/2020, 2:10 PM  Clinical Narrative:     CSW spoke with patient's granddaughter Janett Billow and she asked that patient's wife be contacted. CSW spoke with [name] and let her know that patient had one bed offer. She inquired about facilities near patient's home. CSW informed [name] that the referrals could be sent to facilities closer to his home. CSW provided [name] with the medicare.gov website for her to review the ratings.  TOC team will continue to assist with discharge planning needs.     Barriers to Discharge: Continued Medical Work up, Orthoptist and Services   In-house Referral: Clinical Social Work     Living arrangements for the past 2 months: Single Family Home                                       Social Determinants of Health (SDOH) Interventions    Readmission Risk Interventions No flowsheet data found.

## 2020-05-14 NOTE — Progress Notes (Signed)
Pt lost Iv access and refused to "pooked" again. Md Made aware.

## 2020-05-14 NOTE — Progress Notes (Signed)
Brother in Sports coach Clair Gulling came to see the pt, spoke to [place] and  gave him pt's wrist watch and two rings D/t pt is so fidgety and afraid to get it lost. . Gave him updates. Pt ok'd to add Clair Gulling as Arboriculturist.

## 2020-05-15 LAB — COMPREHENSIVE METABOLIC PANEL
ALT: 23 U/L (ref 0–44)
AST: 24 U/L (ref 15–41)
Albumin: 3.1 g/dL — ABNORMAL LOW (ref 3.5–5.0)
Alkaline Phosphatase: 78 U/L (ref 38–126)
Anion gap: 13 (ref 5–15)
BUN: 29 mg/dL — ABNORMAL HIGH (ref 8–23)
CO2: 18 mmol/L — ABNORMAL LOW (ref 22–32)
Calcium: 8.8 mg/dL — ABNORMAL LOW (ref 8.9–10.3)
Chloride: 111 mmol/L (ref 98–111)
Creatinine, Ser: 1.95 mg/dL — ABNORMAL HIGH (ref 0.61–1.24)
GFR calc Af Amer: 36 mL/min — ABNORMAL LOW (ref >60)
GFR calc non Af Amer: 31 mL/min — ABNORMAL LOW (ref >60)
Glucose, Bld: 129 mg/dL — ABNORMAL HIGH (ref 70–99)
Potassium: 4 mmol/L (ref 3.5–5.1)
Sodium: 142 mmol/L (ref 135–145)
Total Bilirubin: 1.1 mg/dL (ref 0.3–1.2)
Total Protein: 6.2 g/dL — ABNORMAL LOW (ref 6.5–8.1)

## 2020-05-15 LAB — CBC
HCT: 37.6 % — ABNORMAL LOW (ref 39.0–52.0)
Hemoglobin: 12.2 g/dL — ABNORMAL LOW (ref 13.0–17.0)
MCH: 30.3 pg (ref 26.0–34.0)
MCHC: 32.4 g/dL (ref 30.0–36.0)
MCV: 93.5 fL (ref 80.0–100.0)
Platelets: 253 10*3/uL (ref 150–400)
RBC: 4.02 MIL/uL — ABNORMAL LOW (ref 4.22–5.81)
RDW: 14.2 % (ref 11.5–15.5)
WBC: 19.7 10*3/uL — ABNORMAL HIGH (ref 4.0–10.5)
nRBC: 0 % (ref 0.0–0.2)

## 2020-05-15 LAB — URINALYSIS, ROUTINE W REFLEX MICROSCOPIC
Bilirubin Urine: NEGATIVE
Glucose, UA: NEGATIVE mg/dL
Ketones, ur: 5 mg/dL — AB
Nitrite: NEGATIVE
Protein, ur: >=300 mg/dL — AB
RBC / HPF: >50 RBC/hpf — ABNORMAL HIGH (ref 0–5)
Specific Gravity, Urine: 1.014 (ref 1.005–1.030)
WBC, UA: >50 WBC/hpf — ABNORMAL HIGH (ref 0–5)
pH: 8 (ref 5.0–8.0)

## 2020-05-15 LAB — GLUCOSE, CAPILLARY
Glucose-Capillary: 117 mg/dL — ABNORMAL HIGH (ref 70–99)
Glucose-Capillary: 170 mg/dL — ABNORMAL HIGH (ref 70–99)
Glucose-Capillary: 230 mg/dL — ABNORMAL HIGH (ref 70–99)
Glucose-Capillary: 140 mg/dL — ABNORMAL HIGH (ref 70–99)

## 2020-05-15 NOTE — Progress Notes (Signed)
Patient is experiencing less loose thoughts and delirium and has been following simple commands.  however is weaker and and sleeping more today, patient urine became dark and bloody,Attending  Md was notified and ordered to collect an urinalysis and  increase Normal saline fluids from 8ml/hr to 27ml/hr.  Patient also experiencing lower back pain which pt stated that he takes tylenol to help relieve pain.

## 2020-05-15 NOTE — Progress Notes (Signed)
PROGRESS NOTE    Erik Morales  JIR:[id number] DOB: 09/27/1940 DOA: 05/09/2020 PCP: [name], MD    Brief Narrative:  79 y.o. male with medical history significant for hypertension, type 2 diabetes mellitus, chronic kidney disease stage IV, paroxysmal atrial fibrillation not anticoagulated due to peptic ulcer disease with history of bleeding, and now presenting to the emergency department for evaluation of confusion and a fall.  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 Acute encephalopathy  - Presents at urging of family who report that he has been more confused recently and fell 3 or 4 days ago  - Head CT is negative for acute findings, ammonia level normal, and no apparent infection  - Check TSH, B12, thiamine, and RPR, pending  - Mentation seems much improved after receiving IVF overnight -Was seen by PT 9/22 with recs for SNF at that time. As pt has shown improvement, pt was later re-evaluated by PT, recommendation remains for SNF placement.  Social worker is following for placement  2. Left leg DVT  - No evidence for PE or phlegmasia  - ED physician discussed with vascular surgeon on-call who felt that this is chronic and patient not candidate for [facility] or procedure   -Will continue to monitor for now  3. Sinus bradycardia  - HR in 40-50s in ED without chest pain or syncope, sinus rhythm confirmed on EKG  - Atenolol and amiodarone initially noted prior to admit -Atenolol held and amiodarone held.  Had earlier called and discussed with patient's primary Cardiologist, Dr. Geraldo Pitter who agrees -continue cardiac monitoring   -TSH  noted to be within normal range, reviewed  4. Paroxysmal atrial fibrillation  - In sinus rhythm on admission  - Not anticoagulated d/t hx of GIB  - Amiodarone and beta blocker now held due to bradycardia  as noted above  -Continuing to monitor on tele  5. CKD IV  - SCr is 2.53 on admission, similar to priors  - Renal function had improved over baseline since receiving IVF  6. Hypertension  - There were some low BP readings in ED and antihypertensives held on admission   - BP now trending up. -Now on low-dose Norvasc with hydralazine PRN  7. Type II DM  - A1c was 6.4% last month  -Continue low-intensity SSI for now as needed   8. Leukocytosis  - WBC is 13,700 on admission without fever or any apparent infectious process  - Urine cx reviewed and is unremarkable   9. Dehydration -Clinically improved since receiving IV fluid hydration.  We will continue gentle IV fluid hydration as tolerated  DVT prophylaxis: Heparin subq Code Status: Full Family Communication: Pt in room, family not at bedside, have been updating family over phone  Status is: Inpatient  The patient will require care spanning > 2 midnights and should be moved to inpatient because: Unsafe d/c plan, IV treatments appropriate due to intensity of illness or inability to take PO and Inpatient level of care appropriate due to severity of illness  Dispo: The patient is from: Home              Anticipated d/c  is to: SNF              Anticipated d/c date is: 2 days              Patient currently is medically stable to d/c.  Just awaiting disposition    Consultants:     Procedures:     Antimicrobials: Anti-infectives (From admission, onward)   None      Subjective: Without complaints this AM  Objective: Vitals:   05/14/20 0314 05/14/20 2107 05/15/20 0508 05/15/20 0508  BP: 140/64 102/61  132/66  Pulse: (!) 51 (!) 52  65  Resp: 15 18  20   Temp: 98.4 F (36.9 C) 98.8 F (37.1 C)  98.7 F (37.1  C)  TempSrc: Oral Oral  Oral  SpO2: 98% 96%  100%  Weight:   96.6 kg   Height:        Intake/Output Summary (Last 24 hours) at 05/15/2020 1305 Last data filed at 05/15/2020 1300 Gross per 24 hour  Intake 540 ml  Output 300 ml  Net 240 ml   Filed Weights   05/13/20 0558 05/14/20 0059 05/15/20 0508  Weight: 95.7 kg 97.5 kg 96.6 kg    Examination: General exam: Conversant, in no acute distress Respiratory system: normal chest rise, clear, no audible wheezing Cardiovascular system: regular rhythm, s1-s2 Gastrointestinal system: Nondistended, nontender, pos BS Central nervous system: No seizures, no tremors Extremities: No cyanosis, no joint deformities Skin: No rashes, no pallor Psychiatry: Affect normal // no auditory hallucinations   Data Reviewed: I have personally reviewed following labs and imaging studies  CBC: Recent Labs  Lab 05/09/20 2208 05/10/20 1644 05/12/20 0938 05/13/20 0957 05/15/20 0526  WBC 13.7*  --  12.1* 14.2* 19.7*  HGB 12.3* 11.6* 10.5* 11.5* 12.2*  HCT 38.4* 34.0* 32.9* 35.7* 37.6*  MCV 94.8  --  93.5 91.1 93.5  PLT 267  --  219 228 568   Basic Metabolic Panel: Recent Labs  Lab 05/09/20 2208 05/09/20 2208 05/10/20 1644 05/12/20 0938 05/13/20 0957 05/14/20 0541 05/15/20 0526  NA 140   < > 146* 143 141 141 142  K 4.2   < > 4.0 3.6 3.6 3.3* 4.0  CL 111  --   --  114* 112* 111 111  CO2 17*  --   --  21* 18* 22 18*  GLUCOSE 140*  --   --  152* 118* 107* 129*  BUN 47*  --   --  30* 26* 24* 29*  CREATININE 2.53*  --   --  1.84* 1.59* 1.60* 1.95*  CALCIUM 8.9  --   --  8.2* 8.5* 8.5* 8.8*  MG  --   --   --  2.1  --   --   --    < > = values in this interval not displayed.   GFR: Estimated Creatinine Clearance: 33.5 mL/min (A) (by C-G formula based on SCr of 1.95 mg/dL (H)). Liver Function Tests: Recent Labs  Lab 05/14/20 0541 05/15/20 0526  AST 26 24  ALT 23 23  ALKPHOS 74 78  BILITOT 1.0 1.1  PROT 6.0* 6.2*  ALBUMIN 3.1* 3.1*    No results for input(s): LIPASE, AMYLASE in the last 168 hours. Recent Labs  Lab 05/10/20 1635  AMMONIA 19   Coagulation Profile: No results for input(s): INR, PROTIME in the last 168 hours. Cardiac Enzymes: No results for input(s): CKTOTAL, CKMB, CKMBINDEX, TROPONINI in the last 168 hours. BNP (last 3 results) No results  for input(s): PROBNP in the last 8760 hours. HbA1C: No results for input(s): HGBA1C in the last 72 hours. CBG: Recent Labs  Lab 05/14/20 1208 05/14/20 1737 05/14/20 2108 05/15/20 0605 05/15/20 1223  GLUCAP 118* 129* 111* 117* 170*   Lipid Profile: No results for input(s): CHOL, HDL, LDLCALC, TRIG, CHOLHDL, LDLDIRECT in the last 72 hours. Thyroid Function Tests: Recent Labs    05/14/20 0541  TSH 1.430   Anemia Panel: No results for input(s): VITAMINB12, FOLATE, FERRITIN, TIBC, IRON, RETICCTPCT in the last 72 hours. Sepsis Labs: Recent Labs  Lab 05/09/20 2208  LATICACIDVEN 0.9    Recent Results (from the past 240 hour(s))  SARS Coronavirus 2 by RT PCR (hospital order, performed in [facility] hospital lab) Nasopharyngeal Nasopharyngeal Swab     Status: None   Collection Time: 05/10/20  8:17 PM   Specimen: Nasopharyngeal Swab  Result Value Ref Range Status   SARS Coronavirus 2 NEGATIVE NEGATIVE Final    Comment: (NOTE) SARS-CoV-2 target nucleic acids are NOT DETECTED.  The SARS-CoV-2 RNA is generally detectable in upper and lower respiratory specimens during the acute phase of infection. The lowest concentration of SARS-CoV-2 viral copies this assay can detect is 250 copies / mL. A negative result does not preclude SARS-CoV-2 infection and should not be used as the sole basis for treatment or other patient management decisions.  A negative result may occur with improper specimen collection / handling, submission of specimen other than nasopharyngeal swab, presence of viral mutation(s) within the areas targeted by this assay, and inadequate  number of viral copies (<250 copies / mL). A negative result must be combined with clinical observations, patient history, and epidemiological information.  Fact Sheet for Patients:   StrictlyIdeas.no  Fact Sheet for Healthcare Providers: BankingDealers.co.za  This test is not yet approved or  cleared by the [place] FDA and has been authorized for detection and/or diagnosis of SARS-CoV-2 by FDA under an Emergency Use Authorization (EUA).  This EUA will remain in effect (meaning this test can be used) for the duration of the COVID-19 declaration under Section 564(b)(1) of the Act, 21 U.S.C. section 360bbb-3(b)(1), unless the authorization is terminated or revoked sooner.  Performed at [facility], [place] [address] [place], [place] [address]   Urine culture     Status: Abnormal   Collection Time: 05/10/20 10:16 PM   Specimen: Urine, Random  Result Value Ref Range Status   Specimen Description URINE, RANDOM  Final   Special Requests NONE  Final   Culture (A)  Final    <10,000 COLONIES/mL INSIGNIFICANT GROWTH Performed at [facility], [place] [address], [place], [place] [address]    Report Status 05/11/2020 FINAL  Final     Radiology Studies: No results found.  Scheduled Meds: . amLODipine  5 mg Oral Daily  . heparin  5,000 Units Subcutaneous Q8H  . insulin aspart  0-9 Units Subcutaneous TID WC  . latanoprost  1 drop Both Eyes QHS  . montelukast  10 mg Oral QHS  . pantoprazole  20 mg Oral BID  . pravastatin  10 mg Oral q1800  . sodium chloride flush  3 mL Intravenous Q12H  . sodium chloride flush  3 mL Intravenous Q12H   Continuous Infusions: . sodium chloride    . sodium chloride 50 mL/hr at 05/15/20 1696     LOS: 3 days   Marylu Lund, MD [facility] Pager On Amion  If 7PM-7AM, please contact night-coverage 05/15/2020,  1:05 PM

## 2020-05-16 ENCOUNTER — Inpatient Hospital Stay (HOSPITAL_COMMUNITY): Payer: Medicare Other

## 2020-05-16 LAB — COMPREHENSIVE METABOLIC PANEL
ALT: 21 U/L (ref 0–44)
AST: 16 U/L (ref 15–41)
Albumin: 2.8 g/dL — ABNORMAL LOW (ref 3.5–5.0)
Alkaline Phosphatase: 75 U/L (ref 38–126)
Anion gap: 12 (ref 5–15)
BUN: 42 mg/dL — ABNORMAL HIGH (ref 8–23)
CO2: 17 mmol/L — ABNORMAL LOW (ref 22–32)
Calcium: 8.7 mg/dL — ABNORMAL LOW (ref 8.9–10.3)
Chloride: 112 mmol/L — ABNORMAL HIGH (ref 98–111)
Creatinine, Ser: 2.85 mg/dL — ABNORMAL HIGH (ref 0.61–1.24)
GFR calc Af Amer: 23 mL/min — ABNORMAL LOW (ref >60)
GFR calc non Af Amer: 20 mL/min — ABNORMAL LOW (ref >60)
Glucose, Bld: 166 mg/dL — ABNORMAL HIGH (ref 70–99)
Potassium: 3.9 mmol/L (ref 3.5–5.1)
Sodium: 141 mmol/L (ref 135–145)
Total Bilirubin: 1 mg/dL (ref 0.3–1.2)
Total Protein: 6.2 g/dL — ABNORMAL LOW (ref 6.5–8.1)

## 2020-05-16 LAB — LACTIC ACID, PLASMA
Lactic Acid, Venous: 1.7 mmol/L (ref 0.5–1.9)
Lactic Acid, Venous: 1.8 mmol/L (ref 0.5–1.9)

## 2020-05-16 LAB — GLUCOSE, CAPILLARY
Glucose-Capillary: 160 mg/dL — ABNORMAL HIGH (ref 70–99)
Glucose-Capillary: 163 mg/dL — ABNORMAL HIGH (ref 70–99)
Glucose-Capillary: 185 mg/dL — ABNORMAL HIGH (ref 70–99)
Glucose-Capillary: 147 mg/dL — ABNORMAL HIGH (ref 70–99)

## 2020-05-16 LAB — CBC
HCT: 37.4 % — ABNORMAL LOW (ref 39.0–52.0)
Hemoglobin: 12.1 g/dL — ABNORMAL LOW (ref 13.0–17.0)
MCH: 30.4 pg (ref 26.0–34.0)
MCHC: 32.4 g/dL (ref 30.0–36.0)
MCV: 94 fL (ref 80.0–100.0)
Platelets: 251 10*3/uL (ref 150–400)
RBC: 3.98 MIL/uL — ABNORMAL LOW (ref 4.22–5.81)
RDW: 14.6 % (ref 11.5–15.5)
WBC: 32.6 10*3/uL — ABNORMAL HIGH (ref 4.0–10.5)
nRBC: 0 % (ref 0.0–0.2)

## 2020-05-16 LAB — PROTIME-INR
INR: 1.2 (ref 0.8–1.2)
Prothrombin Time: 14.8 seconds (ref 11.4–15.2)

## 2020-05-16 LAB — APTT: aPTT: 34 seconds (ref 24–36)

## 2020-05-16 MED ORDER — SODIUM CHLORIDE 0.9 % IV SOLN
1.0000 g | INTRAVENOUS | Status: DC
  Administered 2020-05-16 – 2020-05-20 (×5): 1 g via INTRAVENOUS
  Filled 2020-05-16 (×6): qty 10

## 2020-05-16 MED ORDER — IOHEXOL 9 MG/ML PO SOLN
500.0000 mL | ORAL | Status: AC
  Administered 2020-05-16: 500 mL via ORAL

## 2020-05-16 MED ORDER — IOHEXOL 9 MG/ML PO SOLN
ORAL | Status: AC
  Filled 2020-05-16: qty 1000

## 2020-05-16 NOTE — Progress Notes (Signed)
Notified bedside nurse of need to draw lactic acid and blood cultures. Need every hour BP.Marland Kitchen

## 2020-05-16 NOTE — Progress Notes (Signed)
Phone call to bedside RN for stat lactate and blood cultures, need every hour BP.

## 2020-05-16 NOTE — Progress Notes (Signed)
Notified bedside nurse of need to draw lactic acid and blood cultures.  

## 2020-05-16 NOTE — Progress Notes (Signed)
Physical Therapy Treatment Patient Details Name: Erik Morales MRN: [id number] DOB: 22-Oct-1940 Today's Date: 05/16/2020    History of Present Illness Pt is an 79 y.o. male admitted 05/09/20 with confusion and fall. Found to be bradycardic and hypotensive, also with chronic LLE DVT. Workup for acute encephalopathy, PAF, bradycardia. Head CT negative for acute injury. PMH includes HTN, DM2, CKD IV, PAF.    PT Comments    Pt admitted with above diagnosis. Pt was much weaker today and needed incr cues and assist for mobility. Also pt with BM and spent time cleaning him. Pt transfer to chair only using Stedy today due to weakness.    Pt currently with functional limitations due to balance and endurance deficits. Pt will benefit from skilled PT to increase their independence and safety with mobility to allow discharge to the venue listed below.     Follow Up Recommendations  SNF;Supervision for mobility/OOB     Equipment Recommendations   (defer)    Recommendations for Other Services OT consult     Precautions / Restrictions Precautions Precautions: Fall Restrictions Weight Bearing Restrictions: No    Mobility  Bed Mobility Overal bed mobility: Needs Assistance Bed Mobility: Supine to Sit     Supine to sit: Mod assist     General bed mobility comments: Needed mod assist of 2 and took increased time and effort  Transfers Overall transfer level: Needs assistance   Transfers: Sit to/from Stand Sit to Stand: Mod assist Stand pivot transfers: Total assist;+2 safety/equipment       General transfer comment: Reliant on momentum to power into standing, modA for stability, cues for hand placement.  Used Stedy as pt much weaker today.  Pt also had BM and had to be cleaned. Once up to [facility], able to stand to be cleaned.   Ambulation/Gait             General Gait Details: unable to walk today.    Stairs             Wheelchair Mobility    Modified Rankin (Stroke  Patients Only)       Balance Overall balance assessment: Needs assistance Sitting-balance support: Bilateral upper extremity supported Sitting balance-Leahy Scale: Poor Sitting balance - Comments: Pt needed UE support and mod assist to sit at EOB. Pt with posterior lean today.     Standing balance support: Bilateral upper extremity supported Standing balance-Leahy Scale: Poor Standing balance comment: Can static stand with UE support on STedy                            Cognition Arousal/Alertness: Awake/alert Behavior During Therapy: Anxious;Flat affect Overall Cognitive Status: Impaired/Different from baseline Area of Impairment: Orientation;Memory;Safety/judgement;Problem solving                 Orientation Level: Disoriented to;Situation;Place;Time Current Attention Level: Selective Memory: Decreased short-term memory Following Commands: Follows one step commands consistently;Follows one step commands with increased time;Follows multi-step commands inconsistently Safety/Judgement: Decreased awareness of deficits;Decreased awareness of safety Awareness: Intellectual;Emergent Problem Solving: Decreased initiation;Requires verbal cues;Requires tactile cues;Slow processing General Comments: More confused this date and requiring increased assist for mobility.      Exercises General Exercises - Lower Extremity Ankle Circles/Pumps: AROM;Both;5 reps;Supine Long Arc Quad: AROM;Both;10 reps;Seated    General Comments        Pertinent Vitals/Pain Pain Assessment: Faces Faces Pain Scale: Hurts little more Pain Location: low back Pain Descriptors / Indicators:  Grimacing;Guarding Pain Intervention(s): Limited activity within patient's tolerance;Monitored during session;Repositioned    Home Living                      Prior Function            PT Goals (current goals can now be found in the care plan section) Progress towards PT goals: Progressing  toward goals    Frequency    Min 2X/week      PT Plan Current plan remains appropriate    Co-evaluation              AM-PAC PT "6 Clicks" Mobility   Outcome Measure  Help needed turning from your back to your side while in a flat bed without using bedrails?: None Help needed moving from lying on your back to sitting on the side of a flat bed without using bedrails?: A Little Help needed moving to and from a bed to a chair (including a wheelchair)?: A Lot Help needed standing up from a chair using your arms (e.g., wheelchair or bedside chair)?: A Lot Help needed to walk in hospital room?: Total Help needed climbing 3-5 steps with a railing? : Total 6 Click Score: 13    End of Session Equipment Utilized During Treatment: Gait belt Activity Tolerance: Patient limited by fatigue Patient left: in chair;with call bell/phone within reach;with chair alarm set Nurse Communication: Mobility status PT Visit Diagnosis: Muscle weakness (generalized) (M62.81);Pain;History of falling (Z91.81);Difficulty in walking, not elsewhere classified (R26.2) Pain - Right/Left: Left Pain - part of body: Leg (generalized)     Time: 0175-1025 PT Time Calculation (min) (ACUTE ONLY): 21 min  Charges:  $Therapeutic Activity: 8-22 mins                     Dillon Mcreynolds W,PT Acute Rehabilitation Services Pager:  [phone]  Office:  [place] 05/16/2020, 1:36 PM

## 2020-05-16 NOTE — Progress Notes (Signed)
Lactic acid pulled for labs at this time

## 2020-05-16 NOTE — Progress Notes (Signed)
Unable to get all blood needed for sepsis labs, to send another lab tech to draw, per RN MD is aware

## 2020-05-17 LAB — COMPREHENSIVE METABOLIC PANEL
ALT: 18 U/L (ref 0–44)
AST: 18 U/L (ref 15–41)
Albumin: 2.4 g/dL — ABNORMAL LOW (ref 3.5–5.0)
Alkaline Phosphatase: 70 U/L (ref 38–126)
Anion gap: 13 (ref 5–15)
BUN: 56 mg/dL — ABNORMAL HIGH (ref 8–23)
CO2: 14 mmol/L — ABNORMAL LOW (ref 22–32)
Calcium: 8.3 mg/dL — ABNORMAL LOW (ref 8.9–10.3)
Chloride: 112 mmol/L — ABNORMAL HIGH (ref 98–111)
Creatinine, Ser: 3.36 mg/dL — ABNORMAL HIGH (ref 0.61–1.24)
GFR calc Af Amer: 19 mL/min — ABNORMAL LOW (ref >60)
GFR calc non Af Amer: 16 mL/min — ABNORMAL LOW (ref >60)
Glucose, Bld: 144 mg/dL — ABNORMAL HIGH (ref 70–99)
Potassium: 3.7 mmol/L (ref 3.5–5.1)
Sodium: 139 mmol/L (ref 135–145)
Total Bilirubin: 0.7 mg/dL (ref 0.3–1.2)
Total Protein: 5.5 g/dL — ABNORMAL LOW (ref 6.5–8.1)

## 2020-05-17 LAB — CBC
HCT: 34.5 % — ABNORMAL LOW (ref 39.0–52.0)
Hemoglobin: 11.2 g/dL — ABNORMAL LOW (ref 13.0–17.0)
MCH: 30.6 pg (ref 26.0–34.0)
MCHC: 32.5 g/dL (ref 30.0–36.0)
MCV: 94.3 fL (ref 80.0–100.0)
Platelets: 247 10*3/uL (ref 150–400)
RBC: 3.66 MIL/uL — ABNORMAL LOW (ref 4.22–5.81)
RDW: 15 % (ref 11.5–15.5)
WBC: 29.9 10*3/uL — ABNORMAL HIGH (ref 4.0–10.5)
nRBC: 0 % (ref 0.0–0.2)

## 2020-05-17 LAB — MAGNESIUM: Magnesium: 2 mg/dL (ref 1.7–2.4)

## 2020-05-17 LAB — GLUCOSE, CAPILLARY
Glucose-Capillary: 132 mg/dL — ABNORMAL HIGH (ref 70–99)
Glucose-Capillary: 205 mg/dL — ABNORMAL HIGH (ref 70–99)
Glucose-Capillary: 121 mg/dL — ABNORMAL HIGH (ref 70–99)
Glucose-Capillary: 152 mg/dL — ABNORMAL HIGH (ref 70–99)

## 2020-05-17 MED ORDER — CHLORHEXIDINE GLUCONATE CLOTH 2 % EX PADS
6.0000 | MEDICATED_PAD | Freq: Every day | CUTANEOUS | Status: DC
  Administered 2020-05-17 – 2020-05-20 (×3): 6 via TOPICAL

## 2020-05-17 MED ORDER — LACTATED RINGERS IV SOLN: INTRAVENOUS | Status: DC

## 2020-05-17 MED ORDER — SODIUM BICARBONATE 8.4 % IV SOLN
INTRAVENOUS | Status: DC
  Filled 2020-05-17: qty 50

## 2020-05-17 NOTE — Progress Notes (Signed)
PROGRESS NOTE    Erik Morales  HYQ:[id number] DOB: 12/23/1940 DOA: 05/09/2020 PCP: [name], MD    Brief Narrative:  79 y.o. male with medical history significant for hypertension, type 2 diabetes mellitus, chronic kidney disease stage IV, paroxysmal atrial fibrillation not anticoagulated due to peptic ulcer disease with history of bleeding, and now presenting to the emergency department for evaluation of confusion and a fall.  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 Acute toxic metabolic encephalopathy  -Suspect secondary to UTI per below - Presents at urging of family who report that he has been more confused recently and fell 3 or 4 days ago  - Head CT is negative for acute findings, ammonia level normal, and no apparent infection  - Check TSH, B12, thiamine, and RPR, pending  - Mentation seems much improved after receiving IVF overnight -Was seen by PT 9/22 with recs for SNF at that time. As pt has shown improvement, pt was later re-evaluated by PT, recommendation remains for SNF placement.  Social worker is following for placement  2. Left leg DVT  - No evidence for PE or phlegmasia  - ED physician discussed with vascular surgeon on-call who felt that this is chronic and patient not candidate for [facility] or procedure   -Will continue to monitor for now  3. Sinus bradycardia  - HR in 40-50s in ED without chest pain or syncope, sinus rhythm confirmed on EKG  - Atenolol and amiodarone initially noted prior to admit -Atenolol held and amiodarone held.  Had earlier called and discussed with patient's primary Cardiologist, Dr.  Geraldo Pitter who agrees -continue cardiac monitoring   -TSH noted to be within normal range, reviewed  4. Paroxysmal atrial fibrillation  - In sinus rhythm on admission  - Not anticoagulated d/t hx of GIB  - Amiodarone and beta blocker now held due to bradycardia  as noted above  -Continuing to monitor on tele - When HR and bp can tolerate, consider resuming BB alone  5. CKD IV  - SCr is 2.53 on admission, similar to priors  - Cr briefly improved with IVF, however over past several days, Cr progressively trended up to peak of 3.36 today - CT abd personally reviewed. Evidence of enlarged prostate with B hydronephrosis and distended bladder - Foley cath placed with ample urine output thus far. Will continue indwelling foley for now - Will likely need to f/u closely with Urology on d/c - Will repeat bmet in AM. If there is no significant change in renal function despite IVF and catheter, would formally consult Nephrology at that time  6. Hypertension  - There were some low BP readings in ED and antihypertensives held on admission   - BP currently stable, albeit soft -Had been on low-dose Norvasc with hydralazine PRN. Given soft BP today, will hold norvasc -Per above, as bp and HR tolerates, would consider resuming beta blocker alone  7. Type II DM  - A1c was 6.4% last month  -Continue low-intensity SSI for now as needed   8. Sepsis with UTI present on admission - WBC is 13,700 on admission without fever or any apparent infectious  process initially  - Initial urine cx reviewed and was unremarkable  - Over the weekend, WBC trended up to a peak of 32.6 on 9/27 - Repeat urine cx now pos for >100,000 gm neg rods - Will continue on empiric rocephin, pending culture sensitivities - Repeat CBC in AM. Thus far, WBC seems to be trending down  9. Dehydration -Clinically improved since receiving IV fluid hydration.  We will continue IV fluid hydration as tolerated  10. Hematuria - noted on  recent UA - Renal US reviewed, findings suggestive of a 3cm bladder wall thickening - CT abd/pelvis without contrast reviewed with no significant bladder wall thickening noted - Will continue abx for UTI per above, continue indwelling cath -Would have pt f/u with Urology on d/c  DVT prophylaxis: Heparin subq Code Status: Full Family Communication: Pt in room, family not at bedside, updated family over phone  Status is: Inpatient  The patient will require care spanning > 2 midnights and should be moved to inpatient because: Unsafe d/c plan, IV treatments appropriate due to intensity of illness or inability to take PO and Inpatient level of care appropriate due to severity of illness  Dispo: The patient is from: Home              Anticipated d/c is to: SNF              Anticipated d/c date is: 2 days              Patient currently is not medically stable to d/c.      Consultants:     Procedures:     Antimicrobials: Anti-infectives (From admission, onward)   Start     Dose/Rate Route Frequency Ordered Stop   05/16/20 0830  cefTRIAXone (ROCEPHIN) 1 g in sodium chloride 0.9 % 100 mL IVPB        1 g 200 mL/hr over 30 Minutes Intravenous Every 24 hours 05/16/20 0744        Subjective: No complaints noted this AM  Objective: Vitals:   05/17/20 0531 05/17/20 0608 05/17/20 1026 05/17/20 1206  BP:  102/72 (!) 102/56 (!) 114/45  Pulse:  63 (!) 58 (!) 46  Resp:  20  18  Temp:  99.7 F (37.6 C)  97.9 F (36.6 C)  TempSrc:  Oral  Oral  SpO2:  98%  96%  Weight: 95.7 kg     Height:        Intake/Output Summary (Last 24 hours) at 05/17/2020 1623 Last data filed at 05/17/2020 1022 Gross per 24 hour  Intake 2021.07 ml  Output 2000 ml  Net 21.07 ml   Filed Weights   05/15/20 0508 05/16/20 0040 05/17/20 0531  Weight: 96.6 kg 93.9 kg 95.7 kg    Examination: General exam: Awake, laying in bed, in nad Respiratory system: Normal respiratory effort, no  wheezing Cardiovascular system: regular rate, s1, s2 Gastrointestinal system: Soft, nondistended, positive BS Central nervous system: CN2-12 grossly intact, strength intact Extremities: Perfused, no clubbing Skin: Normal skin turgor, no notable skin lesions seen Psychiatry: Mood normal // no visual hallucinations   Data Reviewed: I have personally reviewed following labs and imaging studies  CBC: Recent Labs  Lab 05/12/20 0938 05/13/20 0957 05/15/20 0526 05/16/20 0433 05/17/20 0417  WBC 12.1* 14.2* 19.7* 32.6* 29.9*  HGB 10.5* 11.5* 12.2* 12.1* 11.2*  HCT 32.9* 35.7* 37.6* 37.4* 34.5*  MCV 93.5 91.1 93.5 94.0 94.3  PLT 219 228 253 251 267   Basic Metabolic  Panel: Recent Labs  Lab 05/12/20 0938 05/12/20 2956 05/13/20 0957 05/14/20 0541 05/15/20 0526 05/16/20 0433 05/17/20 0417  NA 143   < > 141 141 142 141 139  K 3.6   < > 3.6 3.3* 4.0 3.9 3.7  CL 114*   < > 112* 111 111 112* 112*  CO2 21*   < > 18* 22 18* 17* 14*  GLUCOSE 152*   < > 118* 107* 129* 166* 144*  BUN 30*   < > 26* 24* 29* 42* 56*  CREATININE 1.84*   < > 1.59* 1.60* 1.95* 2.85* 3.36*  CALCIUM 8.2*   < > 8.5* 8.5* 8.8* 8.7* 8.3*  MG 2.1  --   --   --   --   --  2.0   < > = values in this interval not displayed.   GFR: Estimated Creatinine Clearance: 19.3 mL/min (A) (by C-G formula based on SCr of 3.36 mg/dL (H)). Liver Function Tests: Recent Labs  Lab 05/14/20 0541 05/15/20 0526 05/16/20 0433 05/17/20 0417  AST 26 24 16 18   ALT 23 23 21 18   ALKPHOS 74 78 75 70  BILITOT 1.0 1.1 1.0 0.7  PROT 6.0* 6.2* 6.2* 5.5*  ALBUMIN 3.1* 3.1* 2.8* 2.4*   No results for input(s): LIPASE, AMYLASE in the last 168 hours. Recent Labs  Lab 05/10/20 1635  AMMONIA 19   Coagulation Profile: Recent Labs  Lab 05/16/20 1158  INR 1.2   Cardiac Enzymes: No results for input(s): CKTOTAL, CKMB, CKMBINDEX, TROPONINI in the last 168 hours. BNP (last 3 results) No results for input(s): PROBNP in the last 8760  hours. HbA1C: No results for input(s): HGBA1C in the last 72 hours. CBG: Recent Labs  Lab 05/16/20 1250 05/16/20 1608 05/16/20 2115 05/17/20 0515 05/17/20 1203  GLUCAP 163* 185* 147* 132* 205*   Lipid Profile: No results for input(s): CHOL, HDL, LDLCALC, TRIG, CHOLHDL, LDLDIRECT in the last 72 hours. Thyroid Function Tests: No results for input(s): TSH, T4TOTAL, FREET4, T3FREE, THYROIDAB in the last 72 hours. Anemia Panel: No results for input(s): VITAMINB12, FOLATE, FERRITIN, TIBC, IRON, RETICCTPCT in the last 72 hours. Sepsis Labs: Recent Labs  Lab 05/16/20 1158 05/16/20 1342  LATICACIDVEN 1.7 1.8    Recent Results (from the past 240 hour(s))  SARS Coronavirus 2 by RT PCR (hospital order, performed in [facility] hospital lab) Nasopharyngeal Nasopharyngeal Swab     Status: None   Collection Time: 05/10/20  8:17 PM   Specimen: Nasopharyngeal Swab  Result Value Ref Range Status   SARS Coronavirus 2 NEGATIVE NEGATIVE Final    Comment: (NOTE) SARS-CoV-2 target nucleic acids are NOT DETECTED.  The SARS-CoV-2 RNA is generally detectable in upper and lower respiratory specimens during the acute phase of infection. The lowest concentration of SARS-CoV-2 viral copies this assay can detect is 250 copies / mL. A negative result does not preclude SARS-CoV-2 infection and should not be used as the sole basis for treatment or other patient management decisions.  A negative result may occur with improper specimen collection / handling, submission of specimen other than nasopharyngeal swab, presence of viral mutation(s) within the areas targeted by this assay, and inadequate number of viral copies (<250 copies / mL). A negative result must be combined with clinical observations, patient history, and epidemiological information.  Fact Sheet for Patients:   StrictlyIdeas.no  Fact Sheet for Healthcare  Providers: BankingDealers.co.za  This test is not yet approved or  cleared by the [place] FDA and  has been authorized for detection and/or diagnosis of SARS-CoV-2 by FDA under an Emergency Use Authorization (EUA).  This EUA will remain in effect (meaning this test can be used) for the duration of the COVID-19 declaration under Section 564(b)(1) of the Act, 21 U.S.C. section 360bbb-3(b)(1), unless the authorization is terminated or revoked sooner.  Performed at [facility], [place] [address] [place], [place] [address]   Urine culture     Status: Abnormal   Collection Time: 05/10/20 10:16 PM   Specimen: Urine, Random  Result Value Ref Range Status   Specimen Description URINE, RANDOM  Final   Special Requests NONE  Final   Culture (A)  Final    <10,000 COLONIES/mL INSIGNIFICANT GROWTH Performed at [facility], [place] [address] [place], [place] [address]    Report Status 05/11/2020 FINAL  Final  Culture, blood (x 2)     Status: None (Preliminary result)   Collection Time: 05/16/20 11:00 AM   Specimen: BLOOD RIGHT HAND  Result Value Ref Range Status   Specimen Description BLOOD RIGHT HAND  Final   Special Requests   Final    BOTTLES DRAWN AEROBIC ONLY Blood Culture results may not be optimal due to an inadequate volume of blood received in culture bottles   Culture   Final    NO GROWTH < 24 HOURS Performed at [facility], [place] [address] [place], [place] [address]    Report Status PENDING  Incomplete  Culture, blood (x 2)     Status: None (Preliminary result)   Collection Time: 05/16/20 11:58 AM   Specimen: BLOOD RIGHT HAND  Result Value Ref Range Status   Specimen Description BLOOD RIGHT HAND  Final   Special Requests   Final    BOTTLES DRAWN AEROBIC ONLY Blood Culture adequate volume   Culture   Final    NO GROWTH < 24 HOURS Performed at [facility], [place] [address], [place], [place] [address]    Report Status PENDING   Incomplete  Culture, Urine     Status: Abnormal (Preliminary result)   Collection Time: 05/16/20 12:23 PM   Specimen: Urine, Random  Result Value Ref Range Status   Specimen Description URINE, RANDOM  Final   Special Requests NONE  Final   Culture (A)  Final    >=100,000 COLONIES/mL PROTEUS MIRABILIS SUSCEPTIBILITIES TO FOLLOW Performed at [facility], [place] [address], [place], [place] [address]    Report Status PENDING  Incomplete     Radiology Studies: CT ABDOMEN PELVIS WO CONTRAST  Result Date: 05/16/2020 CLINICAL DATA:  Hematuria of unknown cause.  Acute renal failure. EXAM: CT ABDOMEN AND PELVIS WITHOUT CONTRAST TECHNIQUE: Multidetector CT imaging of the abdomen and pelvis was performed following the standard protocol without IV contrast. COMPARISON:  None. FINDINGS: Lower chest: Small bilateral pleural effusions. Moderate-sized esophageal hiatal hernia. Hepatobiliary: Homogeneous appearance of the liver. The gallbladder is distended without stone or wall thickening. No bile duct dilatation. Pancreas: Unremarkable. No pancreatic ductal dilatation or surrounding inflammatory changes. Spleen: Normal in size without focal abnormality. Adrenals/Urinary Tract: Left adrenal gland nodule measuring 2.2 cm diameter. Density measurements are -5 Hounsfield units consistent with a benign fat containing adenoma. Bilateral renal parenchymal thinning. Mild hydronephrosis and hydroureter bilaterally. No obstructing stones are demonstrated. There is infiltration in the retroperitoneal and pelvic fat with mild medial deviation of the ureters. This may indicate retroperitoneal fibrosis. Alternatively, the ureters and collecting systems could be dilated due  to reflux from the bladder which is prominently distended. No stone or filling defect in the bladder. No significant bladder wall thickening. Stomach/Bowel: Stomach and small bowel are decompressed. Stool-filled colon without abnormal distention. The  appendix is normal. Vascular/Lymphatic: Aortic atherosclerosis. No enlarged abdominal or pelvic lymph nodes. Reproductive: Prostate gland is enlarged, measuring 5.1 cm diameter. Other: No free air or free fluid in the abdomen. Edema in the subcutaneous fat. Abdominal wall musculature appears intact. Musculoskeletal: Degenerative changes in the spine. No destructive bone lesions. IMPRESSION: 1. Small bilateral pleural effusions. 2. 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 4. Mild hydronephrosis and hydroureter bilaterally. No obstructing stones are demonstrated. There is infiltration in the retroperitoneal and pelvic fat with mild medial deviation of the ureters. This may indicate retroperitoneal fibrosis. Alternatively, the ureters and collecting systems could be dilated due to reflux from the bladder which is prominently distended. 5. Enlarged prostate gland. 6. Aortic atherosclerosis. 7. Edema in the subcutaneous fat. Aortic Atherosclerosis (ICD10-I70.0). Electronically Signed   By: Lucienne Capers M.D.   On: 05/16/2020 23:09   US RENAL  Result Date: 05/16/2020 CLINICAL DATA:  acute renal failure. EXAM: RENAL / URINARY TRACT ULTRASOUND COMPLETE COMPARISON:  09/26/2018 FINDINGS: Right Kidney: Renal measurements: 9.2 x5.0 x 4.8 cm = volume: 114.5 mL. Diffuse cortical thinning. Echogenicity within normal limits. No mass or hydronephrosis visualized. Left Kidney: Renal measurements: 8.9 x 5.1 by 4.7 cm = volume: 112.6 mL. Diffuse cortical thinning. Echogenicity within normal limits. No mass or hydronephrosis visualized. Bladder: There is a focal area of asymmetric wall thickening involving the right posterior bladder base. This measures 3.2 cm. On the color Doppler images there is only mild peripheral blood flow noted associated with this area. Other: None. IMPRESSION: 1. No hydronephrosis identified. 2. Bilateral renal cortical thinning. 3. Focal asymmetric wall thickening  involving the right posterior bladder base. Cannot rule out bladder neoplasm. Recommend further evaluation with hematuria protocol CT of the abdomen and pelvis without and with contrast material versus direct visualization with cystoscopy. Electronically Signed   By: Kerby Moors M.D.   On: 05/16/2020 11:15    Scheduled Meds: . amLODipine  5 mg Oral Daily  . Chlorhexidine Gluconate Cloth  6 each Topical Daily  . insulin aspart  0-9 Units Subcutaneous TID WC  . latanoprost  1 drop Both Eyes QHS  . montelukast  10 mg Oral QHS  . pantoprazole  20 mg Oral BID  . pravastatin  10 mg Oral q1800  . sodium chloride flush  3 mL Intravenous Q12H  . sodium chloride flush  3 mL Intravenous Q12H   Continuous Infusions: . sodium chloride    . cefTRIAXone (ROCEPHIN)  IV 1 g (05/17/20 0830)  . lactated ringers 100 mL/hr at 05/17/20 4174     LOS: 5 days   Marylu Lund, MD [facility] Pager On Amion  If 7PM-7AM, please contact night-coverage 05/17/2020, 4:23 PM

## 2020-05-18 ENCOUNTER — Inpatient Hospital Stay (HOSPITAL_COMMUNITY): Payer: Medicare Other

## 2020-05-18 DIAGNOSIS — I4891 Unspecified atrial fibrillation: Secondary | ICD-10-CM

## 2020-05-18 LAB — CBC
HCT: 31.5 % — ABNORMAL LOW (ref 39.0–52.0)
Hemoglobin: 10.1 g/dL — ABNORMAL LOW (ref 13.0–17.0)
MCH: 30.1 pg (ref 26.0–34.0)
MCHC: 32.1 g/dL (ref 30.0–36.0)
MCV: 93.8 fL (ref 80.0–100.0)
Platelets: 243 10*3/uL (ref 150–400)
RBC: 3.36 MIL/uL — ABNORMAL LOW (ref 4.22–5.81)
RDW: 14.7 % (ref 11.5–15.5)
WBC: 19.2 10*3/uL — ABNORMAL HIGH (ref 4.0–10.5)
nRBC: 0 % (ref 0.0–0.2)

## 2020-05-18 LAB — URINE CULTURE: Culture: >=100000 — AB

## 2020-05-18 LAB — GLUCOSE, CAPILLARY
Glucose-Capillary: 128 mg/dL — ABNORMAL HIGH (ref 70–99)
Glucose-Capillary: 149 mg/dL — ABNORMAL HIGH (ref 70–99)
Glucose-Capillary: 82 mg/dL (ref 70–99)
Glucose-Capillary: 130 mg/dL — ABNORMAL HIGH (ref 70–99)

## 2020-05-18 LAB — COMPREHENSIVE METABOLIC PANEL
ALT: 16 U/L (ref 0–44)
AST: 16 U/L (ref 15–41)
Albumin: 2.2 g/dL — ABNORMAL LOW (ref 3.5–5.0)
Alkaline Phosphatase: 71 U/L (ref 38–126)
Anion gap: 9 (ref 5–15)
BUN: 42 mg/dL — ABNORMAL HIGH (ref 8–23)
CO2: 20 mmol/L — ABNORMAL LOW (ref 22–32)
Calcium: 8.2 mg/dL — ABNORMAL LOW (ref 8.9–10.3)
Chloride: 112 mmol/L — ABNORMAL HIGH (ref 98–111)
Creatinine, Ser: 2.24 mg/dL — ABNORMAL HIGH (ref 0.61–1.24)
GFR calc Af Amer: 31 mL/min — ABNORMAL LOW (ref >60)
GFR calc non Af Amer: 27 mL/min — ABNORMAL LOW (ref >60)
Glucose, Bld: 105 mg/dL — ABNORMAL HIGH (ref 70–99)
Potassium: 3.2 mmol/L — ABNORMAL LOW (ref 3.5–5.1)
Sodium: 141 mmol/L (ref 135–145)
Total Bilirubin: 0.4 mg/dL (ref 0.3–1.2)
Total Protein: 5.2 g/dL — ABNORMAL LOW (ref 6.5–8.1)

## 2020-05-18 LAB — ECHOCARDIOGRAM COMPLETE
Area-P 1/2: 2.66 cm2
Height: 68 in
S' Lateral: 2.86 cm
Weight: 3393.32 oz

## 2020-05-18 MED ORDER — FINASTERIDE 5 MG PO TABS
5.0000 mg | ORAL_TABLET | Freq: Every day | ORAL | Status: DC
  Administered 2020-05-18 – 2020-05-20 (×3): 5 mg via ORAL
  Filled 2020-05-18 (×3): qty 1

## 2020-05-18 MED ORDER — PERFLUTREN LIPID MICROSPHERE
1.0000 mL | INTRAVENOUS | Status: AC | PRN
  Administered 2020-05-18: 2 mL via INTRAVENOUS
  Filled 2020-05-18: qty 10

## 2020-05-18 MED ORDER — POTASSIUM CHLORIDE 20 MEQ PO PACK
40.0000 meq | PACK | Freq: Once | ORAL | Status: AC
  Administered 2020-05-18: 40 meq via ORAL
  Filled 2020-05-18: qty 2

## 2020-05-18 MED ORDER — TAMSULOSIN HCL 0.4 MG PO CAPS
0.4000 mg | ORAL_CAPSULE | Freq: Every day | ORAL | Status: DC
  Administered 2020-05-18 – 2020-05-20 (×3): 0.4 mg via ORAL
  Filled 2020-05-18 (×3): qty 1

## 2020-05-18 NOTE — Progress Notes (Signed)
PT Cancellation Note  Patient Details Name: Erik Morales MRN: [id number] DOB: 06-16-1941   Cancelled Treatment:     pt out of room at echo; will attempt later, time permitting  [name], DPT Acute Rehabilitation Services [phone]   [name] 05/18/2020, 11:25 AM

## 2020-05-18 NOTE — Progress Notes (Signed)
Physical Therapy Treatment Patient Details Name: Erik Morales MRN: [id number] DOB: 09-09-1940 Today's Date: 05/18/2020    History of Present Illness Pt is an 79 y.o. male admitted 05/09/20 with confusion and fall. Found to be bradycardic and hypotensive, also with chronic LLE DVT. Workup for acute encephalopathy, PAF, bradycardia. Head CT negative for acute injury. PMH includes HTN, DM2, CKD IV, PAF.    PT Comments    Pt was agreeable to repositioning to improve alignment in bed; pt states he is too tired to do more because of all the tests he has had today. Pt able to participate in rolling but was unable to assist with pushing himself up in the bed, PT assisted with chuckpad; pt continues to demonstrate deficits in strength, endurance, bed mobility, transfers and safety; pt will benefit from skilled PT to address deficits and maximize independence with functional mobility prior to discharge.     Follow Up Recommendations  SNF;Supervision for mobility/OOB     Equipment Recommendations       Recommendations for Other Services      Precautions / Restrictions Precautions Precautions: Fall Restrictions Weight Bearing Restrictions: No    Mobility  Bed Mobility Overal bed mobility: Needs Assistance Bed Mobility: Rolling Rolling: Min assist      General bed mobility comments: performed rolling R and L multiple times for therapist to assist with scooting up in the bed; pt requiring use of bedrails and min A to maintain roll; attempted to bridge to scoot up in bed but pt unable to assist; pt requiring min A to bring obtain hooklying position to assist with rolling and scooting up in the bed  Transfers                    Ambulation/Gait                 Stairs             Wheelchair Mobility    Modified Rankin (Stroke Patients Only)       Balance     Sitting balance-Leahy Scale: Poor                                      Cognition  Arousal/Alertness: Awake/alert Behavior During Therapy: WFL for tasks assessed/performed Overall Cognitive Status: Impaired/Different from baseline Area of Impairment: Orientation;Memory;Safety/judgement                 Orientation Level: Situation       Safety/Judgement: Decreased awareness of deficits;Decreased awareness of safety            Exercises      General Comments        Pertinent Vitals/Pain Pain Assessment: No/denies pain    Home Living                      Prior Function            PT Goals (current goals can now be found in the care plan section) Acute Rehab PT Goals Patient Stated Goal: Patient unable to articulate PT Goal Formulation: With patient/family Potential to Achieve Goals: Good Progress towards PT goals: Progressing toward goals;Not progressing toward goals - comment (limited sesson due to overall fatigue; pt states has had multiple tests and is tired, agreeable to repositioning in bed)    Frequency    Min 2X/week  PT Plan Current plan remains appropriate    Co-evaluation              AM-PAC PT "6 Clicks" Mobility   Outcome Measure  Help needed turning from your back to your side while in a flat bed without using bedrails?: A Little Help needed moving from lying on your back to sitting on the side of a flat bed without using bedrails?: A Little Help needed moving to and from a bed to a chair (including a wheelchair)?: A Lot Help needed standing up from a chair using your arms (e.g., wheelchair or bedside chair)?: A Lot Help needed to walk in hospital room?: Total Help needed climbing 3-5 steps with a railing? : Total 6 Click Score: 12    End of Session   Activity Tolerance: Patient limited by fatigue Patient left: in bed;with bed alarm set;with call bell/phone within reach Nurse Communication: Mobility status PT Visit Diagnosis: Muscle weakness (generalized) (M62.81);Pain;History of falling  (Z91.81);Difficulty in walking, not elsewhere classified (R26.2)     Time: 5056-9794 PT Time Calculation (min) (ACUTE ONLY): 11 min  Charges:  $Therapeutic Activity: 8-22 mins                     [name], DPT Acute Rehabilitation Services [phone]   [name] 05/18/2020, 2:32 PM

## 2020-05-18 NOTE — Progress Notes (Signed)
  Echocardiogram 2D Echocardiogram has been performed.  Jennette Dubin 05/18/2020, 11:41 AM

## 2020-05-18 NOTE — Progress Notes (Signed)
PROGRESS NOTE    RALPH BROUWER  GGE:[id number]  DOB: January 22, 1941  DOA: 05/09/2020 PCP: [name], MD Outpatient Specialists:   Hospital course:  79 year old man with HTN, DM 2, CKD 4, PAF not on anticoagulation due to PUD, chronic DVTs was admitted 05/09/2020 with acute metabolic encephalopathy secondary to urinary tract infection.  Patient has been treated with Rocephin.  Course has been complicated by increasing kidney dysfunction and work-up revealed bilateral hydronephrosis and distended bladder.  Foley was placed.  Subjective: Patient without any complaints.  Denies being confused.  Notes he is doing well.   Objective: Vitals:   05/17/20 1206 05/17/20 2028 05/18/20 0350 05/18/20 1150  BP: (!) 114/45 (!) 100/45 (!) 115/47 (!) 122/50  Pulse: (!) 46 (!) 53 (!) 52 (!) 50  Resp: 18 18 18 18   Temp: 97.9 F (36.6 C) 98 F (36.7 C) 98.2 F (36.8 C) 98.1 F (36.7 C)  TempSrc: Oral Oral Oral Oral  SpO2: 96%  98% 98%  Weight:   96.2 kg   Height:        Intake/Output Summary (Last 24 hours) at 05/18/2020 1603 Last data filed at 05/18/2020 1317 Gross per 24 hour  Intake 1900.26 ml  Output 1900 ml  Net 0.26 ml   Filed Weights   05/16/20 0040 05/17/20 0531 05/18/20 0350  Weight: 93.9 kg 95.7 kg 96.2 kg     Exam:  General: Relatively well-appearing man in good spirits lying in bed in NAD Eyes: sclera anicteric, conjuctiva mild injection bilaterally CVS: S1-S2, regular  Respiratory:  decreased air entry bilaterally secondary to decreased inspiratory effort, rales at bases  GI: NABS, soft, NT  LE: No edema.  Foley draining pink clear urine Neuro: grossly nonfocal.    Assessment & Plan:   Metabolic encephalopathy Improving with treatment of UTI and IV fluids Awaiting SNF placement Head CT negative, ammonia WNL  UTI Clinically improving on ceftriaxone Urine culture was positive for greater than 100,000 GN rods  Acute on chronic kidney injury Improving after  placement of Foley CT showed bilateral hydronephrosis and distended bladder We will place patient on finasteride and Flomax We can try voiding trial if patient stays in house long enough or patient will need outpatient follow-up with urology which she will need for his hematuria anyway as noted below.  Hypokalemia Replete and recheck  Hematuria Possibly secondary to traumatic placement of Foley and or secondary to UTI Can follow-up with urology as an outpatient  DM2 Blood sugars well controlled on present regimen  DVT, left leg Thought to be chronic, not candidate for ACE or procedure  Persistent bradycardia Atenolol and amiodarone are being held Discussed with patient's outpatient cardiologist Dr. Geraldo Pitter who agrees   DVT prophylaxis: Subcu heparin Code Status: Full Family Communication: None Disposition Plan:   Patient is from: Home  Anticipated Discharge Location: SNF  Barriers to Discharge: Awaiting improving but not yet back to baseline kidney function  Is patient medically stable for Discharge: No   Consultants:  None  Procedures:  None  Antimicrobials:  Ceftriaxone   Data Reviewed:  Basic Metabolic Panel: Recent Labs  Lab 05/12/20 0938 05/13/20 0957 05/14/20 0541 05/15/20 0526 05/16/20 0433 05/17/20 0417 05/18/20 1220  NA 143   < > 141 142 141 139 141  K 3.6   < > 3.3* 4.0 3.9 3.7 3.2*  CL 114*   < > 111 111 112* 112* 112*  CO2 21*   < > 22 18* 17* 14* 20*  GLUCOSE 152*   < > 107* 129* 166* 144* 105*  BUN 30*   < > 24* 29* 42* 56* 42*  CREATININE 1.84*   < > 1.60* 1.95* 2.85* 3.36* 2.24*  CALCIUM 8.2*   < > 8.5* 8.8* 8.7* 8.3* 8.2*  MG 2.1  --   --   --   --  2.0  --    < > = values in this interval not displayed.   Liver Function Tests: Recent Labs  Lab 05/14/20 0541 05/15/20 0526 05/16/20 0433 05/17/20 0417 05/18/20 1220  AST 26 24 16 18 16   ALT 23 23 21 18 16   ALKPHOS 74 78 75 70 71  BILITOT 1.0 1.1 1.0 0.7 0.4  PROT 6.0*  6.2* 6.2* 5.5* 5.2*  ALBUMIN 3.1* 3.1* 2.8* 2.4* 2.2*   No results for input(s): LIPASE, AMYLASE in the last 168 hours. No results for input(s): AMMONIA in the last 168 hours. CBC: Recent Labs  Lab 05/13/20 0957 05/15/20 0526 05/16/20 0433 05/17/20 0417 05/18/20 1220  WBC 14.2* 19.7* 32.6* 29.9* 19.2*  HGB 11.5* 12.2* 12.1* 11.2* 10.1*  HCT 35.7* 37.6* 37.4* 34.5* 31.5*  MCV 91.1 93.5 94.0 94.3 93.8  PLT 228 253 251 247 243   Cardiac Enzymes: No results for input(s): CKTOTAL, CKMB, CKMBINDEX, TROPONINI in the last 168 hours. BNP (last 3 results) No results for input(s): PROBNP in the last 8760 hours. CBG: Recent Labs  Lab 05/17/20 1203 05/17/20 1628 05/17/20 2111 05/18/20 0618 05/18/20 1147  GLUCAP 205* 121* 152* 128* 149*    Recent Results (from the past 240 hour(s))  SARS Coronavirus 2 by RT PCR (hospital order, performed in [facility] hospital lab) Nasopharyngeal Nasopharyngeal Swab     Status: None   Collection Time: 05/10/20  8:17 PM   Specimen: Nasopharyngeal Swab  Result Value Ref Range Status   SARS Coronavirus 2 NEGATIVE NEGATIVE Final    Comment: (NOTE) SARS-CoV-2 target nucleic acids are NOT DETECTED.  The SARS-CoV-2 RNA is generally detectable in upper and lower respiratory specimens during the acute phase of infection. The lowest concentration of SARS-CoV-2 viral copies this assay can detect is 250 copies / mL. A negative result does not preclude SARS-CoV-2 infection and should not be used as the sole basis for treatment or other patient management decisions.  A negative result may occur with improper specimen collection / handling, submission of specimen other than nasopharyngeal swab, presence of viral mutation(s) within the areas targeted by this assay, and inadequate number of viral copies (<250 copies / mL). A negative result must be combined with clinical observations, patient history, and epidemiological information.  Fact Sheet for  Patients:   StrictlyIdeas.no  Fact Sheet for Healthcare Providers: BankingDealers.co.za  This test is not yet approved or  cleared by the [place] FDA and has been authorized for detection and/or diagnosis of SARS-CoV-2 by FDA under an Emergency Use Authorization (EUA).  This EUA will remain in effect (meaning this test can be used) for the duration of the COVID-19 declaration under Section 564(b)(1) of the Act, 21 U.S.C. section 360bbb-3(b)(1), unless the authorization is terminated or revoked sooner.  Performed at [facility], [place] [address], [place], [place] [address]   Urine culture     Status: Abnormal   Collection Time: 05/10/20 10:16 PM   Specimen: Urine, Random  Result Value Ref Range Status   Specimen Description URINE, RANDOM  Final   Special Requests NONE  Final   Culture (A)  Final    <10,000 COLONIES/mL INSIGNIFICANT GROWTH Performed at [place] [address], [place], [place] [address]    Report Status 05/11/2020 FINAL  Final  Culture, blood (x 2)     Status: None (Preliminary result)   Collection Time: 05/16/20 11:00 AM   Specimen: BLOOD RIGHT HAND  Result Value Ref Range Status   Specimen Description BLOOD RIGHT HAND  Final   Special Requests   Final    BOTTLES DRAWN AEROBIC ONLY Blood Culture results may not be optimal due to an inadequate volume of blood received in culture bottles   Culture   Final    NO GROWTH 2 DAYS Performed at [facility], [place] [address] [place], [place] [address]    Report Status PENDING  Incomplete  Culture, blood (x 2)     Status: None (Preliminary result)   Collection Time: 05/16/20 11:58 AM   Specimen: BLOOD RIGHT HAND  Result Value Ref Range Status   Specimen Description BLOOD RIGHT HAND  Final   Special Requests   Final    BOTTLES DRAWN AEROBIC ONLY Blood Culture adequate volume   Culture   Final    NO GROWTH 2 DAYS Performed at [facility], [place] [address] [place], [place] [address]    Report Status PENDING  Incomplete  Culture, Urine     Status: Abnormal   Collection Time: 05/16/20 12:23 PM   Specimen: Urine, Random  Result Value Ref Range Status   Specimen Description URINE, RANDOM  Final   Special Requests   Final    NONE Performed at [facility], [place] [address] [place], [place] [address]    Culture >=100,000 COLONIES/mL PROTEUS MIRABILIS (A)  Final   Report Status 05/18/2020 FINAL  Final   Organism ID, Bacteria PROTEUS MIRABILIS (A)  Final      Susceptibility   Proteus mirabilis - MIC*    AMPICILLIN <=2 SENSITIVE Sensitive     CEFAZOLIN <=4 SENSITIVE Sensitive     CEFTRIAXONE <=0.25 SENSITIVE Sensitive     CIPROFLOXACIN <=0.25 SENSITIVE Sensitive     GENTAMICIN <=1 SENSITIVE Sensitive     IMIPENEM 2 SENSITIVE Sensitive     NITROFURANTOIN 128 RESISTANT Resistant     TRIMETH/SULFA <=20 SENSITIVE Sensitive     AMPICILLIN/SULBACTAM <=2 SENSITIVE Sensitive     PIP/TAZO <=4 SENSITIVE Sensitive     * >=100,000 COLONIES/mL PROTEUS MIRABILIS      Studies: CT ABDOMEN PELVIS WO CONTRAST  Result Date: 05/16/2020 CLINICAL DATA:  Hematuria of unknown cause.  Acute renal failure. EXAM: CT ABDOMEN AND PELVIS WITHOUT CONTRAST TECHNIQUE: Multidetector CT imaging of the abdomen and pelvis was performed following the standard protocol without IV contrast. COMPARISON:  None. FINDINGS: Lower chest: Small bilateral pleural effusions. Moderate-sized esophageal hiatal hernia. Hepatobiliary: Homogeneous appearance of the liver. The gallbladder is distended without stone or wall thickening. No bile duct dilatation. Pancreas: Unremarkable. No pancreatic ductal dilatation or surrounding inflammatory changes. Spleen: Normal in size without focal abnormality. Adrenals/Urinary Tract: Left adrenal gland nodule measuring 2.2 cm diameter. Density measurements are -5 Hounsfield units consistent with a benign fat containing adenoma.  Bilateral renal parenchymal thinning. Mild hydronephrosis and hydroureter bilaterally. No obstructing stones are demonstrated. There is infiltration in the retroperitoneal and pelvic fat with mild medial deviation of the ureters. This may indicate retroperitoneal fibrosis. Alternatively, the ureters and collecting systems could be dilated due to reflux from the bladder which is prominently distended. No stone  or filling defect in the bladder. No significant bladder wall thickening. Stomach/Bowel: Stomach and small bowel are decompressed. Stool-filled colon without abnormal distention. The appendix is normal. Vascular/Lymphatic: Aortic atherosclerosis. No enlarged abdominal or pelvic lymph nodes. Reproductive: Prostate gland is enlarged, measuring 5.1 cm diameter. Other: No free air or free fluid in the abdomen. Edema in the subcutaneous fat. Abdominal wall musculature appears intact. Musculoskeletal: Degenerative changes in the spine. No destructive bone lesions. IMPRESSION: 1. Small bilateral pleural effusions. 2. 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 4. Mild hydronephrosis and hydroureter bilaterally. No obstructing stones are demonstrated. There is infiltration in the retroperitoneal and pelvic fat with mild medial deviation of the ureters. This may indicate retroperitoneal fibrosis. Alternatively, the ureters and collecting systems could be dilated due to reflux from the bladder which is prominently distended. 5. Enlarged prostate gland. 6. Aortic atherosclerosis. 7. Edema in the subcutaneous fat. Aortic Atherosclerosis (ICD10-I70.0). Electronically Signed   By: Lucienne Capers M.D.   On: 05/16/2020 23:09   ECHOCARDIOGRAM COMPLETE  Result Date: 05/18/2020    ECHOCARDIOGRAM REPORT   Patient Name:   KODIE KISHI Date of Exam: 05/18/2020 Medical Rec #:  [id number]     Height:       68.0 in Accession #:    [id number]    Weight:       212.1 lb Date of Birth:  05-08-1941      BSA:           2.095 m Patient Age:    48 years      BP:           115/47 mmHg Patient Gender: M             HR:           51 bpm. Exam Location:  Inpatient Procedure: 2D Echo and Intracardiac Opacification Agent Indications:    Atrial Fibrillation I48.91  History:        Patient has prior history of Echocardiogram examinations, most                 recent 09/26/2018. Risk Factors:Hypertension, Diabetes and                 Dyslipidemia.  Sonographer:    Mikki Santee RDCS (AE) Referring Phys: Sharon  1. Left ventricular ejection fraction, by estimation, is 60 to 65%. The left ventricle has normal function. The left ventricle has no regional wall motion abnormalities. Left ventricular diastolic parameters are consistent with Grade I diastolic dysfunction (impaired relaxation).  2. Right ventricular systolic function is normal. The right ventricular size is normal.  3. Right atrial size was moderately dilated.  4. The mitral valve is normal in structure. Trivial mitral valve regurgitation. No evidence of mitral stenosis.  5. The aortic valve is normal in structure. Aortic valve regurgitation is not visualized. No aortic stenosis is present.  6. Pulmonic valve regurgitation is moderate.  7. The inferior vena cava is normal in size with greater than 50% respiratory variability, suggesting right atrial pressure of 3 mmHg. FINDINGS  Left Ventricle: Left ventricular ejection fraction, by estimation, is 60 to 65%. The left ventricle has normal function. The left ventricle has no regional wall motion abnormalities. The left ventricular internal cavity size was normal in size. There is  no left ventricular hypertrophy. Left ventricular diastolic parameters are consistent with Grade I diastolic dysfunction (impaired relaxation). Right Ventricle: The right ventricular size is normal. No increase  in right ventricular wall thickness. Right ventricular systolic function is normal. Left Atrium: Left atrial size  was normal in size. Right Atrium: Right atrial size was moderately dilated. Pericardium: There is no evidence of pericardial effusion. Mitral Valve: The mitral valve is normal in structure. Trivial mitral valve regurgitation. No evidence of mitral valve stenosis. Tricuspid Valve: The tricuspid valve is normal in structure. Tricuspid valve regurgitation is not demonstrated. No evidence of tricuspid stenosis. Aortic Valve: The aortic valve is normal in structure. Aortic valve regurgitation is not visualized. No aortic stenosis is present. Pulmonic Valve: The pulmonic valve was normal in structure. Pulmonic valve regurgitation is moderate. No evidence of pulmonic stenosis. Aorta: The aortic root is normal in size and structure. Venous: The inferior vena cava is normal in size with greater than 50% respiratory variability, suggesting right atrial pressure of 3 mmHg. IAS/Shunts: No atrial level shunt detected by color flow Doppler.  LEFT VENTRICLE PLAX 2D LVIDd:         4.52 cm  Diastology LVIDs:         2.86 cm  LV e' medial:    5.55 cm/s LV PW:         1.05 cm  LV E/e' medial:  15.2 LV IVS:        1.03 cm  LV e' lateral:   10.70 cm/s LVOT diam:     2.30 cm  LV E/e' lateral: 7.9 LV SV:         104 LV SV Index:   50 LVOT Area:     4.15 cm  RIGHT VENTRICLE RV S prime:     18.90 cm/s TAPSE (M-mode): 1.6 cm LEFT ATRIUM             Index       RIGHT ATRIUM           Index LA diam:        3.60 cm 1.72 cm/m  RA Area:     26.70 cm LA Vol (A2C):   66.9 ml 31.93 ml/m RA Volume:   83.80 ml  39.99 ml/m LA Vol (A4C):   66.7 ml 31.83 ml/m LA Biplane Vol: 68.2 ml 32.55 ml/m  AORTIC VALVE LVOT Vmax:   110.00 cm/s LVOT Vmean:  68.600 cm/s LVOT VTI:    0.251 m  AORTA Ao Root diam: 3.60 cm MITRAL VALVE MV Area (PHT): 2.66 cm     SHUNTS MV Decel Time: 285 msec     Systemic VTI:  0.25 m MV E velocity: 84.40 cm/s   Systemic Diam: 2.30 cm MV A velocity: 106.00 cm/s MV E/A ratio:  0.80 Candee Furbish MD Electronically signed by Candee Furbish MD Signature Date/Time: 05/18/2020/12:42:02 PM    Final      Scheduled Meds: . Chlorhexidine Gluconate Cloth  6 each Topical Daily  . insulin aspart  0-9 Units Subcutaneous TID WC  . latanoprost  1 drop Both Eyes QHS  . montelukast  10 mg Oral QHS  . pantoprazole  20 mg Oral BID  . pravastatin  10 mg Oral q1800  . sodium chloride flush  3 mL Intravenous Q12H  . sodium chloride flush  3 mL Intravenous Q12H   Continuous Infusions: . sodium chloride 250 mL (05/18/20 0800)  . cefTRIAXone (ROCEPHIN)  IV 1 g (05/18/20 0801)  . 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

## 2020-05-18 NOTE — Progress Notes (Signed)
Indwelling foley catheter placed 9/28.  Patient urine is red in color with large blood clots.  Patient denies any pain at this time, foley is unclamped, stat lock in place.  Foley care completed, urine is flowing without difficulty.  MD aware.  Will continue to monitor.

## 2020-05-18 NOTE — Progress Notes (Signed)
Occupational Therapy Treatment Patient Details Name: Erik Morales MRN: [id number] DOB: June 10, 1941 Today's Date: 05/18/2020    History of present illness Pt is an 79 y.o. male admitted 05/09/20 with confusion and fall. Found to be bradycardic and hypotensive, also with chronic LLE DVT. Workup for acute encephalopathy, PAF, bradycardia. Head CT negative for acute injury. PMH includes HTN, DM2, CKD IV, PAF.   OT comments  Pt. Did not want to sit in chair. Pt. Was willing to sit EOB for ADLs. Pt. Was Mod A with supine to sit and sit to supine with cues. Pt. Was initially Min A with sitting balance but progressed to min guard assist. Pt. Acute ot to follow.   Follow Up Recommendations  SNF    Equipment Recommendations  3 in 1 bedside commode;Wheelchair (measurements OT);Wheelchair cushion (measurements OT)    Recommendations for Other Services      Precautions / Restrictions Precautions Precautions: Fall Restrictions Weight Bearing Restrictions: No       Mobility Bed Mobility Overal bed mobility: Needs Assistance Bed Mobility: Sit to Supine     Supine to sit: Mod assist Sit to supine: Mod assist   General bed mobility comments: needs cues for technique  Transfers                      Balance     Sitting balance-Leahy Scale: Poor                                     ADL either performed or assessed with clinical judgement   ADL Overall ADL's : Needs assistance/impaired Eating/Feeding: Set up;Bed level   Grooming: Wash/dry hands;Wash/dry face;Sitting;Min guard           Upper Body Dressing : Moderate assistance;Sitting                   Functional mobility during ADLs:  (Pt. refused to sit in chair) General ADL Comments: Pt. sat EOB min guard assist for ADLs.      Vision       Perception     Praxis      Cognition Arousal/Alertness: Awake/alert Behavior During Therapy: WFL for tasks assessed/performed Overall Cognitive  Status: Impaired/Different from baseline Area of Impairment: Orientation;Memory;Safety/judgement                 Orientation Level: Situation       Safety/Judgement: Decreased awareness of deficits;Decreased awareness of safety              Exercises     Shoulder Instructions       General Comments      Pertinent Vitals/ Pain       Pain Assessment: No/denies pain  Home Living                                          Prior Functioning/Environment              Frequency  Min 2X/week        Progress Toward Goals  OT Goals(current goals can now be found in the care plan section)  Progress towards OT goals: Progressing toward goals  Acute Rehab OT Goals Patient Stated Goal: Patient unable to articulate OT Goal Formulation: With patient Time For Goal Achievement: 05/25/20 Potential to  Achieve Goals: Good ADL Goals Pt Will Perform Grooming: Independently;standing Pt Will Perform Lower Body Bathing: with set-up;sit to/from stand Pt Will Perform Lower Body Dressing: with set-up;sit to/from stand Pt Will Transfer to Toilet: with modified independence;ambulating  Plan Discharge plan remains appropriate    Co-evaluation                 AM-PAC OT "6 Clicks" Daily Activity     Outcome Measure   Help from another person eating meals?: A Little Help from another person taking care of personal grooming?: A Little Help from another person toileting, which includes using toliet, bedpan, or urinal?: A Lot Help from another person bathing (including washing, rinsing, drying)?: A Lot Help from another person to put on and taking off regular upper body clothing?: A Lot Help from another person to put on and taking off regular lower body clothing?: A Lot 6 Click Score: 14    End of Session    OT Visit Diagnosis: Unsteadiness on feet (R26.81);Muscle weakness (generalized) (M62.81);Other symptoms and signs involving cognitive  function;Pain   Activity Tolerance Patient limited by fatigue   Patient Left in bed;with call bell/phone within reach;with bed alarm set   Nurse Communication  (ok therapy)        Time: 1624-4695 OT Time Calculation (min): 29 min  Charges: OT General Charges $OT Visit: 1 Visit OT Treatments $Self Care/Home Management : 8-22 mins $Therapeutic Activity: 8-22 mins  Reece Packer OT/L    Lacosta Hargan 05/18/2020, 1:12 PM

## 2020-05-19 LAB — GLUCOSE, CAPILLARY
Glucose-Capillary: 112 mg/dL — ABNORMAL HIGH (ref 70–99)
Glucose-Capillary: 119 mg/dL — ABNORMAL HIGH (ref 70–99)
Glucose-Capillary: 122 mg/dL — ABNORMAL HIGH (ref 70–99)
Glucose-Capillary: 136 mg/dL — ABNORMAL HIGH (ref 70–99)

## 2020-05-19 LAB — BASIC METABOLIC PANEL
Anion gap: 12 (ref 5–15)
BUN: 35 mg/dL — ABNORMAL HIGH (ref 8–23)
CO2: 19 mmol/L — ABNORMAL LOW (ref 22–32)
Calcium: 8.2 mg/dL — ABNORMAL LOW (ref 8.9–10.3)
Chloride: 109 mmol/L (ref 98–111)
Creatinine, Ser: 1.81 mg/dL — ABNORMAL HIGH (ref 0.61–1.24)
GFR calc Af Amer: 40 mL/min — ABNORMAL LOW (ref >60)
GFR calc non Af Amer: 34 mL/min — ABNORMAL LOW (ref >60)
Glucose, Bld: 144 mg/dL — ABNORMAL HIGH (ref 70–99)
Potassium: 3.6 mmol/L (ref 3.5–5.1)
Sodium: 140 mmol/L (ref 135–145)

## 2020-05-19 NOTE — Progress Notes (Signed)
CSW spoke with granddaughter of pt, Erik Morales, she stated they are choosing [facility]. She also stated that she would like a callback from MD. CSW sent MD a chat.

## 2020-05-19 NOTE — Progress Notes (Signed)
Pt has been confused all day. He has been playing with IV and foley catheter. Had to reorient him about where he is and why he is in the hospital.  Had to insert a new IV in the evening since pt managed to playing with IV.

## 2020-05-19 NOTE — Progress Notes (Signed)
PROGRESS NOTE    Erik Morales  CWC:[id number]  DOB: 1941/07/20  DOA: 05/09/2020 PCP: [name], MD Outpatient Specialists:   Hospital course:  79 year old man with HTN, DM 2, CKD 4, PAF not on anticoagulation due to PUD, chronic DVTs was admitted 05/09/2020 with acute metabolic encephalopathy secondary to urinary tract infection.  Patient has been treated with Rocephin.  Course has been complicated by increasing kidney dysfunction and work-up revealed bilateral hydronephrosis and distended bladder.  Foley was placed.  Subjective:  Patient is not sure where he is right now.  He thinks he is" the slam[place]".  When I reoriented him and tell him he is in the hospital he looks at me and nods as if he understands but then asks me where his wife went and whether she went back to "the slam[place]"   Objective: Vitals:   05/18/20 1938 05/19/20 0319 05/19/20 0824 05/19/20 1141  BP: (!) 117/57 135/60 138/72 (!) 153/58  Pulse: (!) 56 63 64 68  Resp: 18 (!) 24  20  Temp: 98.9 F (37.2 C) 98.5 F (36.9 C) 97.8 F (36.6 C) 98.4 F (36.9 C)  TempSrc: Oral Oral Oral Oral  SpO2: 99% 97% 97% 97%  Weight:  95.7 kg    Height:        Intake/Output Summary (Last 24 hours) at 05/19/2020 1559 Last data filed at 05/19/2020 1407 Gross per 24 hour  Intake 1891.95 ml  Output 1875 ml  Net 16.95 ml   Filed Weights   05/17/20 0531 05/18/20 0350 05/19/20 0319  Weight: 95.7 kg 96.2 kg 95.7 kg     Exam:  General: Patient is sleepy and clearly confused today, worse than yesterday  eyes: sclera anicteric, conjuctiva mild injection bilaterally CVS: S1-S2, regular  Respiratory:  decreased air entry bilaterally secondary to decreased inspiratory effort, rales at bases  GI: NABS, soft, NT  LE: No edema.  Foley draining pink clear urine Neuro: grossly nonfocal.    Assessment & Plan:   Metabolic encephalopathy Waxing and waning mental status, patient was quite confused this morning although was  better yesterday. Will reassess later in the day, see if he is improved after he is more awake. We will continue treatment of UTI with ceftriaxone  Awaiting SNF placement Head CT negative, ammonia WNL  UTI Continue ceftriaxone Urine culture was positive for greater than 100,000 GN rods  Acute on chronic kidney injury Creatinine continues to improve after placement of Foley, down today to 1.8 from 3.4 on 05/17/2020. CT showed bilateral hydronephrosis and distended bladder We will place patient on finasteride and Flomax Patient will need voiding trial if patient stays in house long enough or patient will need outpatient follow-up with urology.  Hypokalemia Normalized to 3.6 after repletion  Hematuria Urine looked much lighter today Hematuria was possibly secondary to traumatic placement of Foley and or secondary to UTI Can follow-up with urology as an outpatient if warranted  DM2 Blood sugars well controlled on present regimen  DVT, left leg Thought to be chronic, not candidate for ACE or procedure  Persistent bradycardia Atenolol and amiodarone are being held Discussed with patient's outpatient cardiologist Dr. Geraldo Pitter who agrees   DVT prophylaxis: Subcu heparin Code Status: Full Family Communication: None Disposition Plan:   Patient is from: Home  Anticipated Discharge Location: SNF  Barriers to Discharge: Awaiting improving but not yet back to baseline kidney function  Is patient medically stable for Discharge: No   Consultants:  None  Procedures:  None  Antimicrobials:  Ceftriaxone   Data Reviewed:  Basic Metabolic Panel: Recent Labs  Lab 05/15/20 0526 05/16/20 0433 05/17/20 0417 05/18/20 1220 05/19/20 0912  NA 142 141 139 141 140  K 4.0 3.9 3.7 3.2* 3.6  CL 111 112* 112* 112* 109  CO2 18* 17* 14* 20* 19*  GLUCOSE 129* 166* 144* 105* 144*  BUN 29* 42* 56* 42* 35*  CREATININE 1.95* 2.85* 3.36* 2.24* 1.81*  CALCIUM 8.8* 8.7* 8.3* 8.2* 8.2*    MG  --   --  2.0  --   --    Liver Function Tests: Recent Labs  Lab 05/14/20 0541 05/15/20 0526 05/16/20 0433 05/17/20 0417 05/18/20 1220  AST 26 24 16 18 16   ALT 23 23 21 18 16   ALKPHOS 74 78 75 70 71  BILITOT 1.0 1.1 1.0 0.7 0.4  PROT 6.0* 6.2* 6.2* 5.5* 5.2*  ALBUMIN 3.1* 3.1* 2.8* 2.4* 2.2*   No results for input(s): LIPASE, AMYLASE in the last 168 hours. No results for input(s): AMMONIA in the last 168 hours. CBC: Recent Labs  Lab 05/13/20 0957 05/15/20 0526 05/16/20 0433 05/17/20 0417 05/18/20 1220  WBC 14.2* 19.7* 32.6* 29.9* 19.2*  HGB 11.5* 12.2* 12.1* 11.2* 10.1*  HCT 35.7* 37.6* 37.4* 34.5* 31.5*  MCV 91.1 93.5 94.0 94.3 93.8  PLT 228 253 251 247 243   Cardiac Enzymes: No results for input(s): CKTOTAL, CKMB, CKMBINDEX, TROPONINI in the last 168 hours. BNP (last 3 results) No results for input(s): PROBNP in the last 8760 hours. CBG: Recent Labs  Lab 05/18/20 1147 05/18/20 1636 05/18/20 2140 05/19/20 0621 05/19/20 1143  GLUCAP 149* 82 130* 112* 119*    Recent Results (from the past 240 hour(s))  SARS Coronavirus 2 by RT PCR (hospital order, performed in [facility] hospital lab) Nasopharyngeal Nasopharyngeal Swab     Status: None   Collection Time: 05/10/20  8:17 PM   Specimen: Nasopharyngeal Swab  Result Value Ref Range Status   SARS Coronavirus 2 NEGATIVE NEGATIVE Final    Comment: (NOTE) SARS-CoV-2 target nucleic acids are NOT DETECTED.  The SARS-CoV-2 RNA is generally detectable in upper and lower respiratory specimens during the acute phase of infection. The lowest concentration of SARS-CoV-2 viral copies this assay can detect is 250 copies / mL. A negative result does not preclude SARS-CoV-2 infection and should not be used as the sole basis for treatment or other patient management decisions.  A negative result may occur with improper specimen collection / handling, submission of specimen other than nasopharyngeal swab, presence of  viral mutation(s) within the areas targeted by this assay, and inadequate number of viral copies (<250 copies / mL). A negative result must be combined with clinical observations, patient history, and epidemiological information.  Fact Sheet for Patients:   StrictlyIdeas.no  Fact Sheet for Healthcare Providers: BankingDealers.co.za  This test is not yet approved or  cleared by the [place] FDA and has been authorized for detection and/or diagnosis of SARS-CoV-2 by FDA under an Emergency Use Authorization (EUA).  This EUA will remain in effect (meaning this test can be used) for the duration of the COVID-19 declaration under Section 564(b)(1) of the Act, 21 U.S.C. section 360bbb-3(b)(1), unless the authorization is terminated or revoked sooner.  Performed at [facility], [place] [address] [place], [place] [address]   Urine culture     Status: Abnormal   Collection Time: 05/10/20 10:16 PM   Specimen: Urine, Random  Result Value Ref Range Status  Specimen Description URINE, RANDOM  Final   Special Requests NONE  Final   Culture (A)  Final    <10,000 COLONIES/mL INSIGNIFICANT GROWTH Performed at [facility], [place] [address], [place], [place] [address]    Report Status 05/11/2020 FINAL  Final  Culture, blood (x 2)     Status: None (Preliminary result)   Collection Time: 05/16/20 11:00 AM   Specimen: BLOOD RIGHT HAND  Result Value Ref Range Status   Specimen Description BLOOD RIGHT HAND  Final   Special Requests   Final    BOTTLES DRAWN AEROBIC ONLY Blood Culture results may not be optimal due to an inadequate volume of blood received in culture bottles   Culture   Final    NO GROWTH 3 DAYS Performed at [facility], [place] [address], [place], [place] [address]    Report Status PENDING  Incomplete  Culture, blood (x 2)     Status: None (Preliminary result)   Collection Time: 05/16/20 11:58 AM   Specimen: BLOOD  RIGHT HAND  Result Value Ref Range Status   Specimen Description BLOOD RIGHT HAND  Final   Special Requests   Final    BOTTLES DRAWN AEROBIC ONLY Blood Culture adequate volume   Culture   Final    NO GROWTH 3 DAYS Performed at [facility], [place] [address], [place], [place] [address]    Report Status PENDING  Incomplete  Culture, Urine     Status: Abnormal   Collection Time: 05/16/20 12:23 PM   Specimen: Urine, Random  Result Value Ref Range Status   Specimen Description URINE, RANDOM  Final   Special Requests   Final    NONE Performed at [facility], [place] [address], [place], [place] [address]    Culture >=100,000 COLONIES/mL PROTEUS MIRABILIS (A)  Final   Report Status 05/18/2020 FINAL  Final   Organism ID, Bacteria PROTEUS MIRABILIS (A)  Final      Susceptibility   Proteus mirabilis - MIC*    AMPICILLIN <=2 SENSITIVE Sensitive     CEFAZOLIN <=4 SENSITIVE Sensitive     CEFTRIAXONE <=0.25 SENSITIVE Sensitive     CIPROFLOXACIN <=0.25 SENSITIVE Sensitive     GENTAMICIN <=1 SENSITIVE Sensitive     IMIPENEM 2 SENSITIVE Sensitive     NITROFURANTOIN 128 RESISTANT Resistant     TRIMETH/SULFA <=20 SENSITIVE Sensitive     AMPICILLIN/SULBACTAM <=2 SENSITIVE Sensitive     PIP/TAZO <=4 SENSITIVE Sensitive     * >=100,000 COLONIES/mL PROTEUS MIRABILIS      Studies: ECHOCARDIOGRAM COMPLETE  Result Date: 05/18/2020    ECHOCARDIOGRAM REPORT   Patient Name:   GABRIEN MENTINK Date of Exam: 05/18/2020 Medical Rec #:  [id number]     Height:       68.0 in Accession #:    [id number]    Weight:       212.1 lb Date of Birth:  1941/01/25      BSA:          2.095 m Patient Age:    35 years      BP:           115/47 mmHg Patient Gender: M             HR:           51 bpm. Exam Location:  Inpatient Procedure: 2D Echo and Intracardiac Opacification Agent Indications:    Atrial Fibrillation I48.91  History:  Patient has prior history of Echocardiogram examinations, most                 recent  09/26/2018. Risk Factors:Hypertension, Diabetes and                 Dyslipidemia.  Sonographer:    Mikki Santee RDCS (AE) Referring Phys: [place]  1. Left ventricular ejection fraction, by estimation, is 60 to 65%. The left ventricle has normal function. The left ventricle has no regional wall motion abnormalities. Left ventricular diastolic parameters are consistent with Grade I diastolic dysfunction (impaired relaxation).  2. Right ventricular systolic function is normal. The right ventricular size is normal.  3. Right atrial size was moderately dilated.  4. The mitral valve is normal in structure. Trivial mitral valve regurgitation. No evidence of mitral stenosis.  5. The aortic valve is normal in structure. Aortic valve regurgitation is not visualized. No aortic stenosis is present.  6. Pulmonic valve regurgitation is moderate.  7. The inferior vena cava is normal in size with greater than 50% respiratory variability, suggesting right atrial pressure of 3 mmHg. FINDINGS  Left Ventricle: Left ventricular ejection fraction, by estimation, is 60 to 65%. The left ventricle has normal function. The left ventricle has no regional wall motion abnormalities. The left ventricular internal cavity size was normal in size. There is  no left ventricular hypertrophy. Left ventricular diastolic parameters are consistent with Grade I diastolic dysfunction (impaired relaxation). Right Ventricle: The right ventricular size is normal. No increase in right ventricular wall thickness. Right ventricular systolic function is normal. Left Atrium: Left atrial size was normal in size. Right Atrium: Right atrial size was moderately dilated. Pericardium: There is no evidence of pericardial effusion. Mitral Valve: The mitral valve is normal in structure. Trivial mitral valve regurgitation. No evidence of mitral valve stenosis. Tricuspid Valve: The tricuspid valve is normal in structure. Tricuspid valve  regurgitation is not demonstrated. No evidence of tricuspid stenosis. Aortic Valve: The aortic valve is normal in structure. Aortic valve regurgitation is not visualized. No aortic stenosis is present. Pulmonic Valve: The pulmonic valve was normal in structure. Pulmonic valve regurgitation is moderate. No evidence of pulmonic stenosis. Aorta: The aortic root is normal in size and structure. Venous: The inferior vena cava is normal in size with greater than 50% respiratory variability, suggesting right atrial pressure of 3 mmHg. IAS/Shunts: No atrial level shunt detected by color flow Doppler.  LEFT VENTRICLE PLAX 2D LVIDd:         4.52 cm  Diastology LVIDs:         2.86 cm  LV e' medial:    5.55 cm/s LV PW:         1.05 cm  LV E/e' medial:  15.2 LV IVS:        1.03 cm  LV e' lateral:   10.70 cm/s LVOT diam:     2.30 cm  LV E/e' lateral: 7.9 LV SV:         104 LV SV Index:   50 LVOT Area:     4.15 cm  RIGHT VENTRICLE RV S prime:     18.90 cm/s TAPSE (M-mode): 1.6 cm LEFT ATRIUM             Index       RIGHT ATRIUM           Index LA diam:        3.60 cm 1.72 cm/m  RA Area:  26.70 cm LA Vol (A2C):   66.9 ml 31.93 ml/m RA Volume:   83.80 ml  39.99 ml/m LA Vol (A4C):   66.7 ml 31.83 ml/m LA Biplane Vol: 68.2 ml 32.55 ml/m  AORTIC VALVE LVOT Vmax:   110.00 cm/s LVOT Vmean:  68.600 cm/s LVOT VTI:    0.251 m  AORTA Ao Root diam: 3.60 cm MITRAL VALVE MV Area (PHT): 2.66 cm     SHUNTS MV Decel Time: 285 msec     Systemic VTI:  0.25 m MV E velocity: 84.40 cm/s   Systemic Diam: 2.30 cm MV A velocity: 106.00 cm/s MV E/A ratio:  0.80 Candee Furbish MD Electronically signed by Candee Furbish MD Signature Date/Time: 05/18/2020/12:42:02 PM    Final      Scheduled Meds: . Chlorhexidine Gluconate Cloth  6 each Topical Daily  . finasteride  5 mg Oral Daily  . insulin aspart  0-9 Units Subcutaneous TID WC  . latanoprost  1 drop Both Eyes QHS  . montelukast  10 mg Oral QHS  . pantoprazole  20 mg Oral BID  . pravastatin   10 mg Oral q1800  . sodium chloride flush  3 mL Intravenous Q12H  . sodium chloride flush  3 mL Intravenous Q12H  . tamsulosin  0.4 mg Oral QPC supper   Continuous Infusions: . sodium chloride Stopped (05/18/20 1034)  . cefTRIAXone (ROCEPHIN)  IV 1 g (05/19/20 0901)  . 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

## 2020-05-20 DIAGNOSIS — E1122 Type 2 diabetes mellitus with diabetic chronic kidney disease: Secondary | ICD-10-CM | POA: Diagnosis not present

## 2020-05-20 DIAGNOSIS — E088 Diabetes mellitus due to underlying condition with unspecified complications: Secondary | ICD-10-CM | POA: Diagnosis not present

## 2020-05-20 DIAGNOSIS — R001 Bradycardia, unspecified: Secondary | ICD-10-CM | POA: Diagnosis not present

## 2020-05-20 DIAGNOSIS — I82502 Chronic embolism and thrombosis of unspecified deep veins of left lower extremity: Secondary | ICD-10-CM | POA: Diagnosis not present

## 2020-05-20 DIAGNOSIS — N184 Chronic kidney disease, stage 4 (severe): Secondary | ICD-10-CM | POA: Diagnosis not present

## 2020-05-20 DIAGNOSIS — M5459 Other low back pain: Secondary | ICD-10-CM | POA: Diagnosis not present

## 2020-05-20 DIAGNOSIS — N390 Urinary tract infection, site not specified: Secondary | ICD-10-CM | POA: Diagnosis not present

## 2020-05-20 DIAGNOSIS — I480 Paroxysmal atrial fibrillation: Secondary | ICD-10-CM | POA: Diagnosis not present

## 2020-05-20 DIAGNOSIS — G9340 Encephalopathy, unspecified: Secondary | ICD-10-CM | POA: Diagnosis not present

## 2020-05-20 DIAGNOSIS — Z7401 Bed confinement status: Secondary | ICD-10-CM | POA: Diagnosis not present

## 2020-05-20 DIAGNOSIS — M2550 Pain in unspecified joint: Secondary | ICD-10-CM | POA: Diagnosis not present

## 2020-05-20 DIAGNOSIS — M6281 Muscle weakness (generalized): Secondary | ICD-10-CM | POA: Diagnosis not present

## 2020-05-20 DIAGNOSIS — Z743 Need for continuous supervision: Secondary | ICD-10-CM | POA: Diagnosis not present

## 2020-05-20 DIAGNOSIS — I10 Essential (primary) hypertension: Secondary | ICD-10-CM | POA: Diagnosis not present

## 2020-05-20 DIAGNOSIS — R531 Weakness: Secondary | ICD-10-CM | POA: Diagnosis not present

## 2020-05-20 DIAGNOSIS — E785 Hyperlipidemia, unspecified: Secondary | ICD-10-CM | POA: Diagnosis not present

## 2020-05-20 DIAGNOSIS — E559 Vitamin D deficiency, unspecified: Secondary | ICD-10-CM | POA: Diagnosis not present

## 2020-05-20 DIAGNOSIS — R2689 Other abnormalities of gait and mobility: Secondary | ICD-10-CM | POA: Diagnosis not present

## 2020-05-20 LAB — CBC WITH DIFFERENTIAL/PLATELET
Abs Immature Granulocytes: 1.36 10*3/uL — ABNORMAL HIGH (ref 0.00–0.07)
Basophils Absolute: 0 10*3/uL (ref 0.0–0.1)
Basophils Relative: 0 %
Eosinophils Absolute: 0.1 10*3/uL (ref 0.0–0.5)
Eosinophils Relative: 1 %
HCT: 33.2 % — ABNORMAL LOW (ref 39.0–52.0)
Hemoglobin: 10.9 g/dL — ABNORMAL LOW (ref 13.0–17.0)
Immature Granulocytes: 7 %
Lymphocytes Relative: 13 %
Lymphs Abs: 2.5 10*3/uL (ref 0.7–4.0)
MCH: 30.6 pg (ref 26.0–34.0)
MCHC: 32.8 g/dL (ref 30.0–36.0)
MCV: 93.3 fL (ref 80.0–100.0)
Monocytes Absolute: 2.3 10*3/uL — ABNORMAL HIGH (ref 0.1–1.0)
Monocytes Relative: 12 %
Neutro Abs: 12.6 10*3/uL — ABNORMAL HIGH (ref 1.7–7.7)
Neutrophils Relative %: 67 %
Platelets: 255 10*3/uL (ref 150–400)
RBC: 3.56 MIL/uL — ABNORMAL LOW (ref 4.22–5.81)
RDW: 14.3 % (ref 11.5–15.5)
WBC: 18.9 10*3/uL — ABNORMAL HIGH (ref 4.0–10.5)
nRBC: 0 % (ref 0.0–0.2)

## 2020-05-20 LAB — RENAL FUNCTION PANEL
Albumin: 2.3 g/dL — ABNORMAL LOW (ref 3.5–5.0)
Anion gap: 12 (ref 5–15)
BUN: 25 mg/dL — ABNORMAL HIGH (ref 8–23)
CO2: 20 mmol/L — ABNORMAL LOW (ref 22–32)
Calcium: 8.2 mg/dL — ABNORMAL LOW (ref 8.9–10.3)
Chloride: 111 mmol/L (ref 98–111)
Creatinine, Ser: 1.42 mg/dL — ABNORMAL HIGH (ref 0.61–1.24)
GFR calc Af Amer: 53 mL/min — ABNORMAL LOW (ref >60)
GFR calc non Af Amer: 46 mL/min — ABNORMAL LOW (ref >60)
Glucose, Bld: 116 mg/dL — ABNORMAL HIGH (ref 70–99)
Phosphorus: 2.5 mg/dL (ref 2.5–4.6)
Potassium: 3.4 mmol/L — ABNORMAL LOW (ref 3.5–5.1)
Sodium: 143 mmol/L (ref 135–145)

## 2020-05-20 LAB — GLUCOSE, CAPILLARY
Glucose-Capillary: 103 mg/dL — ABNORMAL HIGH (ref 70–99)
Glucose-Capillary: 115 mg/dL — ABNORMAL HIGH (ref 70–99)
Glucose-Capillary: 124 mg/dL — ABNORMAL HIGH (ref 70–99)

## 2020-05-20 LAB — SARS CORONAVIRUS 2 BY RT PCR (HOSPITAL ORDER, PERFORMED IN ~~LOC~~ HOSPITAL LAB): SARS Coronavirus 2: NEGATIVE

## 2020-05-20 MED ORDER — TAMSULOSIN HCL 0.4 MG PO CAPS: 0.4000 mg | ORAL_CAPSULE | Freq: Every day | ORAL | 0 refills | Status: DC

## 2020-05-20 MED ORDER — SULFAMETHOXAZOLE-TRIMETHOPRIM 800-160 MG PO TABS
1.0000 | ORAL_TABLET | Freq: Two times a day (BID) | ORAL | Status: DC
  Administered 2020-05-20: 1 via ORAL
  Filled 2020-05-20 (×3): qty 1

## 2020-05-20 MED ORDER — FINASTERIDE 5 MG PO TABS: 5.0000 mg | ORAL_TABLET | Freq: Every day | ORAL | 0 refills | Status: DC

## 2020-05-20 MED ORDER — SULFAMETHOXAZOLE-TRIMETHOPRIM 800-160 MG PO TABS: 1.0000 | ORAL_TABLET | Freq: Two times a day (BID) | ORAL | 0 refills | Status: DC

## 2020-05-20 NOTE — Progress Notes (Signed)
D/C instructions printed and placed in packet at nurse's station. Tele and IV removed, tolerated well.  

## 2020-05-20 NOTE — TOC Transition Note (Signed)
Transition of Care [facility]) - CM/SW Discharge Note   Patient Details  Name: Erik Morales MRN: [id number] Date of Birth: 1940-10-21  Transition of Care [facility]) CM/SW Contact:  [address], [place] Phone Number: 05/20/2020, 2:37 PM   Clinical Narrative:    Patient will DC to:  [place] date:  05/20/20 Family notified:  Jorje Guild Transport by:  Corey Harold   Per MD patient ready for DC to [facility]. RN to call report prior to discharge [phone], room 122 AA. RN, patient, patient's family, and facility notified of DC. Discharge Summary and FL2 sent to facility. DC packet on chart. Ambulance transport requested for patient.   CSW will sign off for now as social work intervention is no longer needed. Please consult [place] again if new needs arise.   Final next level of care: [place] [facility]) Barriers to Discharge: Barriers Resolved   Patient Goals and CMS Choice Patient states their goals for this hospitalization and ongoing recovery are:: Get better soon CMS Medicare.gov Compare Post Acute Care list provided to:: Patient Represenative (must comment) Cloyce Blankenhorn [phone]) Choice offered to / list presented to : Adult Children (Granddaughter)  Discharge Placement PASRR number recieved: 09/24/18            Patient chooses bed at: [facility] Patient to be transferred to facility by: [place] Name of family member notified: Jaisen Wiltrout Patient and family notified of of transfer: 05/20/20  Discharge Plan and Services In-house Referral: Clinical Social Work                                   Social Determinants of Health (SDOH) Interventions     Readmission Risk Interventions No flowsheet data found.

## 2020-05-20 NOTE — Consult Note (Signed)
   [facility] CM Inpatient Consult   05/20/2020  Erik Morales 1941/06/22 [id number]   [facility] [ACO] Patient:  [name]   Patient screened for high risk score for unplanned readmission score and for 10 day LOS hospitalization to check if potential [place] Management service needs.  Review of patient's medical record reveals patient is for a skilled nursing facility stay.  Plan: Patient is currently for a skilled nursing facility level of care for rehab.  Patient transition of care needs are to be met at that level of care.    Please place a [facility] Care Management consult as appropriate and for questions contact:   Natividad Brood, RN BSN [facility] Liaison  [phone] business mobile phone Toll free office [phone]  Fax number: [phone] [name]

## 2020-05-20 NOTE — Plan of Care (Signed)

## 2020-05-20 NOTE — Discharge Summary (Signed)
Physician Discharge Summary  Erik Morales TFT:[id number] DOB: 05-Nov-1940 DOA: 05/09/2020  PCP: [name], MD  Admit date: 05/09/2020 Discharge date: 05/20/2020  Time spent: 27 minutes  Recommendations for Outpatient Follow-up:  1. Complete 2 more days of antibiotics in the outpatient setting for Proteus UTI 2. Recommend CBC Chem-12 on follow-up at skilled facility 3. 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 Acute toxic metabolic encephalopathy 1. Secondary to combination of probable UTI in a setting of underlying cognitive issues 2. Improved some but still not completely coherent on discharge 3. Will require further treatment with antibiotics to complete treatment for UTI and expect should improve slowly 2. UTI from Proteus 1. Pansensitive other than Macrodantin therefore prescribed Septra on discharge for 2 more days 2. Needs repeat CBC Chem-7  in about 1 week 3. Acute superimposed on chronic kidney disease 1. Came in on ACE inhibitor and multiple antihypertensives 2. Meds adjusted as per MAR 3. Somewhat improved from admission-recheck in about a week 4. Hematuria 1. Likely secondary to Foley catheter trauma without recurrence-monitor 5. DM TY 2 1. Prior to admission was on Amaryl which was resumed- 2. Patient was on sliding scale and blood sugars were relatively well controlled in hospital between 10 3-1 20 6. Atrial fibrillation CHADS2 score >3 not on anticoagulation secondary to ulcer complicated by some bradycardia 1. Not on anticoagulation 2. This admission amiodarone and beta-blocker discontinued because of the bradycardia 3. This will need to be reevaluated in the outpatient setting 7. Prior GI bleed secondary to duodenal ulcer 09/2018 1. Stable during hospital stay    Procedures:  Multiple (i.e. Studies not automatically included, echos, thoracentesis, etc; not x-rays)  Consultations:  None  Discharge Exam: Vitals:   05/20/20 0510 05/20/20 1102  BP: (!) 144/62 (!) 134/49  Pulse: (!) 52 (!) 52  Resp: 15 17  Temp: 98.1 F (36.7 C) 98.6 F (37 C)  SpO2: 99% 97%    General: Awake alert coherent no distress EOMI NCAT no focal deficit but was confused earlier according to nursing staff pulling at lines pulling IVs Cardiovascular: S1-S2 no murmur rub or gallop Respiratory: Clinically clear no added sound no rales no rhonchi Neurologically intact moving all 4 limbs equally no focal deficit  psych euthymic congruent  Discharge Instructions   Discharge Instructions    Diet - low sodium heart healthy   Complete by: As directed    Increase activity slowly   Complete by: As directed  No wound care   Complete by: As directed      Allergies as of 05/20/2020   No Known Allergies     Medication List    STOP taking these medications   amiodarone 200 MG tablet Commonly known as: PACERONE   atenolol  100 MG tablet Commonly known as: TENORMIN   docusate sodium 100 MG capsule Commonly known as: COLACE   lisinopril 20 MG tablet Commonly known as: ZESTRIL   multivitamin with minerals Tabs tablet   Vascepa 1 g capsule Generic drug: icosapent Ethyl     TAKE these medications   acetaminophen 500 MG tablet Commonly known as: TYLENOL Take 500 mg by mouth every 6 (six) hours as needed for headache (pain).   albuterol 108 (90 Base) MCG/ACT inhaler Commonly known as: VENTOLIN HFA Inhale 1 puff into the lungs every 6 (six) hours as needed for shortness of breath.   ferrous sulfate 325 (65 FE) MG tablet Take 325 mg by mouth daily with breakfast.   finasteride 5 MG tablet Commonly known as: PROSCAR Take 1 tablet (5 mg total) by mouth daily. Start taking on: May 21, 2020   fluticasone 50 MCG/ACT nasal spray Commonly known as: FLONASE Place 2 sprays into both nostrils daily.   glimepiride 1 MG tablet Commonly known as: AMARYL Take 1 tablet (1 mg total) by mouth daily with breakfast.   latanoprost 0.005 % ophthalmic solution Commonly known as: XALATAN Place 1 drop into both eyes at bedtime.   montelukast 10 MG tablet Commonly known as: SINGULAIR Take 1 tablet (10 mg total) by mouth at bedtime as needed (allergies).   pantoprazole 20 MG tablet Commonly known as: PROTONIX Take 20 mg by mouth 2 (two) times daily.   polyethylene glycol 17 g packet Commonly known as: MIRALAX / GLYCOLAX Take 17 g by mouth daily as needed for mild constipation.   potassium chloride 10 MEQ tablet Commonly known as: KLOR-CON Take 10 mEq by mouth daily.   pravastatin 40 MG tablet Commonly known as: PRAVACHOL Take 0.5 tablets (20 mg total) by mouth at bedtime.   sulfamethoxazole-trimethoprim 800-160 MG tablet Commonly known as: BACTRIM DS Take 1 tablet by mouth every 12 (twelve) hours.   tamsulosin 0.4 MG Caps capsule Commonly known as: FLOMAX Take 1 capsule (0.4 mg total) by mouth  daily after supper.   Vitamin D (Ergocalciferol) 1.25 MG (50000 UNIT) Caps capsule Commonly known as: DRISDOL Take 50,000 Units by mouth every 7 (seven) days.      No Known Allergies    The results of significant diagnostics from this hospitalization (including imaging, microbiology, ancillary and laboratory) are listed below for reference.    Significant Diagnostic Studies: CT ABDOMEN PELVIS WO CONTRAST  Result Date: 05/16/2020 CLINICAL DATA:  Hematuria of unknown cause.  Acute renal failure. EXAM: CT ABDOMEN AND PELVIS WITHOUT CONTRAST TECHNIQUE: Multidetector CT imaging of the abdomen and pelvis was performed following the standard protocol without IV contrast. COMPARISON:  None. FINDINGS: Lower chest: Small bilateral pleural effusions. Moderate-sized esophageal hiatal hernia. Hepatobiliary: Homogeneous appearance of the liver. The gallbladder is distended without stone or wall thickening. No bile duct dilatation. Pancreas: Unremarkable. No pancreatic ductal dilatation or surrounding inflammatory changes. Spleen: Normal in size without focal abnormality. Adrenals/Urinary Tract: Left adrenal gland nodule measuring 2.2 cm diameter. Density measurements are -5 Hounsfield units consistent with a benign fat containing adenoma. Bilateral renal parenchymal thinning. Mild hydronephrosis and hydroureter bilaterally. No obstructing stones are demonstrated. There is infiltration in the retroperitoneal and  pelvic fat with mild medial deviation of the ureters. This may indicate retroperitoneal fibrosis. Alternatively, the ureters and collecting systems could be dilated due to reflux from the bladder which is prominently distended. No stone or filling defect in the bladder. No significant bladder wall thickening. Stomach/Bowel: Stomach and small bowel are decompressed. Stool-filled colon without abnormal distention. The appendix is normal. Vascular/Lymphatic: Aortic atherosclerosis. No enlarged abdominal or  pelvic lymph nodes. Reproductive: Prostate gland is enlarged, measuring 5.1 cm diameter. Other: No free air or free fluid in the abdomen. Edema in the subcutaneous fat. Abdominal wall musculature appears intact. Musculoskeletal: Degenerative changes in the spine. No destructive bone lesions. IMPRESSION: 1. Small bilateral pleural effusions. 2. 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 4. Mild hydronephrosis and hydroureter bilaterally. No obstructing stones are demonstrated. There is infiltration in the retroperitoneal and pelvic fat with mild medial deviation of the ureters. This may indicate retroperitoneal fibrosis. Alternatively, the ureters and collecting systems could be dilated due to reflux from the bladder which is prominently distended. 5. Enlarged prostate gland. 6. Aortic atherosclerosis. 7. Edema in the subcutaneous fat. Aortic Atherosclerosis (ICD10-I70.0). Electronically Signed   By: Lucienne Capers M.D.   On: 05/16/2020 23:09   CT Head Wo Contrast  Result Date: 05/10/2020 CLINICAL DATA:  Fall 3 days ago, altered. Difficulty walking and increased weakness per family. Left leg swelling. EXAM: CT HEAD WITHOUT CONTRAST CT CERVICAL SPINE WITHOUT CONTRAST TECHNIQUE: Multidetector CT imaging of the head and cervical spine was performed following the standard protocol without intravenous contrast. Multiplanar CT image reconstructions of the cervical spine were also generated. COMPARISON:  None. FINDINGS: CT HEAD FINDINGS Brain: Cerebral ventricle sizes are concordant with the degree of cerebral volume loss. Patchy and confluent areas of decreased attenuation are noted throughout the deep and periventricular white matter of the cerebral hemispheres bilaterally, compatible with chronic microvascular ischemic disease. No evidence of large-territorial acute infarction. No parenchymal hemorrhage. No mass lesion. No extra-axial collection. No mass effect or midline shift. No  hydrocephalus. Basilar cisterns are patent. Vascular: No hyperdense vessel or unexpected calcification. Skull: Negative for fracture or focal lesion. Sinuses/Orbits: Mucosal thickening with likely inspissated mucus within the left sphenoid sinus. Otherwise paranasal sinuses mastoid air cells are clear with improved aeration compared to prior CT. Other: None CT CERVICAL SPINE FINDINGS Alignment: Normal.  Patient's head appears to be rotated. Skull base and vertebrae: Vague cortical irregularity on sagittal view posterior to the base of dens likely represents an old healed fracture versus degenerative changes (8:27). Multilevel discontinues bulky osteophyte formation well-corticated. Multilevel facet arthropathy and uncovertebral arthropathy. No acute fracture. No primary bone lesion or focal pathologic process. Soft tissues and spinal canal: No prevertebral fluid or swelling. No visible canal hematoma. Disc levels: Multilevel intervertebral disc space narrowing mild-to-moderate. Upper chest: Negative. Other: No acute displaced fracture of the visualized ribs. IMPRESSION: 1. No acute intracranial abnormality. 2. No acute displaced fracture or traumatic listhesis of the cervical spine. Electronically Signed   By: Iven Finn M.D.   On: 05/10/2020 18:50   CT Cervical Spine Wo Contrast  Result Date: 05/10/2020 CLINICAL DATA:  Fall 3 days ago, altered. Difficulty walking and increased weakness per family. Left leg swelling. EXAM: CT HEAD WITHOUT CONTRAST CT CERVICAL SPINE WITHOUT CONTRAST TECHNIQUE: Multidetector CT imaging of the head and cervical spine was performed following the standard protocol without intravenous contrast. Multiplanar CT image reconstructions of the cervical spine were also generated. COMPARISON:  None. FINDINGS: CT HEAD FINDINGS Brain:  Cerebral ventricle sizes are concordant with the degree of cerebral volume loss. Patchy and confluent areas of decreased attenuation are noted throughout the  deep and periventricular white matter of the cerebral hemispheres bilaterally, compatible with chronic microvascular ischemic disease. No evidence of large-territorial acute infarction. No parenchymal hemorrhage. No mass lesion. No extra-axial collection. No mass effect or midline shift. No hydrocephalus. Basilar cisterns are patent. Vascular: No hyperdense vessel or unexpected calcification. Skull: Negative for fracture or focal lesion. Sinuses/Orbits: Mucosal thickening with likely inspissated mucus within the left sphenoid sinus. Otherwise paranasal sinuses mastoid air cells are clear with improved aeration compared to prior CT. Other: None CT CERVICAL SPINE FINDINGS Alignment: Normal.  Patient's head appears to be rotated. Skull base and vertebrae: Vague cortical irregularity on sagittal view posterior to the base of dens likely represents an old healed fracture versus degenerative changes (8:27). Multilevel discontinues bulky osteophyte formation well-corticated. Multilevel facet arthropathy and uncovertebral arthropathy. No acute fracture. No primary bone lesion or focal pathologic process. Soft tissues and spinal canal: No prevertebral fluid or swelling. No visible canal hematoma. Disc levels: Multilevel intervertebral disc space narrowing mild-to-moderate. Upper chest: Negative. Other: No acute displaced fracture of the visualized ribs. IMPRESSION: 1. No acute intracranial abnormality. 2. No acute displaced fracture or traumatic listhesis of the cervical spine. Electronically Signed   By: Iven Finn M.D.   On: 05/10/2020 18:50   US RENAL  Result Date: 05/16/2020 CLINICAL DATA:  acute renal failure. EXAM: RENAL / URINARY TRACT ULTRASOUND COMPLETE COMPARISON:  09/26/2018 FINDINGS: Right Kidney: Renal measurements: 9.2 x5.0 x 4.8 cm = volume: 114.5 mL. Diffuse cortical thinning. Echogenicity within normal limits. No mass or hydronephrosis visualized. Left Kidney: Renal measurements: 8.9 x 5.1 by 4.7 cm  = volume: 112.6 mL. Diffuse cortical thinning. Echogenicity within normal limits. No mass or hydronephrosis visualized. Bladder: There is a focal area of asymmetric wall thickening involving the right posterior bladder base. This measures 3.2 cm. On the color Doppler images there is only mild peripheral blood flow noted associated with this area. Other: None. IMPRESSION: 1. No hydronephrosis identified. 2. Bilateral renal cortical thinning. 3. Focal asymmetric wall thickening involving the right posterior bladder base. Cannot rule out bladder neoplasm. Recommend further evaluation with hematuria protocol CT of the abdomen and pelvis without and with contrast material versus direct visualization with cystoscopy. Electronically Signed   By: Kerby Moors M.D.   On: 05/16/2020 11:15   DG Chest Portable 1 View  Result Date: 05/10/2020 CLINICAL DATA:  Weakness fall EXAM: PORTABLE CHEST 1 VIEW COMPARISON:  October 03, 2018 FINDINGS: The cardiomediastinal silhouette is unchanged and enlarged in contour.Tortuous thoracic aorta. No pleural effusion. No pneumothorax. No acute pleuroparenchymal abnormality. Visualized abdomen is unremarkable. No acute displaced rib fracture visualized. IMPRESSION: 1. No acute cardiopulmonary abnormality. Electronically Signed   By: Valentino Saxon MD   On: 05/10/2020 15:21   ECHOCARDIOGRAM COMPLETE  Result Date: 05/18/2020    ECHOCARDIOGRAM REPORT   Patient Name:   Erik Morales Date of Exam: 05/18/2020 Medical Rec #:  [id number]     Height:       68.0 in Accession #:    [id number]    Weight:       212.1 lb Date of Birth:  [birth year]      BSA:          2.095 m Patient Age:    63 years      BP:  115/47 mmHg Patient Gender: M             HR:           51 bpm. Exam Location:  Inpatient Procedure: 2D Echo and Intracardiac Opacification Agent Indications:    Atrial Fibrillation I48.91  History:        Patient has prior history of Echocardiogram examinations, most                  recent 09/26/2018. Risk Factors:Hypertension, Diabetes and                 Dyslipidemia.  Sonographer:    Mikki Santee RDCS (AE) Referring Phys: [place]  1. Left ventricular ejection fraction, by estimation, is 60 to 65%. The left ventricle has normal function. The left ventricle has no regional wall motion abnormalities. Left ventricular diastolic parameters are consistent with Grade I diastolic dysfunction (impaired relaxation).  2. Right ventricular systolic function is normal. The right ventricular size is normal.  3. Right atrial size was moderately dilated.  4. The mitral valve is normal in structure. Trivial mitral valve regurgitation. No evidence of mitral stenosis.  5. The aortic valve is normal in structure. Aortic valve regurgitation is not visualized. No aortic stenosis is present.  6. Pulmonic valve regurgitation is moderate.  7. The inferior vena cava is normal in size with greater than 50% respiratory variability, suggesting right atrial pressure of 3 mmHg. FINDINGS  Left Ventricle: Left ventricular ejection fraction, by estimation, is 60 to 65%. The left ventricle has normal function. The left ventricle has no regional wall motion abnormalities. The left ventricular internal cavity size was normal in size. There is  no left ventricular hypertrophy. Left ventricular diastolic parameters are consistent with Grade I diastolic dysfunction (impaired relaxation). Right Ventricle: The right ventricular size is normal. No increase in right ventricular wall thickness. Right ventricular systolic function is normal. Left Atrium: Left atrial size was normal in size. Right Atrium: Right atrial size was moderately dilated. Pericardium: There is no evidence of pericardial effusion. Mitral Valve: The mitral valve is normal in structure. Trivial mitral valve regurgitation. No evidence of mitral valve stenosis. Tricuspid Valve: The tricuspid valve is normal in structure. Tricuspid valve  regurgitation is not demonstrated. No evidence of tricuspid stenosis. Aortic Valve: The aortic valve is normal in structure. Aortic valve regurgitation is not visualized. No aortic stenosis is present. Pulmonic Valve: The pulmonic valve was normal in structure. Pulmonic valve regurgitation is moderate. No evidence of pulmonic stenosis. Aorta: The aortic root is normal in size and structure. Venous: The inferior vena cava is normal in size with greater than 50% respiratory variability, suggesting right atrial pressure of 3 mmHg. IAS/Shunts: No atrial level shunt detected by color flow Doppler.  LEFT VENTRICLE PLAX 2D LVIDd:         4.52 cm  Diastology LVIDs:         2.86 cm  LV e' medial:    5.55 cm/s LV PW:         1.05 cm  LV E/e' medial:  15.2 LV IVS:        1.03 cm  LV e' lateral:   10.70 cm/s LVOT diam:     2.30 cm  LV E/e' lateral: 7.9 LV SV:         104 LV SV Index:   50 LVOT Area:     4.15 cm  RIGHT VENTRICLE RV S prime:  18.90 cm/s TAPSE (M-mode): 1.6 cm LEFT ATRIUM             Index       RIGHT ATRIUM           Index LA diam:        3.60 cm 1.72 cm/m  RA Area:     26.70 cm LA Vol (A2C):   66.9 ml 31.93 ml/m RA Volume:   83.80 ml  39.99 ml/m LA Vol (A4C):   66.7 ml 31.83 ml/m LA Biplane Vol: 68.2 ml 32.55 ml/m  AORTIC VALVE LVOT Vmax:   110.00 cm/s LVOT Vmean:  68.600 cm/s LVOT VTI:    0.251 m  AORTA Ao Root diam: 3.60 cm MITRAL VALVE MV Area (PHT): 2.66 cm     SHUNTS MV Decel Time: 285 msec     Systemic VTI:  0.25 m MV E velocity: 84.40 cm/s   Systemic Diam: 2.30 cm MV A velocity: 106.00 cm/s MV E/A ratio:  0.80 Candee Furbish MD Electronically signed by Candee Furbish MD Signature Date/Time: 05/18/2020/12:42:02 PM    Final    VAS [place] LOWER EXTREMITY VENOUS (DVT) (ONLY [facility] & [facility])  Result Date: 05/10/2020  Lower Venous DVTStudy Indications: Edema.  Comparison Study: no prior Performing Technologist: Abram Sander RVS  Examination Guidelines: A complete evaluation includes B-mode imaging, spectral  Doppler, color Doppler, and power Doppler as needed of all accessible portions of each vessel. Bilateral testing is considered an integral part of a complete examination. Limited examinations for reoccurring indications may be performed as noted. The reflux portion of the exam is performed with the patient in reverse Trendelenburg.  +-----+---------------+---------+-----------+----------+--------------+ RIGHTCompressibilityPhasicitySpontaneityPropertiesThrombus Aging +-----+---------------+---------+-----------+----------+--------------+ CFV  Full           Yes      Yes                                 +-----+---------------+---------+-----------+----------+--------------+   +---------+---------------+---------+-----------+----------+-----------------+ LEFT     CompressibilityPhasicitySpontaneityPropertiesThrombus Aging    +---------+---------------+---------+-----------+----------+-----------------+ CFV      Partial        No       No                   Age Indeterminate +---------+---------------+---------+-----------+----------+-----------------+ SFJ      Partial                                      Age Indeterminate +---------+---------------+---------+-----------+----------+-----------------+ FV Prox  Partial                                      Age Indeterminate +---------+---------------+---------+-----------+----------+-----------------+ FV Mid   Partial                                      Age Indeterminate +---------+---------------+---------+-----------+----------+-----------------+ FV DistalPartial                                      Age Indeterminate +---------+---------------+---------+-----------+----------+-----------------+ PFV      Partial  Age Indeterminate +---------+---------------+---------+-----------+----------+-----------------+ POP      Partial        Yes      Yes                  Age  Indeterminate +---------+---------------+---------+-----------+----------+-----------------+ PTV      None                                         Age Indeterminate +---------+---------------+---------+-----------+----------+-----------------+ PERO                                                  Not visualized    +---------+---------------+---------+-----------+----------+-----------------+ SSV      None                                         Age Indeterminate +---------+---------------+---------+-----------+----------+-----------------+ EIV      Partial        Yes      Yes                                    +---------+---------------+---------+-----------+----------+-----------------+ common iliac vein & ivc are unable to visualize due to bowel gas    Summary: LEFT: - Findings consistent with age indeterminate deep vein thrombosis involving the left common femoral vein, SF junction, left femoral vein, left proximal profunda vein, left popliteal vein, left posterior tibial veins, and left soleal veins. - No cystic structure found in the popliteal fossa.  RIGHT: - No evidence of common femoral vein obstruction.  *See table(s) above for measurements and observations. Electronically signed by Deitra Mayo MD on 05/10/2020 at 4:44:01 PM. Report was modified by Deitra Mayo MD on 05/10/2020 6:45:04 PM due to incorrect side on initial report.    Final (Updated)     Microbiology: Recent Results (from the past 240 hour(s))  SARS Coronavirus 2 by RT PCR (hospital order, performed in [facility] hospital lab) Nasopharyngeal Nasopharyngeal Swab     Status: None   Collection Time: 05/10/20  8:17 PM   Specimen: Nasopharyngeal Swab  Result Value Ref Range Status   SARS Coronavirus 2 NEGATIVE NEGATIVE Final    Comment: (NOTE) SARS-CoV-2 target nucleic acids are NOT DETECTED.  The SARS-CoV-2 RNA is generally detectable in upper and lower respiratory specimens during the acute  phase of infection. The lowest concentration of SARS-CoV-2 viral copies this assay can detect is 250 copies / mL. A negative result does not preclude SARS-CoV-2 infection and should not be used as the sole basis for treatment or other patient management decisions.  A negative result may occur with improper specimen collection / handling, submission of specimen other than nasopharyngeal swab, presence of viral mutation(s) within the areas targeted by this assay, and inadequate number of viral copies (<250 copies / mL). A negative result must be combined with clinical observations, patient history, and epidemiological information.  Fact Sheet for Patients:   StrictlyIdeas.no  Fact Sheet for Healthcare Providers: BankingDealers.co.za  This test is not yet approved or  cleared by the [place] FDA and has been authorized for detection and/or diagnosis of  SARS-CoV-2 by FDA under an Emergency Use Authorization (EUA).  This EUA will remain in effect (meaning this test can be used) for the duration of the COVID-19 declaration under Section 564(b)(1) of the Act, 21 U.S.C. section 360bbb-3(b)(1), unless the authorization is terminated or revoked sooner.  Performed at [facility], [place] [address], [place], [place] [address]   Urine culture     Status: Abnormal   Collection Time: 05/10/20 10:16 PM   Specimen: Urine, Random  Result Value Ref Range Status   Specimen Description URINE, RANDOM  Final   Special Requests NONE  Final   Culture (A)  Final    <10,000 COLONIES/mL INSIGNIFICANT GROWTH Performed at [facility], [place] [address], [place], [place] [address]    Report Status 05/11/2020 FINAL  Final  Culture, blood (x 2)     Status: None (Preliminary result)   Collection Time: 05/16/20 11:00 AM   Specimen: BLOOD RIGHT HAND  Result Value Ref Range Status   Specimen Description BLOOD RIGHT HAND  Final   Special Requests    Final    BOTTLES DRAWN AEROBIC ONLY Blood Culture results may not be optimal due to an inadequate volume of blood received in culture bottles   Culture   Final    NO GROWTH 4 DAYS Performed at [facility], [place] [address], [place], [place] [address]    Report Status PENDING  Incomplete  Culture, blood (x 2)     Status: None (Preliminary result)   Collection Time: 05/16/20 11:58 AM   Specimen: BLOOD RIGHT HAND  Result Value Ref Range Status   Specimen Description BLOOD RIGHT HAND  Final   Special Requests   Final    BOTTLES DRAWN AEROBIC ONLY Blood Culture adequate volume   Culture   Final    NO GROWTH 4 DAYS Performed at [facility], [place] [address] [place], [place] [address]    Report Status PENDING  Incomplete  Culture, Urine     Status: Abnormal   Collection Time: 05/16/20 12:23 PM   Specimen: Urine, Random  Result Value Ref Range Status   Specimen Description URINE, RANDOM  Final   Special Requests   Final    NONE Performed at [facility], [place] [address] [place], [place] [address]    Culture >=100,000 COLONIES/mL PROTEUS MIRABILIS (A)  Final   Report Status 05/18/2020 FINAL  Final   Organism ID, Bacteria PROTEUS MIRABILIS (A)  Final      Susceptibility   Proteus mirabilis - MIC*    AMPICILLIN <=2 SENSITIVE Sensitive     CEFAZOLIN <=4 SENSITIVE Sensitive     CEFTRIAXONE <=0.25 SENSITIVE Sensitive     CIPROFLOXACIN <=0.25 SENSITIVE Sensitive     GENTAMICIN <=1 SENSITIVE Sensitive     IMIPENEM 2 SENSITIVE Sensitive     NITROFURANTOIN 128 RESISTANT Resistant     TRIMETH/SULFA <=20 SENSITIVE Sensitive     AMPICILLIN/SULBACTAM <=2 SENSITIVE Sensitive     PIP/TAZO <=4 SENSITIVE Sensitive     * >=100,000 COLONIES/mL PROTEUS MIRABILIS     Labs: Basic Metabolic Panel: Recent Labs  Lab 05/16/20 0433 05/17/20 0417 05/18/20 1220 05/19/20 0912 05/20/20 1019  NA 141 139 141 140 143  K 3.9 3.7 3.2* 3.6 3.4*  CL 112* 112* 112* 109 111  CO2 17* 14* 20*  19* 20*  GLUCOSE 166* 144* 105* 144* 116*  BUN 42* 56* 42* 35* 25*  CREATININE 2.85* 3.36* 2.24* 1.81* 1.42*  CALCIUM 8.7* 8.3* 8.2* 8.2* 8.2*  MG  --  2.0  --   --   --   PHOS  --   --   --   --  2.5   Liver Function Tests: Recent Labs  Lab 05/14/20 0541 05/14/20 0541 05/15/20 0526 05/16/20 0433 05/17/20 0417 05/18/20 1220 05/20/20 1019  AST 26  --  24 16 18 16   --   ALT 23  --  23 21 18 16   --   ALKPHOS 74  --  78 75 70 71  --   BILITOT 1.0  --  1.1 1.0 0.7 0.4  --   PROT 6.0*  --  6.2* 6.2* 5.5* 5.2*  --   ALBUMIN 3.1*   < > 3.1* 2.8* 2.4* 2.2* 2.3*   < > = values in this interval not displayed.   No results for input(s): LIPASE, AMYLASE in the last 168 hours. No results for input(s): AMMONIA in the last 168 hours. CBC: Recent Labs  Lab 05/15/20 0526 05/16/20 0433 05/17/20 0417 05/18/20 1220 05/20/20 1019  WBC 19.7* 32.6* 29.9* 19.2* 18.9*  NEUTROABS  --   --   --   --  12.6*  HGB 12.2* 12.1* 11.2* 10.1* 10.9*  HCT 37.6* 37.4* 34.5* 31.5* 33.2*  MCV 93.5 94.0 94.3 93.8 93.3  PLT 253 251 247 243 255   Cardiac Enzymes: No results for input(s): CKTOTAL, CKMB, CKMBINDEX, TROPONINI in the last 168 hours. BNP: BNP (last 3 results) No results for input(s): BNP in the last 8760 hours.  ProBNP (last 3 results) No results for input(s): PROBNP in the last 8760 hours.  CBG: Recent Labs  Lab 05/19/20 1143 05/19/20 1633 05/19/20 2051 05/20/20 0608 05/20/20 1104  GLUCAP 119* 122* 136* 103* 115*       Signed:  Nita Sells MD   [facility] 05/20/2020, 12:16 PM

## 2020-05-21 LAB — CULTURE, BLOOD (ROUTINE X 2)
Culture: NO GROWTH
Culture: NO GROWTH
Special Requests: ADEQUATE

## 2020-05-26 DIAGNOSIS — I10 Essential (primary) hypertension: Secondary | ICD-10-CM | POA: Diagnosis not present

## 2020-05-26 DIAGNOSIS — I480 Paroxysmal atrial fibrillation: Secondary | ICD-10-CM | POA: Diagnosis not present

## 2020-05-26 DIAGNOSIS — E785 Hyperlipidemia, unspecified: Secondary | ICD-10-CM | POA: Diagnosis not present

## 2020-05-26 DIAGNOSIS — E1122 Type 2 diabetes mellitus with diabetic chronic kidney disease: Secondary | ICD-10-CM | POA: Diagnosis not present

## 2020-05-31 DIAGNOSIS — R2689 Other abnormalities of gait and mobility: Secondary | ICD-10-CM | POA: Diagnosis not present

## 2020-05-31 DIAGNOSIS — M6281 Muscle weakness (generalized): Secondary | ICD-10-CM | POA: Diagnosis not present

## 2020-05-31 DIAGNOSIS — M5459 Other low back pain: Secondary | ICD-10-CM | POA: Diagnosis not present

## 2020-06-02 DIAGNOSIS — N184 Chronic kidney disease, stage 4 (severe): Secondary | ICD-10-CM | POA: Diagnosis not present

## 2020-06-02 DIAGNOSIS — I82502 Chronic embolism and thrombosis of unspecified deep veins of left lower extremity: Secondary | ICD-10-CM | POA: Diagnosis not present

## 2020-06-02 DIAGNOSIS — G9340 Encephalopathy, unspecified: Secondary | ICD-10-CM | POA: Diagnosis not present

## 2020-06-02 DIAGNOSIS — I10 Essential (primary) hypertension: Secondary | ICD-10-CM | POA: Diagnosis not present

## 2020-06-02 DIAGNOSIS — I480 Paroxysmal atrial fibrillation: Secondary | ICD-10-CM | POA: Diagnosis not present

## 2020-06-03 DIAGNOSIS — G9340 Encephalopathy, unspecified: Secondary | ICD-10-CM | POA: Diagnosis not present

## 2020-06-03 DIAGNOSIS — N184 Chronic kidney disease, stage 4 (severe): Secondary | ICD-10-CM | POA: Diagnosis not present

## 2020-06-03 DIAGNOSIS — I82502 Chronic embolism and thrombosis of unspecified deep veins of left lower extremity: Secondary | ICD-10-CM | POA: Diagnosis not present

## 2020-06-03 DIAGNOSIS — M6281 Muscle weakness (generalized): Secondary | ICD-10-CM | POA: Diagnosis not present

## 2020-06-04 DIAGNOSIS — G9340 Encephalopathy, unspecified: Secondary | ICD-10-CM | POA: Diagnosis not present

## 2020-06-06 DIAGNOSIS — R5381 Other malaise: Secondary | ICD-10-CM | POA: Diagnosis not present

## 2020-06-06 DIAGNOSIS — R5383 Other fatigue: Secondary | ICD-10-CM | POA: Diagnosis not present

## 2020-06-06 DIAGNOSIS — M6281 Muscle weakness (generalized): Secondary | ICD-10-CM | POA: Diagnosis not present

## 2020-06-06 DIAGNOSIS — R627 Adult failure to thrive: Secondary | ICD-10-CM | POA: Diagnosis not present

## 2020-06-07 DIAGNOSIS — R627 Adult failure to thrive: Secondary | ICD-10-CM | POA: Diagnosis not present

## 2020-06-07 DIAGNOSIS — N390 Urinary tract infection, site not specified: Secondary | ICD-10-CM | POA: Diagnosis not present

## 2020-06-09 DIAGNOSIS — N390 Urinary tract infection, site not specified: Secondary | ICD-10-CM | POA: Diagnosis not present

## 2020-06-09 DIAGNOSIS — I10 Essential (primary) hypertension: Secondary | ICD-10-CM | POA: Diagnosis not present

## 2020-06-09 DIAGNOSIS — E119 Type 2 diabetes mellitus without complications: Secondary | ICD-10-CM | POA: Diagnosis not present

## 2020-06-09 DIAGNOSIS — R627 Adult failure to thrive: Secondary | ICD-10-CM | POA: Diagnosis not present

## 2020-06-10 DIAGNOSIS — D72829 Elevated white blood cell count, unspecified: Secondary | ICD-10-CM | POA: Diagnosis not present

## 2020-06-10 DIAGNOSIS — R451 Restlessness and agitation: Secondary | ICD-10-CM | POA: Diagnosis not present

## 2020-06-10 DIAGNOSIS — N390 Urinary tract infection, site not specified: Secondary | ICD-10-CM | POA: Diagnosis not present

## 2020-06-14 DIAGNOSIS — R339 Retention of urine, unspecified: Secondary | ICD-10-CM | POA: Diagnosis not present

## 2020-06-15 DIAGNOSIS — R451 Restlessness and agitation: Secondary | ICD-10-CM | POA: Diagnosis not present

## 2020-06-15 DIAGNOSIS — D72829 Elevated white blood cell count, unspecified: Secondary | ICD-10-CM | POA: Diagnosis not present

## 2020-06-15 DIAGNOSIS — N390 Urinary tract infection, site not specified: Secondary | ICD-10-CM | POA: Diagnosis not present

## 2020-06-16 DIAGNOSIS — N390 Urinary tract infection, site not specified: Secondary | ICD-10-CM | POA: Diagnosis not present

## 2020-06-16 DIAGNOSIS — R451 Restlessness and agitation: Secondary | ICD-10-CM | POA: Diagnosis not present

## 2020-06-16 DIAGNOSIS — R5383 Other fatigue: Secondary | ICD-10-CM | POA: Diagnosis not present

## 2020-06-17 ENCOUNTER — Inpatient Hospital Stay (HOSPITAL_COMMUNITY)
Admission: EM | Admit: 2020-06-17 | Discharge: 2020-06-22 | DRG: 698 | Disposition: A | Discharge status: Skilled Nursing Facility | Payer: Medicare Other | Source: Skilled Nursing Facility | Attending: Internal Medicine | Admitting: Internal Medicine

## 2020-06-17 ENCOUNTER — Inpatient Hospital Stay (HOSPITAL_COMMUNITY): Payer: Medicare Other

## 2020-06-17 ENCOUNTER — Emergency Department (HOSPITAL_COMMUNITY): Payer: Medicare Other

## 2020-06-17 DIAGNOSIS — R1310 Dysphagia, unspecified: Secondary | ICD-10-CM | POA: Diagnosis present

## 2020-06-17 DIAGNOSIS — Z833 Family history of diabetes mellitus: Secondary | ICD-10-CM | POA: Diagnosis not present

## 2020-06-17 DIAGNOSIS — I499 Cardiac arrhythmia, unspecified: Secondary | ICD-10-CM | POA: Diagnosis not present

## 2020-06-17 DIAGNOSIS — G9341 Metabolic encephalopathy: Secondary | ICD-10-CM | POA: Diagnosis not present

## 2020-06-17 DIAGNOSIS — E785 Hyperlipidemia, unspecified: Secondary | ICD-10-CM | POA: Diagnosis present

## 2020-06-17 DIAGNOSIS — N179 Acute kidney failure, unspecified: Secondary | ICD-10-CM | POA: Diagnosis not present

## 2020-06-17 DIAGNOSIS — G9340 Encephalopathy, unspecified: Secondary | ICD-10-CM | POA: Diagnosis not present

## 2020-06-17 DIAGNOSIS — E1122 Type 2 diabetes mellitus with diabetic chronic kidney disease: Secondary | ICD-10-CM | POA: Diagnosis not present

## 2020-06-17 DIAGNOSIS — Z7984 Long term (current) use of oral hypoglycemic drugs: Secondary | ICD-10-CM | POA: Diagnosis not present

## 2020-06-17 DIAGNOSIS — I4891 Unspecified atrial fibrillation: Secondary | ICD-10-CM | POA: Diagnosis not present

## 2020-06-17 DIAGNOSIS — N184 Chronic kidney disease, stage 4 (severe): Secondary | ICD-10-CM | POA: Diagnosis not present

## 2020-06-17 DIAGNOSIS — A419 Sepsis, unspecified organism: Secondary | ICD-10-CM | POA: Diagnosis not present

## 2020-06-17 DIAGNOSIS — Y846 Urinary catheterization as the cause of abnormal reaction of the patient, or of later complication, without mention of misadventure at the time of the procedure: Secondary | ICD-10-CM | POA: Diagnosis present

## 2020-06-17 DIAGNOSIS — I130 Hypertensive heart and chronic kidney disease with heart failure and stage 1 through stage 4 chronic kidney disease, or unspecified chronic kidney disease: Secondary | ICD-10-CM | POA: Diagnosis not present

## 2020-06-17 DIAGNOSIS — R000 Tachycardia, unspecified: Secondary | ICD-10-CM | POA: Diagnosis not present

## 2020-06-17 DIAGNOSIS — B377 Candidal sepsis: Secondary | ICD-10-CM | POA: Diagnosis not present

## 2020-06-17 DIAGNOSIS — R6520 Severe sepsis without septic shock: Secondary | ICD-10-CM | POA: Diagnosis not present

## 2020-06-17 DIAGNOSIS — N390 Urinary tract infection, site not specified: Secondary | ICD-10-CM | POA: Diagnosis not present

## 2020-06-17 DIAGNOSIS — Z20822 Contact with and (suspected) exposure to covid-19: Secondary | ICD-10-CM | POA: Diagnosis present

## 2020-06-17 DIAGNOSIS — E875 Hyperkalemia: Secondary | ICD-10-CM | POA: Diagnosis present

## 2020-06-17 DIAGNOSIS — E872 Acidosis: Secondary | ICD-10-CM | POA: Diagnosis not present

## 2020-06-17 DIAGNOSIS — E870 Hyperosmolality and hypernatremia: Secondary | ICD-10-CM | POA: Diagnosis not present

## 2020-06-17 DIAGNOSIS — Z743 Need for continuous supervision: Secondary | ICD-10-CM | POA: Diagnosis not present

## 2020-06-17 DIAGNOSIS — T83511A Infection and inflammatory reaction due to indwelling urethral catheter, initial encounter: Secondary | ICD-10-CM | POA: Diagnosis not present

## 2020-06-17 DIAGNOSIS — Z7401 Bed confinement status: Secondary | ICD-10-CM | POA: Diagnosis not present

## 2020-06-17 DIAGNOSIS — N138 Other obstructive and reflux uropathy: Secondary | ICD-10-CM | POA: Diagnosis not present

## 2020-06-17 DIAGNOSIS — R404 Transient alteration of awareness: Secondary | ICD-10-CM | POA: Diagnosis not present

## 2020-06-17 DIAGNOSIS — R6889 Other general symptoms and signs: Secondary | ICD-10-CM | POA: Diagnosis not present

## 2020-06-17 DIAGNOSIS — I5032 Chronic diastolic (congestive) heart failure: Secondary | ICD-10-CM | POA: Diagnosis present

## 2020-06-17 DIAGNOSIS — Y92129 Unspecified place in nursing home as the place of occurrence of the external cause: Secondary | ICD-10-CM | POA: Diagnosis not present

## 2020-06-17 DIAGNOSIS — E876 Hypokalemia: Secondary | ICD-10-CM | POA: Diagnosis not present

## 2020-06-17 DIAGNOSIS — M2550 Pain in unspecified joint: Secondary | ICD-10-CM | POA: Diagnosis not present

## 2020-06-17 DIAGNOSIS — N189 Chronic kidney disease, unspecified: Secondary | ICD-10-CM

## 2020-06-17 DIAGNOSIS — E860 Dehydration: Secondary | ICD-10-CM | POA: Diagnosis present

## 2020-06-17 DIAGNOSIS — N1832 Chronic kidney disease, stage 3b: Secondary | ICD-10-CM | POA: Diagnosis not present

## 2020-06-17 DIAGNOSIS — N401 Enlarged prostate with lower urinary tract symptoms: Secondary | ICD-10-CM | POA: Diagnosis present

## 2020-06-17 DIAGNOSIS — M6281 Muscle weakness (generalized): Secondary | ICD-10-CM | POA: Diagnosis not present

## 2020-06-17 DIAGNOSIS — F32A Depression, unspecified: Secondary | ICD-10-CM | POA: Diagnosis not present

## 2020-06-17 DIAGNOSIS — Z86718 Personal history of other venous thrombosis and embolism: Secondary | ICD-10-CM

## 2020-06-17 DIAGNOSIS — R410 Disorientation, unspecified: Secondary | ICD-10-CM | POA: Diagnosis not present

## 2020-06-17 DIAGNOSIS — Z79899 Other long term (current) drug therapy: Secondary | ICD-10-CM

## 2020-06-17 DIAGNOSIS — M6259 Muscle wasting and atrophy, not elsewhere classified, multiple sites: Secondary | ICD-10-CM | POA: Diagnosis not present

## 2020-06-17 DIAGNOSIS — I480 Paroxysmal atrial fibrillation: Secondary | ICD-10-CM | POA: Diagnosis present

## 2020-06-17 DIAGNOSIS — R4182 Altered mental status, unspecified: Secondary | ICD-10-CM

## 2020-06-17 DIAGNOSIS — R1311 Dysphagia, oral phase: Secondary | ICD-10-CM | POA: Diagnosis not present

## 2020-06-17 DIAGNOSIS — R531 Weakness: Secondary | ICD-10-CM | POA: Diagnosis not present

## 2020-06-17 LAB — COMPREHENSIVE METABOLIC PANEL
ALT: 55 U/L — ABNORMAL HIGH (ref 0–44)
ALT: 48 U/L — ABNORMAL HIGH (ref 0–44)
AST: 44 U/L — ABNORMAL HIGH (ref 15–41)
AST: 37 U/L (ref 15–41)
Albumin: 3.1 g/dL — ABNORMAL LOW (ref 3.5–5.0)
Albumin: 2.4 g/dL — ABNORMAL LOW (ref 3.5–5.0)
Alkaline Phosphatase: 156 U/L — ABNORMAL HIGH (ref 38–126)
Alkaline Phosphatase: 126 U/L (ref 38–126)
Anion gap: 17 — ABNORMAL HIGH (ref 5–15)
Anion gap: 13 (ref 5–15)
BUN: 81 mg/dL — ABNORMAL HIGH (ref 8–23)
BUN: 78 mg/dL — ABNORMAL HIGH (ref 8–23)
CO2: 23 mmol/L (ref 22–32)
CO2: 26 mmol/L (ref 22–32)
Calcium: 9.3 mg/dL (ref 8.9–10.3)
Calcium: 8.7 mg/dL — ABNORMAL LOW (ref 8.9–10.3)
Chloride: 109 mmol/L (ref 98–111)
Chloride: 110 mmol/L (ref 98–111)
Creatinine, Ser: 3.56 mg/dL — ABNORMAL HIGH (ref 0.61–1.24)
Creatinine, Ser: 3.4 mg/dL — ABNORMAL HIGH (ref 0.61–1.24)
GFR, Estimated: 16 mL/min — ABNORMAL LOW (ref >60)
GFR, Estimated: 17 mL/min — ABNORMAL LOW (ref >60)
Glucose, Bld: 179 mg/dL — ABNORMAL HIGH (ref 70–99)
Glucose, Bld: 165 mg/dL — ABNORMAL HIGH (ref 70–99)
Potassium: 5.3 mmol/L — ABNORMAL HIGH (ref 3.5–5.1)
Potassium: 4 mmol/L (ref 3.5–5.1)
Sodium: 149 mmol/L — ABNORMAL HIGH (ref 135–145)
Sodium: 149 mmol/L — ABNORMAL HIGH (ref 135–145)
Total Bilirubin: 1.6 mg/dL — ABNORMAL HIGH (ref 0.3–1.2)
Total Bilirubin: 0.9 mg/dL (ref 0.3–1.2)
Total Protein: 7.1 g/dL (ref 6.5–8.1)
Total Protein: 5.7 g/dL — ABNORMAL LOW (ref 6.5–8.1)

## 2020-06-17 LAB — URINALYSIS, ROUTINE W REFLEX MICROSCOPIC
Bilirubin Urine: NEGATIVE
Glucose, UA: NEGATIVE mg/dL
Ketones, ur: NEGATIVE mg/dL
Nitrite: NEGATIVE
Protein, ur: 30 mg/dL — AB
Specific Gravity, Urine: 1.016 (ref 1.005–1.030)
WBC, UA: >50 WBC/hpf — ABNORMAL HIGH (ref 0–5)
pH: 5 (ref 5.0–8.0)

## 2020-06-17 LAB — LACTIC ACID, PLASMA
Lactic Acid, Venous: 2.8 mmol/L (ref 0.5–1.9)
Lactic Acid, Venous: 2.2 mmol/L (ref 0.5–1.9)

## 2020-06-17 LAB — CBC
HCT: 38 % — ABNORMAL LOW (ref 39.0–52.0)
Hemoglobin: 11.4 g/dL — ABNORMAL LOW (ref 13.0–17.0)
MCH: 29.2 pg (ref 26.0–34.0)
MCHC: 30 g/dL (ref 30.0–36.0)
MCV: 97.4 fL (ref 80.0–100.0)
Platelets: 219 10*3/uL (ref 150–400)
RBC: 3.9 MIL/uL — ABNORMAL LOW (ref 4.22–5.81)
RDW: 13.5 % (ref 11.5–15.5)
WBC: 31.3 10*3/uL — ABNORMAL HIGH (ref 4.0–10.5)
nRBC: 0 % (ref 0.0–0.2)

## 2020-06-17 LAB — CBC WITH DIFFERENTIAL/PLATELET
Abs Immature Granulocytes: 0 10*3/uL (ref 0.00–0.07)
Basophils Absolute: 0 10*3/uL (ref 0.0–0.1)
Basophils Relative: 0 %
Eosinophils Absolute: 0 10*3/uL (ref 0.0–0.5)
Eosinophils Relative: 0 %
HCT: 45.7 % (ref 39.0–52.0)
Hemoglobin: 13.7 g/dL (ref 13.0–17.0)
Lymphocytes Relative: 10 %
Lymphs Abs: 3.8 10*3/uL (ref 0.7–4.0)
MCH: 29 pg (ref 26.0–34.0)
MCHC: 30 g/dL (ref 30.0–36.0)
MCV: 96.8 fL (ref 80.0–100.0)
Monocytes Absolute: 1.9 10*3/uL — ABNORMAL HIGH (ref 0.1–1.0)
Monocytes Relative: 5 %
Neutro Abs: 32.6 10*3/uL — ABNORMAL HIGH (ref 1.7–7.7)
Neutrophils Relative %: 85 %
Platelets: 280 10*3/uL (ref 150–400)
RBC: 4.72 MIL/uL (ref 4.22–5.81)
RDW: 13.6 % (ref 11.5–15.5)
WBC: 38.4 10*3/uL — ABNORMAL HIGH (ref 4.0–10.5)
nRBC: 0 % (ref 0.0–0.2)
nRBC: 0 /100 WBC

## 2020-06-17 LAB — HEMOGLOBIN A1C
Hgb A1c MFr Bld: 5.9 % — ABNORMAL HIGH (ref 4.8–5.6)
Mean Plasma Glucose: 123 mg/dL

## 2020-06-17 LAB — CREATININE, URINE, RANDOM: Creatinine, Urine: 142.97 mg/dL

## 2020-06-17 LAB — RESPIRATORY PANEL BY RT PCR (FLU A&B, COVID)
Influenza A by PCR: NEGATIVE
Influenza B by PCR: NEGATIVE
SARS Coronavirus 2 by RT PCR: NEGATIVE

## 2020-06-17 LAB — CBG MONITORING, ED
Glucose-Capillary: 119 mg/dL — ABNORMAL HIGH (ref 70–99)
Glucose-Capillary: 117 mg/dL — ABNORMAL HIGH (ref 70–99)

## 2020-06-17 LAB — SODIUM, URINE, RANDOM: Sodium, Ur: 30 mmol/L

## 2020-06-17 LAB — GLUCOSE, CAPILLARY
Glucose-Capillary: 90 mg/dL (ref 70–99)
Glucose-Capillary: 98 mg/dL (ref 70–99)
Glucose-Capillary: 94 mg/dL (ref 70–99)

## 2020-06-17 MED ORDER — LACTATED RINGERS IV BOLUS (SEPSIS)
2000.0000 mL | Freq: Once | INTRAVENOUS | Status: AC
  Administered 2020-06-17 (×2): 2000 mL via INTRAVENOUS

## 2020-06-17 MED ORDER — VANCOMYCIN HCL IN DEXTROSE 1-5 GM/200ML-% IV SOLN: 1000.0000 mg | Freq: Once | INTRAVENOUS | Status: DC

## 2020-06-17 MED ORDER — INSULIN ASPART 100 UNIT/ML ~~LOC~~ SOLN: 0.0000 [IU] | Freq: Every day | SUBCUTANEOUS | Status: DC

## 2020-06-17 MED ORDER — CHLORHEXIDINE GLUCONATE CLOTH 2 % EX PADS
6.0000 | MEDICATED_PAD | Freq: Every day | CUTANEOUS | Status: DC
  Administered 2020-06-17 – 2020-06-22 (×4): 6 via TOPICAL

## 2020-06-17 MED ORDER — LACTATED RINGERS IV SOLN: INTRAVENOUS | Status: AC

## 2020-06-17 MED ORDER — VANCOMYCIN HCL 2000 MG/400ML IV SOLN
2000.0000 mg | Freq: Once | INTRAVENOUS | Status: DC
  Filled 2020-06-17: qty 400

## 2020-06-17 MED ORDER — HEPARIN SODIUM (PORCINE) 5000 UNIT/ML IJ SOLN
5000.0000 [IU] | Freq: Three times a day (TID) | INTRAMUSCULAR | Status: DC
  Administered 2020-06-17 – 2020-06-22 (×17): 5000 [IU] via SUBCUTANEOUS
  Filled 2020-06-17 (×17): qty 1

## 2020-06-17 MED ORDER — SODIUM CHLORIDE 0.9 % IV SOLN
2.0000 g | Freq: Once | INTRAVENOUS | Status: DC
  Filled 2020-06-17: qty 2

## 2020-06-17 MED ORDER — LACTATED RINGERS IV BOLUS
1000.0000 mL | Freq: Once | INTRAVENOUS | Status: AC
  Administered 2020-06-17: 1000 mL via INTRAVENOUS

## 2020-06-17 MED ORDER — MONTELUKAST SODIUM 10 MG PO TABS: 10.0000 mg | ORAL_TABLET | Freq: Every evening | ORAL | Status: DC | PRN

## 2020-06-17 MED ORDER — SERTRALINE HCL 50 MG PO TABS
25.0000 mg | ORAL_TABLET | Freq: Every day | ORAL | Status: DC
  Administered 2020-06-17 – 2020-06-22 (×6): 25 mg via ORAL
  Filled 2020-06-17 (×6): qty 1

## 2020-06-17 MED ORDER — VANCOMYCIN VARIABLE DOSE PER UNSTABLE RENAL FUNCTION (PHARMACIST DOSING): Status: DC

## 2020-06-17 MED ORDER — LATANOPROST 0.005 % OP SOLN
1.0000 [drp] | Freq: Every day | OPHTHALMIC | Status: DC
  Administered 2020-06-17 – 2020-06-21 (×5): 1 [drp] via OPHTHALMIC
  Filled 2020-06-17: qty 2.5

## 2020-06-17 MED ORDER — SODIUM CHLORIDE 0.9 % IV SOLN
2.0000 g | INTRAVENOUS | Status: DC
  Administered 2020-06-17 – 2020-06-20 (×4): 2 g via INTRAVENOUS
  Filled 2020-06-17 (×3): qty 2

## 2020-06-17 MED ORDER — METRONIDAZOLE IN NACL 5-0.79 MG/ML-% IV SOLN: 500.0000 mg | Freq: Once | INTRAVENOUS | Status: DC

## 2020-06-17 MED ORDER — INSULIN ASPART 100 UNIT/ML ~~LOC~~ SOLN
0.0000 [IU] | Freq: Three times a day (TID) | SUBCUTANEOUS | Status: DC
  Administered 2020-06-18: 1 [IU] via SUBCUTANEOUS
  Administered 2020-06-19 (×3): 2 [IU] via SUBCUTANEOUS
  Administered 2020-06-20 – 2020-06-22 (×4): 1 [IU] via SUBCUTANEOUS
  Administered 2020-06-22: 2 [IU] via SUBCUTANEOUS

## 2020-06-17 NOTE — ED Notes (Signed)
Date and time results received: 06/17/20 3:29 AM  Test: Lactic Acid Critical Value: 2.8  Name of Provider Notified: [place], [place]

## 2020-06-17 NOTE — ED Triage Notes (Addendum)
EMS called due to pt being unresponsive, upon their arrival, answered to his name. Upon arrival to ER, patient answers questions appropriately at times, but also rambles incomprehensibly at other times. Patient's extremities cool to touch. Nail beds pale. Skin dry. Noted to have greyish-green plaque noted to tongue.  Patient states he has not eaten in 3 days. Per EMS, facility reports patient is currently being treated for a UTI (unable to quantify how long). They found his SpO2 to be 77% so, they put him on 2L via [place], did not reassess. EMS placed pt on nonrebreather, SpO2 88% on same. Upon arrival. SPO2 obtained on room air, SPO2 98-100%. Pt noted to be in AFIB 90-115 bpm. No urine output noted to foley bag upon arrival.

## 2020-06-17 NOTE — Evaluation (Addendum)
Clinical/Bedside Swallow Evaluation Patient Details  Name: Erik Morales MRN: [id number] Date of Birth: 1940/11/19  Today's Date: 06/17/2020 Time: SLP Start Time (ACUTE ONLY): 1339 SLP Stop Time (ACUTE ONLY): 1407 SLP Time Calculation (min) (ACUTE ONLY): 28 min  Past Medical History:  Past Medical History:  Diagnosis Date  . Abnormal thyroid function test 09/25/2018  . Abnormality of gait 12/05/2018  . Acute blood loss anemia   . Acute on chronic renal failure ([place]) 10/13/2018  . AKI (acute kidney injury) ([place]) 09/25/2018  . CAP (community acquired pneumonia) 09/25/2018  . Chronic diastolic congestive heart failure ([place])   . Chronic kidney disease (CKD), stage III (moderate)   . Debility 10/02/2018  . Diabetes mellitus due to underlying condition with unspecified complications ([place]) 0/0/8676  . DM2 (diabetes mellitus, type 2) ([place])   . Duodenal ulcer with hemorrhage   . E-coli UTI 09/25/2018  . Elevated troponin 09/25/2018  . Hematochezia 09/25/2018  . History of GI bleed   . HLD (hyperlipidemia)   . HTN (hypertension)   . Hypoalbuminemia due to protein-calorie malnutrition ([place])   . Hypotension   . Leukocytosis   . Melena   . Nonsustained ventricular tachycardia ([place]) 09/04/2019  . PAF (paroxysmal atrial fibrillation) ([place]) 01/27/2019  . Pressure injury of skin 09/26/2018  . Severe sepsis ([place]) 09/25/2018   Past Surgical History:  Past Surgical History:  Procedure Laterality Date  . ESOPHAGOGASTRODUODENOSCOPY (EGD) WITH PROPOFOL N/A 09/27/2018   Procedure: ESOPHAGOGASTRODUODENOSCOPY (EGD) WITH PROPOFOL;  Surgeon: Doran Stabler, MD;  Location: [facility];  Service: Gastroenterology;  Laterality: N/A;  . HOT HEMOSTASIS N/A 09/27/2018   Procedure: HOT HEMOSTASIS (ARGON PLASMA COAGULATION/BICAP);  Surgeon: Doran Stabler, MD;  Location: [facility];  Service: Gastroenterology;  Laterality: N/A;  . SUBMUCOSAL INJECTION  09/27/2018   Procedure: SUBMUCOSAL INJECTION;  Surgeon: Doran Stabler, MD;  Location: [facility];  Service: Gastroenterology;;   HPI:  Pt is an 79 y.o. male with medical history significant of hypertension, hyperlipidemia, CKD stage IIIb-IV, chronic diastolic CHF, paroxysmal atrial fibrillation not on anticoagulation due to history of upper GI bleed secondary to duodenal ulcer, non-insulin-dependent type 2 diabetes, history of DVT who presented to the ED via EMS for evaluation of altered mental status and poor oral intake.  CXR was negative for active disease. Renal US: No acute abnormality.    Assessment / Plan / Recommendation Clinical Impression  Pt was seen for bedside swallow evaluation. He demonstrated confusion and was unable to provide any meaningful history or consistently follow commands. Vocal quality was breathy and speech intelligibility was reduced due to this and his low vocal intensity. Oral mechanism exam was limited due to pt's difficulty following commands; however, oral motor strength and ROM appeared grossly [facility] and he was edentulous. He tolerated all solids and liquids without signs or symptoms of aspiration. Mastication was prolonged and ineffective; 10 minutes were spent masticating two pieces of peaches and, upon inspection, they were unmasticated despite length of masticatory effort. A dysphagia 1 diet with thin liquids is recommended at this time and SLP will follow pt.  SLP Visit Diagnosis: Dysphagia, unspecified (R13.10)    Aspiration Risk  Mild aspiration risk    Diet Recommendation Dysphagia 1 (Puree);Thin liquid   Liquid Administration via: Cup;Straw Medication Administration: Crushed with puree Supervision: Staff to assist with self feeding Compensations: Slow rate;Small sips/bites Postural Changes: Seated upright at 90 degrees    Other  Recommendations Oral Care Recommendations: Oral care BID;Staff/trained  caregiver to provide oral care   Follow up Recommendations Other (comment) (TBD)      Frequency and Duration min  2x/week  2 weeks       Prognosis Prognosis for Safe Diet Advancement: Fair Barriers to Reach Goals: Cognitive deficits      Swallow Study   General Date of Onset: 06/16/20 HPI: Pt is an 79 y.o. male with medical history significant of hypertension, hyperlipidemia, CKD stage IIIb-IV, chronic diastolic CHF, paroxysmal atrial fibrillation not on anticoagulation due to history of upper GI bleed secondary to duodenal ulcer, non-insulin-dependent type 2 diabetes, history of DVT who presented to the ED via EMS for evaluation of altered mental status and poor oral intake.  CXR was negative for active disease. Renal US: No acute abnormality.  Type of Study: Bedside Swallow Evaluation Previous Swallow Assessment: None Diet Prior to this Study: NPO Temperature Spikes Noted: No Respiratory Status: Room air History of Recent Intubation: No Behavior/Cognition: Alert;Cooperative Oral Cavity Assessment: Within Functional Limits Oral Care Completed by SLP: Recent completion by staff Oral Cavity - Dentition: Edentulous Vision: Functional for self-feeding Patient Positioning: Upright in bed;Postural control adequate for testing Baseline Vocal Quality: Breathy;Low vocal intensity Volitional Cough: Cognitively unable to elicit Volitional Swallow: Unable to elicit    Oral/Motor/Sensory Function Overall Oral Motor/Sensory Function: Other (comment) (Pt unable to follow the necessary commands)   Ice Chips Ice chips: Within functional limits Presentation: Spoon   Thin Liquid Thin Liquid: Within functional limits Presentation: Straw    Nectar Thick Nectar Thick Liquid: Not tested   Honey Thick Honey Thick Liquid: Not tested   Puree Puree: Within functional limits Presentation: Spoon   Solid     Solid: Impaired Oral Phase Impairments: Impaired mastication Oral Phase Functional Implications: Impaired mastication     Nylene Inlow I. Hardin Negus, [place], [place] Office number  [phone] Pager [phone]  Horton Marshall 06/17/2020,2:17 PM

## 2020-06-17 NOTE — ED Notes (Signed)
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

## 2020-06-17 NOTE — Progress Notes (Addendum)
Pharmacy Antibiotic Note  Erik Morales is a 79 y.o. male admitted on 06/17/2020 with sepsis - pt currently being treated at facility for UTI.  Pharmacy has been consulted for Vancomycin and Cefepime dosing.  Pt with ARF, SCr up to 3.56 (was 1.4-1.8 about a mos ago), est CrCl 15-20 ml/min  Plan: Cefepime 2gm IV q24h Vancomycin 2000 mg IV  Will f/u renal function for further doses, micro data, and pt's clinical condition Vanc levels prn  ADDENDUM (0430) Admitting MD d/c Vancomycin - none given.      Temp (24hrs), Avg:98.9 F (37.2 C), Min:98.9 F (37.2 C), Max:98.9 F (37.2 C)  Recent Labs  Lab 06/17/20 0200  WBC 38.4*  CREATININE 3.56*  LATICACIDVEN 2.8*    CrCl cannot be calculated (Unknown ideal weight.).    No Known Allergies  Antimicrobials this admission: 10/29 Cefepime >>   Microbiology results: 10/29 BCx:  10/29 UCx:    Thank you for allowing pharmacy to be a part of this patient's care.  Sherlon Handing, PharmD, BCPS Please see amion for complete clinical pharmacist phone list 06/17/2020 3:35 AM

## 2020-06-17 NOTE — ED Notes (Signed)
ED Provider at bedside. 

## 2020-06-17 NOTE — H&P (Signed)
History and Physical    Erik Morales LXB:[id number] DOB: August 26, 1940 DOA: 06/17/2020  PCP: [name], MD Patient coming from: Nursing home  Chief Complaint: Altered mental status  HPI: Erik Morales is a 79 y.o. male with medical history significant of hypertension, hyperlipidemia, CKD stage IIIb-IV, chronic diastolic CHF, paroxysmal atrial fibrillation not on anticoagulation due to history of upper GI bleed secondary to duodenal ulcer, non-insulin-dependent type 2 diabetes, history of DVT presenting to the ED via EMS for evaluation of altered mental status and poor oral intake.  EMS was called to his facility as patient was found unresponsive.  His facility reported that patient is currently being treated for UTI.  Reportedly his oxygen saturation was 77% on room air at his facility, improved to 88% with 2 L supplemental oxygen.  EMS placed nonrebreather in route to the hospital.  Upon arrival to the ED, SPO2 98-100% on room air.  Patient noted to be in A. fib with rate 90-115.  No urine output in the Foley bag upon arrival. Patient was admitted to the hospital last month for encephalopathy secondary to UTI.  No history could be obtained from the patient as he was somnolent.  ED Course: Afebrile.  Tachycardic with heart rate up to 110s. Hypotensive with systolic as low as 41U.  Not hypoxic.  WBC 38.4 with neutrophilic predominance, hemoglobin 13.7, hematocrit 45.7, platelet 280K.  Sodium 149, potassium 5.3, chloride 109, bicarb 23, BUN 81, creatinine 3.5 (baseline 1.4), glucose 179.  AST 44, ALT 55, alk phos 156, T bili 1.6.  Lactic acid 2.8.  UA with large amount of leukocytes and greater than 50 WBCs.  Urine culture pending.  SARS-CoV-2 PCR test pending.  Influenza panel pending.  Chest x-ray not suggestive of pneumonia.  Patient was given broad-spectrum antibiotics including vancomycin, cefepime, and metronidazole.  30 cc/ Kg fluid boluses ordered per sepsis protocol.  Review of Systems:  All  systems reviewed and apart from history of presenting illness, are negative.  Past Medical History:  Diagnosis Date  . Abnormal thyroid function test 09/25/2018  . Abnormality of gait 12/05/2018  . Acute blood loss anemia   . Acute on chronic renal failure ([place]) 10/13/2018  . AKI (acute kidney injury) ([place]) 09/25/2018  . CAP (community acquired pneumonia) 09/25/2018  . Chronic diastolic congestive heart failure ([place])   . Chronic kidney disease (CKD), stage III (moderate)   . Debility 10/02/2018  . Diabetes mellitus due to underlying condition with unspecified complications ([place]) 12/21/6466  . DM2 (diabetes mellitus, type 2) ([place])   . Duodenal ulcer with hemorrhage   . E-coli UTI 09/25/2018  . Elevated troponin 09/25/2018  . Hematochezia 09/25/2018  . History of GI bleed   . HLD (hyperlipidemia)   . HTN (hypertension)   . Hypoalbuminemia due to protein-calorie malnutrition ([place])   . Hypotension   . Leukocytosis   . Melena   . Nonsustained ventricular tachycardia ([place]) 09/04/2019  . PAF (paroxysmal atrial fibrillation) ([place]) 01/27/2019  . Pressure injury of skin 09/26/2018  . Severe sepsis ([place]) 09/25/2018    Past Surgical History:  Procedure Laterality Date  . ESOPHAGOGASTRODUODENOSCOPY (EGD) WITH PROPOFOL N/A 09/27/2018   Procedure: ESOPHAGOGASTRODUODENOSCOPY (EGD) WITH PROPOFOL;  Surgeon: Doran Stabler, MD;  Location: [place];  Service: Gastroenterology;  Laterality: N/A;  . HOT HEMOSTASIS N/A 09/27/2018   Procedure: HOT HEMOSTASIS (ARGON PLASMA COAGULATION/BICAP);  Surgeon: Doran Stabler, MD;  Location: [place];  Service: Gastroenterology;  Laterality: N/A;  .  SUBMUCOSAL INJECTION  09/27/2018   Procedure: SUBMUCOSAL INJECTION;  Surgeon: Doran Stabler, MD;  Location: [facility];  Service: Gastroenterology;;     reports that he has quit smoking. His smoking use included cigarettes. He has never used smokeless tobacco. He reports that he does not drink alcohol and does not use  drugs.  No Known Allergies  Family History  Problem Relation Age of Onset  . Diabetes Son     Prior to Admission medications   Medication Sig Start Date End Date Taking? Authorizing Provider  acetaminophen (TYLENOL) 500 MG tablet Take 500 mg by mouth every 6 (six) hours as needed for headache (pain).    [provider]  albuterol (VENTOLIN HFA) 108 (90 Base) MCG/ACT inhaler Inhale 1 puff into the lungs every 6 (six) hours as needed for shortness of breath.  02/06/19   [provider]  ferrous sulfate 325 (65 FE) MG tablet Take 325 mg by mouth daily with breakfast.    [provider]  finasteride (PROSCAR) 5 MG tablet Take 1 tablet (5 mg total) by mouth daily. 05/21/20   Nita Sells, MD  fluticasone (FLONASE) 50 MCG/ACT nasal spray Place 2 sprays into both nostrils daily. 11/14/18   [provider]  glimepiride (AMARYL) 1 MG tablet Take 1 tablet (1 mg total) by mouth daily with breakfast. 10/11/18   Love, Ivan Anchors, PA-C  latanoprost (XALATAN) 0.005 % ophthalmic solution Place 1 drop into both eyes at bedtime. 10/10/18   Love, Ivan Anchors, PA-C  montelukast (SINGULAIR) 10 MG tablet Take 1 tablet (10 mg total) by mouth at bedtime as needed (allergies). 10/10/18   Love, Ivan Anchors, PA-C  pantoprazole (PROTONIX) 20 MG tablet Take 20 mg by mouth 2 (two) times daily.     [provider]  polyethylene glycol (MIRALAX / GLYCOLAX) packet Take 17 g by mouth daily as needed for mild constipation. 10/10/18   Love, Ivan Anchors, PA-C  potassium chloride (KLOR-CON) 10 MEQ tablet Take 10 mEq by mouth daily. 05/26/19   [provider]  pravastatin (PRAVACHOL) 40 MG tablet Take 0.5 tablets (20 mg total) by mouth at bedtime. 04/18/20   Revankar, Reita Cliche, MD  sulfamethoxazole-trimethoprim (BACTRIM DS) 800-160 MG tablet Take 1 tablet by mouth every 12 (twelve) hours. 05/20/20   Nita Sells, MD  tamsulosin (FLOMAX) 0.4 MG CAPS capsule Take 1 capsule (0.4 mg  total) by mouth daily after supper. 05/20/20   Nita Sells, MD  Vitamin D, Ergocalciferol, (DRISDOL) 1.25 MG (50000 UT) CAPS capsule Take 50,000 Units by mouth every 7 (seven) days.     [provider]    Physical Exam: Vitals:   06/17/20 0215 06/17/20 0230 06/17/20 0245 06/17/20 0300  BP: (!) 83/69 92/67 104/84   Pulse: (!) 47  (!) 56   Resp: (!) 23 12 15    Temp:    99.8 F (37.7 C)  TempSrc:    Rectal  SpO2: 99%  100%     Physical Exam Constitutional:      General: He is not in acute distress. HENT:     Head: Normocephalic and atraumatic.     Mouth/Throat:     Comments: Very dry mucous membranes Eyes:     Conjunctiva/sclera: Conjunctivae normal.  Cardiovascular:     Rate and Rhythm: Normal rate and regular rhythm.     Pulses: Normal pulses.  Pulmonary:     Effort: Pulmonary effort is normal. No respiratory distress.     Breath sounds: Normal  breath sounds. No wheezing or rales.  Abdominal:     General: Bowel sounds are normal. There is no distension.     Palpations: Abdomen is soft.     Tenderness: There is no abdominal tenderness.  Musculoskeletal:        General: No tenderness.     Cervical back: Normal range of motion and neck supple.     Comments: +2 pedal edema bilaterally  Skin:    General: Skin is dry.  Neurological:     Mental Status: He is alert.     Comments: Somnolent and not answering any questions.  However, moving all extremities on command.     Labs on Admission: I have personally reviewed following labs and imaging studies  CBC: Recent Labs  Lab 06/17/20 0200  WBC 38.4*  NEUTROABS 32.6*  HGB 13.7  HCT 45.7  MCV 96.8  PLT 967   Basic Metabolic Panel: Recent Labs  Lab 06/17/20 0200  NA 149*  K 5.3*  CL 109  CO2 23  GLUCOSE 179*  BUN 81*  CREATININE 3.56*  CALCIUM 9.3   GFR: CrCl cannot be calculated (Unknown ideal weight.). Liver Function Tests: Recent Labs  Lab 06/17/20 0200  AST 44*  ALT 55*  ALKPHOS  156*  BILITOT 1.6*  PROT 7.1  ALBUMIN 3.1*   No results for input(s): LIPASE, AMYLASE in the last 168 hours. No results for input(s): AMMONIA in the last 168 hours. Coagulation Profile: No results for input(s): INR, PROTIME in the last 168 hours. Cardiac Enzymes: No results for input(s): CKTOTAL, CKMB, CKMBINDEX, TROPONINI in the last 168 hours. BNP (last 3 results) No results for input(s): PROBNP in the last 8760 hours. HbA1C: No results for input(s): HGBA1C in the last 72 hours. CBG: No results for input(s): GLUCAP in the last 168 hours. Lipid Profile: No results for input(s): CHOL, HDL, LDLCALC, TRIG, CHOLHDL, LDLDIRECT in the last 72 hours. Thyroid Function Tests: No results for input(s): TSH, T4TOTAL, FREET4, T3FREE, THYROIDAB in the last 72 hours. Anemia Panel: No results for input(s): VITAMINB12, FOLATE, FERRITIN, TIBC, IRON, RETICCTPCT in the last 72 hours. Urine analysis:    Component Value Date/Time   COLORURINE AMBER (A) 06/17/2020 0200   APPEARANCEUR CLOUDY (A) 06/17/2020 0200   LABSPEC 1.016 06/17/2020 0200   PHURINE 5.0 06/17/2020 0200   GLUCOSEU NEGATIVE 06/17/2020 0200   HGBUR MODERATE (A) 06/17/2020 0200   BILIRUBINUR NEGATIVE 06/17/2020 0200   KETONESUR NEGATIVE 06/17/2020 0200   PROTEINUR 30 (A) 06/17/2020 0200   NITRITE NEGATIVE 06/17/2020 0200   LEUKOCYTESUR LARGE (A) 06/17/2020 0200    Radiological Exams on Admission: DG Chest Port 1 View  Result Date: 06/17/2020 CLINICAL DATA:  Encephalopathy EXAM: PORTABLE CHEST 1 VIEW COMPARISON:  05/10/2020 FINDINGS: The heart size and mediastinal contours are within normal limits. Both lungs are clear. The visualized skeletal structures are unremarkable. IMPRESSION: No active disease. Electronically Signed   By: Ulyses Jarred M.D.   On: 06/17/2020 02:24    EKG: No EKG done in the ED.  EKG has been ordered now and currently pending.  Assessment/Plan Principal Problem:   Catheter-associated urinary tract  infection (HCC) Active Problems:   Severe sepsis (HCC)   Acute-on-chronic kidney injury ([facility])   Acute encephalopathy   Atrial fibrillation with rapid ventricular response (HCC)   Severe sepsis secondary to CAUTI: Tachycardic and initially hypotensive with systolic as low as 89F.  WBC count 81.0 with neutrophilic predominance.  Lactic acid 2.8.  Patient has an indwelling  Foley catheter.  UA with large amount of leukocytes and greater than 50 WBCs. Urine culture from previous hospitalization grew Proteus pansensitive except nitrofurantoin. -Patient received 30 cc/ Kg fluid boluses per sepsis protocol.  Blood pressure now improved.  Continue antibiotic coverage with cefepime.  Urine culture pending.  Blood culture x2 pending.  Continue to monitor WBC count.  Trend lactate.  AKI on CKD stage IIIb-IV: Likely prerenal azotemia from severe dehydration and severe sepsis/hypotension.  BUN 81, creatinine 3.5 (baseline 1.4). -IV fluid hydration.  Monitor renal function and urine output.  Avoid nephrotoxic agents/contrast.  Order renal ultrasound.  Check urine sodium and creatinine.  Acute septic metabolic encephalopathy: Etiology of encephalopathy likely multifactorial in the setting of severe sepsis, UTI, AKI/uremia, and severe dehydration.  Unclear what his baseline mental status is.  Currently somnolent and not answering any questions.  However, able to move all extremities on command. -Continue management of severe sepsis/UTI and AKI as mentioned above.  A. fib with RVR: Not on anticoagulation due to history of GI bleed secondary to duodenal ulcer.  Patient was previously on amiodarone and beta-blocker which were discontinued during his hospitalization last month due to bradycardia.  Likely precipitating factor is underlying infection/severe sepsis. -Rate improved after IV fluid resuscitation, continue to monitor  Hypernatremia: Corrected sodium 150.  Suspect hypernatremia is due to severe  dehydration. -IV fluid hydration.  Continue to monitor BMP.  Borderline hyperkalemia: Potassium 5.3.  Likely related to home potassium supplement use. -Hold home potassium supplement and continue to monitor BMP  Mildly elevated LFTs: AST 44, ALT 55, alk phos 156, T bili 1.6.  Likely due to severe sepsis. -Continue to monitor  Hypertension -Hold antihypertensives in the setting of severe sepsis  Hyperlipidemia -Resume statin after pharmacy med rec is done  Chronic diastolic CHF: Has pedal edema but lungs clear.  Received fluid boluses for hypotension secondary to severe sepsis. -Continue to monitor  Noninsulin-dependent type 2 diabetes -Check A1c.  Sliding scale insulin sensitive ACHS and CBG checks.  Hold home oral agent.  BPH -Resume finasteride and tamsulosin after pharmacy med rec is done  DVT prophylaxis: Subcutaneous heparin Code Status: Full code Family Communication: No family available at this time. Disposition Plan: Status is: Inpatient  Remains inpatient appropriate because:IV treatments appropriate due to intensity of illness or inability to take PO and Inpatient level of care appropriate due to severity of illness   Dispo: The patient is from: Nursing home              Anticipated d/c is to: Nursing home              Anticipated d/c date is: > 3 days              Patient currently is not medically stable to d/c.  The medical decision making on this patient was of high complexity and the patient is at high risk for clinical deterioration, therefore this is a level 3 visit.  Shela Leff MD [facility]  If 7PM-7AM, please contact night-coverage [email or web address]  06/17/2020, 4:35 AM

## 2020-06-17 NOTE — ED Notes (Signed)
Pt to US.

## 2020-06-17 NOTE — ED Notes (Signed)
Previous foley removed, purulent drainage noted from urethral meatus. New foley inserted, large amount of sediment noted upon initial insertion, then amber, but clear urine stated to drain.

## 2020-06-17 NOTE — Progress Notes (Signed)
PROGRESS NOTE    Erik Morales  BHA:[id number] DOB: 1941-03-20 DOA: 06/17/2020 PCP: [name], MD    Brief Narrative:  Erik Morales male with past medical history significant for essential hypertension, hyperlipidemia, CKD stage IIIb-4, chronic diastolic CHF, paroxysmal atrial fibrillation not on anticoagulation due to history of upper GI bleed secondary to duodenal ulcer, non-insulin-dependent type 2 diabetes mellitus, history of DVT who presented to the ED via EMS for altered mental status and poor oral intake.  Patient currently a resident at a nursing facility and was found to be unresponsive.  Patient also with obstructive uropathy with chronic Foley in place with no urine output noted in the Foley bag upon arrival.  Recently admitted to the hospital last month for encephalopathy secondary to UTI.  Unable to obtain history from patient as he was altered and somnolent.  In the ED, patient was afebrile tachycardic with heart rate up to 110s.  Hypotensive with SBP low as 80s.  Not hypoxic on room air.  WBC count 97.3 with neutrophilic predominance, hemoglobin 13.7, platelet 280, sodium 139, potassium 5.3, chloride 109, bicarb 23, BUN 81, creatinine 3.5 (baseline 1.4), glucose 179, AST 44, ALT 55, alkaline phosphatase 156, total bilirubin 1.6, lactic acid 2.8.  Urinalysis with large amount of leukoesterase, negative nitrate, greater than 50 WBCs.  SARS-CoV-2/rapid influenza negative.  Chest x-ray with no acute cardiopulmonary disease process.  Patient was started on broad-spectrum antibiotics with vancomycin, cefepime and metronidazole.  Patient was given 30 mL/kg fluid bolus per sepsis protocol.  TRH consulted for further evaluation and treatment.   Assessment & Plan:   Principal Problem:   Catheter-associated urinary tract infection (HCC) Active Problems:   Severe sepsis (HCC)   Acute-on-chronic kidney injury ([place])   Acute encephalopathy   Atrial fibrillation with rapid  ventricular response (HCC)  Acute metabolic encephalopathy Patient presenting from nursing facility with altered mental status, likely multifactorial in the setting of severe sepsis, catheter associated UTI, AKI with uremia and severe dehydration.  Patient continues to be somnolent but does respond appropriately to some command. --Continue treatment as outlined below  Severe sepsis secondary to catheter associated UTI Patient presenting from nursing facility with altered mental status, found to be tachycardic, hypertensive with SBP's as low as 80s.  WBC count at 53.2 with neutrophilic predominance with a lactic acid of 2.8.  Complicated by history of obstructive uropathy with indwelling Foley catheter in place.  Urinalysis with large leukoesterase, negative nitrate, greater than 50 WBCs.  Patient received 30 mL/kg fluid bolus per sepsis protocol; with improvement of blood pressure. --WBC 38.4>31.3 --Lactic acid 2.8>2.2 --Blood culture: Pending --Urine culture: Pending --Continue empiric antibiotics with cefepime  Acute renal failure on CKD stage IIIb/IV Baseline creatinine 1.4.  Creatinine on presentation 3.56.  Likely secondary to prerenal azotemia versus ATN from severe dehydration/sepsis as above.  Renal ultrasound with increased echogenicity and renal cortical thinning consistent with chronic medical renal disease, no hydronephrosis or bladder distention. FeNa 0.5%; consistent with prerenal azotemia --Cr 3.56>3.40 --LR at 125 mL's per hour --Continue Foley catheter in place; exchanged on 06/17/2020 --Avoid nephrotoxins, renally dose all medications --Strict I's and O's --Follow renal function closely daily  Atrial fibrillation with RVR Patient with tachycardia with heart rate in the 11s on admission, likely provoked by poor oral intake in the setting of severe sepsis as above.  Received aggressive IV fluid hydration with improvement of heart rate.  Not on chronic anticoagulation due to  history of GI bleed secondary to  duodenal ulcer.  Was previously on a beta-blocker and amiodarone, that was discontinued during previous hospitalization due to bradycardia. --Continue to monitor on telemetry  Hypernatremia Sodium 149 presentation, likely secondary to severe dehydration. --Continue IV fluid hydration with LR at 125 mL's per hour --Follow CMP daily  Borderline hyperkalemia Potassium 5.3 on admission, likely secondary to AKI/dehydration. --K 5.3>4.0 --Holding home potassium supplement --Continue IV fluid hydration --Continue to monitor on telemetry --Repeat electrolytes in a.m.  Elevated LFTs AST 44 ALT 55 on admission, likely secondary to severe sepsis. --Hold home statin --Follow CMP daily  History of essential hypertension Chronic diastolic congestive heart failure Patient follows with cardiology, Dr. Geraldo Pitter, last seen in office 04/14/2020. Was previously on amiodarone and beta-blocker, was discontinued during last admission for episodes of bradycardia  History of hyperlipidemia --Holding home pravastatin 20 mg p.o. daily secondary to mild elevation of LFTs --Continue monitor CMP daily  Noninsulin-dependent type 2 diabetes mellitus On glimepiride 1 mg p.o. daily at home --Hemoglobin A1c 6.4 on 04/14/2020, repeat pending --Hold oral hypoglycemics while inpatient --Insulin sliding scale for coverage while inpatient --CBGs before every meal/at bedtime  Depression: Continue sertraline 25 mg p.o. daily  BPH Obstructive uropathy --Foley catheter exchanged on 06/17/2020 On finasteride 5 mg p.o. daily, tamsulosin 0.4 mg daily at home.  Dysphagia Likely related to his encephalopathy/sepsis as above.  Seen by speech therapy with recommendations of dysphagia 1 pured diet with thin liquids. --Aspiration precautions   DVT prophylaxis: Heparin Code Status: Full code Family Communication: updated patients spouse, [name] via telephone this afternoon  Disposition  Plan:  Status is: Inpatient  Remains inpatient appropriate because:Hemodynamically unstable, Persistent severe electrolyte disturbances, Altered mental status, Unsafe d/c plan, IV treatments appropriate due to intensity of illness or inability to take PO and Inpatient level of care appropriate due to severity of illness   Dispo: The patient is from: SNF              Anticipated d/c is to: SNF              Anticipated d/c date is: > 3 days              Patient currently is not medically stable to d/c.   Consultants:   None  Procedures:   Foley catheter exchange 06/17/2020  Antimicrobials:   Vancomycin 10/29  Metronidazole 10/29  Cefepime 10/29>>   Subjective: Patient seen and examined bedside, continues in ED holding area.  Remains confused, unable to understand his communication.  Moving all extremities, sleeping but arousable.  Continues with Foley catheter in place, exchanged earlier this morning in the ED. no family present at bedside.  Unable to obtain any further ROS due to his current mental status.  Objective: Vitals:   06/17/20 1045 06/17/20 1147 06/17/20 1200 06/17/20 1245  BP: 121/77   (!) 101/58  Pulse: 64 88 87 (!) 59  Resp: 14 17 17    Temp: 98 F (36.7 C)   98.2 F (36.8 C)  TempSrc:    Oral  SpO2: 100% 98%  97%    Intake/Output Summary (Last 24 hours) at 06/17/2020 1348 Last data filed at 06/17/2020 0606 Gross per 24 hour  Intake 2100 ml  Output 800 ml  Net 1300 ml   There were no vitals filed for this visit.  Examination:  General exam: Appears calm and comfortable, ill in appearance, somnolent but arousable Respiratory system: Clear to auscultation. Respiratory effort normal.  Oxygenating well on room air Cardiovascular system: S1 &  S2 heard, RRR. No JVD, murmurs, rubs, gallops or clicks. No pedal edema. Gastrointestinal system: Abdomen is nondistended, soft and nontender. No organomegaly or masses felt. Normal bowel sounds heard. Central  nervous system: Somnolent, but arouses to command, not oriented to person/place/time or situation. No focal neurological deficits. Extremities: Moves all extremities independently Skin: No rashes, lesions or ulcers Psychiatry: Unable to assess given his current mental status    Data Reviewed: I have personally reviewed following labs and imaging studies  CBC: Recent Labs  Lab 06/17/20 0200 06/17/20 0524  WBC 38.4* 31.3*  NEUTROABS 32.6*  --   HGB 13.7 11.4*  HCT 45.7 38.0*  MCV 96.8 97.4  PLT 280 470   Basic Metabolic Panel: Recent Labs  Lab 06/17/20 0200 06/17/20 0524  NA 149* 149*  K 5.3* 4.0  CL 109 110  CO2 23 26  GLUCOSE 179* 165*  BUN 81* 78*  CREATININE 3.56* 3.40*  CALCIUM 9.3 8.7*   GFR: CrCl cannot be calculated (Unknown ideal weight.). Liver Function Tests: Recent Labs  Lab 06/17/20 0200 06/17/20 0524  AST 44* 37  ALT 55* 48*  ALKPHOS 156* 126  BILITOT 1.6* 0.9  PROT 7.1 5.7*  ALBUMIN 3.1* 2.4*   No results for input(s): LIPASE, AMYLASE in the last 168 hours. No results for input(s): AMMONIA in the last 168 hours. Coagulation Profile: No results for input(s): INR, PROTIME in the last 168 hours. Cardiac Enzymes: No results for input(s): CKTOTAL, CKMB, CKMBINDEX, TROPONINI in the last 168 hours. BNP (last 3 results) No results for input(s): PROBNP in the last 8760 hours. HbA1C: No results for input(s): HGBA1C in the last 72 hours. CBG: Recent Labs  Lab 06/17/20 0813 06/17/20 1204 06/17/20 1244  GLUCAP 119* 117* 90   Lipid Profile: No results for input(s): CHOL, HDL, LDLCALC, TRIG, CHOLHDL, LDLDIRECT in the last 72 hours. Thyroid Function Tests: No results for input(s): TSH, T4TOTAL, FREET4, T3FREE, THYROIDAB in the last 72 hours. Anemia Panel: No results for input(s): VITAMINB12, FOLATE, FERRITIN, TIBC, IRON, RETICCTPCT in the last 72 hours. Sepsis Labs: Recent Labs  Lab 06/17/20 0200 06/17/20 0524  LATICACIDVEN 2.8* 2.2*     Recent Results (from the past 240 hour(s))  Respiratory Panel by RT PCR (Flu A&B, Covid) - Nasopharyngeal Swab     Status: None   Collection Time: 06/17/20  2:09 AM   Specimen: Nasopharyngeal Swab  Result Value Ref Range Status   SARS Coronavirus 2 by RT PCR NEGATIVE NEGATIVE Final    Comment: (NOTE) SARS-CoV-2 target nucleic acids are NOT DETECTED.  The SARS-CoV-2 RNA is generally detectable in upper respiratoy specimens during the acute phase of infection. The lowest concentration of SARS-CoV-2 viral copies this assay can detect is 131 copies/mL. A negative result does not preclude SARS-Cov-2 infection and should not be used as the sole basis for treatment or other patient management decisions. A negative result may occur with  improper specimen collection/handling, submission of specimen other than nasopharyngeal swab, presence of viral mutation(s) within the areas targeted by this assay, and inadequate number of viral copies (<131 copies/mL). A negative result must be combined with clinical observations, patient history, and epidemiological information. The expected result is Negative.  Fact Sheet for Patients:  PinkCheek.be  Fact Sheet for Healthcare Providers:  GravelBags.it  This test is no t yet approved or cleared by the [place] FDA and  has been authorized for detection and/or diagnosis of SARS-CoV-2 by FDA under an Emergency Use Authorization (EUA). This EUA  will remain  in effect (meaning this test can be used) for the duration of the COVID-19 declaration under Section 564(b)(1) of the Act, 21 U.S.C. section 360bbb-3(b)(1), unless the authorization is terminated or revoked sooner.     Influenza A by PCR NEGATIVE NEGATIVE Final   Influenza B by PCR NEGATIVE NEGATIVE Final    Comment: (NOTE) The Xpert Xpress SARS-CoV-2/FLU/RSV assay is intended as an aid in  the diagnosis of influenza from  Nasopharyngeal swab specimens and  should not be used as a sole basis for treatment. Nasal washings and  aspirates are unacceptable for Xpert Xpress SARS-CoV-2/FLU/RSV  testing.  Fact Sheet for Patients: PinkCheek.be  Fact Sheet for Healthcare Providers: GravelBags.it  This test is not yet approved or cleared by the [place] FDA and  has been authorized for detection and/or diagnosis of SARS-CoV-2 by  FDA under an Emergency Use Authorization (EUA). This EUA will remain  in effect (meaning this test can be used) for the duration of the  Covid-19 declaration under Section 564(b)(1) of the Act, 21  U.S.C. section 360bbb-3(b)(1), unless the authorization is  terminated or revoked. Performed at [facility], [place] [address] [place], [place] [address]          Radiology Studies: US RENAL  Result Date: 06/17/2020 CLINICAL DATA:  Acute kidney injury. EXAM: RENAL / URINARY TRACT ULTRASOUND COMPLETE COMPARISON:  CT 05/16/2020.  Ultrasound 09/22/2018. FINDINGS: Right Kidney: Renal measurements: 9.2 x 4.1 x 3.9 cm = volume: 77.9 mL. Renal cortical thinning. Increased echogenicity. No mass or hydronephrosis visualized. Left Kidney: Renal measurements: 8.2 x 3.7 x 4.1 cm = volume: 64.4 mL. Renal cortical thinning. Increased echogenicity. No mass or hydronephrosis visualized. Bladder: Foley catheter present.  Bladder nondistended. Other: None. IMPRESSION: 1. Increased echogenicity and renal cortical thinning noted bilaterally consistent with chronic medical renal disease. 2. No acute abnormality identified. No hydronephrosis or bladder distention. Electronically Signed   By: Marcello Moores  Register   On: 06/17/2020 05:17   DG Chest Port 1 View  Result Date: 06/17/2020 CLINICAL DATA:  Encephalopathy EXAM: PORTABLE CHEST 1 VIEW COMPARISON:  05/10/2020 FINDINGS: The heart size and mediastinal contours are within normal limits. Both lungs  are clear. The visualized skeletal structures are unremarkable. IMPRESSION: No active disease. Electronically Signed   By: Ulyses Jarred M.D.   On: 06/17/2020 02:24        Scheduled Meds: . heparin  5,000 Units Subcutaneous Q8H  . insulin aspart  0-5 Units Subcutaneous QHS  . insulin aspart  0-9 Units Subcutaneous TID WC   Continuous Infusions: . ceFEPime (MAXIPIME) IV Stopped (06/17/20 0415)  . lactated ringers 125 mL/hr at 06/17/20 1251     LOS: 0 days    Time spent: 42 minutes spent on chart review, discussion with nursing staff, consultants, updating family and interview/physical exam; more than 50% of that time was spent in counseling and/or coordination of care.    Baneen Wieseler J [place] DO [facility] Available via Epic secure chat 7am-7pm After these hours, please refer to coverage provider listed on [email or web address] 06/17/2020, 1:48 PM

## 2020-06-17 NOTE — ED Provider Notes (Signed)
[facility] EMERGENCY DEPARTMENT Provider Note   CSN: [id number] Arrival date & time: 06/17/20  0123     History Chief Complaint  Patient presents with  . Altered Mental Status    Erik Morales is a 79 y.o. male.  Patient presents to the emergency department with a chief complaint of altered mental status.  EMS was called to the patient's facility after the patient was found to be unresponsive.  Reportedly, patient had O2 saturation of 77% at the facility, this increased to 88% with 2 L nasal cannula, and EMS put patient on nonrebreather while in route to the hospital.  Patient has history of A. fib.  He was recently admitted for acute encephalopathy, thought to be secondary to metabolic process and severe UTI.  He has a Foley catheter.  Patient states that he is thirsty.  He denies being in any pain.  He denies being sick recently.  Level 5 caveat applies secondary to altered mental state.  The history is provided by the patient. No language interpreter was used.       Past Medical History:  Diagnosis Date  . Abnormal thyroid function test 09/25/2018  . Abnormality of gait 12/05/2018  . Acute blood loss anemia   . Acute on chronic renal failure ([place]) 10/13/2018  . AKI (acute kidney injury) ([place]) 09/25/2018  . CAP (community acquired pneumonia) 09/25/2018  . Chronic diastolic congestive heart failure ([place])   . Chronic kidney disease (CKD), stage III (moderate)   . Debility 10/02/2018  . Diabetes mellitus due to underlying condition with unspecified complications ([place]) 02/21/9162  . DM2 (diabetes mellitus, type 2) ([place])   . Duodenal ulcer with hemorrhage   . E-coli UTI 09/25/2018  . Elevated troponin 09/25/2018  . Hematochezia 09/25/2018  . History of GI bleed   . HLD (hyperlipidemia)   . HTN (hypertension)   . Hypoalbuminemia due to protein-calorie malnutrition ([place])   . Hypotension   . Leukocytosis   . Melena   . Nonsustained ventricular tachycardia ([place]) 09/04/2019    . PAF (paroxysmal atrial fibrillation) ([place]) 01/27/2019  . Pressure injury of skin 09/26/2018  . Severe sepsis ([place]) 09/25/2018    Patient Active Problem List   Diagnosis Date Noted  . Altered mental status 05/12/2020  . Acute encephalopathy 05/10/2020  . 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 Sinus bradycardia 05/10/2020  . Nonsustained ventricular tachycardia ([place]) 09/04/2019  . PAF (paroxysmal atrial fibrillation) ([place]) 01/27/2019  . Diabetes mellitus due to underlying condition with unspecified complications ([place]) 84/66/5993  . Abnormality of gait 12/05/2018  . Acute on chronic renal failure ([place]) 10/13/2018  . Hypotension   . CKD (chronic kidney disease), stage IV ([place])   . Hypoalbuminemia due to protein-calorie malnutrition ([place])   . History of GI bleed   . Chronic diastolic congestive heart failure ([place])   . Debility 10/02/2018  . Leukocytosis   . Duodenal ulcer with hemorrhage   . Melena   . Acute blood loss anemia   . Pressure injury of skin 09/26/2018  . Hematochezia 09/25/2018  . AKI (acute kidney injury) ([place]) 09/25/2018  . Severe sepsis ([place]) 09/25/2018  . Elevated troponin 09/25/2018  . Abnormal thyroid function test 09/25/2018  . HTN (hypertension) 09/25/2018  . E-coli UTI 09/25/2018  . CAP (community acquired pneumonia) 09/25/2018    Past Surgical History:  Procedure Laterality Date  . ESOPHAGOGASTRODUODENOSCOPY (EGD) WITH PROPOFOL N/A 09/27/2018   Procedure: ESOPHAGOGASTRODUODENOSCOPY (EGD) WITH  PROPOFOL;  Surgeon: Doran Stabler, MD;  Location: [facility];  Service: Gastroenterology;  Laterality: N/A;  . HOT HEMOSTASIS N/A 09/27/2018   Procedure: HOT HEMOSTASIS (ARGON PLASMA COAGULATION/BICAP);  Surgeon: Doran Stabler, MD;  Location: [facility];  Service: Gastroenterology;  Laterality: N/A;  . SUBMUCOSAL INJECTION  09/27/2018   Procedure: SUBMUCOSAL INJECTION;  Surgeon: Doran Stabler, MD;  Location: [facility]  ENDOSCOPY;  Service: Gastroenterology;;       Family History  Problem Relation Age of Onset  . Diabetes Son     Social History   Tobacco Use  . Smoking status: Former Smoker    Types: Cigarettes  . Smokeless tobacco: Never Used  . Tobacco comment: Quit 1970  Vaping Use  . Vaping Use: Never used  Substance Use Topics  . Alcohol use: Never  . Drug use: Never    Home Medications Prior to Admission medications   Medication Sig Start Date End Date Taking? Authorizing Provider  acetaminophen (TYLENOL) 500 MG tablet Take 500 mg by mouth every 6 (six) hours as needed for headache (pain).    [provider]  albuterol (VENTOLIN HFA) 108 (90 Base) MCG/ACT inhaler Inhale 1 puff into the lungs every 6 (six) hours as needed for shortness of breath.  02/06/19   [provider]  ferrous sulfate 325 (65 FE) MG tablet Take 325 mg by mouth daily with breakfast.    [provider]  finasteride (PROSCAR) 5 MG tablet Take 1 tablet (5 mg total) by mouth daily. 05/21/20   Nita Sells, MD  fluticasone (FLONASE) 50 MCG/ACT nasal spray Place 2 sprays into both nostrils daily. 11/14/18   [provider]  glimepiride (AMARYL) 1 MG tablet Take 1 tablet (1 mg total) by mouth daily with breakfast. 10/11/18   Love, Ivan Anchors, PA-C  latanoprost (XALATAN) 0.005 % ophthalmic solution Place 1 drop into both eyes at bedtime. 10/10/18   Love, Ivan Anchors, PA-C  montelukast (SINGULAIR) 10 MG tablet Take 1 tablet (10 mg total) by mouth at bedtime as needed (allergies). 10/10/18   Love, Ivan Anchors, PA-C  pantoprazole (PROTONIX) 20 MG tablet Take 20 mg by mouth 2 (two) times daily.     [provider]  polyethylene glycol (MIRALAX / GLYCOLAX) packet Take 17 g by mouth daily as needed for mild constipation. 10/10/18   Love, Ivan Anchors, PA-C  potassium chloride (KLOR-CON) 10 MEQ tablet Take 10 mEq by mouth daily. 05/26/19   [provider]  pravastatin (PRAVACHOL) 40 MG  tablet Take 0.5 tablets (20 mg total) by mouth at bedtime. 04/18/20   Revankar, Reita Cliche, MD  sulfamethoxazole-trimethoprim (BACTRIM DS) 800-160 MG tablet Take 1 tablet by mouth every 12 (twelve) hours. 05/20/20   Nita Sells, MD  tamsulosin (FLOMAX) 0.4 MG CAPS capsule Take 1 capsule (0.4 mg total) by mouth daily after supper. 05/20/20   Nita Sells, MD  Vitamin D, Ergocalciferol, (DRISDOL) 1.25 MG (50000 UT) CAPS capsule Take 50,000 Units by mouth every 7 (seven) days.     [provider]    Allergies    Patient has no known allergies.  Review of Systems   Review of Systems  All other systems reviewed and are negative.   Physical Exam Updated Vital Signs BP 92/67   Pulse (!) 47   Temp 98.9 F (37.2 C) (Oral)   Resp 12   SpO2 99%   Physical Exam Vitals and nursing note reviewed.  Constitutional:  Appearance: He is well-developed.  HENT:     Head: Normocephalic and atraumatic.     Mouth/Throat:     Mouth: Mucous membranes are dry.  Eyes:     Conjunctiva/sclera: Conjunctivae normal.  Cardiovascular:     Rate and Rhythm: Regular rhythm. Tachycardia present.     Heart sounds: No murmur heard.   Pulmonary:     Effort: Pulmonary effort is normal. No respiratory distress.     Breath sounds: Normal breath sounds.  Abdominal:     Palpations: Abdomen is soft.     Tenderness: There is no abdominal tenderness.  Genitourinary:    Comments: Foley in place, no erythema or discharge seen, RN reports purulent discharge when exchanging foley Musculoskeletal:     Cervical back: Neck supple.  Skin:    General: Skin is warm and dry.  Neurological:     Mental Status: He is alert.     Comments: Alert, disoriented to time and place, does answer some questions appropriately  Psychiatric:     Comments: Unable to assess     ED Results / Procedures / Treatments   Labs (all labs ordered are listed, but only abnormal results are displayed) Labs Reviewed    COMPREHENSIVE METABOLIC PANEL - Abnormal; Notable for the following components:      Result Value   Sodium 149 (*)    Potassium 5.3 (*)    Glucose, Bld 179 (*)    BUN 81 (*)    Creatinine, Ser 3.56 (*)    Albumin 3.1 (*)    AST 44 (*)    ALT 55 (*)    Alkaline Phosphatase 156 (*)    Total Bilirubin 1.6 (*)    GFR, Estimated 16 (*)    Anion gap 17 (*)    All other components within normal limits  URINE CULTURE  RESPIRATORY PANEL BY RT PCR (FLU A&B, COVID)  LACTIC ACID, PLASMA  CBC WITH DIFFERENTIAL/PLATELET  URINALYSIS, ROUTINE W REFLEX MICROSCOPIC    EKG None  Radiology DG Chest Port 1 View  Result Date: 06/17/2020 CLINICAL DATA:  Encephalopathy EXAM: PORTABLE CHEST 1 VIEW COMPARISON:  05/10/2020 FINDINGS: The heart size and mediastinal contours are within normal limits. Both lungs are clear. The visualized skeletal structures are unremarkable. IMPRESSION: No active disease. Electronically Signed   By: Ulyses Jarred M.D.   On: 06/17/2020 02:24    Procedures .Critical Care Performed by: Montine Circle, PA-C Authorized by: Montine Circle, PA-C   Critical care provider statement:    Critical care time (minutes):  45   Critical care was necessary to treat or prevent imminent or life-threatening deterioration of the following conditions:  Sepsis and renal failure   Critical care was time spent personally by me on the following activities:  Discussions with consultants, evaluation of patient's response to treatment, examination of patient, ordering and performing treatments and interventions, ordering and review of laboratory studies, ordering and review of radiographic studies, pulse oximetry, re-evaluation of patient's condition, obtaining history from patient or surrogate and review of old charts   (including critical care time)  Medications Ordered in ED Medications  lactated ringers bolus 1,000 mL (1,000 mLs Intravenous New Bag/Given 06/17/20 0236)    ED Course   I have reviewed the triage vital signs and the nursing notes.  Pertinent labs & imaging results that were available during my care of the patient were reviewed by me and considered in my medical decision making (see chart for details).    MDM Rules/Calculators/A&P  This patient complains of altered mental status, this involves an extensive number of treatment options, and is a complaint that carries with it a high risk of complications and morbidity.     Code sepsis activated upon seeing WBC and lactic.  Weight based fluids ordered.  Differential Dx UTI, metabolic encephalopathy, sepsis  Pertinent Labs I ordered, reviewed, and interpreted labs, which included CBC, CMP, lactic, CBC notable for leukocytosis to 38, CMP notable for hyponatremia, hyperkalemia, elevated BUN, AKI, Cr is 3.56, lactic 2.8.  Imaging Interpretation I ordered imaging studies which included CXR.  I independently visualized and interpreted the CXR, which showed no obvious abnormality.   Medications I ordered medication fluids and antibiotics for sepsis  Sources Previous records obtained and reviewed recent admission for sepsis and UTI and acute metabolic encephalopathy.   Critical Interventions  Fluids and antibiotics.  Reassessments After the interventions stated above, I reevaluated the patient and found with improving BP and HR.  Consultants Dr. Marlowe Sax, who is appreciated for admitting the patient.  Plan Admit    Final Clinical Impression(s) / ED Diagnoses Final diagnoses:  Altered mental status, unspecified altered mental status type  AKI (acute kidney injury) [facility])    Rx / DC Orders ED Discharge Orders    None       Montine Circle, PA-C 06/17/20 0359    Merrily Pew, MD 06/17/20 [phone]

## 2020-06-17 NOTE — Sepsis Progress Note (Signed)
Unable to obtain blood cultures at this time due to patient being hard to stick. Order present not to delay antibiotics if unable to obtain blood cultures

## 2020-06-18 LAB — COMPREHENSIVE METABOLIC PANEL
ALT: 49 U/L — ABNORMAL HIGH (ref 0–44)
AST: 37 U/L (ref 15–41)
Albumin: 2.3 g/dL — ABNORMAL LOW (ref 3.5–5.0)
Alkaline Phosphatase: 118 U/L (ref 38–126)
Anion gap: 11 (ref 5–15)
BUN: 72 mg/dL — ABNORMAL HIGH (ref 8–23)
CO2: 26 mmol/L (ref 22–32)
Calcium: 8.4 mg/dL — ABNORMAL LOW (ref 8.9–10.3)
Chloride: 111 mmol/L (ref 98–111)
Creatinine, Ser: 2.67 mg/dL — ABNORMAL HIGH (ref 0.61–1.24)
GFR, Estimated: 23 mL/min — ABNORMAL LOW (ref >60)
Glucose, Bld: 94 mg/dL (ref 70–99)
Potassium: 3.8 mmol/L (ref 3.5–5.1)
Sodium: 148 mmol/L — ABNORMAL HIGH (ref 135–145)
Total Bilirubin: 0.9 mg/dL (ref 0.3–1.2)
Total Protein: 5.5 g/dL — ABNORMAL LOW (ref 6.5–8.1)

## 2020-06-18 LAB — CBC
HCT: 35.9 % — ABNORMAL LOW (ref 39.0–52.0)
Hemoglobin: 10.7 g/dL — ABNORMAL LOW (ref 13.0–17.0)
MCH: 28.8 pg (ref 26.0–34.0)
MCHC: 29.8 g/dL — ABNORMAL LOW (ref 30.0–36.0)
MCV: 96.5 fL (ref 80.0–100.0)
Platelets: 215 10*3/uL (ref 150–400)
RBC: 3.72 MIL/uL — ABNORMAL LOW (ref 4.22–5.81)
RDW: 13.4 % (ref 11.5–15.5)
WBC: 29.8 10*3/uL — ABNORMAL HIGH (ref 4.0–10.5)
nRBC: 0 % (ref 0.0–0.2)

## 2020-06-18 LAB — GLUCOSE, CAPILLARY
Glucose-Capillary: 81 mg/dL (ref 70–99)
Glucose-Capillary: 145 mg/dL — ABNORMAL HIGH (ref 70–99)
Glucose-Capillary: 148 mg/dL — ABNORMAL HIGH (ref 70–99)

## 2020-06-18 LAB — URINE CULTURE: Culture: >=100000 — AB

## 2020-06-18 LAB — MAGNESIUM: Magnesium: 2.2 mg/dL (ref 1.7–2.4)

## 2020-06-18 LAB — LACTIC ACID, PLASMA: Lactic Acid, Venous: 1.2 mmol/L (ref 0.5–1.9)

## 2020-06-18 MED ORDER — FINASTERIDE 5 MG PO TABS
5.0000 mg | ORAL_TABLET | Freq: Every day | ORAL | Status: DC
  Administered 2020-06-18 – 2020-06-22 (×5): 5 mg via ORAL
  Filled 2020-06-18 (×5): qty 1

## 2020-06-18 MED ORDER — DEXTROSE-NACL 5-0.45 % IV SOLN: INTRAVENOUS | Status: AC

## 2020-06-18 MED ORDER — TAMSULOSIN HCL 0.4 MG PO CAPS
0.4000 mg | ORAL_CAPSULE | Freq: Every day | ORAL | Status: DC
  Administered 2020-06-18 – 2020-06-22 (×5): 0.4 mg via ORAL
  Filled 2020-06-18 (×5): qty 1

## 2020-06-18 NOTE — Progress Notes (Signed)
PROGRESS NOTE    Erik Morales  EQA:[id number] DOB: Sep 14, 1940 DOA: 06/17/2020 PCP: [name], MD    Brief Narrative:  Erik Morales male with past medical history significant for essential hypertension, hyperlipidemia, CKD stage IIIb-4, chronic diastolic CHF, paroxysmal atrial fibrillation not on anticoagulation due to history of upper GI bleed secondary to duodenal ulcer, non-insulin-dependent type 2 diabetes mellitus, history of DVT who presented to the ED via EMS for altered mental status and poor oral intake.  Patient currently a resident at a nursing facility and was found to be unresponsive.  Patient also with obstructive uropathy with chronic Foley in place with no urine output noted in the Foley bag upon arrival.  Recently admitted to the hospital last month for encephalopathy secondary to UTI.  Unable to obtain history from patient as he was altered and somnolent.  In the ED, patient was afebrile tachycardic with heart rate up to 110s.  Hypotensive with SBP low as 80s.  Not hypoxic on room air.  WBC count 97.9 with neutrophilic predominance, hemoglobin 13.7, platelet 280, sodium 139, potassium 5.3, chloride 109, bicarb 23, BUN 81, creatinine 3.5 (baseline 1.4), glucose 179, AST 44, ALT 55, alkaline phosphatase 156, total bilirubin 1.6, lactic acid 2.8.  Urinalysis with large amount of leukoesterase, negative nitrate, greater than 50 WBCs.  SARS-CoV-2/rapid influenza negative.  Chest x-ray with no acute cardiopulmonary disease process.  Patient was started on broad-spectrum antibiotics with vancomycin, cefepime and metronidazole.  Patient was given 30 mL/kg fluid bolus per sepsis protocol.  TRH consulted for further evaluation and treatment.   Assessment & Plan:   Principal Problem:   Catheter-associated urinary tract infection (HCC) Active Problems:   Severe sepsis (HCC)   Acute-on-chronic kidney injury ([place])   Acute encephalopathy   Atrial fibrillation with rapid  ventricular response (HCC)  Acute metabolic encephalopathy Patient presenting from nursing facility with altered mental status, likely multifactorial in the setting of severe sepsis, catheter associated UTI, AKI with uremia and severe dehydration.  Patient continues to be somnolent but does respond appropriately to some command. --Continue treatment as outlined below  Severe sepsis secondary to catheter associated UTI Patient presenting from nursing facility with altered mental status, found to be tachycardic, hypertensive with SBP's as low as 80s.  WBC count at 89.2 with neutrophilic predominance with a lactic acid of 2.8.  Complicated by history of obstructive uropathy with indwelling Foley catheter in place.  Urinalysis with large leukoesterase, negative nitrate, greater than 50 WBCs. Patient received 30 mL/kg fluid bolus per sepsis protocol; with improvement of blood pressure. --WBC 38.4>31.3>29.8 --Lactic acid 2.8>2.2>1.2 --Blood culture: no growth <12h --Urine culture: Pending --Continue empiric antibiotics with cefepime  Acute renal failure on CKD stage IIIb/IV Baseline creatinine 1.4.  Creatinine on presentation 3.56.  Likely secondary to prerenal azotemia versus ATN from severe dehydration/sepsis as above.  Renal ultrasound with increased echogenicity and renal cortical thinning consistent with chronic medical renal disease, no hydronephrosis or bladder distention. FeNa 0.5%; consistent with prerenal azotemia --Cr 3.56>3.40>2.67 --D5 1/2 NS at 100 mL's per hour --Continue Foley catheter in place; exchanged on 06/17/2020 --Avoid nephrotoxins, renally dose all medications --Strict I's and O's --Follow renal function closely daily  Atrial fibrillation with RVR Patient with tachycardia with heart rate in the 11s on admission, likely provoked by poor oral intake in the setting of severe sepsis as above.  Received aggressive IV fluid hydration with improvement of heart rate.  Not on  chronic anticoagulation due to history of GI  bleed secondary to duodenal ulcer.  Was previously on a beta-blocker and amiodarone, that was discontinued during previous hospitalization due to bradycardia. --Continue to monitor on telemetry  Hypernatremia Sodium 149 presentation, likely secondary to severe dehydration. --Na 149>148 --Continue IV fluid hydration with D5 1/2 NS at 100 mL's per hour --Follow CMP daily  Hyperkalemia:resolved Potassium 5.3 on admission, likely secondary to AKI/dehydration. --K 5.3>4.0>3.8 --Holding home potassium supplement --Continue IV fluid hydration --Continue to monitor on telemetry --Repeat electrolytes in a.m.  Elevated LFTs Likely secondary to severe sepsis. --AST 44>37 --ALT 55>49 --Hold home statin --Follow CMP daily  History of essential hypertension Chronic diastolic congestive heart failure Patient follows with cardiology, Dr. Geraldo Pitter, last seen in office 04/14/2020. Was previously on amiodarone and beta-blocker, was discontinued during last admission for episodes of bradycardia  History of hyperlipidemia --Holding home pravastatin 20 mg p.o. daily secondary to mild elevation of LFTs --Continue monitor CMP daily  Noninsulin-dependent type 2 diabetes mellitus On glimepiride 1 mg p.o. daily at home --Hemoglobin A1c 6.4 on 04/14/2020, repeat pending --Hold oral hypoglycemics while inpatient --Insulin sliding scale for coverage while inpatient --CBGs before every meal/at bedtime  Depression: Continue sertraline 25 mg p.o. daily  BPH Obstructive uropathy --Foley catheter exchanged on 06/17/2020 --resume home finasteride 5 mg p.o. daily, tamsulosin 0.4 mg daily now BP improved.  Dysphagia Likely related to his encephalopathy/sepsis as above.  Seen by speech therapy with recommendations of dysphagia 1 pured diet with thin liquids. --Aspiration precautions   DVT prophylaxis: Heparin Code Status: Full code Family Communication:  Attempted to update patient spouse, [name] via telephone this morning  Disposition Plan:  Status is: Inpatient  Remains inpatient appropriate because:Hemodynamically unstable, Persistent severe electrolyte disturbances, Altered mental status, Unsafe d/c plan, IV treatments appropriate due to intensity of illness or inability to take PO and Inpatient level of care appropriate due to severity of illness   Dispo: The patient is from: SNF              Anticipated d/c is to: SNF              Anticipated d/c date is: > 3 days              Patient currently is not medically stable to d/c.   Consultants:   None  Procedures:   Foley catheter exchange 06/17/2020  Antimicrobials:   Vancomycin 10/29  Metronidazole 10/29  Cefepime 10/29>>   Subjective: Patient seen and examined bedside, lying in bed.  Pleasantly confused.  Much more interactive this morning.  Denies hunger or thirst.  No other complaints or concerns at this time.  Denies chest pain, no shortness of breath, no abdominal pain.  No acute events overnight per nursing staff.  Objective: Vitals:   06/17/20 1955 06/17/20 2356 06/18/20 0403 06/18/20 0809  BP: 108/79 128/78 (!) 161/83 129/67  Pulse: 96 92 89 86  Resp: 19 18 18 19   Temp: 98.7 F (37.1 C) 98.2 F (36.8 C) 98.5 F (36.9 C) 97.7 F (36.5 C)  TempSrc: Oral Oral Oral Oral  SpO2: 95% 94% 97% 100%    Intake/Output Summary (Last 24 hours) at 06/18/2020 1009 Last data filed at 06/18/2020 0900 Gross per 24 hour  Intake 2454.13 ml  Output 550 ml  Net 1904.13 ml   There were no vitals filed for this visit.  Examination:  General exam: Appears calm and comfortable, ill in appearance, noted hand mitts in place Respiratory system: Clear to auscultation. Respiratory effort normal.  Oxygenating well on room air Cardiovascular system: S1 & S2 heard, RRR. No JVD, murmurs, rubs, gallops or clicks. No pedal edema. Gastrointestinal system: Abdomen is nondistended,  soft and nontender. No organomegaly or masses felt. Normal bowel sounds heard. Central nervous system: Alert, not oriented to person (President: Thompson Grayer) place (going [place]) time (year: 43) or situation. No focal neurological deficits. Extremities: Moves all extremities independently, noted hand mitts in place Skin: No rashes, lesions or ulcers Psychiatry: Depressed mood, flat affect.    Data Reviewed: I have personally reviewed following labs and imaging studies  CBC: Recent Labs  Lab 06/17/20 0200 06/17/20 0524 06/18/20 0334  WBC 38.4* 31.3* 29.8*  NEUTROABS 32.6*  --   --   HGB 13.7 11.4* 10.7*  HCT 45.7 38.0* 35.9*  MCV 96.8 97.4 96.5  PLT 280 219 778   Basic Metabolic Panel: Recent Labs  Lab 06/17/20 0200 06/17/20 0524 06/18/20 0334  NA 149* 149* 148*  K 5.3* 4.0 3.8  CL 109 110 111  CO2 23 26 26   GLUCOSE 179* 165* 94  BUN 81* 78* 72*  CREATININE 3.56* 3.40* 2.67*  CALCIUM 9.3 8.7* 8.4*  MG  --   --  2.2   GFR: CrCl cannot be calculated (Unknown ideal weight.). Liver Function Tests: Recent Labs  Lab 06/17/20 0200 06/17/20 0524 06/18/20 0334  AST 44* 37 37  ALT 55* 48* 49*  ALKPHOS 156* 126 118  BILITOT 1.6* 0.9 0.9  PROT 7.1 5.7* 5.5*  ALBUMIN 3.1* 2.4* 2.3*   No results for input(s): LIPASE, AMYLASE in the last 168 hours. No results for input(s): AMMONIA in the last 168 hours. Coagulation Profile: No results for input(s): INR, PROTIME in the last 168 hours. Cardiac Enzymes: No results for input(s): CKTOTAL, CKMB, CKMBINDEX, TROPONINI in the last 168 hours. BNP (last 3 results) No results for input(s): PROBNP in the last 8760 hours. HbA1C: Recent Labs    06/17/20 0524  HGBA1C 5.9*   CBG: Recent Labs  Lab 06/17/20 1204 06/17/20 1244 06/17/20 1741 06/17/20 2139 06/18/20 0612  GLUCAP 117* 90 98 94 81   Lipid Profile: No results for input(s): CHOL, HDL, LDLCALC, TRIG, CHOLHDL, LDLDIRECT in the last 72 hours. Thyroid Function  Tests: No results for input(s): TSH, T4TOTAL, FREET4, T3FREE, THYROIDAB in the last 72 hours. Anemia Panel: No results for input(s): VITAMINB12, FOLATE, FERRITIN, TIBC, IRON, RETICCTPCT in the last 72 hours. Sepsis Labs: Recent Labs  Lab 06/17/20 0200 06/17/20 0524 06/18/20 0334  LATICACIDVEN 2.8* 2.2* 1.2    Recent Results (from the past 240 hour(s))  Respiratory Panel by RT PCR (Flu A&B, Covid) - Nasopharyngeal Swab     Status: None   Collection Time: 06/17/20  2:09 AM   Specimen: Nasopharyngeal Swab  Result Value Ref Range Status   SARS Coronavirus 2 by RT PCR NEGATIVE NEGATIVE Final    Comment: (NOTE) SARS-CoV-2 target nucleic acids are NOT DETECTED.  The SARS-CoV-2 RNA is generally detectable in upper respiratoy specimens during the acute phase of infection. The lowest concentration of SARS-CoV-2 viral copies this assay can detect is 131 copies/mL. A negative result does not preclude SARS-Cov-2 infection and should not be used as the sole basis for treatment or other patient management decisions. A negative result may occur with  improper specimen collection/handling, submission of specimen other than nasopharyngeal swab, presence of viral mutation(s) within the areas targeted by this assay, and inadequate number of viral copies (<131 copies/mL). A negative result must be combined  with clinical observations, patient history, and epidemiological information. The expected result is Negative.  Fact Sheet for Patients:  PinkCheek.be  Fact Sheet for Healthcare Providers:  GravelBags.it  This test is no t yet approved or cleared by the [place] FDA and  has been authorized for detection and/or diagnosis of SARS-CoV-2 by FDA under an Emergency Use Authorization (EUA). This EUA will remain  in effect (meaning this test can be used) for the duration of the COVID-19 declaration under Section 564(b)(1) of the Act, 21  U.S.C. section 360bbb-3(b)(1), unless the authorization is terminated or revoked sooner.     Influenza A by PCR NEGATIVE NEGATIVE Final   Influenza B by PCR NEGATIVE NEGATIVE Final    Comment: (NOTE) The Xpert Xpress SARS-CoV-2/FLU/RSV assay is intended as an aid in  the diagnosis of influenza from Nasopharyngeal swab specimens and  should not be used as a sole basis for treatment. Nasal washings and  aspirates are unacceptable for Xpert Xpress SARS-CoV-2/FLU/RSV  testing.  Fact Sheet for Patients: PinkCheek.be  Fact Sheet for Healthcare Providers: GravelBags.it  This test is not yet approved or cleared by the [place] FDA and  has been authorized for detection and/or diagnosis of SARS-CoV-2 by  FDA under an Emergency Use Authorization (EUA). This EUA will remain  in effect (meaning this test can be used) for the duration of the  Covid-19 declaration under Section 564(b)(1) of the Act, 21  U.S.C. section 360bbb-3(b)(1), unless the authorization is  terminated or revoked. Performed at [facility], [place] [address], [place], [place] [address]   Urine culture     Status: None (Preliminary result)   Collection Time: 06/17/20  4:08 AM   Specimen: Urine, Random  Result Value Ref Range Status   Specimen Description URINE, RANDOM  Final   Special Requests NONE  Final   Culture   Final    CULTURE REINCUBATED FOR BETTER GROWTH Performed at [facility], [place] [address], [place], [place] [address]    Report Status PENDING  Incomplete  Culture, blood (single)     Status: None (Preliminary result)   Collection Time: 06/17/20  6:02 AM   Specimen: BLOOD  Result Value Ref Range Status   Specimen Description BLOOD RIGHT ANTECUBITAL  Final   Special Requests   Final    BOTTLES DRAWN AEROBIC AND ANAEROBIC Blood Culture adequate volume   Culture   Final    NO GROWTH < 12 HOURS Performed at [facility],  [place] [address], [place], [place] [address]    Report Status PENDING  Incomplete         Radiology Studies: US RENAL  Result Date: 06/17/2020 CLINICAL DATA:  Acute kidney injury. EXAM: RENAL / URINARY TRACT ULTRASOUND COMPLETE COMPARISON:  CT 05/16/2020.  Ultrasound 09/22/2018. FINDINGS: Right Kidney: Renal measurements: 9.2 x 4.1 x 3.9 cm = volume: 77.9 mL. Renal cortical thinning. Increased echogenicity. No mass or hydronephrosis visualized. Left Kidney: Renal measurements: 8.2 x 3.7 x 4.1 cm = volume: 64.4 mL. Renal cortical thinning. Increased echogenicity. No mass or hydronephrosis visualized. Bladder: Foley catheter present.  Bladder nondistended. Other: None. IMPRESSION: 1. Increased echogenicity and renal cortical thinning noted bilaterally consistent with chronic medical renal disease. 2. No acute abnormality identified. No hydronephrosis or bladder distention. Electronically Signed   By: Marcello Moores  Register   On: 06/17/2020 05:17   DG Chest Port 1 View  Result Date: 06/17/2020 CLINICAL DATA:  Encephalopathy EXAM: PORTABLE CHEST 1 VIEW COMPARISON:  05/10/2020 FINDINGS: The heart size and mediastinal contours are within normal limits. Both lungs are clear. The visualized skeletal structures are unremarkable. IMPRESSION: No active disease. Electronically Signed   By: Ulyses Jarred M.D.   On: 06/17/2020 02:24        Scheduled Meds: . Chlorhexidine Gluconate Cloth  6 each Topical Daily  . heparin  5,000 Units Subcutaneous Q8H  . insulin aspart  0-5 Units Subcutaneous QHS  . insulin aspart  0-9 Units Subcutaneous TID WC  . latanoprost  1 drop Both Eyes QHS  . sertraline  25 mg Oral Daily   Continuous Infusions: . ceFEPime (MAXIPIME) IV Stopped (06/18/20 0625)     LOS: 1 day    Time spent: 38 minutes spent on chart review, discussion with nursing staff, consultants, updating family and interview/physical exam; more than 50% of that time was spent in counseling and/or coordination of  care.    Ja Pistole J [place] DO [facility] Available via Epic secure chat 7am-7pm After these hours, please refer to coverage provider listed on [email or web address] 06/18/2020, 10:09 AM

## 2020-06-19 LAB — GLUCOSE, CAPILLARY
Glucose-Capillary: 188 mg/dL — ABNORMAL HIGH (ref 70–99)
Glucose-Capillary: 198 mg/dL — ABNORMAL HIGH (ref 70–99)
Glucose-Capillary: 170 mg/dL — ABNORMAL HIGH (ref 70–99)
Glucose-Capillary: 143 mg/dL — ABNORMAL HIGH (ref 70–99)

## 2020-06-19 LAB — COMPREHENSIVE METABOLIC PANEL
ALT: 40 U/L (ref 0–44)
AST: 26 U/L (ref 15–41)
Albumin: 2.1 g/dL — ABNORMAL LOW (ref 3.5–5.0)
Alkaline Phosphatase: 98 U/L (ref 38–126)
Anion gap: 8 (ref 5–15)
BUN: 53 mg/dL — ABNORMAL HIGH (ref 8–23)
CO2: 27 mmol/L (ref 22–32)
Calcium: 8.1 mg/dL — ABNORMAL LOW (ref 8.9–10.3)
Chloride: 113 mmol/L — ABNORMAL HIGH (ref 98–111)
Creatinine, Ser: 2.01 mg/dL — ABNORMAL HIGH (ref 0.61–1.24)
GFR, Estimated: 33 mL/min — ABNORMAL LOW (ref >60)
Glucose, Bld: 178 mg/dL — ABNORMAL HIGH (ref 70–99)
Potassium: 3.3 mmol/L — ABNORMAL LOW (ref 3.5–5.1)
Sodium: 148 mmol/L — ABNORMAL HIGH (ref 135–145)
Total Bilirubin: 1.1 mg/dL (ref 0.3–1.2)
Total Protein: 5 g/dL — ABNORMAL LOW (ref 6.5–8.1)

## 2020-06-19 LAB — CBC
HCT: 33.5 % — ABNORMAL LOW (ref 39.0–52.0)
Hemoglobin: 10.1 g/dL — ABNORMAL LOW (ref 13.0–17.0)
MCH: 28.9 pg (ref 26.0–34.0)
MCHC: 30.1 g/dL (ref 30.0–36.0)
MCV: 96 fL (ref 80.0–100.0)
Platelets: 200 10*3/uL (ref 150–400)
RBC: 3.49 MIL/uL — ABNORMAL LOW (ref 4.22–5.81)
RDW: 13.4 % (ref 11.5–15.5)
WBC: 19.9 10*3/uL — ABNORMAL HIGH (ref 4.0–10.5)
nRBC: 0.1 % (ref 0.0–0.2)

## 2020-06-19 LAB — MAGNESIUM: Magnesium: 2.1 mg/dL (ref 1.7–2.4)

## 2020-06-19 MED ORDER — POTASSIUM CHLORIDE CRYS ER 20 MEQ PO TBCR
30.0000 meq | EXTENDED_RELEASE_TABLET | ORAL | Status: AC
  Administered 2020-06-19 (×2): 30 meq via ORAL
  Filled 2020-06-19 (×2): qty 1

## 2020-06-19 MED ORDER — FLUCONAZOLE 200 MG PO TABS
200.0000 mg | ORAL_TABLET | Freq: Every day | ORAL | Status: DC
  Administered 2020-06-19 – 2020-06-22 (×4): 200 mg via ORAL
  Filled 2020-06-19 (×4): qty 1

## 2020-06-19 MED ORDER — FLUCONAZOLE 200 MG PO TABS: 400.0000 mg | ORAL_TABLET | Freq: Every day | ORAL | Status: DC

## 2020-06-19 MED ORDER — HYDROCODONE-ACETAMINOPHEN 5-325 MG PO TABS
1.0000 | ORAL_TABLET | Freq: Four times a day (QID) | ORAL | Status: AC | PRN
  Administered 2020-06-19 – 2020-06-22 (×4): 1 via ORAL
  Filled 2020-06-19 (×6): qty 1

## 2020-06-19 MED ORDER — SODIUM CHLORIDE 0.9 % IV SOLN
INTRAVENOUS | Status: DC | PRN
  Administered 2020-06-19: 500 mL via INTRAVENOUS

## 2020-06-19 NOTE — Progress Notes (Addendum)
PROGRESS NOTE    Erik Morales  JGG:[id number] DOB: 06-14-1941 DOA: 06/17/2020 PCP: [name], MD    Brief Narrative:  Erik Morales male with past medical history significant for essential hypertension, hyperlipidemia, CKD stage IIIb-4, chronic diastolic CHF, paroxysmal atrial fibrillation not on anticoagulation due to history of upper GI bleed secondary to duodenal ulcer, non-insulin-dependent type 2 diabetes mellitus, history of DVT who presented to the ED via EMS for altered mental status and poor oral intake.  Patient currently a resident at a nursing facility and was found to be unresponsive.  Patient also with obstructive uropathy with chronic Foley in place with no urine output noted in the Foley bag upon arrival.  Recently admitted to the hospital last month for encephalopathy secondary to UTI.  Unable to obtain history from patient as he was altered and somnolent.  In the ED, patient was afebrile tachycardic with heart rate up to 110s.  Hypotensive with SBP low as 80s.  Not hypoxic on room air.  WBC count 54.6 with neutrophilic predominance, hemoglobin 13.7, platelet 280, sodium 139, potassium 5.3, chloride 109, bicarb 23, BUN 81, creatinine 3.5 (baseline 1.4), glucose 179, AST 44, ALT 55, alkaline phosphatase 156, total bilirubin 1.6, lactic acid 2.8.  Urinalysis with large amount of leukoesterase, negative nitrate, greater than 50 WBCs.  SARS-CoV-2/rapid influenza negative.  Chest x-ray with no acute cardiopulmonary disease process.  Patient was started on broad-spectrum antibiotics with vancomycin, cefepime and metronidazole.  Patient was given 30 mL/kg fluid bolus per sepsis protocol.  TRH consulted for further evaluation and treatment.   Assessment & Plan:   Principal Problem:   Catheter-associated urinary tract infection (HCC) Active Problems:   Severe sepsis (HCC)   Acute-on-chronic kidney injury ([facility])   Acute encephalopathy   Atrial fibrillation with rapid  ventricular response (HCC)  Acute metabolic encephalopathy Patient presenting from nursing facility with altered mental status, likely multifactorial in the setting of severe sepsis, catheter associated UTI, AKI with uremia and severe dehydration.  Patient continues to be somnolent but does respond appropriately to some command. --Continue treatment as outlined below  Severe sepsis secondary to catheter associated UTI Patient presenting from nursing facility with altered mental status, found to be tachycardic, hypertensive with SBP's as low as 80s.  WBC count at 50.3 with neutrophilic predominance with a lactic acid of 2.8.  Complicated by history of obstructive uropathy with indwelling Foley catheter in place.  Urinalysis with large leukoesterase, negative nitrate, greater than 50 WBCs. Patient received 30 mL/kg fluid bolus per sepsis protocol; with improvement of blood pressure.  Urine culture with >100K yeast --WBC 38.4>31.3>29.8>19.9 --Lactic acid 2.8>2.2>1.2 --Blood culture: no growth x 2 days --Continue empiric antibiotics with cefepime --start fluconazole 200mg  PO daily x 14 days --follow CBC daily  Acute renal failure on CKD stage IIIb Baseline creatinine 1.4.  Creatinine on presentation 3.56.  Likely secondary to prerenal azotemia versus ATN from severe dehydration/sepsis as above.  Renal ultrasound with increased echogenicity and renal cortical thinning consistent with chronic medical renal disease, no hydronephrosis or bladder distention. FeNa 0.5%; consistent with prerenal azotemia --Cr 3.56>3.40>2.67>2.01 --D5 1/2 NS at 100 mL's per hour --Continue Foley catheter in place; exchanged on 06/17/2020 --Avoid nephrotoxins, renally dose all medications --Strict I's and O's --Follow renal function closely daily  Atrial fibrillation with RVR: resolved Patient with tachycardia with heart rate in the 110s on admission, likely provoked by poor oral intake in the setting of severe sepsis as  above.  Received aggressive IV  fluid hydration with improvement of heart rate.  Not on chronic anticoagulation due to history of GI bleed secondary to duodenal ulcer.  Was previously on a beta-blocker and amiodarone, that was discontinued during previous hospitalization due to bradycardia. --Continue to monitor on telemetry  Hypernatremia Sodium 149 presentation, likely secondary to severe dehydration. --Na 149>148 --Continue IV fluid hydration with D5 1/2 NS at 100 mL's per hour --Follow CMP daily  Hyperkalemia:resolved Potassium 5.3 on admission, likely secondary to AKI/dehydration. --K 5.3>4.0>3.8 --Holding home potassium supplement --Continue IV fluid hydration --Continue to monitor on telemetry --Repeat electrolytes in a.m.  Hypokalemia --K 3.3 this am, likely 2/2 poor oral intake and hyperK resolved as above given improving renal function --will replete today and repeat electrolytes in the am  Elevated LFTs: resolved Likely secondary to severe sepsis. --AST 44>37>26 --ALT 55>49>40 --Hold home statin --Follow CMP daily  History of essential hypertension Chronic diastolic congestive heart failure Patient follows with cardiology, Dr. Geraldo Pitter, last seen in office 04/14/2020. Was previously on amiodarone and beta-blocker, was discontinued during last admission for episodes of bradycardia  History of hyperlipidemia --Holding home pravastatin 20 mg p.o. daily secondary to mild elevation of LFTs --Continue monitor CMP daily  Noninsulin-dependent type 2 diabetes mellitus On glimepiride 1 mg p.o. daily at home --Hemoglobin A1c 6.4 on 04/14/2020, repeat pending --Hold oral hypoglycemics while inpatient --Insulin sliding scale for coverage while inpatient --CBGs before every meal/at bedtime  Depression: Continue sertraline 25 mg p.o. daily  BPH Obstructive uropathy --Foley catheter exchanged on 06/17/2020 --resume home finasteride 5 mg p.o. daily, tamsulosin 0.4 mg daily now  BP improved. --consider voiding trial tomorrow  Dysphagia Likely related to his encephalopathy/sepsis as above.  Seen by speech therapy with recommendations of dysphagia 1 pured diet with thin liquids. --Aspiration precautions  Weakness, deconditioning --PT/OT evaluation pending   DVT prophylaxis: Heparin Code Status: Full code Family Communication: Attempted to update patient's spouse, [name] via telephone this morning, unsuccessful with no answer.  Disposition Plan:  Status is: Inpatient  Remains inpatient appropriate because:Hemodynamically unstable, Persistent severe electrolyte disturbances, Altered mental status, Unsafe d/c plan, IV treatments appropriate due to intensity of illness or inability to take PO and Inpatient level of care appropriate due to severity of illness   Dispo: The patient is from: SNF              Anticipated d/c is to: SNF              Anticipated d/c date is: > 3 days              Patient currently is not medically stable to d/c.   Consultants:   None  Procedures:   Foley catheter exchange 06/17/2020  Antimicrobials:   Vancomycin 10/29  Metronidazole 10/29  Cefepime 10/29>>   Subjective: Patient seen and examined bedside, lying in bed.  Pleasantly confused.  Reports poor appetite.  No other complaints or concerns at this time.  Denies chest pain, no shortness of breath, no abdominal pain.  No acute events overnight per nursing staff.  No family present at bedside.  Objective: Vitals:   06/18/20 2019 06/18/20 2343 06/19/20 0401 06/19/20 0730  BP: 136/70 (!) 143/100 (!) 143/68 (!) 148/72  Pulse: 79 74 73 70  Resp: 18 18 18 20   Temp: 98.2 F (36.8 C) 98.4 F (36.9 C) 98.1 F (36.7 C) 98.2 F (36.8 C)  TempSrc: Oral Oral Oral Oral  SpO2: 100% 95% 100% 98%    Intake/Output Summary (Last 24  hours) at 06/19/2020 1018 Last data filed at 06/19/2020 0619 Gross per 24 hour  Intake 1946.3 ml  Output 700 ml  Net 1246.3 ml   There  were no vitals filed for this visit.  Examination:  General exam: Appears calm and comfortable, ill in appearance, noted hand mitts in place Respiratory system: Clear to auscultation. Respiratory effort normal.  Oxygenating well on room air Cardiovascular system: S1 & S2 heard, RRR. No JVD, murmurs, rubs, gallops or clicks. No pedal edema. Gastrointestinal system: Abdomen is nondistended, soft and nontender. No organomegaly or masses felt. Normal bowel sounds heard. Central nervous system: Alert, oriented to person (President: Edmon Crape), but not place ("I'm lost") time (year: 42) or situation. No focal neurological deficits. Extremities: Moves all extremities independently, noted hand mitts in place Skin: No rashes, lesions or ulcers Psychiatry: Depressed mood, flat affect.    Data Reviewed: I have personally reviewed following labs and imaging studies  CBC: Recent Labs  Lab 06/17/20 0200 06/17/20 0524 06/18/20 0334 06/19/20 0252  WBC 38.4* 31.3* 29.8* 19.9*  NEUTROABS 32.6*  --   --   --   HGB 13.7 11.4* 10.7* 10.1*  HCT 45.7 38.0* 35.9* 33.5*  MCV 96.8 97.4 96.5 96.0  PLT 280 219 215 235   Basic Metabolic Panel: Recent Labs  Lab 06/17/20 0200 06/17/20 0524 06/18/20 0334 06/19/20 0252  NA 149* 149* 148* 148*  K 5.3* 4.0 3.8 3.3*  CL 109 110 111 113*  CO2 23 26 26 27   GLUCOSE 179* 165* 94 178*  BUN 81* 78* 72* 53*  CREATININE 3.56* 3.40* 2.67* 2.01*  CALCIUM 9.3 8.7* 8.4* 8.1*  MG  --   --  2.2 2.1   GFR: CrCl cannot be calculated (Unknown ideal weight.). Liver Function Tests: Recent Labs  Lab 06/17/20 0200 06/17/20 0524 06/18/20 0334 06/19/20 0252  AST 44* 37 37 26  ALT 55* 48* 49* 40  ALKPHOS 156* 126 118 98  BILITOT 1.6* 0.9 0.9 1.1  PROT 7.1 5.7* 5.5* 5.0*  ALBUMIN 3.1* 2.4* 2.3* 2.1*   No results for input(s): LIPASE, AMYLASE in the last 168 hours. No results for input(s): AMMONIA in the last 168 hours. Coagulation Profile: No results for  input(s): INR, PROTIME in the last 168 hours. Cardiac Enzymes: No results for input(s): CKTOTAL, CKMB, CKMBINDEX, TROPONINI in the last 168 hours. BNP (last 3 results) No results for input(s): PROBNP in the last 8760 hours. HbA1C: Recent Labs    06/17/20 0524  HGBA1C 5.9*   CBG: Recent Labs  Lab 06/17/20 2139 06/18/20 0612 06/18/20 1553 06/18/20 2117 06/19/20 0630  GLUCAP 94 81 145* 148* 188*   Lipid Profile: No results for input(s): CHOL, HDL, LDLCALC, TRIG, CHOLHDL, LDLDIRECT in the last 72 hours. Thyroid Function Tests: No results for input(s): TSH, T4TOTAL, FREET4, T3FREE, THYROIDAB in the last 72 hours. Anemia Panel: No results for input(s): VITAMINB12, FOLATE, FERRITIN, TIBC, IRON, RETICCTPCT in the last 72 hours. Sepsis Labs: Recent Labs  Lab 06/17/20 0200 06/17/20 0524 06/18/20 0334  LATICACIDVEN 2.8* 2.2* 1.2    Recent Results (from the past 240 hour(s))  Respiratory Panel by RT PCR (Flu A&B, Covid) - Nasopharyngeal Swab     Status: None   Collection Time: 06/17/20  2:09 AM   Specimen: Nasopharyngeal Swab  Result Value Ref Range Status   SARS Coronavirus 2 by RT PCR NEGATIVE NEGATIVE Final    Comment: (NOTE) SARS-CoV-2 target nucleic acids are NOT DETECTED.  The SARS-CoV-2 RNA is generally  detectable in upper respiratoy specimens during the acute phase of infection. The lowest concentration of SARS-CoV-2 viral copies this assay can detect is 131 copies/mL. A negative result does not preclude SARS-Cov-2 infection and should not be used as the sole basis for treatment or other patient management decisions. A negative result may occur with  improper specimen collection/handling, submission of specimen other than nasopharyngeal swab, presence of viral mutation(s) within the areas targeted by this assay, and inadequate number of viral copies (<131 copies/mL). A negative result must be combined with clinical observations, patient history, and epidemiological  information. The expected result is Negative.  Fact Sheet for Patients:  PinkCheek.be  Fact Sheet for Healthcare Providers:  GravelBags.it  This test is no t yet approved or cleared by the [place] FDA and  has been authorized for detection and/or diagnosis of SARS-CoV-2 by FDA under an Emergency Use Authorization (EUA). This EUA will remain  in effect (meaning this test can be used) for the duration of the COVID-19 declaration under Section 564(b)(1) of the Act, 21 U.S.C. section 360bbb-3(b)(1), unless the authorization is terminated or revoked sooner.     Influenza A by PCR NEGATIVE NEGATIVE Final   Influenza B by PCR NEGATIVE NEGATIVE Final    Comment: (NOTE) The Xpert Xpress SARS-CoV-2/FLU/RSV assay is intended as an aid in  the diagnosis of influenza from Nasopharyngeal swab specimens and  should not be used as a sole basis for treatment. Nasal washings and  aspirates are unacceptable for Xpert Xpress SARS-CoV-2/FLU/RSV  testing.  Fact Sheet for Patients: PinkCheek.be  Fact Sheet for Healthcare Providers: GravelBags.it  This test is not yet approved or cleared by the [place] FDA and  has been authorized for detection and/or diagnosis of SARS-CoV-2 by  FDA under an Emergency Use Authorization (EUA). This EUA will remain  in effect (meaning this test can be used) for the duration of the  Covid-19 declaration under Section 564(b)(1) of the Act, 21  U.S.C. section 360bbb-3(b)(1), unless the authorization is  terminated or revoked. Performed at [facility], [place] [address], [place], [place] [address]   Urine culture     Status: Abnormal   Collection Time: 06/17/20  4:08 AM   Specimen: Urine, Random  Result Value Ref Range Status   Specimen Description URINE, RANDOM  Final   Special Requests   Final    NONE Performed at [facility], [place] [address] [place], [place] [address]    Culture >=100,000 COLONIES/mL YEAST (A)  Final   Report Status 06/18/2020 FINAL  Final  Culture, blood (single)     Status: None (Preliminary result)   Collection Time: 06/17/20  6:02 AM   Specimen: BLOOD  Result Value Ref Range Status   Specimen Description BLOOD RIGHT ANTECUBITAL  Final   Special Requests   Final    BOTTLES DRAWN AEROBIC AND ANAEROBIC Blood Culture adequate volume   Culture   Final    NO GROWTH 2 DAYS Performed at [facility], [place] [address] [place], [place] [address]    Report Status PENDING  Incomplete         Radiology Studies: No results found.      Scheduled Meds: . Chlorhexidine Gluconate Cloth  6 each Topical Daily  . finasteride  5 mg Oral Daily  . heparin  5,000 Units Subcutaneous Q8H  . insulin aspart  0-5 Units Subcutaneous QHS  . insulin aspart  0-9 Units Subcutaneous TID WC  .  latanoprost  1 drop Both Eyes QHS  . potassium chloride  30 mEq Oral Q3H  . sertraline  25 mg Oral Daily  . tamsulosin  0.4 mg Oral QPC supper   Continuous Infusions: . sodium chloride Stopped (06/19/20 0543)  . ceFEPime (MAXIPIME) IV Stopped (06/19/20 0458)  . dextrose 5 % and 0.45% NaCl 100 mL/hr at 06/19/20 0846     LOS: 2 days    Time spent: 36 minutes spent on chart review, discussion with nursing staff, consultants, updating family and interview/physical exam; more than 50% of that time was spent in counseling and/or coordination of care.    Vernis Cabacungan J [place] DO [facility] Available via Epic secure chat 7am-7pm After these hours, please refer to coverage provider listed on [email or web address] 06/19/2020, 10:18 AM

## 2020-06-19 NOTE — Progress Notes (Signed)
Telemetry notified this RN about pt HR sustaining in the 150s for about 1 min. HR then converted back to SR in the 80s. On call MD Chotiner notified.

## 2020-06-19 NOTE — Evaluation (Signed)
Physical Therapy Evaluation Patient Details Name: Erik Morales MRN: [id number] DOB: 1940-12-11 Today's Date: 06/19/2020   History of Present Illness  Patient is a 79 y/o male who was found unresponsive at his facility with AMS, poor oral intake and found to be in A-fib. Admitted with acute encephalopathy secondary to UTi, ARF with uremia and severe dehydration. PMH includes HTN, DM2, CKD IV, PAF, HLD.  Clinical Impression  Patient presents with lethargy, generalized weakness, ? LLE tone, impaired cognition and impaired mobility s/p above. Pt not able to provide information about PLOF/history due to current cognitive state and no family members present upon arrival. Per notes, pt is from a skilled nursing facility. Today, pt requires total A for bed mobility and resisting therapist when attempting to get EOB. Noted to have some increased tone in LLE vs resistance as well as pain with AAROM. Pt oriented to self only. Difficulty tracking therapist on left side. Also noted to be lethargic but follows some commands inconsistently with repetition. Would benefit from return to SNF to maximize independence and mobility prior to d/c. Will follow acutely.    Follow Up Recommendations SNF;Supervision for mobility/OOB;Supervision/Assistance - 24 hour    Equipment Recommendations  Other (comment) (defer to post acute)    Recommendations for Other Services       Precautions / Restrictions Precautions Precautions: Fall Restrictions Weight Bearing Restrictions: No      Mobility  Bed Mobility Overal bed mobility: Needs Assistance Bed Mobility: Supine to Sit;Sit to Supine     Supine to sit: Total assist;HOB elevated Sit to supine: Total assist;HOB elevated   General bed mobility comments: Assist with BLEs, bottom and trunk to get to EOB with pt resisting therapist to sit up, not able to get all the way sitting upright despite assist.    Transfers                 General transfer  comment: Deferred due to safety.  Ambulation/Gait                Stairs            Wheelchair Mobility    Modified Rankin (Stroke Patients Only)       Balance Overall balance assessment: Needs assistance Sitting-balance support: Feet unsupported;No upper extremity supported Sitting balance-Leahy Scale: Zero Sitting balance - Comments: total A for upright with pt pushing posteriorly vs resisting. Postural control: Posterior lean                                   Pertinent Vitals/Pain Pain Assessment: Faces Faces Pain Scale: Hurts whole lot Pain Location: LLE with movement Pain Descriptors / Indicators: Grimacing;Guarding;Sore;Aching Pain Intervention(s): Monitored during session;Repositioned;Limited activity within patient's tolerance    Home Living Family/patient expects to be discharged to:: Skilled nursing facility                      Prior Function                 Hand Dominance   Dominant Hand: Right    Extremity/Trunk Assessment   Upper Extremity Assessment Upper Extremity Assessment: Defer to OT evaluation    Lower Extremity Assessment Lower Extremity Assessment: LLE deficits/detail;RLE deficits/detail;Difficult to assess due to impaired cognition;Generalized weakness RLE Deficits / Details: Good ROM of ankle and knee. Not able to lift RLE off bed. LLE Deficits / Details: Increased tone limiting  knee flexion vs pt resisting therapist due to pain. Difficult to ascertain.       Communication   Communication: Expressive difficulties  Cognition Arousal/Alertness: Awake/alert Behavior During Therapy: Flat affect Overall Cognitive Status: Impaired/Different from baseline Area of Impairment: Orientation;Attention;Following commands;Awareness;Safety/judgement;Problem solving                 Orientation Level: Disoriented to;Place;Time;Situation Current Attention Level: Focused   Following Commands: Follows  one step commands inconsistently;Follows one step commands with increased time Safety/Judgement: Decreased awareness of deficits;Decreased awareness of safety   Problem Solving: Decreased initiation;Requires verbal cues;Requires tactile cues;Slow processing General Comments: Pt requires repetition to stay alert and follow commands. Does not gaze left when therapist is talking to pt on that side despite max cues. Mumbling voice difficult to understand at times. Thinks he is at home.      General Comments      Exercises     Assessment/Plan    PT Assessment Patient needs continued PT services  PT Problem List Decreased strength;Decreased mobility;Decreased safety awareness;Impaired tone;Decreased range of motion;Decreased activity tolerance;Decreased cognition;Pain;Decreased balance       PT Treatment Interventions Therapeutic activities;Gait training;Therapeutic exercise;Patient/family education;Cognitive remediation;Balance training;Wheelchair mobility training;Functional mobility training;DME instruction    PT Goals (Current goals can be found in the Care Plan section)  Acute Rehab PT Goals Patient Stated Goal: unable to state PT Goal Formulation: Patient unable to participate in goal setting Time For Goal Achievement: 07/03/20 Potential to Achieve Goals: Fair    Frequency Min 2X/week   Barriers to discharge   unsure    Co-evaluation               AM-PAC PT "6 Clicks" Mobility  Outcome Measure Help needed turning from your back to your side while in a flat bed without using bedrails?: Total Help needed moving from lying on your back to sitting on the side of a flat bed without using bedrails?: Total Help needed moving to and from a bed to a chair (including a wheelchair)?: Total Help needed standing up from a chair using your arms (e.g., wheelchair or bedside chair)?: Total Help needed to walk in hospital room?: Total Help needed climbing 3-5 steps with a railing? :  Total 6 Click Score: 6    End of Session   Activity Tolerance: Patient limited by pain;Other (comment) (cognition) Patient left: in bed;with call bell/phone within reach;with bed alarm set Nurse Communication: Mobility status;Need for lift equipment PT Visit Diagnosis: Pain;Muscle weakness (generalized) (M62.81) Pain - Right/Left: Left Pain - part of body: Leg    Time: 1209-1221 PT Time Calculation (min) (ACUTE ONLY): 12 min   Charges:   PT Evaluation $PT Eval Moderate Complexity: 1 Mod          Marisa Severin, PT, DPT Acute Rehabilitation Services Pager [phone] Office [phone]      Erik Morales 06/19/2020, 12:51 PM

## 2020-06-20 LAB — COMPREHENSIVE METABOLIC PANEL
ALT: 32 U/L (ref 0–44)
AST: 20 U/L (ref 15–41)
Albumin: 2 g/dL — ABNORMAL LOW (ref 3.5–5.0)
Alkaline Phosphatase: 89 U/L (ref 38–126)
Anion gap: 7 (ref 5–15)
BUN: 40 mg/dL — ABNORMAL HIGH (ref 8–23)
CO2: 25 mmol/L (ref 22–32)
Calcium: 7.9 mg/dL — ABNORMAL LOW (ref 8.9–10.3)
Chloride: 114 mmol/L — ABNORMAL HIGH (ref 98–111)
Creatinine, Ser: 1.7 mg/dL — ABNORMAL HIGH (ref 0.61–1.24)
GFR, Estimated: 40 mL/min — ABNORMAL LOW (ref >60)
Glucose, Bld: 164 mg/dL — ABNORMAL HIGH (ref 70–99)
Potassium: 3.6 mmol/L (ref 3.5–5.1)
Sodium: 146 mmol/L — ABNORMAL HIGH (ref 135–145)
Total Bilirubin: 0.8 mg/dL (ref 0.3–1.2)
Total Protein: 4.8 g/dL — ABNORMAL LOW (ref 6.5–8.1)

## 2020-06-20 LAB — MAGNESIUM: Magnesium: 2 mg/dL (ref 1.7–2.4)

## 2020-06-20 LAB — CBC
HCT: 32.2 % — ABNORMAL LOW (ref 39.0–52.0)
Hemoglobin: 9.8 g/dL — ABNORMAL LOW (ref 13.0–17.0)
MCH: 29.3 pg (ref 26.0–34.0)
MCHC: 30.4 g/dL (ref 30.0–36.0)
MCV: 96.4 fL (ref 80.0–100.0)
Platelets: 219 10*3/uL (ref 150–400)
RBC: 3.34 MIL/uL — ABNORMAL LOW (ref 4.22–5.81)
RDW: 13.2 % (ref 11.5–15.5)
WBC: 19.2 10*3/uL — ABNORMAL HIGH (ref 4.0–10.5)
nRBC: 0.2 % (ref 0.0–0.2)

## 2020-06-20 LAB — GLUCOSE, CAPILLARY
Glucose-Capillary: 145 mg/dL — ABNORMAL HIGH (ref 70–99)
Glucose-Capillary: 120 mg/dL — ABNORMAL HIGH (ref 70–99)
Glucose-Capillary: 110 mg/dL — ABNORMAL HIGH (ref 70–99)
Glucose-Capillary: 101 mg/dL — ABNORMAL HIGH (ref 70–99)

## 2020-06-20 MED ORDER — POTASSIUM CHLORIDE CRYS ER 20 MEQ PO TBCR
40.0000 meq | EXTENDED_RELEASE_TABLET | Freq: Once | ORAL | Status: AC
  Administered 2020-06-20: 40 meq via ORAL
  Filled 2020-06-20: qty 2

## 2020-06-20 NOTE — Progress Notes (Addendum)
PROGRESS NOTE    Erik Morales  YIF:[id number] DOB: 20-Mar-1941 DOA: 06/17/2020 PCP: [name], MD    Brief Narrative:  Erik Morales male with past medical history significant for essential hypertension, hyperlipidemia, CKD stage IIIb-4, chronic diastolic CHF, paroxysmal atrial fibrillation not on anticoagulation due to history of upper GI bleed secondary to duodenal ulcer, non-insulin-dependent type 2 diabetes mellitus, history of DVT who presented to the ED via EMS for altered mental status and poor oral intake.  Patient currently a resident at a nursing facility and was found to be unresponsive.  Patient also with obstructive uropathy with chronic Foley in place with no urine output noted in the Foley bag upon arrival.  Recently admitted to the hospital last month for encephalopathy secondary to UTI.  Unable to obtain history from patient as he was altered and somnolent.  In the ED, patient was afebrile tachycardic with heart rate up to 110s.  Hypotensive with SBP low as 80s.  Not hypoxic on room air.  WBC count 86.7 with neutrophilic predominance, hemoglobin 13.7, platelet 280, sodium 139, potassium 5.3, chloride 109, bicarb 23, BUN 81, creatinine 3.5 (baseline 1.4), glucose 179, AST 44, ALT 55, alkaline phosphatase 156, total bilirubin 1.6, lactic acid 2.8.  Urinalysis with large amount of leukoesterase, negative nitrate, greater than 50 WBCs.  SARS-CoV-2/rapid influenza negative.  Chest x-ray with no acute cardiopulmonary disease process.  Patient was started on broad-spectrum antibiotics with vancomycin, cefepime and metronidazole.  Patient was given 30 mL/kg fluid bolus per sepsis protocol.  TRH consulted for further evaluation and treatment.   Assessment & Plan:   Principal Problem:   Catheter-associated urinary tract infection (HCC) Active Problems:   Severe sepsis (HCC)   Acute-on-chronic kidney injury ([place])   Acute encephalopathy   Atrial fibrillation with rapid  ventricular response (HCC)  Acute metabolic encephalopathy Patient presenting from nursing facility with altered mental status, likely multifactorial in the setting of severe sepsis, catheter associated UTI, AKI with uremia and severe dehydration.  Patient continues to be somnolent but does respond appropriately to some command. --Continue treatment as outlined below  Severe sepsis secondary to catheter associated UTI Patient presenting from nursing facility with altered mental status, found to be tachycardic, hypertensive with SBP's as low as 80s.  WBC count at 67.2 with neutrophilic predominance with a lactic acid of 2.8.  Complicated by history of obstructive uropathy with indwelling Foley catheter in place.  Urinalysis with large leukoesterase, negative nitrate, greater than 50 WBCs. Patient received 30 mL/kg fluid bolus per sepsis protocol; with improvement of blood pressure.  Urine culture with >100K yeast --WBC 38.4>31.3>29.8>19.9>19.2 --Lactic acid 2.8>2.2>1.2 --Blood culture: no growth x 3 days --cefepime dc 11/1 given only yeast in urine culture --fluconazole 200mg  PO daily x 14 days --follow CBC daily  Acute renal failure on CKD stage IIIb Baseline creatinine 1.4.  Creatinine on presentation 3.56.  Likely secondary to prerenal azotemia versus ATN from severe dehydration/sepsis as above.  Renal ultrasound with increased echogenicity and renal cortical thinning consistent with chronic medical renal disease, no hydronephrosis or bladder distention. FeNa 0.5%; consistent with prerenal azotemia --Cr 3.56>3.40>2.67>2.01>1.70 --D5 1/2 NS at 100 mL's per hour --DC Foley catheter today for voiding trial, bladder scan at next void or in 6 hours if no void --Avoid nephrotoxins, renally dose all medications --Strict I's and O's --Follow renal function closely daily  Atrial fibrillation with RVR: resolved Patient with tachycardia with heart rate in the 110s on admission, likely provoked by  poor  oral intake in the setting of severe sepsis as above.  Received aggressive IV fluid hydration with improvement of heart rate.  Not on chronic anticoagulation due to history of GI bleed secondary to duodenal ulcer.  Was previously on a beta-blocker and amiodarone, that was discontinued during previous hospitalization due to bradycardia. --Continue to monitor on telemetry  Hypernatremia Sodium 149 presentation, likely secondary to severe dehydration. --Na 299>242>683 --Continue IV fluid hydration with D5 1/2 NS at 100 mL's per hour --Follow CMP daily  Hyperkalemia:resolved Potassium 5.3 on admission, likely secondary to AKI/dehydration. --K 5.3>4.0>3.8 --Holding home potassium supplement --Continue IV fluid hydration --Continue to monitor on telemetry --Repeat electrolytes in a.m.  Hypokalemia --K 3.6 this am, likely 2/2 poor oral intake and hyperK resolved as above given improving renal function --will replete today and repeat electrolytes in the am  Elevated LFTs: resolved Likely secondary to severe sepsis. --AST 44>37>26>20 --ALT 55>49>40>32 --Hold home statin, will resume on discharge --Follow CMP daily  History of essential hypertension Chronic diastolic congestive heart failure Patient follows with cardiology, Dr. Geraldo Pitter, last seen in office 04/14/2020. Was previously on amiodarone and beta-blocker, was discontinued during last admission for episodes of bradycardia  History of hyperlipidemia --Holding home pravastatin 20 mg p.o. daily secondary to mild elevation of LFTs --Continue monitor CMP daily  Noninsulin-dependent type 2 diabetes mellitus On glimepiride 1 mg p.o. daily at home --Hemoglobin A1c 6.4 on 04/14/2020, repeat pending --Hold oral hypoglycemics while inpatient --Insulin sliding scale for coverage while inpatient --CBGs before every meal/at bedtime  Depression: Continue sertraline 25 mg p.o. daily  BPH Obstructive uropathy --Foley catheter  exchanged on 06/17/2020 --resume home finasteride 5 mg p.o. daily, tamsulosin 0.4 mg daily now BP improved. --consider voiding trial tomorrow  Dysphagia Likely related to his encephalopathy/sepsis as above.  Seen by speech therapy with recommendations of dysphagia 1 pured diet with thin liquids. --Aspiration precautions  Weakness, deconditioning --PT/OT following, plan to return [facility] to [facility] health SNF when medically ready   DVT prophylaxis: Heparin Code Status: Full code Family Communication: Attempted to update patient's spouse, [name] via telephone this morning, unsuccessful with no answer.  Disposition Plan:  Status is: Inpatient  Remains inpatient appropriate because:Altered mental status, Unsafe d/c plan, IV treatments appropriate due to intensity of illness or inability to take PO and Inpatient level of care appropriate due to severity of illness   Dispo: The patient is from: SNF              Anticipated d/c is to: SNF              Anticipated d/c date is: 2 days              Patient currently is not medically stable to d/c.   Consultants:   None  Procedures:   Foley catheter exchange 06/17/2020  Foley catheter discontinued 06/20/2020  Antimicrobials:   Vancomycin 10/29  Metronidazole 10/29  Cefepime 10/29 - 11/1  Fluconazole 10/30>>   Subjective: Patient seen and examined bedside, lying in bed.  Sleeping but easily arousable.  Pleasantly confused.  No other complaints or concerns at this time.  Denies chest pain, no shortness of breath, no abdominal pain.  No acute events overnight per nursing staff.  No family present at bedside.  Objective: Vitals:   06/19/20 2020 06/19/20 2340 06/20/20 0356 06/20/20 0751  BP: 117/62 124/66 (!) 148/75 (!) 141/77  Pulse: 96 85 76 78  Resp: 18 18  18   Temp: 99.2 F (37.3 C)  98.5 F (36.9 C) 98.3 F (36.8 C) 98.2 F (36.8 C)  TempSrc: Oral Oral Oral   SpO2: 93% 94% 97% 98%    Intake/Output Summary (Last 24  hours) at 06/20/2020 1011 Last data filed at 06/20/2020 0900 Gross per 24 hour  Intake --  Output 730 ml  Net -730 ml   There were no vitals filed for this visit.  Examination:  General exam: Appears calm and comfortable, ill in appearance, noted hand mitt on right hand Respiratory system: Clear to auscultation. Respiratory effort normal.  Oxygenating well on room air Cardiovascular system: S1 & S2 heard, RRR. No JVD, murmurs, rubs, gallops or clicks. No pedal edema. Gastrointestinal system: Abdomen is nondistended, soft and nontender. No organomegaly or masses felt. Normal bowel sounds heard. Central nervous system: Alert, oriented to year (2021) but not person (President: wifes dad), place ("office desk") or situation. No focal neurological deficits. Extremities: Moves all extremities independently, noted hand mitt on right hand Skin: No rashes, lesions or ulcers Psychiatry: Depressed mood, flat affect.    Data Reviewed: I have personally reviewed following labs and imaging studies  CBC: Recent Labs  Lab 06/17/20 0200 06/17/20 0524 06/18/20 0334 06/19/20 0252 06/20/20 0124  WBC 38.4* 31.3* 29.8* 19.9* 19.2*  NEUTROABS 32.6*  --   --   --   --   HGB 13.7 11.4* 10.7* 10.1* 9.8*  HCT 45.7 38.0* 35.9* 33.5* 32.2*  MCV 96.8 97.4 96.5 96.0 96.4  PLT 280 219 215 200 580   Basic Metabolic Panel: Recent Labs  Lab 06/17/20 0200 06/17/20 0524 06/18/20 0334 06/19/20 0252 06/20/20 0124  NA 149* 149* 148* 148* 146*  K 5.3* 4.0 3.8 3.3* 3.6  CL 109 110 111 113* 114*  CO2 23 26 26 27 25   GLUCOSE 179* 165* 94 178* 164*  BUN 81* 78* 72* 53* 40*  CREATININE 3.56* 3.40* 2.67* 2.01* 1.70*  CALCIUM 9.3 8.7* 8.4* 8.1* 7.9*  MG  --   --  2.2 2.1 2.0   GFR: CrCl cannot be calculated (Unknown ideal weight.). Liver Function Tests: Recent Labs  Lab 06/17/20 0200 06/17/20 0524 06/18/20 0334 06/19/20 0252 06/20/20 0124  AST 44* 37 37 26 20  ALT 55* 48* 49* 40 32  ALKPHOS 156*  126 118 98 89  BILITOT 1.6* 0.9 0.9 1.1 0.8  PROT 7.1 5.7* 5.5* 5.0* 4.8*  ALBUMIN 3.1* 2.4* 2.3* 2.1* 2.0*   No results for input(s): LIPASE, AMYLASE in the last 168 hours. No results for input(s): AMMONIA in the last 168 hours. Coagulation Profile: No results for input(s): INR, PROTIME in the last 168 hours. Cardiac Enzymes: No results for input(s): CKTOTAL, CKMB, CKMBINDEX, TROPONINI in the last 168 hours. BNP (last 3 results) No results for input(s): PROBNP in the last 8760 hours. HbA1C: No results for input(s): HGBA1C in the last 72 hours. CBG: Recent Labs  Lab 06/19/20 0630 06/19/20 1236 06/19/20 1608 06/19/20 2246 06/20/20 0639  GLUCAP 188* 198* 170* 143* 145*   Lipid Profile: No results for input(s): CHOL, HDL, LDLCALC, TRIG, CHOLHDL, LDLDIRECT in the last 72 hours. Thyroid Function Tests: No results for input(s): TSH, T4TOTAL, FREET4, T3FREE, THYROIDAB in the last 72 hours. Anemia Panel: No results for input(s): VITAMINB12, FOLATE, FERRITIN, TIBC, IRON, RETICCTPCT in the last 72 hours. Sepsis Labs: Recent Labs  Lab 06/17/20 0200 06/17/20 0524 06/18/20 0334  LATICACIDVEN 2.8* 2.2* 1.2    Recent Results (from the past 240 hour(s))  Respiratory Panel by RT PCR (Flu A&B,  Covid) - Nasopharyngeal Swab     Status: None   Collection Time: 06/17/20  2:09 AM   Specimen: Nasopharyngeal Swab  Result Value Ref Range Status   SARS Coronavirus 2 by RT PCR NEGATIVE NEGATIVE Final    Comment: (NOTE) SARS-CoV-2 target nucleic acids are NOT DETECTED.  The SARS-CoV-2 RNA is generally detectable in upper respiratoy specimens during the acute phase of infection. The lowest concentration of SARS-CoV-2 viral copies this assay can detect is 131 copies/mL. A negative result does not preclude SARS-Cov-2 infection and should not be used as the sole basis for treatment or other patient management decisions. A negative result may occur with  improper specimen collection/handling,  submission of specimen other than nasopharyngeal swab, presence of viral mutation(s) within the areas targeted by this assay, and inadequate number of viral copies (<131 copies/mL). A negative result must be combined with clinical observations, patient history, and epidemiological information. The expected result is Negative.  Fact Sheet for Patients:  PinkCheek.be  Fact Sheet for Healthcare Providers:  GravelBags.it  This test is no t yet approved or cleared by the [place] FDA and  has been authorized for detection and/or diagnosis of SARS-CoV-2 by FDA under an Emergency Use Authorization (EUA). This EUA will remain  in effect (meaning this test can be used) for the duration of the COVID-19 declaration under Section 564(b)(1) of the Act, 21 U.S.C. section 360bbb-3(b)(1), unless the authorization is terminated or revoked sooner.     Influenza A by PCR NEGATIVE NEGATIVE Final   Influenza B by PCR NEGATIVE NEGATIVE Final    Comment: (NOTE) The Xpert Xpress SARS-CoV-2/FLU/RSV assay is intended as an aid in  the diagnosis of influenza from Nasopharyngeal swab specimens and  should not be used as a sole basis for treatment. Nasal washings and  aspirates are unacceptable for Xpert Xpress SARS-CoV-2/FLU/RSV  testing.  Fact Sheet for Patients: PinkCheek.be  Fact Sheet for Healthcare Providers: GravelBags.it  This test is not yet approved or cleared by the [place] FDA and  has been authorized for detection and/or diagnosis of SARS-CoV-2 by  FDA under an Emergency Use Authorization (EUA). This EUA will remain  in effect (meaning this test can be used) for the duration of the  Covid-19 declaration under Section 564(b)(1) of the Act, 21  U.S.C. section 360bbb-3(b)(1), unless the authorization is  terminated or revoked. Performed at [facility], [place] [address], [place], [place] [address]   Urine culture     Status: Abnormal   Collection Time: 06/17/20  4:08 AM   Specimen: Urine, Random  Result Value Ref Range Status   Specimen Description URINE, RANDOM  Final   Special Requests   Final    NONE Performed at [facility], [place] [address] [place], [place] [address]    Culture >=100,000 COLONIES/mL YEAST (A)  Final   Report Status 06/18/2020 FINAL  Final  Culture, blood (single)     Status: None (Preliminary result)   Collection Time: 06/17/20  6:02 AM   Specimen: BLOOD  Result Value Ref Range Status   Specimen Description BLOOD RIGHT ANTECUBITAL  Final   Special Requests   Final    BOTTLES DRAWN AEROBIC AND ANAEROBIC Blood Culture adequate volume   Culture   Final    NO GROWTH 3 DAYS Performed at [facility], [place] [address], [place], [place] [address]    Report Status PENDING  Incomplete  Radiology Studies: No results found.      Scheduled Meds: . Chlorhexidine Gluconate Cloth  6 each Topical Daily  . finasteride  5 mg Oral Daily  . fluconazole  200 mg Oral Daily  . heparin  5,000 Units Subcutaneous Q8H  . insulin aspart  0-5 Units Subcutaneous QHS  . insulin aspart  0-9 Units Subcutaneous TID WC  . latanoprost  1 drop Both Eyes QHS  . sertraline  25 mg Oral Daily  . tamsulosin  0.4 mg Oral QPC supper   Continuous Infusions: . sodium chloride Stopped (06/19/20 0543)  . ceFEPime (MAXIPIME) IV 2 g (06/20/20 0408)  . dextrose 5 % and 0.45% NaCl 100 mL/hr at 06/19/20 2022     LOS: 3 days    Time spent: 36 minutes spent on chart review, discussion with nursing staff, consultants, updating family and interview/physical exam; more than 50% of that time was spent in counseling and/or coordination of care.    Brittin Janik J [place] DO [facility] Available via Epic secure chat 7am-7pm After these hours, please refer to coverage provider listed on [email or web address] 06/20/2020, 10:11 AM

## 2020-06-20 NOTE — Progress Notes (Signed)
Pt's foley catheter removed at 0901. Still no void 6 hours later. Bladder scan shows 292 mL. MD notified. Received order to bladder scan again in 4 hours if pt. has still not voided.

## 2020-06-20 NOTE — TOC Initial Note (Signed)
Transition of Care [facility]) - Initial/Assessment Note    Patient Details  Name: Erik Morales MRN: [id number] Date of Birth: 1941/05/27  [facility]) CM/SW Contact:    Pollie Friar, RN Phone Number: 06/20/2020, 1:06 PM  Clinical Narrative:                 CM met with the patient and he is confused. CM reached out to [facility] to see who patients main contact is as [name] has asked for a call regarding the patient. Per [facility] [name] is a main contact. [place] also states that the patient was there for rehab and they are not going to accept him back.  CM called [name] and informed him of the above. [name] is pts BIL. Wife is also present for the phone call and she can not manage the patient at home. She also inquired about LT care options. CM encouraged the to speak to DSS as pt and spouse wont be able to afford LT care out of pocket. Spouse and BIL are interested to see if pt will qualify for some more rehab. They state he only used part of his medicare days at [facility]. They asked he be faxed out in the [facility] area. CM completed FL2 and faxed pt out.  TOC following.  Expected Discharge Plan: Skilled Nursing Facility Barriers to Discharge: Continued Medical Work up, SNF Pending bed offer   Patient Goals and CMS Choice   CMS Medicare.gov Compare Post Acute Care list provided to:: Patient Represenative (must comment) Choice offered to / list presented to : Spouse (and Brother in Sports coach)  Expected Discharge Plan and Services Expected Discharge Plan: [place] In-house Referral: Clinical Social Work Discharge Planning Services: CM Consult Post Acute Care Choice: [place] Living arrangements for the past 2 months: [place]                                      Prior Living Arrangements/Services Living arrangements for the past 2 months: [place] Lives with:: Facility Resident Patient  language and need for interpreter reviewed:: Yes Do you feel safe going back to the place where you live?: Yes      Need for Family Participation in Patient Care: Yes (Comment) Care giver support system in place?: No (comment)   Criminal Activity/Legal Involvement Pertinent to Current Situation/Hospitalization: No - Comment as needed  Activities of Daily Living      Permission Sought/Granted                  Emotional Assessment Appearance:: Appears stated age Attitude/Demeanor/Rapport: Complaining Affect (typically observed):  (confused) Orientation: : Oriented to Self   Psych Involvement: No (comment)  Admission diagnosis:  AKI (acute kidney injury) ([place]) [N17.9] Severe sepsis ([place]) [A41.9, R65.20] Altered mental status, unspecified altered mental status type [R41.82] Patient Active Problem List   Diagnosis Date Noted  . Catheter-associated urinary tract infection ([place]) 06/17/2020  . Atrial fibrillation with rapid ventricular response ([place]) 06/17/2020  . Altered mental status 05/12/2020  . Acute encephalopathy 05/10/2020  . Chronic deep vein thrombosis (DVT) of left lower extremity ([place]) 05/10/2020  . Sinus bradycardia 05/10/2020  . Nonsustained ventricular tachycardia ([place]) 09/04/2019  . PAF (paroxysmal atrial fibrillation) ([place]) 01/27/2019  . Diabetes mellitus due to underlying condition with unspecified complications ([place]) 30/86/5784  . Abnormality of  gait 12/05/2018  . Acute-on-chronic kidney injury ([place]) 10/13/2018  . Hypotension   . CKD (chronic kidney disease), stage IV ([place])   . Hypoalbuminemia due to protein-calorie malnutrition ([place])   . History of GI bleed   . Chronic diastolic congestive heart failure ([place])   . Debility 10/02/2018  . Leukocytosis   . Duodenal ulcer with hemorrhage   . Melena   . Acute blood loss anemia   . Pressure injury of skin 09/26/2018  . Hematochezia 09/25/2018  . AKI (acute kidney injury) ([place]) 09/25/2018  . Severe sepsis  ([place]) 09/25/2018  . Elevated troponin 09/25/2018  . Abnormal thyroid function test 09/25/2018  . HTN (hypertension) 09/25/2018  . E-coli UTI 09/25/2018  . CAP (community acquired pneumonia) 09/25/2018   PCP:  [name], MD Pharmacy:   [facility], [place] [address] Phone: [phone] Fax: [place], [place] [address] [place] [address] Phone: [phone] Fax: [phone]     Social Determinants of Health (SDOH) Interventions    Readmission Risk Interventions Readmission Risk Prevention Plan 05/20/2020  Transportation Screening Complete  PCP or Specialist Appt within 3-5 Days Complete  HRI or [place] Complete  Social Work Consult for [place] Planning/Counseling Complete  Palliative Care Screening Not Applicable  Medication Review Press photographer) Complete  Some recent data might be hidden

## 2020-06-20 NOTE — NC FL2 (Signed)
[place] LEVEL OF CARE SCREENING TOOL     IDENTIFICATION  Patient Name: Erik Morales Birthdate: [birth year] Sex: male Admission Date (Current Location): 06/17/2020  [facility] and [place] Number:  [employer] and Address:  [facility] [place] [place] [address] [place], [place] [address]      Provider Number: [id number]  Attending Physician Name and Address:  [place] Donnamarie Poag, DO  Relative Name and Phone Number:       Current Level of Care: Hospital Recommended Level of Care: [place] Prior Approval Number:    Date Approved/Denied:   PASRR Number: [id number]  Discharge Plan: SNF    Current Diagnoses: Patient Active Problem List   Diagnosis Date Noted  . Catheter-associated urinary tract infection ([place]) 06/17/2020  . Atrial fibrillation with rapid ventricular response ([place]) 06/17/2020  . Altered mental status 05/12/2020  . Acute encephalopathy 05/10/2020  . Chronic deep vein thrombosis (DVT) of left lower extremity ([place]) 05/10/2020  . Sinus bradycardia 05/10/2020  . Nonsustained ventricular tachycardia ([place]) 09/04/2019  . PAF (paroxysmal atrial fibrillation) ([place]) 01/27/2019  . Diabetes mellitus due to underlying condition with unspecified complications ([place]) 56/21/3086  . Abnormality of gait 12/05/2018  . Acute-on-chronic kidney injury ([place]) 10/13/2018  . Hypotension   . CKD (chronic kidney disease), stage IV ([place])   . Hypoalbuminemia due to protein-calorie malnutrition ([place])   . History of GI bleed   . Chronic diastolic congestive heart failure ([place])   . Debility 10/02/2018  . Leukocytosis   . Duodenal ulcer with hemorrhage   . Melena   . Acute blood loss anemia   . Pressure injury of skin 09/26/2018  . Hematochezia 09/25/2018  . AKI (acute kidney injury) ([place]) 09/25/2018  . Severe sepsis ([place]) 09/25/2018  . Elevated troponin 09/25/2018  . Abnormal thyroid function test 09/25/2018  . HTN (hypertension)  09/25/2018  . E-coli UTI 09/25/2018  . CAP (community acquired pneumonia) 09/25/2018    Orientation RESPIRATION BLADDER Height & Weight     Self  Normal Incontinent Weight:   Height:     BEHAVIORAL SYMPTOMS/MOOD NEUROLOGICAL BOWEL NUTRITION STATUS      Continent Diet (dysphagia 1 with thin liquids)  AMBULATORY STATUS COMMUNICATION OF NEEDS Skin   Extensive Assist Verbally Skin abrasions, Bruising (abrasions to buttocks/// bruising to bilat arms/ legs/ hands)                       Personal Care Assistance Level of Assistance  Bathing, Feeding, Dressing Bathing Assistance: Maximum assistance Feeding assistance: Limited assistance Dressing Assistance: Maximum assistance     Functional Limitations Info  Sight, Hearing, Speech Sight Info: Adequate Hearing Info: Adequate Speech Info: Adequate    SPECIAL CARE FACTORS FREQUENCY  PT (By licensed PT), OT (By licensed OT), Speech therapy     PT Frequency: 5x/wk OT Frequency: 5x/wk     Speech Therapy Frequency: 5x/wk      Contractures Contractures Info: Not present    Additional Factors Info  Code Status, Allergies, Psychotropic, Insulin Sliding Scale Code Status Info: Full Allergies Info: NKA Psychotropic Info: Zoloft 25 mg daily Insulin Sliding Scale Info: Novolog 0-9 units SQ three times a day/ Novolog 0-5 units SQ at bedtime       Current Medications (06/20/2020):  This is the current hospital active medication list Current Facility-Administered Medications  Medication Dose Route Frequency Provider Last Rate Last Admin  . 0.9 %  sodium chloride infusion  Intravenous PRN [place] Eric J, DO   Stopped at 06/19/20 0543  . ceFEPIme (MAXIPIME) 2 g in sodium chloride 0.9 % 100 mL IVPB  2 g Intravenous Q24H Shela Leff, MD 200 mL/hr at 06/20/20 0408 2 g at 06/20/20 0408  . Chlorhexidine Gluconate Cloth 2 % PADS 6 each  6 each Topical Daily [place] Eric J, DO   6 each at 06/19/20 1610  . finasteride (PROSCAR)  tablet 5 mg  5 mg Oral Daily [place] Donnamarie Poag, DO   5 mg at 06/20/20 1011  . fluconazole (DIFLUCAN) tablet 200 mg  200 mg Oral Daily [place] Donnamarie Poag, DO   200 mg at 06/20/20 1010  . heparin injection 5,000 Units  5,000 Units Subcutaneous Q8H Shela Leff, MD   5,000 Units at 06/20/20 0640  . HYDROcodone-acetaminophen (NORCO/VICODIN) 5-325 MG per tablet 1 tablet  1 tablet Oral Q6H PRN Chotiner, Yevonne Aline, MD   1 tablet at 06/19/20 0534  . insulin aspart (novoLOG) injection 0-5 Units  0-5 Units Subcutaneous QHS Shela Leff, MD      . insulin aspart (novoLOG) injection 0-9 Units  0-9 Units Subcutaneous TID WC Shela Leff, MD   1 Units at 06/20/20 0710  . latanoprost (XALATAN) 0.005 % ophthalmic solution 1 drop  1 drop Both Eyes QHS [place] Donnamarie Poag, DO   1 drop at 06/19/20 2316  . montelukast (SINGULAIR) tablet 10 mg  10 mg Oral QHS PRN [place] Donnamarie Poag, DO      . sertraline (ZOLOFT) tablet 25 mg  25 mg Oral Daily [place] Eric J, DO   25 mg at 06/20/20 1011  . tamsulosin (FLOMAX) capsule 0.4 mg  0.4 mg Oral QPC supper [place] Eric J, DO   0.4 mg at 06/19/20 9604     Discharge Medications: Please see discharge summary for a list of discharge medications.  Relevant Imaging Results:  Relevant Lab Results:   Additional Information SSN: [id number]  Pollie Friar, RN

## 2020-06-21 LAB — COMPREHENSIVE METABOLIC PANEL
ALT: 31 U/L (ref 0–44)
AST: 29 U/L (ref 15–41)
Albumin: 1.9 g/dL — ABNORMAL LOW (ref 3.5–5.0)
Alkaline Phosphatase: 90 U/L (ref 38–126)
Anion gap: 7 (ref 5–15)
BUN: 33 mg/dL — ABNORMAL HIGH (ref 8–23)
CO2: 25 mmol/L (ref 22–32)
Calcium: 8.1 mg/dL — ABNORMAL LOW (ref 8.9–10.3)
Chloride: 111 mmol/L (ref 98–111)
Creatinine, Ser: 1.55 mg/dL — ABNORMAL HIGH (ref 0.61–1.24)
GFR, Estimated: 45 mL/min — ABNORMAL LOW (ref >60)
Glucose, Bld: 110 mg/dL — ABNORMAL HIGH (ref 70–99)
Potassium: 4.1 mmol/L (ref 3.5–5.1)
Sodium: 143 mmol/L (ref 135–145)
Total Bilirubin: 0.6 mg/dL (ref 0.3–1.2)
Total Protein: 4.6 g/dL — ABNORMAL LOW (ref 6.5–8.1)

## 2020-06-21 LAB — CBC
HCT: 32.6 % — ABNORMAL LOW (ref 39.0–52.0)
Hemoglobin: 9.9 g/dL — ABNORMAL LOW (ref 13.0–17.0)
MCH: 29.4 pg (ref 26.0–34.0)
MCHC: 30.4 g/dL (ref 30.0–36.0)
MCV: 96.7 fL (ref 80.0–100.0)
Platelets: 215 10*3/uL (ref 150–400)
RBC: 3.37 MIL/uL — ABNORMAL LOW (ref 4.22–5.81)
RDW: 13.2 % (ref 11.5–15.5)
WBC: 19.2 10*3/uL — ABNORMAL HIGH (ref 4.0–10.5)
nRBC: 0.2 % (ref 0.0–0.2)

## 2020-06-21 LAB — GLUCOSE, CAPILLARY
Glucose-Capillary: 103 mg/dL — ABNORMAL HIGH (ref 70–99)
Glucose-Capillary: 103 mg/dL — ABNORMAL HIGH (ref 70–99)
Glucose-Capillary: 141 mg/dL — ABNORMAL HIGH (ref 70–99)
Glucose-Capillary: 145 mg/dL — ABNORMAL HIGH (ref 70–99)
Glucose-Capillary: 134 mg/dL — ABNORMAL HIGH (ref 70–99)

## 2020-06-21 LAB — SARS CORONAVIRUS 2 BY RT PCR (HOSPITAL ORDER, PERFORMED IN ~~LOC~~ HOSPITAL LAB): SARS Coronavirus 2: NEGATIVE

## 2020-06-21 LAB — MAGNESIUM: Magnesium: 1.9 mg/dL (ref 1.7–2.4)

## 2020-06-21 MED ORDER — DEXTROSE-NACL 5-0.45 % IV SOLN: INTRAVENOUS | Status: DC

## 2020-06-21 NOTE — Evaluation (Signed)
Occupational Therapy Evaluation Patient Details Name: Erik Morales MRN: [id number] DOB: Feb 15, 1941 Today's Date: 06/21/2020    History of Present Illness Patient is a 79 y/o male who was found unresponsive at his facility with AMS, poor oral intake and found to be in A-fib. Admitted with acute encephalopathy secondary to UTi, ARF with uremia and severe dehydration. PMH includes HTN, DM2, CKD IV, PAF, HLD.   Clinical Impression   PT admitted with AMS with encephalopathy UTI (+). Pt currently with functional limitiations due to the deficits listed below (see OT problem list). Pt currently requires total +2 total (A) for bed mobility. Pt able to static sit EOB unsupported for ~ 1 minute with posterior R lean bias. Pt tangential throughout the session and hallucination on things not in room.  Pt will benefit from skilled OT to increase their independence and safety with adls and balance to allow discharge SNF.     Follow Up Recommendations  SNF    Equipment Recommendations  Hospital bed;Wheelchair cushion (measurements OT);Wheelchair (measurements OT);Other (comment) (hoyer)    Recommendations for Other Services       Precautions / Restrictions Precautions Precautions: Fall      Mobility Bed Mobility Overal bed mobility: Needs Assistance Bed Mobility: Rolling;Sit to Supine Rolling: +2 for safety/equipment;+2 for physical assistance;Total assist   Supine to sit: +2 for safety/equipment;+2 for physical assistance;Total assist Sit to supine: +2 for safety/equipment;+2 for physical assistance;Total assist   General bed mobility comments: pt startles even with cues prior to movement. pt with strong pushing to the R side    Transfers                 General transfer comment: not attempted but tangling eob with knee extension attempted bil LE x5 each side     Balance                                           ADL either performed or assessed with clinical  judgement   ADL Overall ADL's : Needs assistance/impaired Eating/Feeding: Moderate assistance Eating/Feeding Details (indicate cue type and reason): pt requires total support static sitting with max cues for self feeding  Grooming: Wash/dry face;Moderate assistance Grooming Details (indicate cue type and reason): cues to wipe and pt initiates Upper Body Bathing: Maximal assistance   Lower Body Bathing: Total assistance   Upper Body Dressing : Maximal assistance   Lower Body Dressing: Total assistance         Toileting - Clothing Manipulation Details (indicate cue type and reason): incontinence and no awareness       General ADL Comments: pt sitting eob for SLP and OT session. pt with noted cognitive deficits including incontinence and no awareness. pt does take po trial with cues .      Vision   Additional Comments: able to acurrately place spoon in apple sauce     Perception     Praxis      Pertinent Vitals/Pain Pain Assessment: Faces Faces Pain Scale: Hurts little more Pain Location: back  Pain Descriptors / Indicators: Grimacing;Guarding Pain Intervention(s): Repositioned;Monitored during session     Hand Dominance Right   Extremity/Trunk Assessment Upper Extremity Assessment Upper Extremity Assessment: Generalized weakness;RUE deficits/detail;LUE deficits/detail RUE Deficits / Details: decrease shoulder flexion 70 degrees noted to have popping with movement LUE Deficits / Details: edema throughout entire arm and decrease use due  mitton covering hand. pt needs AROM of hand to reduce swelling and elevations   Lower Extremity Assessment Lower Extremity Assessment: Defer to PT evaluation   Cervical / Trunk Assessment Cervical / Trunk Assessment: Kyphotic (strong R lateral lean and curved spine toward R )   Communication Communication Communication: Expressive difficulties   Cognition Arousal/Alertness: Awake/alert Behavior During Therapy: Flat  affect Overall Cognitive Status: No family/caregiver present to determine baseline cognitive functioning                                 General Comments: pt able to follow 1 step command - "take two bites" pt unable to choose hospital vs School =- pt states "both its both" pt perseverates on things not present in room and reports "i am not going into that crowd" so question hallunications actively occuring    General Comments  increased risk for skin break down, needs turning every two hours and noted to have pressure spot on spine so positioned on side to releave pressure     Exercises     Shoulder Instructions      Home Living Family/patient expects to be discharged to:: Skilled nursing facility                                        Prior Functioning/Environment Level of Independence: Needs assistance                 OT Problem List: Decreased strength;Decreased activity tolerance;Impaired vision/perception;Decreased knowledge of use of DME or AE;Decreased safety awareness;Decreased knowledge of precautions;Pain;Obesity;Decreased cognition;Decreased coordination;Impaired balance (sitting and/or standing)      OT Treatment/Interventions: Self-care/ADL training;Therapeutic exercise;DME and/or AE instruction;Energy conservation;Therapeutic activities;Balance training;Patient/family education;Cognitive remediation/compensation    OT Goals(Current goals can be found in the care plan section) Acute Rehab OT Goals Patient Stated Goal: unable to state OT Goal Formulation: Patient unable to participate in goal setting Time For Goal Achievement: 07/05/20 Potential to Achieve Goals: Good  OT Frequency: Min 2X/week   Barriers to D/C:            Co-evaluation PT/OT/SLP Co-Evaluation/Treatment: Yes Reason for Co-Treatment: Necessary to address cognition/behavior during functional activity;For patient/therapist safety   OT goals addressed during  session: ADL's and self-care;Proper use of Adaptive equipment and DME      AM-PAC OT "6 Clicks" Daily Activity     Outcome Measure Help from another person eating meals?: Total Help from another person taking care of personal grooming?: Total Help from another person toileting, which includes using toliet, bedpan, or urinal?: Total Help from another person bathing (including washing, rinsing, drying)?: Total Help from another person to put on and taking off regular upper body clothing?: Total Help from another person to put on and taking off regular lower body clothing?: Total 6 Click Score: 6   End of Session Nurse Communication: Mobility status;Precautions  Activity Tolerance: Patient tolerated treatment well Patient left: in bed;with call bell/phone within reach;with bed alarm set  OT Visit Diagnosis: Unsteadiness on feet (R26.81);Muscle weakness (generalized) (M62.81);Pain                Time: 1510-1533 OT Time Calculation (min): 23 min Charges:  OT General Charges $OT Visit: 1 Visit OT Evaluation $OT Eval Moderate Complexity: 1 Mod   B[place]nn, OTR/L  Acute Rehabilitation Services Pager: [phone] Office: [phone] .  Jeri Modena 06/21/2020, 3:47 PM

## 2020-06-21 NOTE — Progress Notes (Signed)
PROGRESS NOTE    Erik Morales  GHW:[id number] DOB: Jun 06, 1941 DOA: 06/17/2020 PCP: [name], MD    Brief Narrative:  Erik Morales male with past medical history significant for essential hypertension, hyperlipidemia, CKD stage IIIb-4, chronic diastolic CHF, paroxysmal atrial fibrillation not on anticoagulation due to history of upper GI bleed secondary to duodenal ulcer, non-insulin-dependent type 2 diabetes mellitus, history of DVT who presented to the ED via EMS for altered mental status and poor oral intake.  Patient currently a resident at a nursing facility and was found to be unresponsive.  Patient also with obstructive uropathy with chronic Foley in place with no urine output noted in the Foley bag upon arrival.  Recently admitted to the hospital last month for encephalopathy secondary to UTI.  Unable to obtain history from patient as he was altered and somnolent.  In the ED, patient was afebrile tachycardic with heart rate up to 110s.  Hypotensive with SBP low as 80s.  Not hypoxic on room air.  WBC count 78.9 with neutrophilic predominance, hemoglobin 13.7, platelet 280, sodium 139, potassium 5.3, chloride 109, bicarb 23, BUN 81, creatinine 3.5 (baseline 1.4), glucose 179, AST 44, ALT 55, alkaline phosphatase 156, total bilirubin 1.6, lactic acid 2.8.  Urinalysis with large amount of leukoesterase, negative nitrate, greater than 50 WBCs.  SARS-CoV-2/rapid influenza negative.  Chest x-ray with no acute cardiopulmonary disease process.  Patient was started on broad-spectrum antibiotics with vancomycin, cefepime and metronidazole.  Patient was given 30 mL/kg fluid bolus per sepsis protocol.  TRH consulted for further evaluation and treatment.   Assessment & Plan:   Principal Problem:   Catheter-associated urinary tract infection (HCC) Active Problems:   Severe sepsis (HCC)   Acute-on-chronic kidney injury ([place])   Acute encephalopathy   Atrial fibrillation with rapid  ventricular response (HCC)  Acute metabolic encephalopathy Patient presenting from nursing facility with altered mental status, likely multifactorial in the setting of severe sepsis, catheter associated UTI, AKI with uremia and severe dehydration.  Patient continues to be somnolent but does respond appropriately to some command. --Continue treatment as outlined below  Severe sepsis secondary to catheter associated UTI Patient presenting from nursing facility with altered mental status, found to be tachycardic, hypertensive with SBP's as low as 80s.  WBC count at 38.1 with neutrophilic predominance with a lactic acid of 2.8.  Complicated by history of obstructive uropathy with indwelling Foley catheter in place.  Urinalysis with large leukoesterase, negative nitrate, greater than 50 WBCs. Patient received 30 mL/kg fluid bolus per sepsis protocol; with improvement of blood pressure.  Urine culture with >100K yeast --WBC 38.4>31.3>29.8>19.9>19.2>19.2 --Lactic acid 2.8>2.2>1.2 --Blood culture: no growth x 4 days --cefepime dc'd 11/1 given only yeast in urine culture --fluconazole 200mg  PO daily x 14 days --follow CBC daily  Acute renal failure on CKD stage IIIb Baseline creatinine 1.4.  Creatinine on presentation 3.56.  Likely secondary to prerenal azotemia versus ATN from severe dehydration/sepsis as above.  Renal ultrasound with increased echogenicity and renal cortical thinning consistent with chronic medical renal disease, no hydronephrosis or bladder distention. FeNa 0.5%; consistent with prerenal azotemia --Cr 3.56>3.40>2.67>2.01>1.70>1.55 --D5 1/2 NS at 100 mL's per hour --Voiding trial attempted 06/20/2020 unsuccessful as required 3 in and out catheterizations overnight, will replace Foley today --Avoid nephrotoxins, renally dose all medications --Strict I's and O's --Follow renal function closely daily  Atrial fibrillation with RVR: resolved Patient with tachycardia with heart rate in  the 110s on admission, likely provoked by poor oral intake  in the setting of severe sepsis as above.  Received aggressive IV fluid hydration with improvement of heart rate.  Not on chronic anticoagulation due to history of GI bleed secondary to duodenal ulcer.  Was previously on a beta-blocker and amiodarone, that was discontinued during previous hospitalization due to bradycardia. --Continue to monitor on telemetry  Hypernatremia Sodium 149 presentation, likely secondary to severe dehydration. --Na 161>096>045 --Continue IV fluid hydration with D5 1/2 NS at 100 mL's per hour --Follow CMP daily  Hyperkalemia:resolved Potassium 5.3 on admission, likely secondary to AKI/dehydration. --K 5.3>4.0>3.8 --Holding home potassium supplement --Continue IV fluid hydration --Continue to monitor on telemetry --Repeat electrolytes in a.m.  Hypokalemia --K 3.6 this am, likely 2/2 poor oral intake and hyperK resolved as above given improving renal function --will replete today and repeat electrolytes in the am  Elevated LFTs: resolved Likely secondary to severe sepsis. --AST 44>37>26>20 --ALT 55>49>40>32 --Hold home statin, will resume on discharge --Follow CMP daily  History of essential hypertension Chronic diastolic congestive heart failure Patient follows with cardiology, Dr. Geraldo Pitter, last seen in office 04/14/2020. Was previously on amiodarone and beta-blocker, was discontinued during last admission for episodes of bradycardia  History of hyperlipidemia --Holding home pravastatin 20 mg p.o. daily secondary to mild elevation of LFTs --Continue monitor CMP daily  Noninsulin-dependent type 2 diabetes mellitus On glimepiride 1 mg p.o. daily at home --Hemoglobin A1c 6.4 on 04/14/2020, repeat pending --Hold oral hypoglycemics while inpatient --Insulin sliding scale for coverage while inpatient --CBGs before every meal/at bedtime  Depression: Continue sertraline 25 mg p.o.  daily  BPH Obstructive uropathy --Foley catheter exchanged on 06/17/2020, discontinued on 06/20/2020 for voiding trial which was unsuccessful as he required 3 in and out catheterizations overnight, will replace Foley 11/2 --resume home finasteride 5 mg p.o. daily, tamsulosin 0.4 mg daily now BP improved.  Dysphagia Likely related to his encephalopathy/sepsis as above.  Seen by speech therapy with recommendations of dysphagia 1 pured diet with thin liquids. --Aspiration precautions  Weakness, deconditioning --PT/OT following, recommend SNF, [facility] not willing to accept back.  Transfer of care for coordination.   DVT prophylaxis: Heparin Code Status: Full code Family Communication: No family present at bedside this morning.  Disposition Plan:  Status is: Inpatient  Remains inpatient appropriate because:Altered mental status, Unsafe d/c plan, IV treatments appropriate due to intensity of illness or inability to take PO and Inpatient level of care appropriate due to severity of illness   Dispo: The patient is from: SNF [facility]              Anticipated d/c is to: SNF               Anticipated d/c date is: 1 day              Patient currently is not medically stable to d/c.   Consultants:   None  Procedures:   Foley catheter exchange 06/17/2020  Foley catheter discontinued 06/20/2020  Antimicrobials:   Vancomycin 10/29  Metronidazole 10/29  Cefepime 10/29 - 11/1  Fluconazole 10/30>>   Subjective: Patient seen and examined bedside, lying in bed.  Sleeping but easily arousable.  Pleasantly confused, believes he is currently in his garage.  No other complaints or concerns at this time.  Denies chest pain, no shortness of breath, no abdominal pain.  No acute events overnight per nursing staff.  No family present at bedside.  Objective: Vitals:   06/20/20 2029 06/21/20 0002 06/21/20 0328 06/21/20 0825  BP: 126/88  117/81 140/85 (!) 145/67  Pulse: (!) 50  84 79 68  Resp: 18 18 20 18   Temp: 98.4 F (36.9 C) 99 F (37.2 C) 98 F (36.7 C) 97.7 F (36.5 C)  TempSrc: Oral Oral Axillary Oral  SpO2: 99% 98% 98% 100%    Intake/Output Summary (Last 24 hours) at 06/21/2020 1130 Last data filed at 06/21/2020 1026 Gross per 24 hour  Intake 460 ml  Output 925 ml  Net -465 ml   There were no vitals filed for this visit.  Examination:  General exam: Appears calm and comfortable, ill in appearance, noted hand mitt on right hand Respiratory system: Clear to auscultation. Respiratory effort normal.  Oxygenating well on room air Cardiovascular system: S1 & S2 heard, RRR. No JVD, murmurs, rubs, gallops or clicks. No pedal edema. Gastrointestinal system: Abdomen is nondistended, soft and nontender. No organomegaly or masses felt. Normal bowel sounds heard. Central nervous system: Alert, oriented to year (2021) but not person (President: "Brewing technologist"), place ("my garage") or situation. No focal neurological deficits. Extremities: Moves all extremities independently, noted hand mitt on right hand Skin: No rashes, lesions or ulcers Psychiatry: Depressed mood, flat affect.    Data Reviewed: I have personally reviewed following labs and imaging studies  CBC: Recent Labs  Lab 06/17/20 0200 06/17/20 0200 06/17/20 0524 06/18/20 0334 06/19/20 0252 06/20/20 0124 06/21/20 0448  WBC 38.4*   < > 31.3* 29.8* 19.9* 19.2* 19.2*  NEUTROABS 32.6*  --   --   --   --   --   --   HGB 13.7   < > 11.4* 10.7* 10.1* 9.8* 9.9*  HCT 45.7   < > 38.0* 35.9* 33.5* 32.2* 32.6*  MCV 96.8   < > 97.4 96.5 96.0 96.4 96.7  PLT 280   < > 219 215 200 219 215   < > = values in this interval not displayed.   Basic Metabolic Panel: Recent Labs  Lab 06/17/20 0524 06/18/20 0334 06/19/20 0252 06/20/20 0124 06/21/20 0448  NA 149* 148* 148* 146* 143  K 4.0 3.8 3.3* 3.6 4.1  CL 110 111 113* 114* 111  CO2 26 26 27 25 25   GLUCOSE 165* 94 178* 164* 110*  BUN 78* 72* 53* 40*  33*  CREATININE 3.40* 2.67* 2.01* 1.70* 1.55*  CALCIUM 8.7* 8.4* 8.1* 7.9* 8.1*  MG  --  2.2 2.1 2.0 1.9   GFR: CrCl cannot be calculated (Unknown ideal weight.). Liver Function Tests: Recent Labs  Lab 06/17/20 0524 06/18/20 0334 06/19/20 0252 06/20/20 0124 06/21/20 0448  AST 37 37 26 20 29   ALT 48* 49* 40 32 31  ALKPHOS 126 118 98 89 90  BILITOT 0.9 0.9 1.1 0.8 0.6  PROT 5.7* 5.5* 5.0* 4.8* 4.6*  ALBUMIN 2.4* 2.3* 2.1* 2.0* 1.9*   No results for input(s): LIPASE, AMYLASE in the last 168 hours. No results for input(s): AMMONIA in the last 168 hours. Coagulation Profile: No results for input(s): INR, PROTIME in the last 168 hours. Cardiac Enzymes: No results for input(s): CKTOTAL, CKMB, CKMBINDEX, TROPONINI in the last 168 hours. BNP (last 3 results) No results for input(s): PROBNP in the last 8760 hours. HbA1C: No results for input(s): HGBA1C in the last 72 hours. CBG: Recent Labs  Lab 06/20/20 1109 06/20/20 1642 06/20/20 2153 06/21/20 0605 06/21/20 0844  GLUCAP 120* 110* 101* 103* 103*   Lipid Profile: No results for input(s): CHOL, HDL, LDLCALC, TRIG, CHOLHDL, LDLDIRECT in the last 72 hours. Thyroid  Function Tests: No results for input(s): TSH, T4TOTAL, FREET4, T3FREE, THYROIDAB in the last 72 hours. Anemia Panel: No results for input(s): VITAMINB12, FOLATE, FERRITIN, TIBC, IRON, RETICCTPCT in the last 72 hours. Sepsis Labs: Recent Labs  Lab 06/17/20 0200 06/17/20 0524 06/18/20 0334  LATICACIDVEN 2.8* 2.2* 1.2    Recent Results (from the past 240 hour(s))  Respiratory Panel by RT PCR (Flu A&B, Covid) - Nasopharyngeal Swab     Status: None   Collection Time: 06/17/20  2:09 AM   Specimen: Nasopharyngeal Swab  Result Value Ref Range Status   SARS Coronavirus 2 by RT PCR NEGATIVE NEGATIVE Final    Comment: (NOTE) SARS-CoV-2 target nucleic acids are NOT DETECTED.  The SARS-CoV-2 RNA is generally detectable in upper respiratoy specimens during the acute  phase of infection. The lowest concentration of SARS-CoV-2 viral copies this assay can detect is 131 copies/mL. A negative result does not preclude SARS-Cov-2 infection and should not be used as the sole basis for treatment or other patient management decisions. A negative result may occur with  improper specimen collection/handling, submission of specimen other than nasopharyngeal swab, presence of viral mutation(s) within the areas targeted by this assay, and inadequate number of viral copies (<131 copies/mL). A negative result must be combined with clinical observations, patient history, and epidemiological information. The expected result is Negative.  Fact Sheet for Patients:  PinkCheek.be  Fact Sheet for Healthcare Providers:  GravelBags.it  This test is no t yet approved or cleared by the [place] FDA and  has been authorized for detection and/or diagnosis of SARS-CoV-2 by FDA under an Emergency Use Authorization (EUA). This EUA will remain  in effect (meaning this test can be used) for the duration of the COVID-19 declaration under Section 564(b)(1) of the Act, 21 U.S.C. section 360bbb-3(b)(1), unless the authorization is terminated or revoked sooner.     Influenza A by PCR NEGATIVE NEGATIVE Final   Influenza B by PCR NEGATIVE NEGATIVE Final    Comment: (NOTE) The Xpert Xpress SARS-CoV-2/FLU/RSV assay is intended as an aid in  the diagnosis of influenza from Nasopharyngeal swab specimens and  should not be used as a sole basis for treatment. Nasal washings and  aspirates are unacceptable for Xpert Xpress SARS-CoV-2/FLU/RSV  testing.  Fact Sheet for Patients: PinkCheek.be  Fact Sheet for Healthcare Providers: GravelBags.it  This test is not yet approved or cleared by the [place] FDA and  has been authorized for detection and/or diagnosis of  SARS-CoV-2 by  FDA under an Emergency Use Authorization (EUA). This EUA will remain  in effect (meaning this test can be used) for the duration of the  Covid-19 declaration under Section 564(b)(1) of the Act, 21  U.S.C. section 360bbb-3(b)(1), unless the authorization is  terminated or revoked. Performed at [facility], [place] [address], [place], [place] [address]   Urine culture     Status: Abnormal   Collection Time: 06/17/20  4:08 AM   Specimen: Urine, Random  Result Value Ref Range Status   Specimen Description URINE, RANDOM  Final   Special Requests   Final    NONE Performed at [facility], [place] [address], [place], [place] [address]    Culture >=100,000 COLONIES/mL YEAST (A)  Final   Report Status 06/18/2020 FINAL  Final  Culture, blood (single)     Status: None (Preliminary result)   Collection Time: 06/17/20  6:02 AM   Specimen: BLOOD  Result Value Ref Range Status  Specimen Description BLOOD RIGHT ANTECUBITAL  Final   Special Requests   Final    BOTTLES DRAWN AEROBIC AND ANAEROBIC Blood Culture adequate volume   Culture   Final    NO GROWTH 4 DAYS Performed at [facility], [name], [place], [place] [address]    Report Status PENDING  Incomplete         Radiology Studies: No results found.      Scheduled Meds: . Chlorhexidine Gluconate Cloth  6 each Topical Daily  . finasteride  5 mg Oral Daily  . fluconazole  200 mg Oral Daily  . heparin  5,000 Units Subcutaneous Q8H  . insulin aspart  0-5 Units Subcutaneous QHS  . insulin aspart  0-9 Units Subcutaneous TID WC  . latanoprost  1 drop Both Eyes QHS  . sertraline  25 mg Oral Daily  . tamsulosin  0.4 mg Oral QPC supper   Continuous Infusions: . sodium chloride Stopped (06/19/20 0543)  . dextrose 5 % and 0.45% NaCl 100 mL/hr at 06/21/20 0746     LOS: 4 days    Time spent: 32 minutes spent on chart review, discussion with nursing staff, consultants, updating family and  interview/physical exam; more than 50% of that time was spent in counseling and/or coordination of care.    Brennley Curtice J [place] DO [facility] Available via Epic secure chat 7am-7pm After these hours, please refer to coverage provider listed on [email or web address] 06/21/2020, 11:30 AM

## 2020-06-21 NOTE — Progress Notes (Signed)
  Speech Language Pathology Treatment: Dysphagia  Patient Details Name: Erik Morales MRN: [id number] DOB: Jan 05, 1941 Today's Date: 06/21/2020 Time: 4627-0350 SLP Time Calculation (min) (ACUTE ONLY): 22 min  Assessment / Plan / Recommendation Clinical Impression  Pt was seen with OT to maximize arousal and safety with positioning during PO trials. Pt participated in self-feeding given trials of thin liquids and purees, needing moderate amounts of redirection back to task. He had a small amount of applesauce that would get caught in his mustache with each bite, falling down onto his gown without awareness, but no true anterior loss from his lips. He has no overt s/s of aspiration but advanced trials were not provided given mentation with reduced awareness. Pt told SLP that he has teeth but he appears to be edentulous - question if he has dentures and if this may help with the impaired mastication noted during initial evaluation. Recommend continuing Dys 1 diet and thin liquids with supervision for safety and to facilitate more intake.   HPI HPI: Pt is an 79 y.o. male with medical history significant of hypertension, hyperlipidemia, CKD stage IIIb-IV, chronic diastolic CHF, paroxysmal atrial fibrillation not on anticoagulation due to history of upper GI bleed secondary to duodenal ulcer, non-insulin-dependent type 2 diabetes, history of DVT who presented to the ED via EMS for evaluation of altered mental status and poor oral intake.  CXR was negative for active disease. Renal US: No acute abnormality.       SLP Plan  Continue with current plan of care       Recommendations  Diet recommendations: Dysphagia 1 (puree);Thin liquid Liquids provided via: Cup;Straw Medication Administration: Crushed with puree Supervision: Patient able to self feed;Full supervision/cueing for compensatory strategies Compensations: Slow rate;Small sips/bites Postural Changes and/or Swallow Maneuvers: Seated upright 90  degrees                Oral Care Recommendations: Oral care BID;Staff/trained caregiver to provide oral care Follow up Recommendations: Skilled Nursing facility SLP Visit Diagnosis: Dysphagia, unspecified (R13.10) Plan: Continue with current plan of care       GO                Osie Bond., M.A. [place] [facility] Pager [phone] Office [phone]  06/21/2020, 5:16 PM

## 2020-06-21 NOTE — Care Management Important Message (Signed)
Important Message  Patient Details  Name: Erik Morales MRN: [id number] Date of Birth: 1941/06/04   Medicare Important Message Given:  Yes     Brinae Woods [address] 06/21/2020, 3:51 PM

## 2020-06-21 NOTE — TOC Progression Note (Signed)
Transition of Care [facility]) - Progression Note    Patient Details  Name: Erik Morales MRN: [id number] Date of Birth: Apr 05, 1941  [facility] CM/SW Contact  Pollie Friar, RN Phone Number: 06/21/2020, 4:04 PM  Clinical Narrative:    Unknown [facility] offered a bed for SNF and brother in law accepted. CM has sent in information to [facility] for [facility] approval for rehab.  TOC following.   Expected Discharge Plan: Skilled Nursing Facility Barriers to Discharge: Continued Medical Work up, SNF Pending bed offer  Expected Discharge Plan and Services Expected Discharge Plan: [place] In-house Referral: Clinical Social Work Discharge Planning Services: CM Consult Post Acute Care Choice: [place] Living arrangements for the past 2 months: [place]                                       Social Determinants of Health (SDOH) Interventions    Readmission Risk Interventions Readmission Risk Prevention Plan 05/20/2020  Transportation Screening Complete  PCP or Specialist Appt within 3-5 Days Complete  HRI or Home Care Consult Complete  Social Work Consult for [place] Planning/Counseling Complete  Palliative Care Screening Not Applicable  Medication Review Press photographer) Complete  Some recent data might be hidden

## 2020-06-22 DIAGNOSIS — G9340 Encephalopathy, unspecified: Secondary | ICD-10-CM | POA: Diagnosis not present

## 2020-06-22 DIAGNOSIS — A419 Sepsis, unspecified organism: Secondary | ICD-10-CM | POA: Diagnosis not present

## 2020-06-22 DIAGNOSIS — I82502 Chronic embolism and thrombosis of unspecified deep veins of left lower extremity: Secondary | ICD-10-CM | POA: Diagnosis not present

## 2020-06-22 DIAGNOSIS — N179 Acute kidney failure, unspecified: Secondary | ICD-10-CM | POA: Diagnosis not present

## 2020-06-22 DIAGNOSIS — E870 Hyperosmolality and hypernatremia: Secondary | ICD-10-CM | POA: Diagnosis not present

## 2020-06-22 DIAGNOSIS — Z743 Need for continuous supervision: Secondary | ICD-10-CM | POA: Diagnosis not present

## 2020-06-22 DIAGNOSIS — R410 Disorientation, unspecified: Secondary | ICD-10-CM | POA: Diagnosis not present

## 2020-06-22 DIAGNOSIS — N390 Urinary tract infection, site not specified: Secondary | ICD-10-CM | POA: Diagnosis not present

## 2020-06-22 DIAGNOSIS — M6281 Muscle weakness (generalized): Secondary | ICD-10-CM | POA: Diagnosis not present

## 2020-06-22 DIAGNOSIS — M25552 Pain in left hip: Secondary | ICD-10-CM | POA: Diagnosis not present

## 2020-06-22 DIAGNOSIS — E785 Hyperlipidemia, unspecified: Secondary | ICD-10-CM | POA: Diagnosis not present

## 2020-06-22 DIAGNOSIS — M25551 Pain in right hip: Secondary | ICD-10-CM | POA: Diagnosis not present

## 2020-06-22 DIAGNOSIS — R1311 Dysphagia, oral phase: Secondary | ICD-10-CM | POA: Diagnosis not present

## 2020-06-22 DIAGNOSIS — Z7401 Bed confinement status: Secondary | ICD-10-CM | POA: Diagnosis not present

## 2020-06-22 DIAGNOSIS — I130 Hypertensive heart and chronic kidney disease with heart failure and stage 1 through stage 4 chronic kidney disease, or unspecified chronic kidney disease: Secondary | ICD-10-CM | POA: Diagnosis not present

## 2020-06-22 DIAGNOSIS — R0602 Shortness of breath: Secondary | ICD-10-CM | POA: Diagnosis not present

## 2020-06-22 DIAGNOSIS — R319 Hematuria, unspecified: Secondary | ICD-10-CM | POA: Diagnosis not present

## 2020-06-22 DIAGNOSIS — I4891 Unspecified atrial fibrillation: Secondary | ICD-10-CM | POA: Diagnosis not present

## 2020-06-22 DIAGNOSIS — M2550 Pain in unspecified joint: Secondary | ICD-10-CM | POA: Diagnosis not present

## 2020-06-22 DIAGNOSIS — R339 Retention of urine, unspecified: Secondary | ICD-10-CM | POA: Diagnosis not present

## 2020-06-22 DIAGNOSIS — R531 Weakness: Secondary | ICD-10-CM | POA: Diagnosis not present

## 2020-06-22 DIAGNOSIS — M6259 Muscle wasting and atrophy, not elsewhere classified, multiple sites: Secondary | ICD-10-CM | POA: Diagnosis not present

## 2020-06-22 DIAGNOSIS — T83511A Infection and inflammatory reaction due to indwelling urethral catheter, initial encounter: Secondary | ICD-10-CM | POA: Diagnosis not present

## 2020-06-22 DIAGNOSIS — N184 Chronic kidney disease, stage 4 (severe): Secondary | ICD-10-CM | POA: Diagnosis not present

## 2020-06-22 LAB — CULTURE, BLOOD (SINGLE)
Culture: NO GROWTH
Special Requests: ADEQUATE

## 2020-06-22 LAB — GLUCOSE, CAPILLARY
Glucose-Capillary: 138 mg/dL — ABNORMAL HIGH (ref 70–99)
Glucose-Capillary: 124 mg/dL — ABNORMAL HIGH (ref 70–99)
Glucose-Capillary: 153 mg/dL — ABNORMAL HIGH (ref 70–99)
Glucose-Capillary: 96 mg/dL (ref 70–99)

## 2020-06-22 LAB — COMPREHENSIVE METABOLIC PANEL
ALT: 32 U/L (ref 0–44)
AST: 33 U/L (ref 15–41)
Albumin: 1.9 g/dL — ABNORMAL LOW (ref 3.5–5.0)
Alkaline Phosphatase: 92 U/L (ref 38–126)
Anion gap: 8 (ref 5–15)
BUN: 25 mg/dL — ABNORMAL HIGH (ref 8–23)
CO2: 24 mmol/L (ref 22–32)
Calcium: 7.9 mg/dL — ABNORMAL LOW (ref 8.9–10.3)
Chloride: 108 mmol/L (ref 98–111)
Creatinine, Ser: 1.35 mg/dL — ABNORMAL HIGH (ref 0.61–1.24)
GFR, Estimated: 53 mL/min — ABNORMAL LOW (ref >60)
Glucose, Bld: 156 mg/dL — ABNORMAL HIGH (ref 70–99)
Potassium: 3.8 mmol/L (ref 3.5–5.1)
Sodium: 140 mmol/L (ref 135–145)
Total Bilirubin: 0.6 mg/dL (ref 0.3–1.2)
Total Protein: 4.8 g/dL — ABNORMAL LOW (ref 6.5–8.1)

## 2020-06-22 LAB — CBC
HCT: 33.6 % — ABNORMAL LOW (ref 39.0–52.0)
Hemoglobin: 10.2 g/dL — ABNORMAL LOW (ref 13.0–17.0)
MCH: 28.8 pg (ref 26.0–34.0)
MCHC: 30.4 g/dL (ref 30.0–36.0)
MCV: 94.9 fL (ref 80.0–100.0)
Platelets: 213 10*3/uL (ref 150–400)
RBC: 3.54 MIL/uL — ABNORMAL LOW (ref 4.22–5.81)
RDW: 13.4 % (ref 11.5–15.5)
WBC: 17.2 10*3/uL — ABNORMAL HIGH (ref 4.0–10.5)
nRBC: 0.1 % (ref 0.0–0.2)

## 2020-06-22 MED ORDER — LORAZEPAM 0.5 MG PO TABS: 0.5000 mg | ORAL_TABLET | Freq: Three times a day (TID) | ORAL | 0 refills | Status: DC | PRN

## 2020-06-22 MED ORDER — LORAZEPAM 0.5 MG PO TABS
0.5000 mg | ORAL_TABLET | Freq: Three times a day (TID) | ORAL | 0 refills | Status: DC | PRN
Start: 2020-06-22 — End: 2020-06-22

## 2020-06-22 MED ORDER — INSULIN ASPART 100 UNIT/ML ~~LOC~~ SOLN: 0.0000 [IU] | Freq: Three times a day (TID) | SUBCUTANEOUS | 11 refills | Status: DC

## 2020-06-22 MED ORDER — INSULIN ASPART 100 UNIT/ML ~~LOC~~ SOLN
0.0000 [IU] | Freq: Every day | SUBCUTANEOUS | 11 refills | Status: DC
Start: 2020-06-22 — End: 2024-06-17

## 2020-06-22 MED ORDER — FLUCONAZOLE 200 MG PO TABS: 400.0000 mg | ORAL_TABLET | Freq: Every day | ORAL | 0 refills | Status: AC

## 2020-06-22 MED ORDER — HYDROCODONE-ACETAMINOPHEN 5-325 MG PO TABS: 1.0000 | ORAL_TABLET | Freq: Four times a day (QID) | ORAL | 0 refills | Status: DC | PRN

## 2020-06-22 NOTE — Discharge Summary (Signed)
RAYCE BRAHMBHATT, is a 79 y.o. male  DOB [birth year]  MRN [id number].  Admission date:  06/17/2020  Admitting Physician  Shela Leff, MD  Discharge Date:  06/22/2020   Primary MD  [name], MD  Recommendations for primary care physician for things to follow:  -Please check CBC, CMP in 3 days. -Please attempt voiding trial in 7 days after patient is more ambulatory. -Please continue follow-up by SLP and advance diet as tolerated.  Patient on dysphagia 1 with thin liquid.   Admission Diagnosis  AKI (acute kidney injury) ([place]) [N17.9] Severe sepsis ([place]) [A41.9, R65.20] Altered mental status, unspecified altered mental status type [R41.82]   Discharge Diagnosis  AKI (acute kidney injury) ([place]) [N17.9] Severe sepsis ([place]) [A41.9, R65.20] Altered mental status, unspecified altered mental status type [R41.82]    Principal Problem:   Catheter-associated urinary tract infection ([place]) Active Problems:   Severe sepsis ([place])   Acute-on-chronic kidney injury ([place])   Acute encephalopathy   Atrial fibrillation with rapid ventricular response ([place])      Past Medical History:  Diagnosis Date  . Abnormal thyroid function test 09/25/2018  . Abnormality of gait 12/05/2018  . Acute blood loss anemia   . Acute on chronic renal failure ([place]) 10/13/2018  . AKI (acute kidney injury) ([place]) 09/25/2018  . CAP (community acquired pneumonia) 09/25/2018  . Chronic diastolic congestive heart failure ([place])   . Chronic kidney disease (CKD), stage III (moderate)   . Debility 10/02/2018  . Diabetes mellitus due to underlying condition with unspecified complications ([place]) 10/25/3666  . DM2 (diabetes mellitus, type 2) ([place])   . Duodenal ulcer with hemorrhage   . E-coli UTI 09/25/2018  . Elevated troponin 09/25/2018  . Hematochezia 09/25/2018  . History of GI bleed   . HLD (hyperlipidemia)   . HTN (hypertension)   .  Hypoalbuminemia due to protein-calorie malnutrition ([place])   . Hypotension   . Leukocytosis   . Melena   . Nonsustained ventricular tachycardia ([place]) 09/04/2019  . PAF (paroxysmal atrial fibrillation) ([place]) 01/27/2019  . Pressure injury of skin 09/26/2018  . Severe sepsis ([place]) 09/25/2018    Past Surgical History:  Procedure Laterality Date  . ESOPHAGOGASTRODUODENOSCOPY (EGD) WITH PROPOFOL N/A 09/27/2018   Procedure: ESOPHAGOGASTRODUODENOSCOPY (EGD) WITH PROPOFOL;  Surgeon: Doran Stabler, MD;  Location: [facility];  Service: Gastroenterology;  Laterality: N/A;  . HOT HEMOSTASIS N/A 09/27/2018   Procedure: HOT HEMOSTASIS (ARGON PLASMA COAGULATION/BICAP);  Surgeon: Doran Stabler, MD;  Location: [facility];  Service: Gastroenterology;  Laterality: N/A;  . SUBMUCOSAL INJECTION  09/27/2018   Procedure: SUBMUCOSAL INJECTION;  Surgeon: Doran Stabler, MD;  Location: [facility] END[facility]COPY;  Service: Gastroenterology;;       History of present illness and  Hospital Course:     Kindly see H&P for history of present illness and admission details, please review complete Labs, Consult reports and Test reports for all details in brief  HPI  from the history and physical done on the day of admission  06/17/2020  HPI: MARSHEL GOLUBSKI is a 79 y.o. male with medical history significant of hypertension, hyperlipidemia, CKD stage IIIb-IV, chronic diastolic CHF, paroxysmal atrial fibrillation not on anticoagulation due to history of upper GI bleed secondary to duodenal ulcer, non-insulin-dependent type 2 diabetes, history of DVT presenting to the ED via EMS for evaluation of altered mental status and poor oral intake.  EMS was called to his facility as patient was found unresponsive.  His facility reported that patient is currently being treated for UTI.  Reportedly his oxygen saturation was 77% on room air at his facility, improved to 88% with 2 L supplemental oxygen.  EMS placed nonrebreather in route to the  hospital.  Upon arrival to the ED, SPO2 98-100% on room air.  Patient noted to be in A. fib with rate 90-115.  No urine output in the Foley bag upon arrival. Patient was admitted to the hospital last month for encephalopathy secondary to UTI.  No history could be obtained from the patient as he was somnolent.  ED Course: Afebrile.  Tachycardic with heart rate up to 110s. Hypotensive with systolic as low as 16X.  Not hypoxic.  WBC 09.6 with neutrophilic predominance, hemoglobin 13.7, hematocrit 45.7, platelet 280K.  Sodium 149, potassium 5.3, chloride 109, bicarb 23, BUN 81, creatinine 3.5 (baseline 1.4), glucose 179.  AST 44, ALT 55, alk phos 156, T bili 1.6.  Lactic acid 2.8.  UA with large amount of leukocytes and greater than 50 WBCs.  Urine culture pending.  SARS-CoV-2 PCR test pending.  Influenza panel pending.  Chest x-ray not suggestive of pneumonia.  Patient was given broad-spectrum antibiotics including vancomycin, cefepime, and metronidazole.  30 cc/ Kg fluid boluses ordered per sepsis protocol.  Hospital Course   Acute metabolic encephalopathy Patient presenting from nursing facility with altered mental status, likely multifactorial in the setting of severe sepsis, catheter associated UTI, AKI with uremia and severe dehydration.  Mentation has significantly improved, follow commands and responding appropriately , he is with some confusion at baseline.  He is with some confusion at baseline.  Severe sepsis secondary to catheter associated UTI/POA Patient presenting from nursing facility with altered mental status, found to be tachycardic, hypertensive with SBP's as low as 80s.  WBC count at 04.5 with neutrophilic predominance with a lactic acid of 2.8.  Complicated by history of obstructive uropathy with indwelling Foley catheter in place.  Urinalysis with large leukoesterase, negative nitrate, greater than 50 WBCs. Patient received 30 mL/kg fluid bolus per sepsis protocol; with  improvement of blood pressure.  Urine culture with >100K yeast --Cytosis trending down, he is not sick appearing anymore, lactic acidosis has resolved, blood cultures with no growth x5 days, treated initially with cefepime which has been stopped after his urine culture growing yeast, discussed with ID Dr. Baxter Flattery, recommendation to treat with Diflucan 400 mg for 10 to 14 days.  He is to finish another 10 days as an outpatient.  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  Creatinine on presentation 3.56.  Likely secondary to prerenal azotemia from severe dehydration/sepsis as above.  Renal ultrasound with increased echogenicity and renal cortical thinning consistent with chronic medical renal disease, no hydronephrosis or bladder distention. FeNa 0.5%; consistent with prerenal azotemia, this has improved with IV fluid, creatinine 3.5 on admission, proved to 1.35 on discharge   Atrial fibrillation with RVR: resolved Patient with tachycardia with heart rate in the 110s on admission, likely provoked by poor oral intake in the  setting of severe sepsis as above.  Received aggressive IV fluid hydration with improvement of heart rate.  Not on chronic anticoagulation due to history of GI bleed secondary to duodenal ulcer.  Was previously on a beta-blocker and amiodarone, that was discontinued during previous hospitalization due to bradycardia.  Hypernatremia Sodium 149 presentation, likely secondary to severe dehydration.  This has resolved with IV hydration.,  It is 140 on discharge.  Hyperkalemia:resolved Potassium 5.3 on admission, likely secondary to AKI/dehydration. --K 5.3>4.0>3.8   Hypokalemia -repleted   Elevated LFTs: resolved Likely secondary to severe sepsis. --AST 44>37>26>20 --ALT 55>49>40>32  History of essential hypertension Chronic diastolic congestive heart failure Patient follows with cardiology, Dr. Geraldo Pitter, last seen in office 04/14/2020. Was previously  on amiodarone and beta-blocker, was discontinued during last admission for episodes of bradycardia  History of hyperlipidemia -Been held during hospital stay due to elevated LFTs, this has resolved, he is resumed on his statin on discharge.  Noninsulin-dependent type 2 diabetes mellitus On glimepiride 1 mg p.o. daily at home --Hemoglobin A1c 6.4 on 04/14/2020, he was kept on insulin sliding scale during hospital stay with good control, he will be discharged with sliding scale, glimepiride will be held on discharge..  Depression: Continue sertraline 25 mg p.o. daily  BPH Obstructive uropathy --Foley catheter exchanged on 06/17/2020, discontinued on 06/20/2020 for voiding trial which was unsuccessful as he required 3 in and out catheterizations overnight, foley reinserted 11/2. --resume home finasteride 5 mg p.o. daily, tamsulosin 0.4 mg daily now BP improved.  Dysphagia Likely related to his encephalopathy/sepsis as above.  Seen by speech therapy with recommendations of dysphagia 1 pured diet with thin liquids. --Aspiration precautions -Continue with SLP follow-up as an outpatient  Weakness, deconditioning --PT/OT following, recommend SNF    Discharge Condition:  stable   Follow UP   Contact information for after-discharge care    Destination    [facility] .   Service: Skilled Nursing Contact information: [place] [phone]                    Discharge Instructions  and  Discharge Medications    Discharge Instructions    Increase activity slowly   Complete by: As directed    No wound care   Complete by: As directed      Allergies as of 06/22/2020   No Known Allergies     Medication List    STOP taking these medications   ampicillin 500 MG capsule Commonly known as: PRINCIPEN   glimepiride 1 MG tablet Commonly known as: AMARYL   sulfamethoxazole-trimethoprim 800-160 MG tablet Commonly known as:  BACTRIM DS   traMADol 50 MG tablet Commonly known as: ULTRAM     TAKE these medications   acetaminophen 500 MG tablet Commonly known as: TYLENOL Take 500 mg by mouth every 6 (six) hours as needed for headache (pain).   albuterol 108 (90 Base) MCG/ACT inhaler Commonly known as: VENTOLIN HFA Inhale 1 puff into the lungs every 6 (six) hours as needed for shortness of breath.   ferrous sulfate 325 (65 FE) MG tablet Take 325 mg by mouth daily with breakfast.   finasteride 5 MG tablet Commonly known as: PROSCAR Take 1 tablet (5 mg total) by mouth daily. What changed: additional instructions   fluconazole 200 MG tablet Commonly known as: DIFLUCAN Take 2 tablets (400 mg total) by mouth daily for 10 days. Start taking on: June 23, 2020   fluticasone 50 MCG/ACT  nasal spray Commonly known as: FLONASE Place 2 sprays into both nostrils daily.   HYDROcodone-acetaminophen 5-325 MG tablet Commonly known as: NORCO/VICODIN Take 1 tablet by mouth every 6 (six) hours as needed for severe pain.   insulin aspart 100 UNIT/ML injection Commonly known as: novoLOG Inject 0-5 Units into the skin at bedtime.   insulin aspart 100 UNIT/ML injection Commonly known as: novoLOG Inject 0-9 Units into the skin 3 (three) times daily with meals.   latanoprost 0.005 % ophthalmic solution Commonly known as: XALATAN Place 1 drop into both eyes at bedtime.   LORazepam 0.5 MG tablet Commonly known as: ATIVAN Take 1 tablet (0.5 mg total) by mouth every 8 (eight) hours as needed for anxiety.   montelukast 10 MG tablet Commonly known as: SINGULAIR Take 1 tablet (10 mg total) by mouth at bedtime as needed (allergies).   pantoprazole 20 MG tablet Commonly known as: PROTONIX Take 20 mg by mouth 2 (two) times daily.   polyethylene glycol 17 g packet Commonly known as: MIRALAX / GLYCOLAX Take 17 g by mouth daily as needed for mild constipation.   potassium chloride 10 MEQ tablet Commonly known as:  KLOR-CON Take 10 mEq by mouth daily.   pravastatin 40 MG tablet Commonly known as: PRAVACHOL Take 0.5 tablets (20 mg total) by mouth at bedtime.   sertraline 25 MG tablet Commonly known as: ZOLOFT Take 25 mg by mouth daily.   tamsulosin 0.4 MG Caps capsule Commonly known as: FLOMAX Take 1 capsule (0.4 mg total) by mouth daily after supper. What changed: additional instructions   Vitamin D (Ergocalciferol) 1.25 MG (50000 UNIT) Caps capsule Commonly known as: DRISDOL Take 50,000 Units by mouth every 7 (seven) days.         Diet and Activity recommendation: See Discharge Instructions above   Consults obtained -  None   Major procedures and Radiology Reports - PLEASE review detailed and final reports for all details, in brief -     US RENAL  Result Date: 06/17/2020 CLINICAL DATA:  Acute kidney injury. EXAM: RENAL / URINARY TRACT ULTRASOUND COMPLETE COMPARISON:  CT 05/16/2020.  Ultrasound 09/22/2018. FINDINGS: Right Kidney: Renal measurements: 9.2 x 4.1 x 3.9 cm = volume: 77.9 mL. Renal cortical thinning. Increased echogenicity. No mass or hydronephrosis visualized. Left Kidney: Renal measurements: 8.2 x 3.7 x 4.1 cm = volume: 64.4 mL. Renal cortical thinning. Increased echogenicity. No mass or hydronephrosis visualized. Bladder: Foley catheter present.  Bladder nondistended. Other: None. IMPRESSION: 1. Increased echogenicity and renal cortical thinning noted bilaterally consistent with chronic medical renal disease. 2. No acute abnormality identified. No hydronephrosis or bladder distention. Electronically Signed   By: Marcello Moores  Register   On: 06/17/2020 05:17   DG Chest Port 1 View  Result Date: 06/17/2020 CLINICAL DATA:  Encephalopathy EXAM: PORTABLE CHEST 1 VIEW COMPARISON:  05/10/2020 FINDINGS: The heart size and mediastinal contours are within normal limits. Both lungs are clear. The visualized skeletal structures are unremarkable. IMPRESSION: No active disease.  Electronically Signed   By: Ulyses Jarred M.D.   On: 06/17/2020 02:24    Micro Results    Recent Results (from the past 240 hour(s))  Respiratory Panel by RT PCR (Flu A&B, Covid) - Nasopharyngeal Swab     Status: None   Collection Time: 06/17/20  2:09 AM   Specimen: Nasopharyngeal Swab  Result Value Ref Range Status   SARS Coronavirus 2 by RT PCR NEGATIVE NEGATIVE Final    Comment: (NOTE) SARS-CoV-2 target nucleic acids are  NOT DETECTED.  The SARS-CoV-2 RNA is generally detectable in upper respiratoy specimens during the acute phase of infection. The lowest concentration of SARS-CoV-2 viral copies this assay can detect is 131 copies/mL. A negative result does not preclude SARS-Cov-2 infection and should not be used as the sole basis for treatment or other patient management decisions. A negative result may occur with  improper specimen collection/handling, submission of specimen other than nasopharyngeal swab, presence of viral mutation(s) within the areas targeted by this assay, and inadequate number of viral copies (<131 copies/mL). A negative result must be combined with clinical observations, patient history, and epidemiological information. The expected result is Negative.  Fact Sheet for Patients:  PinkCheek.be  Fact Sheet for Healthcare Providers:  GravelBags.it  This test is no t yet approved or cleared by the [place] FDA and  has been authorized for detection and/or diagnosis of SARS-CoV-2 by FDA under an Emergency Use Authorization (EUA). This EUA will remain  in effect (meaning this test can be used) for the duration of the COVID-19 declaration under Section 564(b)(1) of the Act, 21 U.S.C. section 360bbb-3(b)(1), unless the authorization is terminated or revoked sooner.     Influenza A by PCR NEGATIVE NEGATIVE Final   Influenza B by PCR NEGATIVE NEGATIVE Final    Comment: (NOTE) The Xpert Xpress  SARS-CoV-2/FLU/RSV assay is intended as an aid in  the diagnosis of influenza from Nasopharyngeal swab specimens and  should not be used as a sole basis for treatment. Nasal washings and  aspirates are unacceptable for Xpert Xpress SARS-CoV-2/FLU/RSV  testing.  Fact Sheet for Patients: PinkCheek.be  Fact Sheet for Healthcare Providers: GravelBags.it  This test is not yet approved or cleared by the [place] FDA and  has been authorized for detection and/or diagnosis of SARS-CoV-2 by  FDA under an Emergency Use Authorization (EUA). This EUA will remain  in effect (meaning this test can be used) for the duration of the  Covid-19 declaration under Section 564(b)(1) of the Act, 21  U.S.C. section 360bbb-3(b)(1), unless the authorization is  terminated or revoked. Performed at [facility], [place] [address] [place], [place] [address]   Urine culture     Status: Abnormal   Collection Time: 06/17/20  4:08 AM   Specimen: Urine, Random  Result Value Ref Range Status   Specimen Description URINE, RANDOM  Final   Special Requests   Final    NONE Performed at [facility], [place] [address], [place], [place] [address]    Culture >=100,000 COLONIES/mL YEAST (A)  Final   Report Status 06/18/2020 FINAL  Final  Culture, blood (single)     Status: None   Collection Time: 06/17/20  6:02 AM   Specimen: BLOOD  Result Value Ref Range Status   Specimen Description BLOOD RIGHT ANTECUBITAL  Final   Special Requests   Final    BOTTLES DRAWN AEROBIC AND ANAEROBIC Blood Culture adequate volume   Culture   Final    NO GROWTH 5 DAYS Performed at [facility], [place] [address] [place],  [address]    Report Status 06/22/2020 FINAL  Final  SARS Coronavirus 2 by RT PCR (hospital order, performed in [facility] hospital lab) Nasopharyngeal Nasopharyngeal Swab     Status: None   Collection Time: 06/21/20  4:07 PM   Specimen:  Nasopharyngeal Swab  Result Value Ref Range Status   SARS Coronavirus 2 NEGATIVE NEGATIVE Final    Comment: (NOTE) SARS-CoV-2 target nucleic  acids are NOT DETECTED.  The SARS-CoV-2 RNA is generally detectable in upper and lower respiratory specimens during the acute phase of infection. The lowest concentration of SARS-CoV-2 viral copies this assay can detect is 250 copies / mL. A negative result does not preclude SARS-CoV-2 infection and should not be used as the sole basis for treatment or other patient management decisions.  A negative result may occur with improper specimen collection / handling, submission of specimen other than nasopharyngeal swab, presence of viral mutation(s) within the areas targeted by this assay, and inadequate number of viral copies (<250 copies / mL). A negative result must be combined with clinical observations, patient history, and epidemiological information.  Fact Sheet for Patients:   StrictlyIdeas.no  Fact Sheet for Healthcare Providers: BankingDealers.co.za  This test is not yet approved or  cleared by the [place] FDA and has been authorized for detection and/or diagnosis of SARS-CoV-2 by FDA under an Emergency Use Authorization (EUA).  This EUA will remain in effect (meaning this test can be used) for the duration of the COVID-19 declaration under Section 564(b)(1) of the Act, 21 U.S.C. section 360bbb-3(b)(1), unless the authorization is terminated or revoked sooner.  Performed at [facility], [place] [address], [place], [place] [address]        Today   Subjective:   Adon Gehlhausen today has no headache,no chest or  abdominal pain,  feels much better today.   Objective:   Blood pressure (!) 146/72, pulse 70, temperature 98.1 F (36.7 C), resp. rate 20, SpO2 99 %.   Intake/Output Summary (Last 24 hours) at 06/22/2020 1151 Last data filed at 06/22/2020 1045 Gross per 24 hour    Intake 841.12 ml  Output 475 ml  Net 366.12 ml    Exam Awake Alert, attentive, confused at baseline, frail and deconditioned Symmetrical Chest wall movement, Good air movement bilaterally, CTAB RRR,No Gallops,Rubs or new Murmurs, No Parasternal Heave +ve B.Sounds, Abd Soft, Non tender, No rebound -guarding or rigidity. No Cyanosis, Clubbing or edema, No new Rash or bruise  Data Review   CBC w Diff:  Lab Results  Component Value Date   WBC 17.2 (H) 06/22/2020   HGB 10.2 (L) 06/22/2020   HGB 13.0 04/14/2020   HCT 33.6 (L) 06/22/2020   HCT 40.2 04/14/2020   PLT 213 06/22/2020   PLT 210 04/14/2020   LYMPHOPCT 10 06/17/2020   MONOPCT 5 06/17/2020   EOSPCT 0 06/17/2020   BASOPCT 0 06/17/2020    CMP:  Lab Results  Component Value Date   NA 140 06/22/2020   NA 141 04/14/2020   K 3.8 06/22/2020   CL 108 06/22/2020   CO2 24 06/22/2020   BUN 25 (H) 06/22/2020   BUN 60 (H) 04/14/2020   CREATININE 1.35 (H) 06/22/2020   PROT 4.8 (L) 06/22/2020   PROT 6.8 04/14/2020   ALBUMIN 1.9 (L) 06/22/2020   ALBUMIN 4.4 04/14/2020   BILITOT 0.6 06/22/2020   BILITOT 0.4 04/14/2020   ALKPHOS 92 06/22/2020   AST 33 06/22/2020   ALT 32 06/22/2020  .   Total Time in preparing paper work, data evaluation and todays exam - 81 minutes  Phillips Climes M.D on 06/22/2020 at 11:51 AM  [facility]   Office  [phone]

## 2020-06-22 NOTE — TOC Transition Note (Signed)
Transition of Care [facility]) - CM/SW Discharge Note   Patient Details  Name: Erik Morales MRN: [id number] Date of Birth: 18-May-1941  [facility] CM/SW Contact:  Pollie Friar, RN Phone Number: 06/22/2020, 3:13 PM   Clinical Narrative:    Pt discharging to [facility] SN[facility] today. Auth received for 3 days: 11/3-11/5 Ref: [id number] Pt to transport via [facility]. Report called. D/c packet at the desk and bedside RN updated.   Room: 3B Number for report: [phone] and [facility]   Final next level of care: [place] Barriers to Discharge: No Barriers Identified   Patient Goals and CMS Choice   CMS Medicare.gov Compare Post Acute Care list provided to:: Patient Represenative (must comment) Choice offered to / list presented to :  (Brother in law)  Discharge Placement              Patient [facility] bed at: [facility] Patient to be transferred to facility by: [place] Name of family member notified: [place]--BIL Patient and family notified of of transfer: 06/22/20  Discharge Plan and Services In-house Referral: Clinical Social Work Discharge Planning Services: CM Consult Post Acute Care Choice: [place]                               Social Determinants of Health (SDOH) Interventions     Readmission Risk Interventions Readmission Risk Prevention Plan 05/20/2020  Transportation Screening Complete  PCP or Specialist Appt within 3-5 Days Complete  HRI or [place] Complete  Social Work Consult for [place] Planning/Counseling Complete  Palliative Care Screening Not Applicable  Medication Review Press photographer) Complete  Some recent data might be hidden

## 2020-06-22 NOTE — Progress Notes (Signed)
Report called to Nurse at [facility] to [facility]. All questions answered and this Probation officer informed Nurse that Erik Morales would be transporting patient once they arrived.

## 2020-06-22 NOTE — Discharge Instructions (Signed)
Follow with Primary MD [name], MD or SNF physician  Get CBC, CMP,checked  by Primary MD next visit.    Activity: As tolerated with Full fall precautions use walker/cane & assistance as needed   Disposition SNF   Diet: Dysphagia 1 with thin liquid  For Heart failure patients - Check your Weight same time everyday, if you gain over 2 pounds, or you develop in leg swelling, experience more shortness of breath or chest pain, call your Primary MD immediately. Follow Cardiac Low Salt Diet and 1.5 lit/day fluid restriction.   On your next visit with your primary care physician please Get Medicines reviewed and adjusted.   Please request your Prim.MD to go over all Hospital Tests and Procedure/Radiological results at the follow up, please get all Hospital records sent to your Prim MD by signing hospital release before you go home.   If you experience worsening of your admission symptoms, develop shortness of breath, life threatening emergency, suicidal or homicidal thoughts you must seek medical attention immediately by calling 911 or calling your MD immediately  if symptoms less severe.  You Must read complete instructions/literature along with all the possible adverse reactions/side effects for all the Medicines you take and that have been prescribed to you. Take any new Medicines after you have completely understood and accpet all the possible adverse reactions/side effects.   Do not drive, operating heavy machinery, perform activities at heights, swimming or participation in water activities or provide baby sitting services if your were admitted for syncope or siezures until you have seen by Primary MD or a Neurologist and advised to do so again.  Do not drive when taking Pain medications.    Do not take more than prescribed Pain, Sleep and Anxiety Medications  Special Instructions: If you have smoked or chewed Tobacco  in the last 2 yrs please stop smoking, stop any regular Alcohol   and or any Recreational drug use.  Wear Seat belts while driving.   Please note  You were cared for by a hospitalist during your hospital stay. If you have any questions about your discharge medications or the care you received while you were in the hospital after you are discharged, you can call the unit and asked to speak with the hospitalist on call if the hospitalist that took care of you is not available. Once you are discharged, your primary care physician will handle any further medical issues. Please note that NO REFILLS for any discharge medications will be authorized once you are discharged, as it is imperative that you return to your primary care physician (or establish a relationship with a primary care physician if you do not have one) for your aftercare needs so that they can reassess your need for medications and monitor your lab values.

## 2020-06-22 NOTE — Progress Notes (Signed)
Physical Therapy Treatment Patient Details Name: Erik Morales MRN: [id number] DOB: Feb 07, 1941 Today's Date: 06/22/2020    History of Present Illness Patient is a 79 y/o male who was found unresponsive at his facility with AMS, poor oral intake and found to be in A-fib. Admitted with acute encephalopathy secondary to UTi, ARF with uremia and severe dehydration. PMH includes HTN, DM2, CKD IV, PAF, HLD.    PT Comments    Pt refusing to participate in functional mobility, sitting EOB, or repositioning this date secondary to pain in LE or location of catheter insertion with movement. However, pt did agree to several LE exercises to maintain his ROM and some strength. Pt also appearing to be still actively hallucinating. Will continue to follow acutely and recommend SNF upon d/c to maximize his independence and safety with functional mobility through addressing his deficits in ROM, strength, balance, and endurance.   Follow Up Recommendations  SNF;Supervision for mobility/OOB;Supervision/Assistance - 24 hour     Equipment Recommendations  Other (comment) (defer to post acute)    Recommendations for Other Services       Precautions / Restrictions Precautions Precautions: Fall Precaution Comments: foley catheter Restrictions Weight Bearing Restrictions: No    Mobility  Bed Mobility Overal bed mobility: Needs Assistance Bed Mobility:  (refused)           General bed mobility comments: Pt refused despite multiple attempts to sit up EOB or change positions to decrease stiffness.  Transfers                 General transfer comment: not attempted due to safety concerns and pt refusal to participate  Ambulation/Gait                 Stairs             Wheelchair Mobility    Modified Rankin (Stroke Patients Only)       Balance                                            Cognition Arousal/Alertness: Awake/alert Behavior During Therapy:  Agitated Overall Cognitive Status: No family/caregiver present to determine baseline cognitive functioning Area of Impairment: Orientation;Attention;Following commands;Memory;Safety/judgement;Problem solving                 Orientation Level: Disoriented to;Place;Time;Situation (stated: year 3031, location in a "wooden cage") Current Attention Level: Focused Memory: Decreased short-term memory Following Commands: Follows one step commands inconsistently;Follows one step commands with increased time Safety/Judgement: Decreased awareness of safety;Decreased awareness of deficits   Problem Solving: Slow processing;Decreased initiation;Difficulty sequencing;Requires verbal cues;Requires tactile cues General Comments: Pt refused to sit EOB or participate in tasks that could result in "pain", and he was easily agitated with changes in position. Pt reported that he was located currently "in a wooden cage" and would talk about a "lever" being pulled next to his bed, so question active hallucinations.      Exercises General Exercises - Lower Extremity Ankle Circles/Pumps: AAROM;Right;10 reps Heel Slides: AAROM;Right;20 reps;Supine    General Comments General comments (skin integrity, edema, etc.): increased risk for skin breakdown and contractures, needs to be repositioned if pt allows it every 2 hours      Pertinent Vitals/Pain Pain Assessment: Faces Faces Pain Scale: Hurts whole lot Pain Location: location of catheter and L knee with PROM Pain Descriptors / Indicators: Grimacing;Guarding;Moaning Pain  Intervention(s): Limited activity within patient's tolerance;Monitored during session;Patient requesting pain meds-RN notified;Repositioned    Home Living                      Prior Function            PT Goals (current goals can now be found in the care plan section) Acute Rehab PT Goals Patient Stated Goal: "to get out of here" PT Goal Formulation: Patient unable to  participate in goal setting Time For Goal Achievement: 07/03/20 Potential to Achieve Goals: Fair Progress towards PT goals: Not progressing toward goals - comment (pt refusing to participate in functional mob due to pain)    Frequency    Min 2X/week      PT Plan Current plan remains appropriate    Co-evaluation              AM-PAC PT "6 Clicks" Mobility   Outcome Measure  Help needed turning from your back to your side while in a flat bed without using bedrails?: Total Help needed moving from lying on your back to sitting on the side of a flat bed without using bedrails?: Total Help needed moving to and from a bed to a chair (including a wheelchair)?: Total Help needed standing up from a chair using your arms (e.g., wheelchair or bedside chair)?: Total Help needed to walk in hospital room?: Total Help needed climbing 3-5 steps with a railing? : Total 6 Click Score: 6    End of Session   Activity Tolerance: Patient limited by pain;Treatment limited secondary to agitation Patient left: in bed;with call bell/phone within reach;with bed alarm set Nurse Communication: Mobility status;Patient requests pain meds PT Visit Diagnosis: Pain;Muscle weakness (generalized) (M62.81) Pain - Right/Left: Left (location of catheter) Pain - part of body: Leg (location of catheter)     Time: 1111-1130 PT Time Calculation (min) (ACUTE ONLY): 19 min  Charges:  $Therapeutic Exercise: 8-22 mins                     Moishe Spice, PT, DPT Acute Rehabilitation Services  Pager: [phone] Office: [place] 06/22/2020, 1:05 PM

## 2020-06-23 DIAGNOSIS — N390 Urinary tract infection, site not specified: Secondary | ICD-10-CM | POA: Diagnosis not present

## 2020-06-23 DIAGNOSIS — N179 Acute kidney failure, unspecified: Secondary | ICD-10-CM | POA: Diagnosis not present

## 2020-06-23 DIAGNOSIS — I4891 Unspecified atrial fibrillation: Secondary | ICD-10-CM | POA: Diagnosis not present

## 2020-06-23 DIAGNOSIS — A419 Sepsis, unspecified organism: Secondary | ICD-10-CM | POA: Diagnosis not present

## 2020-06-23 DIAGNOSIS — E870 Hyperosmolality and hypernatremia: Secondary | ICD-10-CM | POA: Diagnosis not present

## 2020-06-30 DIAGNOSIS — I130 Hypertensive heart and chronic kidney disease with heart failure and stage 1 through stage 4 chronic kidney disease, or unspecified chronic kidney disease: Secondary | ICD-10-CM | POA: Diagnosis not present

## 2020-06-30 DIAGNOSIS — E785 Hyperlipidemia, unspecified: Secondary | ICD-10-CM | POA: Diagnosis not present

## 2020-07-03 DIAGNOSIS — N390 Urinary tract infection, site not specified: Secondary | ICD-10-CM | POA: Diagnosis not present

## 2020-07-03 DIAGNOSIS — I4891 Unspecified atrial fibrillation: Secondary | ICD-10-CM | POA: Diagnosis not present

## 2020-07-03 DIAGNOSIS — R339 Retention of urine, unspecified: Secondary | ICD-10-CM | POA: Diagnosis not present

## 2020-07-04 DIAGNOSIS — R0602 Shortness of breath: Secondary | ICD-10-CM | POA: Diagnosis not present

## 2020-07-07 DIAGNOSIS — R319 Hematuria, unspecified: Secondary | ICD-10-CM | POA: Diagnosis not present

## 2020-07-15 DIAGNOSIS — K5730 Diverticulosis of large intestine without perforation or abscess without bleeding: Secondary | ICD-10-CM | POA: Diagnosis not present

## 2020-07-15 DIAGNOSIS — E119 Type 2 diabetes mellitus without complications: Secondary | ICD-10-CM | POA: Diagnosis not present

## 2020-07-15 DIAGNOSIS — E1165 Type 2 diabetes mellitus with hyperglycemia: Secondary | ICD-10-CM | POA: Diagnosis not present

## 2020-07-15 DIAGNOSIS — M2550 Pain in unspecified joint: Secondary | ICD-10-CM | POA: Diagnosis not present

## 2020-07-15 DIAGNOSIS — E1122 Type 2 diabetes mellitus with diabetic chronic kidney disease: Secondary | ICD-10-CM | POA: Diagnosis not present

## 2020-07-15 DIAGNOSIS — R509 Fever, unspecified: Secondary | ICD-10-CM | POA: Diagnosis not present

## 2020-07-15 DIAGNOSIS — Z87440 Personal history of urinary (tract) infections: Secondary | ICD-10-CM | POA: Diagnosis not present

## 2020-07-15 DIAGNOSIS — Z86718 Personal history of other venous thrombosis and embolism: Secondary | ICD-10-CM | POA: Diagnosis not present

## 2020-07-15 DIAGNOSIS — I480 Paroxysmal atrial fibrillation: Secondary | ICD-10-CM | POA: Diagnosis not present

## 2020-07-15 DIAGNOSIS — I5032 Chronic diastolic (congestive) heart failure: Secondary | ICD-10-CM | POA: Diagnosis not present

## 2020-07-15 DIAGNOSIS — I499 Cardiac arrhythmia, unspecified: Secondary | ICD-10-CM | POA: Diagnosis not present

## 2020-07-15 DIAGNOSIS — K8020 Calculus of gallbladder without cholecystitis without obstruction: Secondary | ICD-10-CM | POA: Diagnosis not present

## 2020-07-15 DIAGNOSIS — Z7401 Bed confinement status: Secondary | ICD-10-CM | POA: Diagnosis not present

## 2020-07-15 DIAGNOSIS — N183 Chronic kidney disease, stage 3 unspecified: Secondary | ICD-10-CM | POA: Diagnosis not present

## 2020-07-15 DIAGNOSIS — G9340 Encephalopathy, unspecified: Secondary | ICD-10-CM | POA: Diagnosis not present

## 2020-07-15 DIAGNOSIS — T83511A Infection and inflammatory reaction due to indwelling urethral catheter, initial encounter: Secondary | ICD-10-CM | POA: Diagnosis not present

## 2020-07-15 DIAGNOSIS — E870 Hyperosmolality and hypernatremia: Secondary | ICD-10-CM | POA: Diagnosis not present

## 2020-07-15 DIAGNOSIS — F32A Depression, unspecified: Secondary | ICD-10-CM | POA: Diagnosis not present

## 2020-07-15 DIAGNOSIS — R404 Transient alteration of awareness: Secondary | ICD-10-CM | POA: Diagnosis not present

## 2020-07-15 DIAGNOSIS — E876 Hypokalemia: Secondary | ICD-10-CM | POA: Diagnosis not present

## 2020-07-15 DIAGNOSIS — R000 Tachycardia, unspecified: Secondary | ICD-10-CM | POA: Diagnosis not present

## 2020-07-15 DIAGNOSIS — N139 Obstructive and reflux uropathy, unspecified: Secondary | ICD-10-CM | POA: Diagnosis not present

## 2020-07-15 DIAGNOSIS — R6520 Severe sepsis without septic shock: Secondary | ICD-10-CM | POA: Diagnosis not present

## 2020-07-15 DIAGNOSIS — Z743 Need for continuous supervision: Secondary | ICD-10-CM | POA: Diagnosis not present

## 2020-07-15 DIAGNOSIS — L89152 Pressure ulcer of sacral region, stage 2: Secondary | ICD-10-CM | POA: Diagnosis not present

## 2020-07-15 DIAGNOSIS — G9341 Metabolic encephalopathy: Secondary | ICD-10-CM | POA: Diagnosis not present

## 2020-07-15 DIAGNOSIS — A419 Sepsis, unspecified organism: Secondary | ICD-10-CM | POA: Diagnosis not present

## 2020-07-15 DIAGNOSIS — A4159 Other Gram-negative sepsis: Secondary | ICD-10-CM | POA: Diagnosis not present

## 2020-07-15 DIAGNOSIS — Z66 Do not resuscitate: Secondary | ICD-10-CM | POA: Diagnosis not present

## 2020-07-15 DIAGNOSIS — S0083XA Contusion of other part of head, initial encounter: Secondary | ICD-10-CM | POA: Diagnosis not present

## 2020-07-15 DIAGNOSIS — N179 Acute kidney failure, unspecified: Secondary | ICD-10-CM | POA: Diagnosis not present

## 2020-07-15 DIAGNOSIS — I130 Hypertensive heart and chronic kidney disease with heart failure and stage 1 through stage 4 chronic kidney disease, or unspecified chronic kidney disease: Secondary | ICD-10-CM | POA: Diagnosis not present

## 2020-07-15 DIAGNOSIS — K449 Diaphragmatic hernia without obstruction or gangrene: Secondary | ICD-10-CM | POA: Diagnosis not present

## 2020-07-15 DIAGNOSIS — M4856XA Collapsed vertebra, not elsewhere classified, lumbar region, initial encounter for fracture: Secondary | ICD-10-CM | POA: Diagnosis not present

## 2020-07-15 DIAGNOSIS — B964 Proteus (mirabilis) (morganii) as the cause of diseases classified elsewhere: Secondary | ICD-10-CM | POA: Diagnosis not present

## 2020-07-15 DIAGNOSIS — S22080A Wedge compression fracture of T11-T12 vertebra, initial encounter for closed fracture: Secondary | ICD-10-CM | POA: Diagnosis not present

## 2020-07-15 DIAGNOSIS — N390 Urinary tract infection, site not specified: Secondary | ICD-10-CM | POA: Diagnosis not present

## 2020-07-15 DIAGNOSIS — R5381 Other malaise: Secondary | ICD-10-CM | POA: Diagnosis not present

## 2020-07-22 DIAGNOSIS — M6281 Muscle weakness (generalized): Secondary | ICD-10-CM | POA: Diagnosis not present

## 2020-07-22 DIAGNOSIS — N390 Urinary tract infection, site not specified: Secondary | ICD-10-CM | POA: Diagnosis not present

## 2020-07-22 DIAGNOSIS — M6258 Muscle wasting and atrophy, not elsewhere classified, other site: Secondary | ICD-10-CM | POA: Diagnosis not present

## 2020-07-23 DIAGNOSIS — M6281 Muscle weakness (generalized): Secondary | ICD-10-CM | POA: Diagnosis not present

## 2020-07-23 DIAGNOSIS — N390 Urinary tract infection, site not specified: Secondary | ICD-10-CM | POA: Diagnosis not present

## 2020-07-23 DIAGNOSIS — M6258 Muscle wasting and atrophy, not elsewhere classified, other site: Secondary | ICD-10-CM | POA: Diagnosis not present

## 2020-07-24 DIAGNOSIS — M6258 Muscle wasting and atrophy, not elsewhere classified, other site: Secondary | ICD-10-CM | POA: Diagnosis not present

## 2020-07-24 DIAGNOSIS — N390 Urinary tract infection, site not specified: Secondary | ICD-10-CM | POA: Diagnosis not present

## 2020-07-24 DIAGNOSIS — M6281 Muscle weakness (generalized): Secondary | ICD-10-CM | POA: Diagnosis not present

## 2020-07-25 DIAGNOSIS — N390 Urinary tract infection, site not specified: Secondary | ICD-10-CM | POA: Diagnosis not present

## 2020-07-25 DIAGNOSIS — M6258 Muscle wasting and atrophy, not elsewhere classified, other site: Secondary | ICD-10-CM | POA: Diagnosis not present

## 2020-07-25 DIAGNOSIS — E870 Hyperosmolality and hypernatremia: Secondary | ICD-10-CM | POA: Diagnosis not present

## 2020-07-25 DIAGNOSIS — M6281 Muscle weakness (generalized): Secondary | ICD-10-CM | POA: Diagnosis not present

## 2020-07-25 DIAGNOSIS — R339 Retention of urine, unspecified: Secondary | ICD-10-CM | POA: Diagnosis not present

## 2020-07-25 DIAGNOSIS — I4891 Unspecified atrial fibrillation: Secondary | ICD-10-CM | POA: Diagnosis not present

## 2020-07-25 DIAGNOSIS — I509 Heart failure, unspecified: Secondary | ICD-10-CM | POA: Diagnosis not present

## 2020-07-26 DIAGNOSIS — M6281 Muscle weakness (generalized): Secondary | ICD-10-CM | POA: Diagnosis not present

## 2020-07-26 DIAGNOSIS — N390 Urinary tract infection, site not specified: Secondary | ICD-10-CM | POA: Diagnosis not present

## 2020-07-26 DIAGNOSIS — M6258 Muscle wasting and atrophy, not elsewhere classified, other site: Secondary | ICD-10-CM | POA: Diagnosis not present

## 2020-07-27 DIAGNOSIS — M6281 Muscle weakness (generalized): Secondary | ICD-10-CM | POA: Diagnosis not present

## 2020-07-27 DIAGNOSIS — I130 Hypertensive heart and chronic kidney disease with heart failure and stage 1 through stage 4 chronic kidney disease, or unspecified chronic kidney disease: Secondary | ICD-10-CM | POA: Diagnosis not present

## 2020-07-27 DIAGNOSIS — R609 Edema, unspecified: Secondary | ICD-10-CM | POA: Diagnosis not present

## 2020-07-27 DIAGNOSIS — M6258 Muscle wasting and atrophy, not elsewhere classified, other site: Secondary | ICD-10-CM | POA: Diagnosis not present

## 2020-07-27 DIAGNOSIS — N390 Urinary tract infection, site not specified: Secondary | ICD-10-CM | POA: Diagnosis not present

## 2020-07-28 DIAGNOSIS — M6281 Muscle weakness (generalized): Secondary | ICD-10-CM | POA: Diagnosis not present

## 2020-07-28 DIAGNOSIS — M6258 Muscle wasting and atrophy, not elsewhere classified, other site: Secondary | ICD-10-CM | POA: Diagnosis not present

## 2020-07-28 DIAGNOSIS — N390 Urinary tract infection, site not specified: Secondary | ICD-10-CM | POA: Diagnosis not present

## 2020-07-29 DIAGNOSIS — M6281 Muscle weakness (generalized): Secondary | ICD-10-CM | POA: Diagnosis not present

## 2020-07-29 DIAGNOSIS — N390 Urinary tract infection, site not specified: Secondary | ICD-10-CM | POA: Diagnosis not present

## 2020-07-29 DIAGNOSIS — M6258 Muscle wasting and atrophy, not elsewhere classified, other site: Secondary | ICD-10-CM | POA: Diagnosis not present

## 2020-08-01 DIAGNOSIS — M6281 Muscle weakness (generalized): Secondary | ICD-10-CM | POA: Diagnosis not present

## 2020-08-01 DIAGNOSIS — M6258 Muscle wasting and atrophy, not elsewhere classified, other site: Secondary | ICD-10-CM | POA: Diagnosis not present

## 2020-08-01 DIAGNOSIS — N390 Urinary tract infection, site not specified: Secondary | ICD-10-CM | POA: Diagnosis not present

## 2020-08-02 DIAGNOSIS — M6258 Muscle wasting and atrophy, not elsewhere classified, other site: Secondary | ICD-10-CM | POA: Diagnosis not present

## 2020-08-02 DIAGNOSIS — N390 Urinary tract infection, site not specified: Secondary | ICD-10-CM | POA: Diagnosis not present

## 2020-08-02 DIAGNOSIS — M6281 Muscle weakness (generalized): Secondary | ICD-10-CM | POA: Diagnosis not present

## 2020-08-03 DIAGNOSIS — I82502 Chronic embolism and thrombosis of unspecified deep veins of left lower extremity: Secondary | ICD-10-CM | POA: Diagnosis not present

## 2020-08-03 DIAGNOSIS — G9340 Encephalopathy, unspecified: Secondary | ICD-10-CM | POA: Diagnosis not present

## 2020-08-03 DIAGNOSIS — N390 Urinary tract infection, site not specified: Secondary | ICD-10-CM | POA: Diagnosis not present

## 2020-08-03 DIAGNOSIS — R627 Adult failure to thrive: Secondary | ICD-10-CM | POA: Diagnosis not present

## 2020-08-03 DIAGNOSIS — M6281 Muscle weakness (generalized): Secondary | ICD-10-CM | POA: Diagnosis not present

## 2020-08-03 DIAGNOSIS — Z79899 Other long term (current) drug therapy: Secondary | ICD-10-CM | POA: Diagnosis not present

## 2020-08-03 DIAGNOSIS — N184 Chronic kidney disease, stage 4 (severe): Secondary | ICD-10-CM | POA: Diagnosis not present

## 2020-08-03 DIAGNOSIS — M6258 Muscle wasting and atrophy, not elsewhere classified, other site: Secondary | ICD-10-CM | POA: Diagnosis not present

## 2020-08-04 DIAGNOSIS — M6281 Muscle weakness (generalized): Secondary | ICD-10-CM | POA: Diagnosis not present

## 2020-08-04 DIAGNOSIS — R350 Frequency of micturition: Secondary | ICD-10-CM | POA: Diagnosis not present

## 2020-08-04 DIAGNOSIS — M6258 Muscle wasting and atrophy, not elsewhere classified, other site: Secondary | ICD-10-CM | POA: Diagnosis not present

## 2020-08-04 DIAGNOSIS — N390 Urinary tract infection, site not specified: Secondary | ICD-10-CM | POA: Diagnosis not present

## 2020-08-05 DIAGNOSIS — N390 Urinary tract infection, site not specified: Secondary | ICD-10-CM | POA: Diagnosis not present

## 2020-08-05 DIAGNOSIS — M6258 Muscle wasting and atrophy, not elsewhere classified, other site: Secondary | ICD-10-CM | POA: Diagnosis not present

## 2020-08-05 DIAGNOSIS — M6281 Muscle weakness (generalized): Secondary | ICD-10-CM | POA: Diagnosis not present

## 2020-08-06 DIAGNOSIS — R197 Diarrhea, unspecified: Secondary | ICD-10-CM | POA: Diagnosis not present

## 2020-08-06 DIAGNOSIS — I4891 Unspecified atrial fibrillation: Secondary | ICD-10-CM | POA: Diagnosis not present

## 2020-08-06 DIAGNOSIS — I509 Heart failure, unspecified: Secondary | ICD-10-CM | POA: Diagnosis not present

## 2020-08-06 DIAGNOSIS — N390 Urinary tract infection, site not specified: Secondary | ICD-10-CM | POA: Diagnosis not present

## 2020-08-07 DIAGNOSIS — N390 Urinary tract infection, site not specified: Secondary | ICD-10-CM | POA: Diagnosis not present

## 2020-08-07 DIAGNOSIS — M6281 Muscle weakness (generalized): Secondary | ICD-10-CM | POA: Diagnosis not present

## 2020-08-07 DIAGNOSIS — M6258 Muscle wasting and atrophy, not elsewhere classified, other site: Secondary | ICD-10-CM | POA: Diagnosis not present

## 2020-08-08 DIAGNOSIS — I509 Heart failure, unspecified: Secondary | ICD-10-CM | POA: Diagnosis not present

## 2020-08-08 DIAGNOSIS — M6281 Muscle weakness (generalized): Secondary | ICD-10-CM | POA: Diagnosis not present

## 2020-08-08 DIAGNOSIS — M6258 Muscle wasting and atrophy, not elsewhere classified, other site: Secondary | ICD-10-CM | POA: Diagnosis not present

## 2020-08-08 DIAGNOSIS — I4891 Unspecified atrial fibrillation: Secondary | ICD-10-CM | POA: Diagnosis not present

## 2020-08-08 DIAGNOSIS — N139 Obstructive and reflux uropathy, unspecified: Secondary | ICD-10-CM | POA: Diagnosis not present

## 2020-08-08 DIAGNOSIS — N390 Urinary tract infection, site not specified: Secondary | ICD-10-CM | POA: Diagnosis not present

## 2020-08-09 DIAGNOSIS — N390 Urinary tract infection, site not specified: Secondary | ICD-10-CM | POA: Diagnosis not present

## 2020-08-09 DIAGNOSIS — M6281 Muscle weakness (generalized): Secondary | ICD-10-CM | POA: Diagnosis not present

## 2020-08-09 DIAGNOSIS — M6258 Muscle wasting and atrophy, not elsewhere classified, other site: Secondary | ICD-10-CM | POA: Diagnosis not present

## 2020-08-12 DIAGNOSIS — N138 Other obstructive and reflux uropathy: Secondary | ICD-10-CM | POA: Diagnosis not present

## 2020-08-12 DIAGNOSIS — K5730 Diverticulosis of large intestine without perforation or abscess without bleeding: Secondary | ICD-10-CM | POA: Diagnosis not present

## 2020-08-12 DIAGNOSIS — I480 Paroxysmal atrial fibrillation: Secondary | ICD-10-CM | POA: Diagnosis not present

## 2020-08-12 DIAGNOSIS — R5381 Other malaise: Secondary | ICD-10-CM | POA: Diagnosis not present

## 2020-08-12 DIAGNOSIS — R58 Hemorrhage, not elsewhere classified: Secondary | ICD-10-CM | POA: Diagnosis not present

## 2020-08-12 DIAGNOSIS — R319 Hematuria, unspecified: Secondary | ICD-10-CM | POA: Diagnosis not present

## 2020-08-12 DIAGNOSIS — N183 Chronic kidney disease, stage 3 unspecified: Secondary | ICD-10-CM | POA: Diagnosis not present

## 2020-08-12 DIAGNOSIS — T83511A Infection and inflammatory reaction due to indwelling urethral catheter, initial encounter: Secondary | ICD-10-CM | POA: Diagnosis not present

## 2020-08-12 DIAGNOSIS — T83021A Displacement of indwelling urethral catheter, initial encounter: Secondary | ICD-10-CM | POA: Diagnosis not present

## 2020-08-12 DIAGNOSIS — R109 Unspecified abdominal pain: Secondary | ICD-10-CM | POA: Diagnosis not present

## 2020-08-12 DIAGNOSIS — E876 Hypokalemia: Secondary | ICD-10-CM | POA: Diagnosis not present

## 2020-08-12 DIAGNOSIS — R339 Retention of urine, unspecified: Secondary | ICD-10-CM | POA: Diagnosis not present

## 2020-08-12 DIAGNOSIS — E279 Disorder of adrenal gland, unspecified: Secondary | ICD-10-CM | POA: Diagnosis not present

## 2020-08-12 DIAGNOSIS — Z743 Need for continuous supervision: Secondary | ICD-10-CM | POA: Diagnosis not present

## 2020-08-12 DIAGNOSIS — Z8711 Personal history of peptic ulcer disease: Secondary | ICD-10-CM | POA: Diagnosis not present

## 2020-08-12 DIAGNOSIS — K449 Diaphragmatic hernia without obstruction or gangrene: Secondary | ICD-10-CM | POA: Diagnosis not present

## 2020-08-12 DIAGNOSIS — I5032 Chronic diastolic (congestive) heart failure: Secondary | ICD-10-CM | POA: Diagnosis not present

## 2020-08-12 DIAGNOSIS — N390 Urinary tract infection, site not specified: Secondary | ICD-10-CM | POA: Diagnosis not present

## 2020-08-12 DIAGNOSIS — E1122 Type 2 diabetes mellitus with diabetic chronic kidney disease: Secondary | ICD-10-CM | POA: Diagnosis not present

## 2020-08-12 DIAGNOSIS — M4856XA Collapsed vertebra, not elsewhere classified, lumbar region, initial encounter for fracture: Secondary | ICD-10-CM | POA: Diagnosis not present

## 2020-08-12 DIAGNOSIS — L89152 Pressure ulcer of sacral region, stage 2: Secondary | ICD-10-CM | POA: Diagnosis not present

## 2020-08-12 DIAGNOSIS — D72829 Elevated white blood cell count, unspecified: Secondary | ICD-10-CM | POA: Diagnosis not present

## 2020-08-12 DIAGNOSIS — I700 Atherosclerosis of aorta: Secondary | ICD-10-CM | POA: Diagnosis not present

## 2020-08-12 DIAGNOSIS — R310 Gross hematuria: Secondary | ICD-10-CM | POA: Diagnosis not present

## 2020-08-12 DIAGNOSIS — Z7401 Bed confinement status: Secondary | ICD-10-CM | POA: Diagnosis not present

## 2020-08-12 DIAGNOSIS — M2550 Pain in unspecified joint: Secondary | ICD-10-CM | POA: Diagnosis not present

## 2020-08-12 DIAGNOSIS — I130 Hypertensive heart and chronic kidney disease with heart failure and stage 1 through stage 4 chronic kidney disease, or unspecified chronic kidney disease: Secondary | ICD-10-CM | POA: Diagnosis not present

## 2020-08-12 DIAGNOSIS — D62 Acute posthemorrhagic anemia: Secondary | ICD-10-CM | POA: Diagnosis not present

## 2020-08-13 DIAGNOSIS — R109 Unspecified abdominal pain: Secondary | ICD-10-CM | POA: Diagnosis not present

## 2020-08-19 DIAGNOSIS — M6281 Muscle weakness (generalized): Secondary | ICD-10-CM | POA: Diagnosis not present

## 2020-08-20 DIAGNOSIS — I130 Hypertensive heart and chronic kidney disease with heart failure and stage 1 through stage 4 chronic kidney disease, or unspecified chronic kidney disease: Secondary | ICD-10-CM | POA: Diagnosis not present

## 2020-08-20 DIAGNOSIS — M6259 Muscle wasting and atrophy, not elsewhere classified, multiple sites: Secondary | ICD-10-CM | POA: Diagnosis not present

## 2020-08-22 DIAGNOSIS — M6258 Muscle wasting and atrophy, not elsewhere classified, other site: Secondary | ICD-10-CM | POA: Diagnosis not present

## 2020-08-22 DIAGNOSIS — M6281 Muscle weakness (generalized): Secondary | ICD-10-CM | POA: Diagnosis not present

## 2020-08-22 DIAGNOSIS — N390 Urinary tract infection, site not specified: Secondary | ICD-10-CM | POA: Diagnosis not present

## 2020-08-23 DIAGNOSIS — M6281 Muscle weakness (generalized): Secondary | ICD-10-CM | POA: Diagnosis not present

## 2020-08-23 DIAGNOSIS — I509 Heart failure, unspecified: Secondary | ICD-10-CM | POA: Diagnosis not present

## 2020-08-23 DIAGNOSIS — N139 Obstructive and reflux uropathy, unspecified: Secondary | ICD-10-CM | POA: Diagnosis not present

## 2020-08-23 DIAGNOSIS — M6258 Muscle wasting and atrophy, not elsewhere classified, other site: Secondary | ICD-10-CM | POA: Diagnosis not present

## 2020-08-23 DIAGNOSIS — I4891 Unspecified atrial fibrillation: Secondary | ICD-10-CM | POA: Diagnosis not present

## 2020-08-23 DIAGNOSIS — N390 Urinary tract infection, site not specified: Secondary | ICD-10-CM | POA: Diagnosis not present

## 2020-08-24 DIAGNOSIS — M6281 Muscle weakness (generalized): Secondary | ICD-10-CM | POA: Diagnosis not present

## 2020-08-24 DIAGNOSIS — N390 Urinary tract infection, site not specified: Secondary | ICD-10-CM | POA: Diagnosis not present

## 2020-08-24 DIAGNOSIS — M6258 Muscle wasting and atrophy, not elsewhere classified, other site: Secondary | ICD-10-CM | POA: Diagnosis not present

## 2020-08-25 DIAGNOSIS — N390 Urinary tract infection, site not specified: Secondary | ICD-10-CM | POA: Diagnosis not present

## 2020-08-25 DIAGNOSIS — M6258 Muscle wasting and atrophy, not elsewhere classified, other site: Secondary | ICD-10-CM | POA: Diagnosis not present

## 2020-08-25 DIAGNOSIS — M6281 Muscle weakness (generalized): Secondary | ICD-10-CM | POA: Diagnosis not present

## 2020-08-26 DIAGNOSIS — M6258 Muscle wasting and atrophy, not elsewhere classified, other site: Secondary | ICD-10-CM | POA: Diagnosis not present

## 2020-08-26 DIAGNOSIS — N390 Urinary tract infection, site not specified: Secondary | ICD-10-CM | POA: Diagnosis not present

## 2020-08-26 DIAGNOSIS — M6281 Muscle weakness (generalized): Secondary | ICD-10-CM | POA: Diagnosis not present

## 2020-08-28 DIAGNOSIS — M6258 Muscle wasting and atrophy, not elsewhere classified, other site: Secondary | ICD-10-CM | POA: Diagnosis not present

## 2020-08-28 DIAGNOSIS — M6281 Muscle weakness (generalized): Secondary | ICD-10-CM | POA: Diagnosis not present

## 2020-08-28 DIAGNOSIS — N390 Urinary tract infection, site not specified: Secondary | ICD-10-CM | POA: Diagnosis not present

## 2020-08-29 DIAGNOSIS — N390 Urinary tract infection, site not specified: Secondary | ICD-10-CM | POA: Diagnosis not present

## 2020-08-29 DIAGNOSIS — M6281 Muscle weakness (generalized): Secondary | ICD-10-CM | POA: Diagnosis not present

## 2020-08-29 DIAGNOSIS — M6258 Muscle wasting and atrophy, not elsewhere classified, other site: Secondary | ICD-10-CM | POA: Diagnosis not present

## 2020-08-30 DIAGNOSIS — M6281 Muscle weakness (generalized): Secondary | ICD-10-CM | POA: Diagnosis not present

## 2020-08-30 DIAGNOSIS — M6258 Muscle wasting and atrophy, not elsewhere classified, other site: Secondary | ICD-10-CM | POA: Diagnosis not present

## 2020-08-30 DIAGNOSIS — N390 Urinary tract infection, site not specified: Secondary | ICD-10-CM | POA: Diagnosis not present

## 2020-08-31 DIAGNOSIS — N390 Urinary tract infection, site not specified: Secondary | ICD-10-CM | POA: Diagnosis not present

## 2020-08-31 DIAGNOSIS — M6281 Muscle weakness (generalized): Secondary | ICD-10-CM | POA: Diagnosis not present

## 2020-08-31 DIAGNOSIS — M6258 Muscle wasting and atrophy, not elsewhere classified, other site: Secondary | ICD-10-CM | POA: Diagnosis not present

## 2020-09-01 DIAGNOSIS — M6281 Muscle weakness (generalized): Secondary | ICD-10-CM | POA: Diagnosis not present

## 2020-09-01 DIAGNOSIS — M6258 Muscle wasting and atrophy, not elsewhere classified, other site: Secondary | ICD-10-CM | POA: Diagnosis not present

## 2020-09-01 DIAGNOSIS — N390 Urinary tract infection, site not specified: Secondary | ICD-10-CM | POA: Diagnosis not present

## 2020-09-02 DIAGNOSIS — M6281 Muscle weakness (generalized): Secondary | ICD-10-CM | POA: Diagnosis not present

## 2020-09-02 DIAGNOSIS — N390 Urinary tract infection, site not specified: Secondary | ICD-10-CM | POA: Diagnosis not present

## 2020-09-02 DIAGNOSIS — M6258 Muscle wasting and atrophy, not elsewhere classified, other site: Secondary | ICD-10-CM | POA: Diagnosis not present

## 2020-09-03 DIAGNOSIS — I82502 Chronic embolism and thrombosis of unspecified deep veins of left lower extremity: Secondary | ICD-10-CM | POA: Diagnosis not present

## 2020-09-03 DIAGNOSIS — N390 Urinary tract infection, site not specified: Secondary | ICD-10-CM | POA: Diagnosis not present

## 2020-09-03 DIAGNOSIS — M6258 Muscle wasting and atrophy, not elsewhere classified, other site: Secondary | ICD-10-CM | POA: Diagnosis not present

## 2020-09-03 DIAGNOSIS — M6281 Muscle weakness (generalized): Secondary | ICD-10-CM | POA: Diagnosis not present

## 2020-09-03 DIAGNOSIS — G9340 Encephalopathy, unspecified: Secondary | ICD-10-CM | POA: Diagnosis not present

## 2020-09-03 DIAGNOSIS — N184 Chronic kidney disease, stage 4 (severe): Secondary | ICD-10-CM | POA: Diagnosis not present

## 2020-09-05 DIAGNOSIS — M6258 Muscle wasting and atrophy, not elsewhere classified, other site: Secondary | ICD-10-CM | POA: Diagnosis not present

## 2020-09-05 DIAGNOSIS — N390 Urinary tract infection, site not specified: Secondary | ICD-10-CM | POA: Diagnosis not present

## 2020-09-05 DIAGNOSIS — M6281 Muscle weakness (generalized): Secondary | ICD-10-CM | POA: Diagnosis not present

## 2020-09-06 DIAGNOSIS — I130 Hypertensive heart and chronic kidney disease with heart failure and stage 1 through stage 4 chronic kidney disease, or unspecified chronic kidney disease: Secondary | ICD-10-CM | POA: Diagnosis not present

## 2020-09-06 DIAGNOSIS — Z79899 Other long term (current) drug therapy: Secondary | ICD-10-CM | POA: Diagnosis not present

## 2020-09-06 DIAGNOSIS — M6281 Muscle weakness (generalized): Secondary | ICD-10-CM | POA: Diagnosis not present

## 2020-09-06 DIAGNOSIS — M6258 Muscle wasting and atrophy, not elsewhere classified, other site: Secondary | ICD-10-CM | POA: Diagnosis not present

## 2020-09-06 DIAGNOSIS — N390 Urinary tract infection, site not specified: Secondary | ICD-10-CM | POA: Diagnosis not present

## 2020-09-07 DIAGNOSIS — M6258 Muscle wasting and atrophy, not elsewhere classified, other site: Secondary | ICD-10-CM | POA: Diagnosis not present

## 2020-09-07 DIAGNOSIS — I4891 Unspecified atrial fibrillation: Secondary | ICD-10-CM | POA: Diagnosis not present

## 2020-09-07 DIAGNOSIS — N139 Obstructive and reflux uropathy, unspecified: Secondary | ICD-10-CM | POA: Diagnosis not present

## 2020-09-07 DIAGNOSIS — N390 Urinary tract infection, site not specified: Secondary | ICD-10-CM | POA: Diagnosis not present

## 2020-09-07 DIAGNOSIS — M6281 Muscle weakness (generalized): Secondary | ICD-10-CM | POA: Diagnosis not present

## 2020-09-09 DIAGNOSIS — I509 Heart failure, unspecified: Secondary | ICD-10-CM | POA: Diagnosis not present

## 2020-09-09 DIAGNOSIS — N189 Chronic kidney disease, unspecified: Secondary | ICD-10-CM | POA: Diagnosis not present

## 2020-09-09 DIAGNOSIS — N390 Urinary tract infection, site not specified: Secondary | ICD-10-CM | POA: Diagnosis not present

## 2020-09-22 DIAGNOSIS — Z66 Do not resuscitate: Secondary | ICD-10-CM | POA: Diagnosis not present

## 2020-09-22 DIAGNOSIS — N390 Urinary tract infection, site not specified: Secondary | ICD-10-CM | POA: Diagnosis not present

## 2020-09-22 DIAGNOSIS — N2889 Other specified disorders of kidney and ureter: Secondary | ICD-10-CM | POA: Diagnosis not present

## 2020-09-22 DIAGNOSIS — Z79899 Other long term (current) drug therapy: Secondary | ICD-10-CM | POA: Diagnosis not present

## 2020-09-22 DIAGNOSIS — E1122 Type 2 diabetes mellitus with diabetic chronic kidney disease: Secondary | ICD-10-CM | POA: Diagnosis not present

## 2020-09-22 DIAGNOSIS — Z743 Need for continuous supervision: Secondary | ICD-10-CM | POA: Diagnosis not present

## 2020-09-22 DIAGNOSIS — Z794 Long term (current) use of insulin: Secondary | ICD-10-CM | POA: Diagnosis not present

## 2020-09-22 DIAGNOSIS — N179 Acute kidney failure, unspecified: Secondary | ICD-10-CM | POA: Diagnosis not present

## 2020-09-22 DIAGNOSIS — Z87440 Personal history of urinary (tract) infections: Secondary | ICD-10-CM | POA: Diagnosis not present

## 2020-09-22 DIAGNOSIS — I509 Heart failure, unspecified: Secondary | ICD-10-CM | POA: Diagnosis not present

## 2020-09-22 DIAGNOSIS — E119 Type 2 diabetes mellitus without complications: Secondary | ICD-10-CM | POA: Diagnosis not present

## 2020-09-22 DIAGNOSIS — Z959 Presence of cardiac and vascular implant and graft, unspecified: Secondary | ICD-10-CM | POA: Diagnosis not present

## 2020-09-22 DIAGNOSIS — N472 Paraphimosis: Secondary | ICD-10-CM | POA: Diagnosis not present

## 2020-09-22 DIAGNOSIS — R339 Retention of urine, unspecified: Secondary | ICD-10-CM | POA: Diagnosis not present

## 2020-09-22 DIAGNOSIS — R5381 Other malaise: Secondary | ICD-10-CM | POA: Diagnosis not present

## 2020-09-22 DIAGNOSIS — I4891 Unspecified atrial fibrillation: Secondary | ICD-10-CM | POA: Diagnosis not present

## 2020-09-22 DIAGNOSIS — N183 Chronic kidney disease, stage 3 unspecified: Secondary | ICD-10-CM | POA: Diagnosis not present

## 2020-09-23 DIAGNOSIS — E119 Type 2 diabetes mellitus without complications: Secondary | ICD-10-CM | POA: Diagnosis not present

## 2020-09-23 DIAGNOSIS — R197 Diarrhea, unspecified: Secondary | ICD-10-CM | POA: Diagnosis not present

## 2020-09-23 DIAGNOSIS — N183 Chronic kidney disease, stage 3 unspecified: Secondary | ICD-10-CM | POA: Diagnosis not present

## 2020-09-23 DIAGNOSIS — Z743 Need for continuous supervision: Secondary | ICD-10-CM | POA: Diagnosis not present

## 2020-09-23 DIAGNOSIS — M2550 Pain in unspecified joint: Secondary | ICD-10-CM | POA: Diagnosis not present

## 2020-09-23 DIAGNOSIS — N179 Acute kidney failure, unspecified: Secondary | ICD-10-CM | POA: Diagnosis not present

## 2020-09-23 DIAGNOSIS — Z7401 Bed confinement status: Secondary | ICD-10-CM | POA: Diagnosis not present

## 2020-09-26 DIAGNOSIS — E1165 Type 2 diabetes mellitus with hyperglycemia: Secondary | ICD-10-CM | POA: Diagnosis not present

## 2020-09-26 DIAGNOSIS — I509 Heart failure, unspecified: Secondary | ICD-10-CM | POA: Diagnosis not present

## 2020-09-26 DIAGNOSIS — N179 Acute kidney failure, unspecified: Secondary | ICD-10-CM | POA: Diagnosis not present

## 2020-09-26 DIAGNOSIS — I4891 Unspecified atrial fibrillation: Secondary | ICD-10-CM | POA: Diagnosis not present

## 2020-09-26 DIAGNOSIS — N189 Chronic kidney disease, unspecified: Secondary | ICD-10-CM | POA: Diagnosis not present

## 2020-09-27 DIAGNOSIS — R627 Adult failure to thrive: Secondary | ICD-10-CM | POA: Diagnosis not present

## 2020-09-27 DIAGNOSIS — I509 Heart failure, unspecified: Secondary | ICD-10-CM | POA: Diagnosis not present

## 2020-09-27 DIAGNOSIS — N179 Acute kidney failure, unspecified: Secondary | ICD-10-CM | POA: Diagnosis not present

## 2020-09-29 DIAGNOSIS — Z79899 Other long term (current) drug therapy: Secondary | ICD-10-CM | POA: Diagnosis not present

## 2020-10-04 DIAGNOSIS — M6281 Muscle weakness (generalized): Secondary | ICD-10-CM | POA: Diagnosis not present

## 2020-10-04 DIAGNOSIS — N184 Chronic kidney disease, stage 4 (severe): Secondary | ICD-10-CM | POA: Diagnosis not present

## 2020-10-04 DIAGNOSIS — I82502 Chronic embolism and thrombosis of unspecified deep veins of left lower extremity: Secondary | ICD-10-CM | POA: Diagnosis not present

## 2020-10-04 DIAGNOSIS — G9340 Encephalopathy, unspecified: Secondary | ICD-10-CM | POA: Diagnosis not present

## 2020-10-07 DIAGNOSIS — Z79899 Other long term (current) drug therapy: Secondary | ICD-10-CM | POA: Diagnosis not present

## 2020-10-10 DIAGNOSIS — R52 Pain, unspecified: Secondary | ICD-10-CM | POA: Diagnosis not present

## 2020-10-11 DIAGNOSIS — N139 Obstructive and reflux uropathy, unspecified: Secondary | ICD-10-CM | POA: Diagnosis not present

## 2020-10-11 DIAGNOSIS — T83511D Infection and inflammatory reaction due to indwelling urethral catheter, subsequent encounter: Secondary | ICD-10-CM | POA: Diagnosis not present

## 2020-10-11 DIAGNOSIS — R1311 Dysphagia, oral phase: Secondary | ICD-10-CM | POA: Diagnosis not present

## 2020-10-12 DIAGNOSIS — T83511D Infection and inflammatory reaction due to indwelling urethral catheter, subsequent encounter: Secondary | ICD-10-CM | POA: Diagnosis not present

## 2020-10-12 DIAGNOSIS — R1311 Dysphagia, oral phase: Secondary | ICD-10-CM | POA: Diagnosis not present

## 2020-10-12 DIAGNOSIS — N139 Obstructive and reflux uropathy, unspecified: Secondary | ICD-10-CM | POA: Diagnosis not present

## 2020-10-13 DIAGNOSIS — R1311 Dysphagia, oral phase: Secondary | ICD-10-CM | POA: Diagnosis not present

## 2020-10-13 DIAGNOSIS — N139 Obstructive and reflux uropathy, unspecified: Secondary | ICD-10-CM | POA: Diagnosis not present

## 2020-10-13 DIAGNOSIS — T83511D Infection and inflammatory reaction due to indwelling urethral catheter, subsequent encounter: Secondary | ICD-10-CM | POA: Diagnosis not present

## 2020-10-14 DIAGNOSIS — R1311 Dysphagia, oral phase: Secondary | ICD-10-CM | POA: Diagnosis not present

## 2020-10-14 DIAGNOSIS — N139 Obstructive and reflux uropathy, unspecified: Secondary | ICD-10-CM | POA: Diagnosis not present

## 2020-10-14 DIAGNOSIS — T83511D Infection and inflammatory reaction due to indwelling urethral catheter, subsequent encounter: Secondary | ICD-10-CM | POA: Diagnosis not present

## 2020-10-17 DIAGNOSIS — R1311 Dysphagia, oral phase: Secondary | ICD-10-CM | POA: Diagnosis not present

## 2020-10-17 DIAGNOSIS — T83511D Infection and inflammatory reaction due to indwelling urethral catheter, subsequent encounter: Secondary | ICD-10-CM | POA: Diagnosis not present

## 2020-10-17 DIAGNOSIS — N139 Obstructive and reflux uropathy, unspecified: Secondary | ICD-10-CM | POA: Diagnosis not present

## 2020-10-18 DIAGNOSIS — R1311 Dysphagia, oral phase: Secondary | ICD-10-CM | POA: Diagnosis not present

## 2020-10-19 ENCOUNTER — Ambulatory Visit: Payer: Medicare Other | Admitting: Cardiology

## 2020-10-19 DIAGNOSIS — R1311 Dysphagia, oral phase: Secondary | ICD-10-CM | POA: Diagnosis not present

## 2020-10-20 DIAGNOSIS — R1311 Dysphagia, oral phase: Secondary | ICD-10-CM | POA: Diagnosis not present

## 2020-10-21 DIAGNOSIS — R1311 Dysphagia, oral phase: Secondary | ICD-10-CM | POA: Diagnosis not present

## 2020-10-24 DIAGNOSIS — R339 Retention of urine, unspecified: Secondary | ICD-10-CM | POA: Diagnosis not present

## 2020-10-24 DIAGNOSIS — D508 Other iron deficiency anemias: Secondary | ICD-10-CM | POA: Diagnosis not present

## 2020-10-24 DIAGNOSIS — I4891 Unspecified atrial fibrillation: Secondary | ICD-10-CM | POA: Diagnosis not present

## 2020-10-24 DIAGNOSIS — R1311 Dysphagia, oral phase: Secondary | ICD-10-CM | POA: Diagnosis not present

## 2020-10-24 DIAGNOSIS — I509 Heart failure, unspecified: Secondary | ICD-10-CM | POA: Diagnosis not present

## 2020-10-25 DIAGNOSIS — R1311 Dysphagia, oral phase: Secondary | ICD-10-CM | POA: Diagnosis not present

## 2020-10-26 DIAGNOSIS — R1311 Dysphagia, oral phase: Secondary | ICD-10-CM | POA: Diagnosis not present

## 2020-10-27 DIAGNOSIS — I509 Heart failure, unspecified: Secondary | ICD-10-CM | POA: Diagnosis not present

## 2020-10-27 DIAGNOSIS — R1311 Dysphagia, oral phase: Secondary | ICD-10-CM | POA: Diagnosis not present

## 2020-10-27 DIAGNOSIS — D508 Other iron deficiency anemias: Secondary | ICD-10-CM | POA: Diagnosis not present

## 2020-10-27 DIAGNOSIS — I4891 Unspecified atrial fibrillation: Secondary | ICD-10-CM | POA: Diagnosis not present

## 2020-10-27 DIAGNOSIS — N139 Obstructive and reflux uropathy, unspecified: Secondary | ICD-10-CM | POA: Diagnosis not present

## 2020-10-27 DIAGNOSIS — E1165 Type 2 diabetes mellitus with hyperglycemia: Secondary | ICD-10-CM | POA: Diagnosis not present

## 2020-10-28 DIAGNOSIS — R1311 Dysphagia, oral phase: Secondary | ICD-10-CM | POA: Diagnosis not present

## 2020-10-31 DIAGNOSIS — R1311 Dysphagia, oral phase: Secondary | ICD-10-CM | POA: Diagnosis not present

## 2020-11-01 DIAGNOSIS — I82502 Chronic embolism and thrombosis of unspecified deep veins of left lower extremity: Secondary | ICD-10-CM | POA: Diagnosis not present

## 2020-11-01 DIAGNOSIS — M6281 Muscle weakness (generalized): Secondary | ICD-10-CM | POA: Diagnosis not present

## 2020-11-01 DIAGNOSIS — N184 Chronic kidney disease, stage 4 (severe): Secondary | ICD-10-CM | POA: Diagnosis not present

## 2020-11-01 DIAGNOSIS — R1311 Dysphagia, oral phase: Secondary | ICD-10-CM | POA: Diagnosis not present

## 2020-11-01 DIAGNOSIS — G9340 Encephalopathy, unspecified: Secondary | ICD-10-CM | POA: Diagnosis not present

## 2020-11-02 DIAGNOSIS — R1311 Dysphagia, oral phase: Secondary | ICD-10-CM | POA: Diagnosis not present

## 2020-11-03 DIAGNOSIS — R1311 Dysphagia, oral phase: Secondary | ICD-10-CM | POA: Diagnosis not present

## 2020-11-06 DIAGNOSIS — R1311 Dysphagia, oral phase: Secondary | ICD-10-CM | POA: Diagnosis not present

## 2020-11-07 DIAGNOSIS — R1311 Dysphagia, oral phase: Secondary | ICD-10-CM | POA: Diagnosis not present

## 2020-11-08 DIAGNOSIS — R1311 Dysphagia, oral phase: Secondary | ICD-10-CM | POA: Diagnosis not present

## 2020-11-09 DIAGNOSIS — R1311 Dysphagia, oral phase: Secondary | ICD-10-CM | POA: Diagnosis not present

## 2020-11-14 DIAGNOSIS — M2041 Other hammer toe(s) (acquired), right foot: Secondary | ICD-10-CM | POA: Diagnosis not present

## 2020-11-14 DIAGNOSIS — B351 Tinea unguium: Secondary | ICD-10-CM | POA: Diagnosis not present

## 2020-11-14 DIAGNOSIS — E1159 Type 2 diabetes mellitus with other circulatory complications: Secondary | ICD-10-CM | POA: Diagnosis not present

## 2020-11-14 DIAGNOSIS — M2042 Other hammer toe(s) (acquired), left foot: Secondary | ICD-10-CM | POA: Diagnosis not present

## 2020-11-18 ENCOUNTER — Other Ambulatory Visit: Payer: Self-pay | Admitting: *Deleted

## 2020-11-18 NOTE — Patient Outreach (Signed)
[facility]) Care Management  11/18/2020  Livingston Diones January 20, 1941 [id number]   [employer] Referral for DM, HF, HTN. Initial call was unsuccessful but was able to leave a message and and request a return call. I will also try to call again next week.  Eulah Pont. Myrtie Neither, MSN, [facility] Gerontological Nurse Practitioner [facility] Care Management [phone]

## 2020-11-22 ENCOUNTER — Other Ambulatory Visit: Payer: Self-pay | Admitting: *Deleted

## 2020-11-22 DIAGNOSIS — N139 Obstructive and reflux uropathy, unspecified: Secondary | ICD-10-CM | POA: Diagnosis not present

## 2020-11-22 NOTE — Patient Outreach (Signed)
[facility]) Care Management  11/22/2020  Erik Morales [birth year] [id number]  Reviewed pt referral information from [facility]. Found out pt is in [employer] residency at [facility] and is not eligible for care management services.  Eulah Pont. Myrtie Neither, MSN, [facility] Gerontological Nurse Practitioner [facility] Care Management [phone]

## 2020-11-23 ENCOUNTER — Other Ambulatory Visit: Payer: Self-pay | Admitting: *Deleted

## 2020-11-23 NOTE — Patient Outreach (Signed)
[facility]) Care Management  11/23/2020  FABIOLA MCKETHAN June 10, 1941 [id number]   [facility] Telephone Assessment/Screen for post hospital/snf/complex care referral  Referral Date: 11/07/20 Referral Source: From Ping: Discharged from [facility] observation to [facility] on 09/23/20 at 10:30 PM after 1 days of stay Referral Reason: complex care referral post snf DX DM, CHF, HTN Insurance: [employer] medicare     Outreach attempt # 1 Unsuccessful outreaches to [phone] which are invalid numbers Reach [name] at [phone] who states pt remains at snf and not sure if patient will be discharged   Plan:  Case closure not eligible for [facility] services in skilled nursing facility (snf)   Louann Hopson L. Lavina Hamman, RN, BSN, [place] Coordinator Office number [phone] Mobile number [phone]  Main [facility] number [phone] Fax number [phone]

## 2020-11-24 DIAGNOSIS — H903 Sensorineural hearing loss, bilateral: Secondary | ICD-10-CM | POA: Diagnosis not present

## 2020-11-28 DIAGNOSIS — E1165 Type 2 diabetes mellitus with hyperglycemia: Secondary | ICD-10-CM | POA: Diagnosis not present

## 2020-11-28 DIAGNOSIS — D508 Other iron deficiency anemias: Secondary | ICD-10-CM | POA: Diagnosis not present

## 2020-11-28 DIAGNOSIS — R339 Retention of urine, unspecified: Secondary | ICD-10-CM | POA: Diagnosis not present

## 2020-11-28 DIAGNOSIS — I4891 Unspecified atrial fibrillation: Secondary | ICD-10-CM | POA: Diagnosis not present

## 2020-11-30 DIAGNOSIS — I4891 Unspecified atrial fibrillation: Secondary | ICD-10-CM | POA: Diagnosis not present

## 2020-11-30 DIAGNOSIS — D508 Other iron deficiency anemias: Secondary | ICD-10-CM | POA: Diagnosis not present

## 2020-11-30 DIAGNOSIS — N189 Chronic kidney disease, unspecified: Secondary | ICD-10-CM | POA: Diagnosis not present

## 2020-11-30 DIAGNOSIS — I509 Heart failure, unspecified: Secondary | ICD-10-CM | POA: Diagnosis not present

## 2020-12-02 DIAGNOSIS — M6281 Muscle weakness (generalized): Secondary | ICD-10-CM | POA: Diagnosis not present

## 2020-12-02 DIAGNOSIS — N184 Chronic kidney disease, stage 4 (severe): Secondary | ICD-10-CM | POA: Diagnosis not present

## 2020-12-02 DIAGNOSIS — I82502 Chronic embolism and thrombosis of unspecified deep veins of left lower extremity: Secondary | ICD-10-CM | POA: Diagnosis not present

## 2020-12-02 DIAGNOSIS — G9340 Encephalopathy, unspecified: Secondary | ICD-10-CM | POA: Diagnosis not present

## 2020-12-13 DIAGNOSIS — H353131 Nonexudative age-related macular degeneration, bilateral, early dry stage: Secondary | ICD-10-CM | POA: Diagnosis not present

## 2020-12-13 DIAGNOSIS — H47013 Ischemic optic neuropathy, bilateral: Secondary | ICD-10-CM | POA: Diagnosis not present

## 2020-12-13 DIAGNOSIS — E1165 Type 2 diabetes mellitus with hyperglycemia: Secondary | ICD-10-CM | POA: Diagnosis not present

## 2020-12-13 DIAGNOSIS — H524 Presbyopia: Secondary | ICD-10-CM | POA: Diagnosis not present

## 2020-12-20 DIAGNOSIS — N139 Obstructive and reflux uropathy, unspecified: Secondary | ICD-10-CM | POA: Diagnosis not present

## 2020-12-22 DIAGNOSIS — I130 Hypertensive heart and chronic kidney disease with heart failure and stage 1 through stage 4 chronic kidney disease, or unspecified chronic kidney disease: Secondary | ICD-10-CM | POA: Diagnosis not present

## 2020-12-23 DIAGNOSIS — E860 Dehydration: Secondary | ICD-10-CM | POA: Diagnosis not present

## 2020-12-23 DIAGNOSIS — N189 Chronic kidney disease, unspecified: Secondary | ICD-10-CM | POA: Diagnosis not present

## 2020-12-23 DIAGNOSIS — N179 Acute kidney failure, unspecified: Secondary | ICD-10-CM | POA: Diagnosis not present

## 2020-12-23 DIAGNOSIS — I509 Heart failure, unspecified: Secondary | ICD-10-CM | POA: Diagnosis not present

## 2020-12-24 DIAGNOSIS — E860 Dehydration: Secondary | ICD-10-CM | POA: Diagnosis not present

## 2020-12-25 IMAGING — US US RENAL
1 series · 14 of 25 positions shown · non-contrast
Comparison: 09/26/2018

CLINICAL DATA: acute renal failure.

EXAM:
RENAL / URINARY TRACT ULTRASOUND COMPLETE

[Series 1: us renal · 14 of 35 slices shown]
[im 1/35]
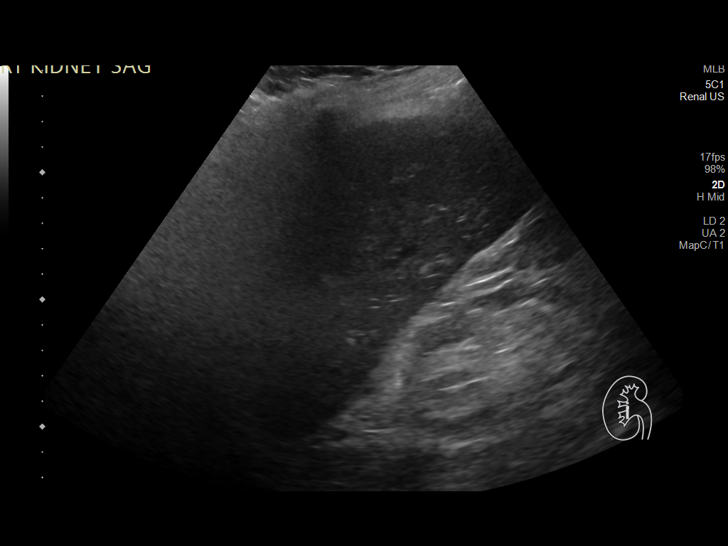
[im 3/35]
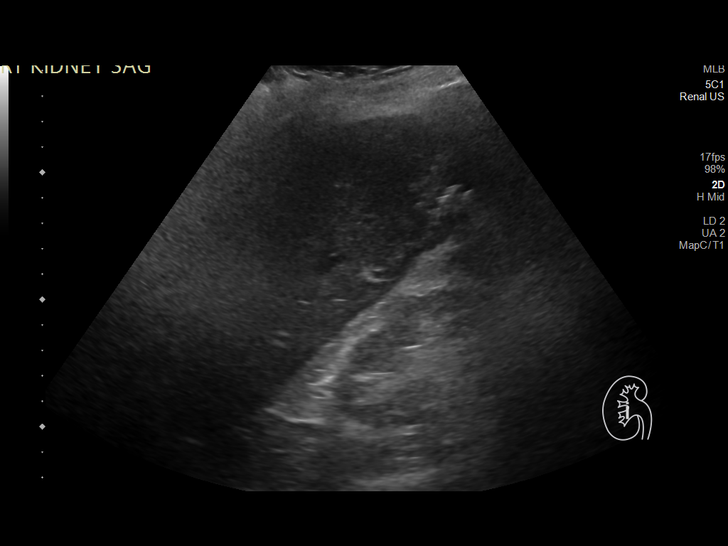
[im 6/35]
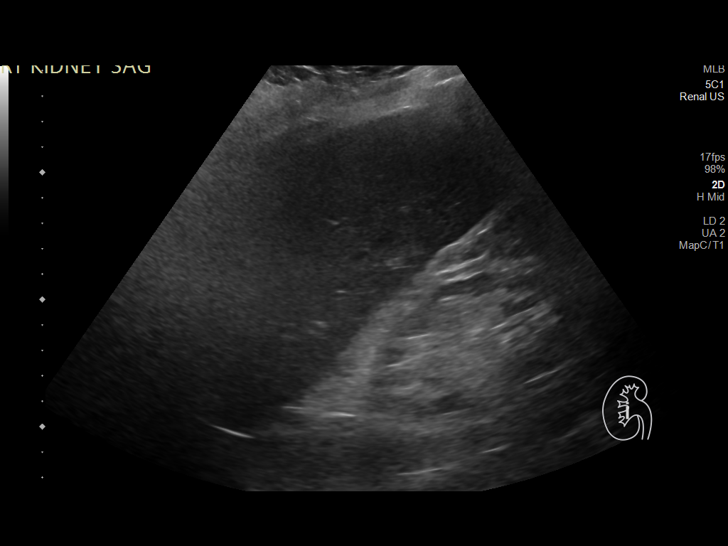
[im 9/35]
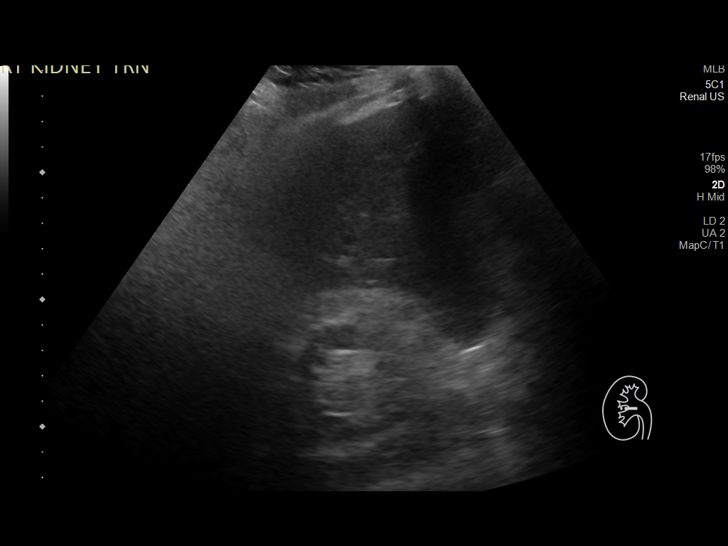
[im 12/35]
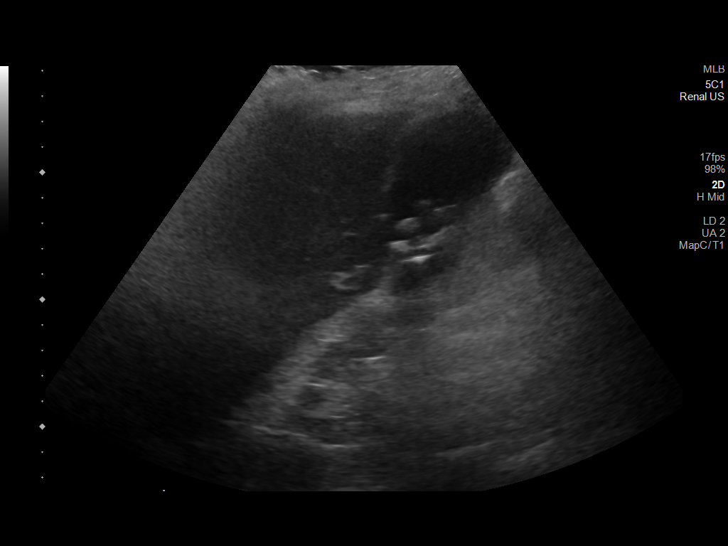
[im 13/35]
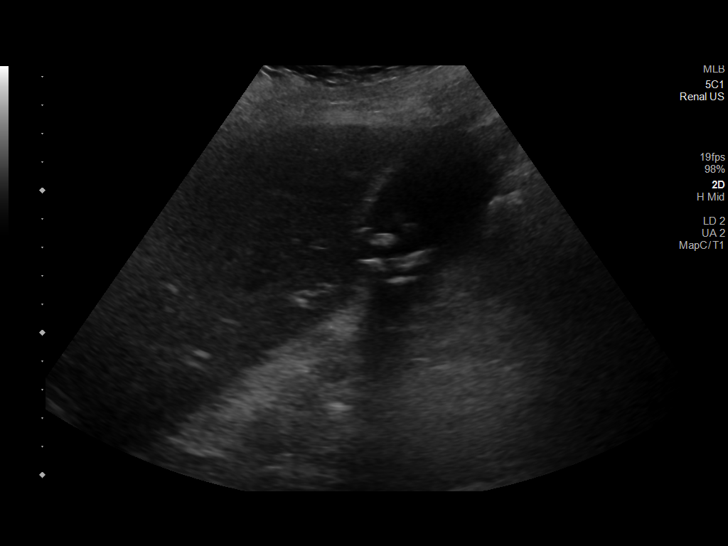
[im 16/35]
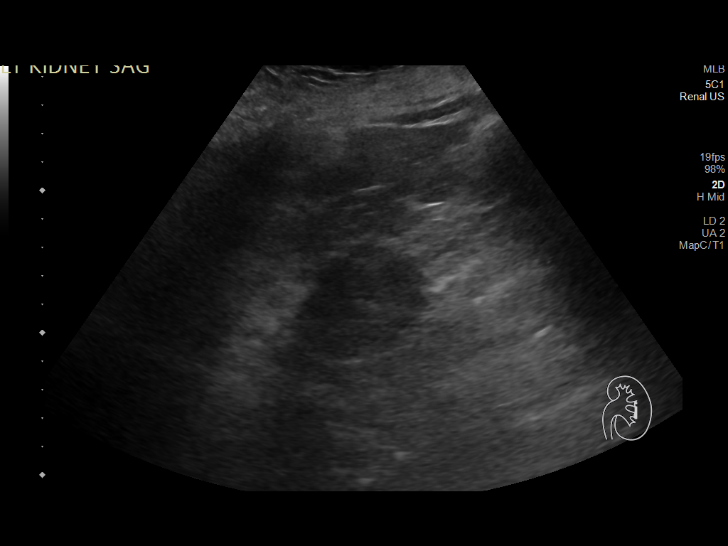
[im 19/35]
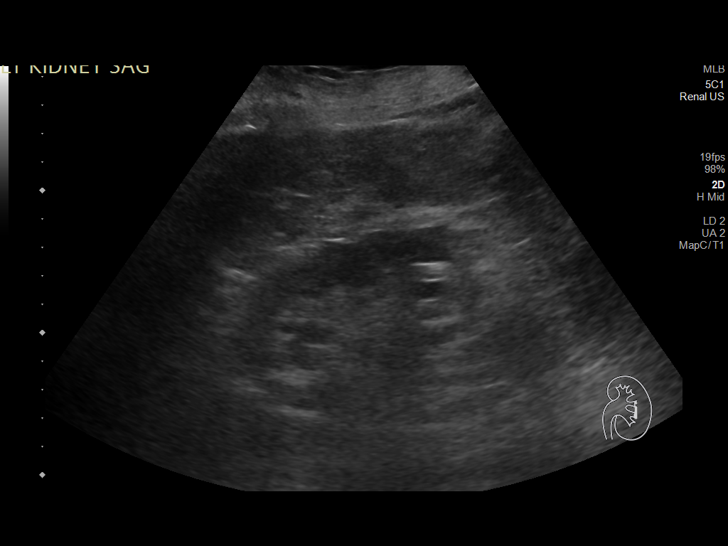
[im 22/35]
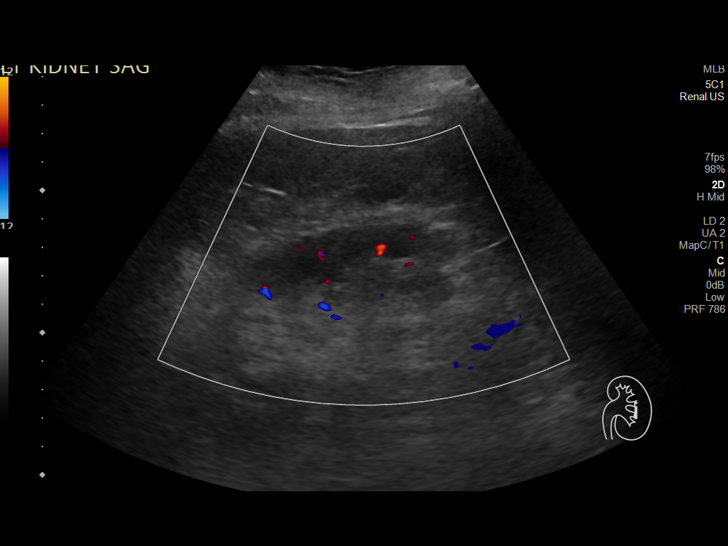
[im 23/35]
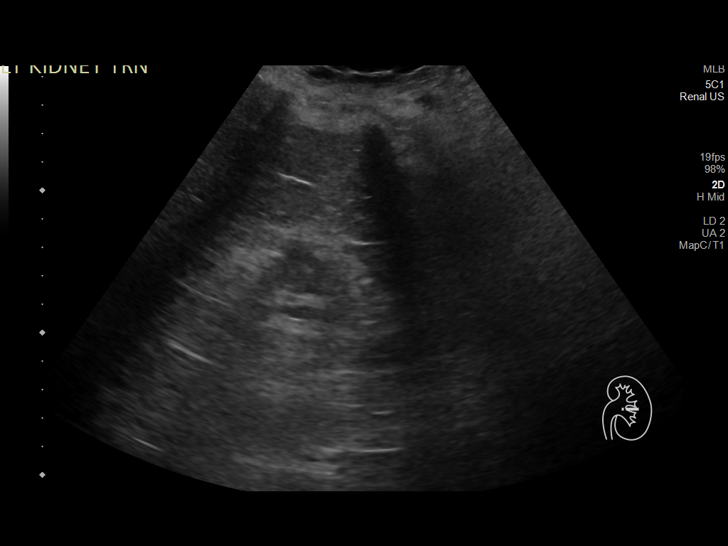
[im 26/35]
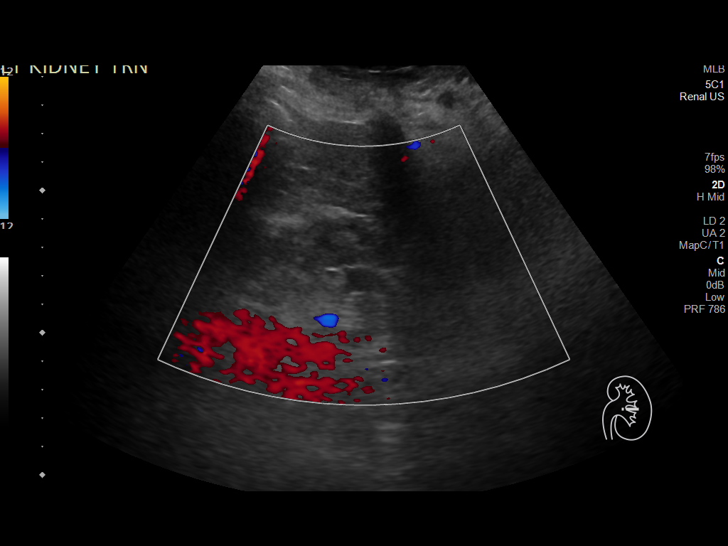
[im 29/35]
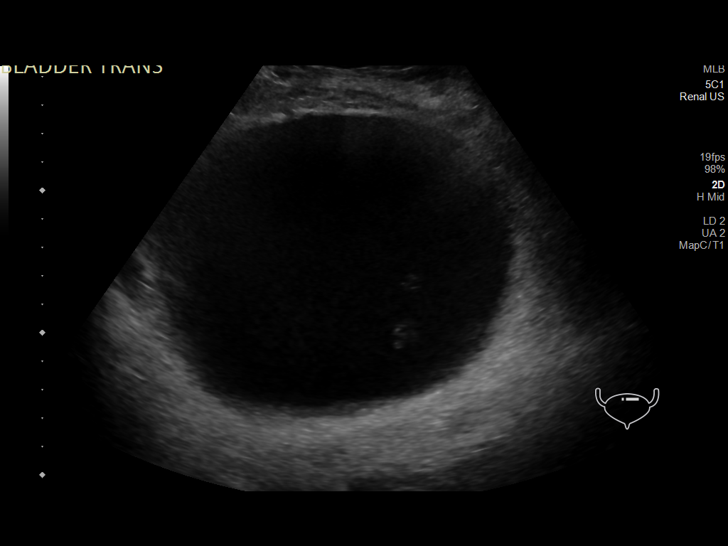
[im 32/35]
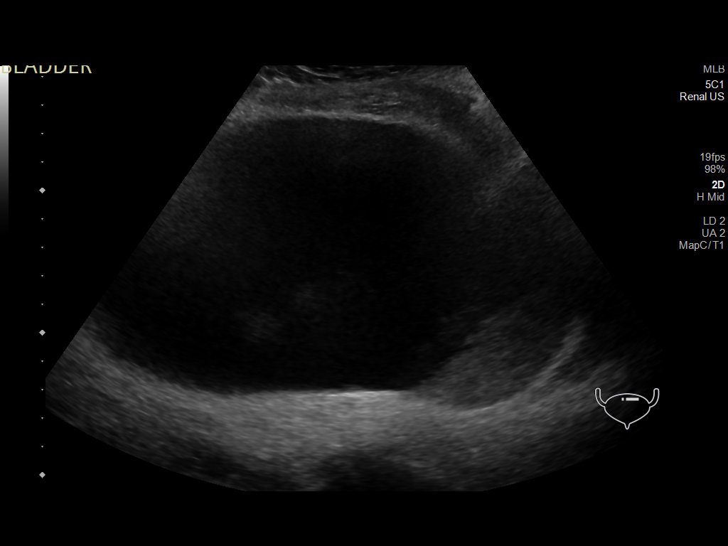
[im 35/35]
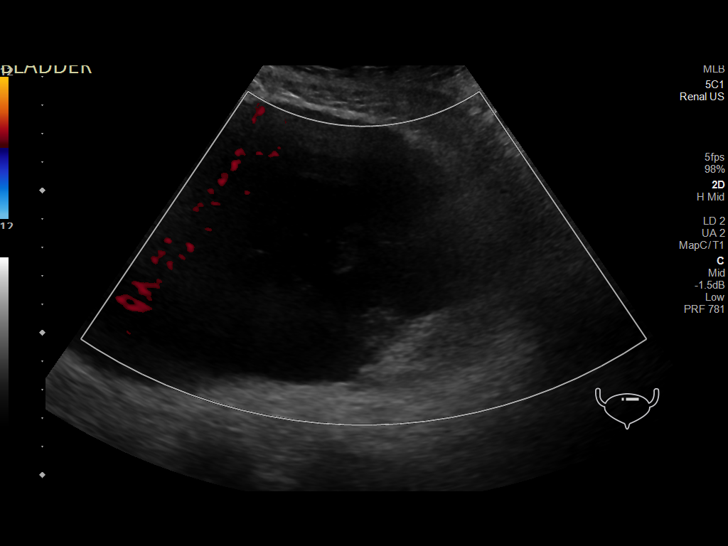

[14 of 25 positions shown; findings below may reference images not displayed]

FINDINGS: Right Kidney:

Renal measurements: 9.2 x5.0 x 4.8 cm = volume: 114.5 mL. Diffuse
cortical thinning. Echogenicity within normal limits. No mass or
hydronephrosis visualized.

Left Kidney:

Renal measurements: 8.9 x 5.1 by 4.7 cm = volume: 112.6 mL. Diffuse
cortical thinning. Echogenicity within normal limits. No mass or
hydronephrosis visualized.

Bladder:

There is a focal area of asymmetric wall thickening involving the
right posterior bladder base. This measures 3.2 cm. On the color
Doppler images there is only mild peripheral blood flow noted
associated with this area.

Other:

None.
IMPRESSION: 1. No hydronephrosis identified.
2. Bilateral renal cortical thinning.
3. Focal asymmetric wall thickening involving the right posterior
bladder base. Cannot rule out bladder neoplasm. Recommend further
evaluation with hematuria protocol CT of the abdomen and pelvis
without and with contrast material versus direct visualization with
cystoscopy.

## 2020-12-25 IMAGING — CT CT ABD-PELV W/O CM
2 of 4 series · 16 of 46 positions shown, 18 images · non-contrast
Comparison: None.

CLINICAL DATA: Hematuria of unknown cause.  Acute renal failure.

EXAM:
CT ABDOMEN AND PELVIS WITHOUT CONTRAST
TECHNIQUE: Multidetector CT imaging of the abdomen and pelvis was performed
following the standard protocol without IV contrast.

[Series 3: a/p w/o 5mm · axial · non-contrast · 0.96mm/px · z∈[-621,-191]mm · 13 of 98 slices shown, 15 images]
[im 8/98  soft-tissue]
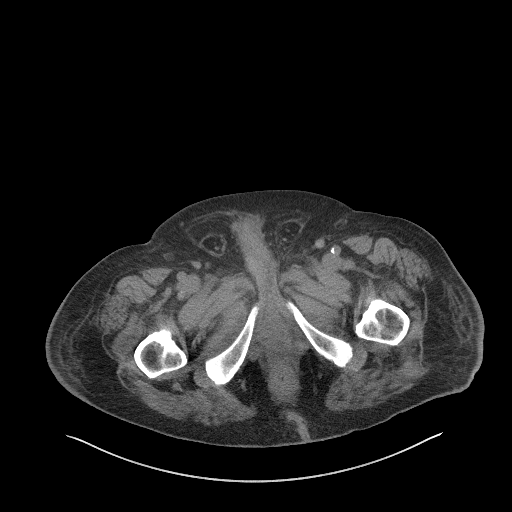
[im 8/98  bone]
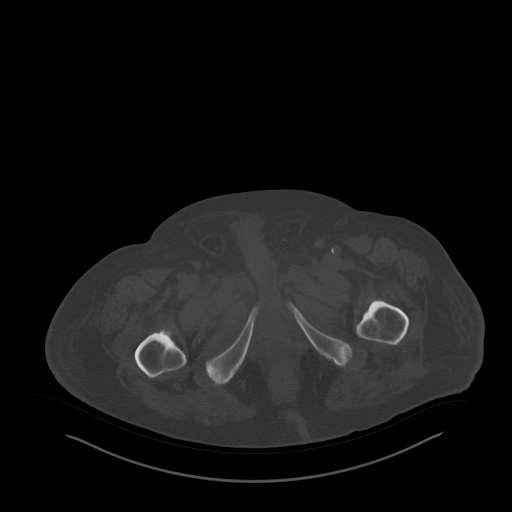
[im 15/98  soft-tissue]
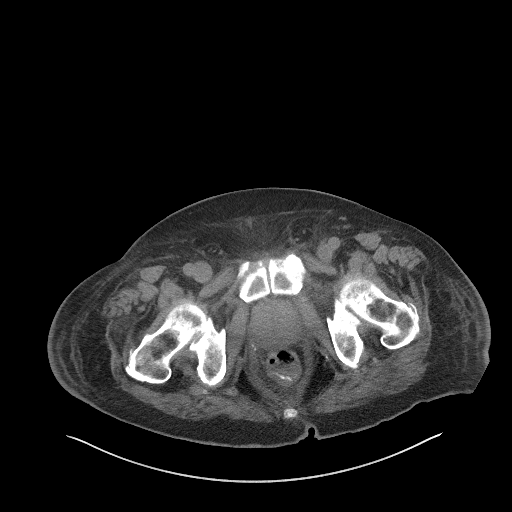
[im 22/98  soft-tissue]
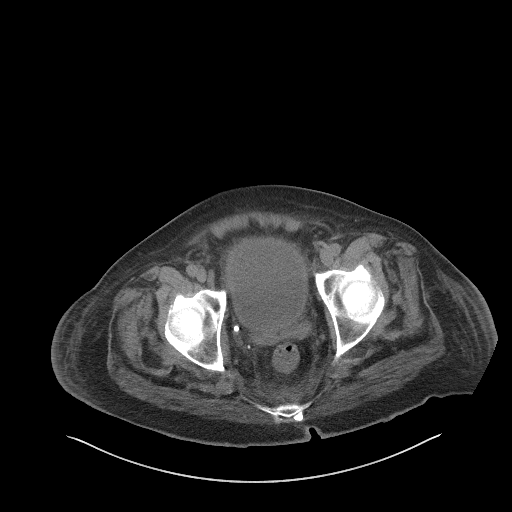
[im 29/98  soft-tissue]
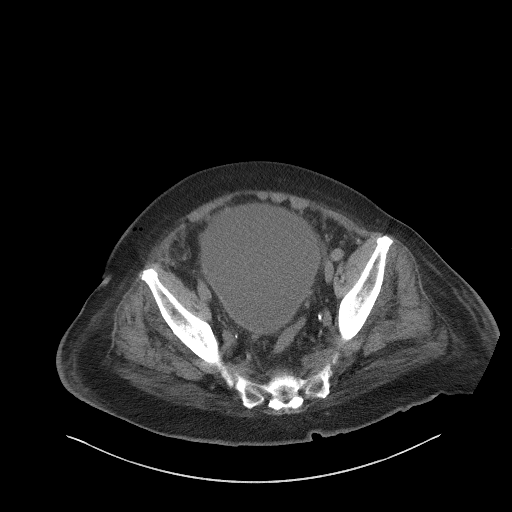
[im 36/98  soft-tissue]
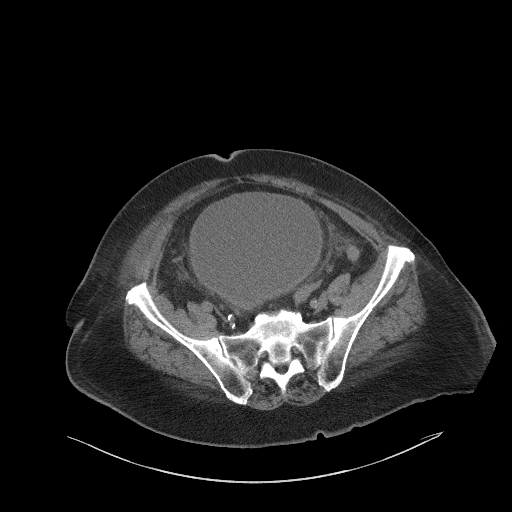
[im 44/98  soft-tissue]
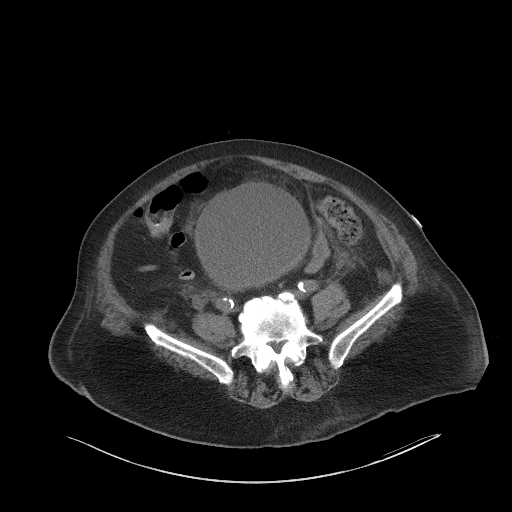
[im 51/98  soft-tissue]
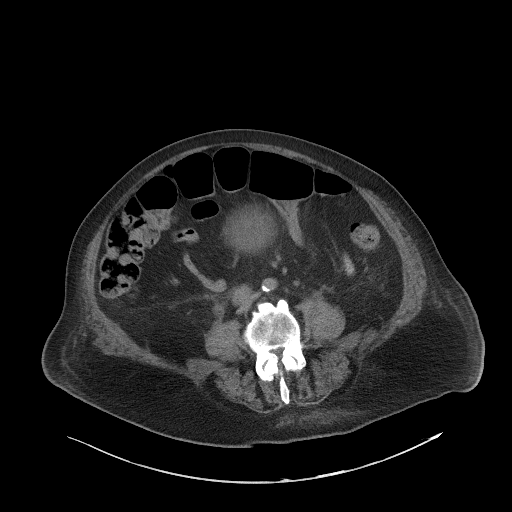
[im 58/98  soft-tissue]
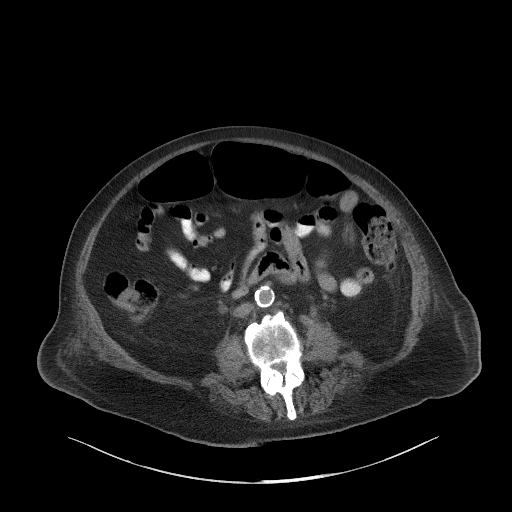
[im 65/98  soft-tissue]
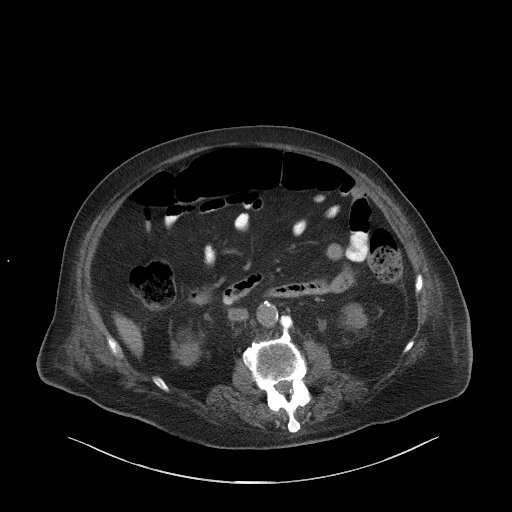
[im 65/98  bone]
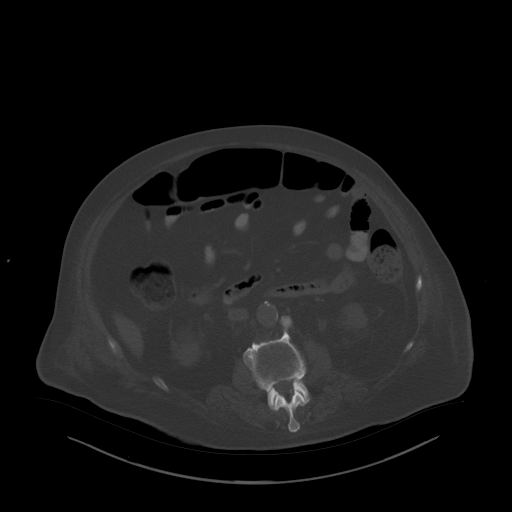
[im 72/98  soft-tissue]
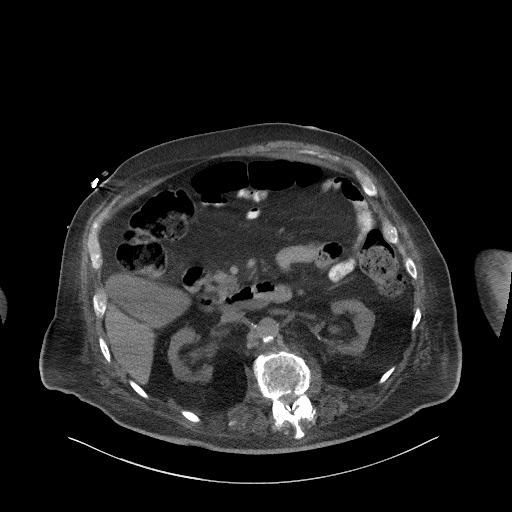
[im 80/98  soft-tissue]
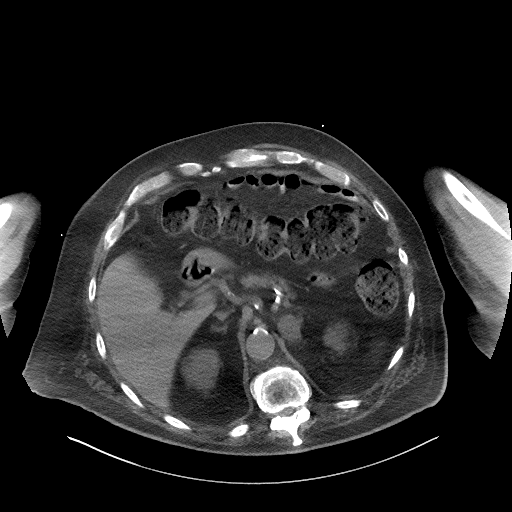
[im 87/98  soft-tissue]
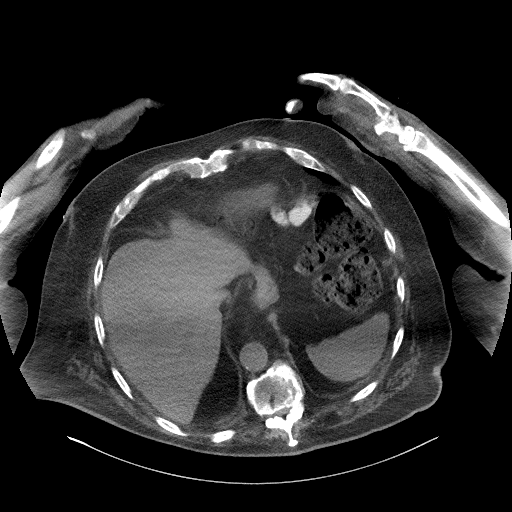
[im 94/98  soft-tissue]
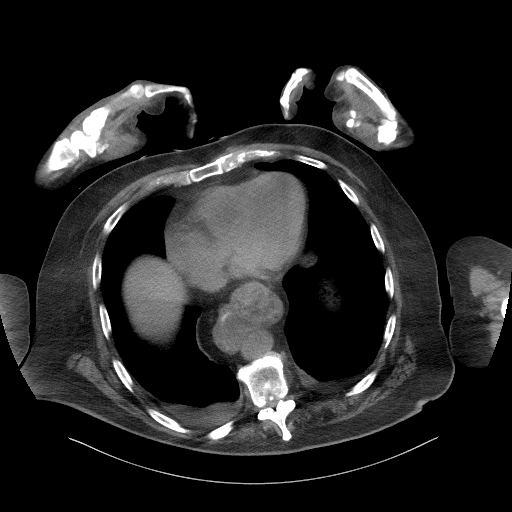

[Series 6: a/p w/o cor · coronal · non-contrast · 0.98mm/px · 3 of 183 slices shown]
[im 61/183  soft-tissue]
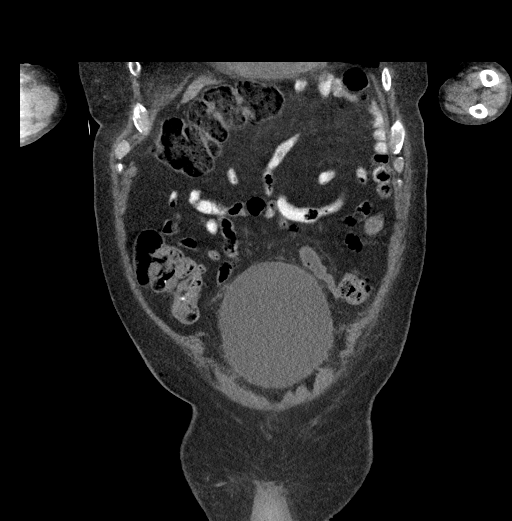
[im 81/183  soft-tissue]
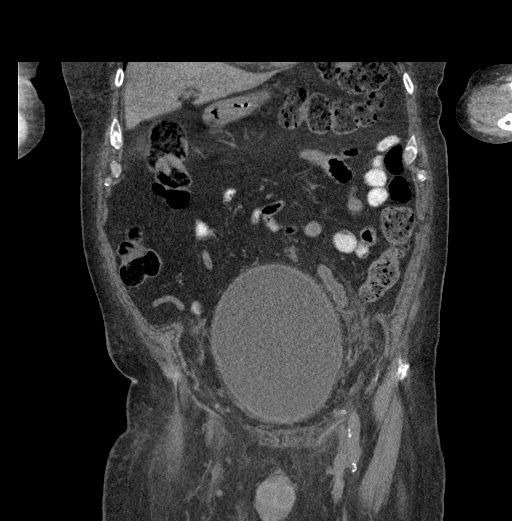
[im 102/183  soft-tissue]
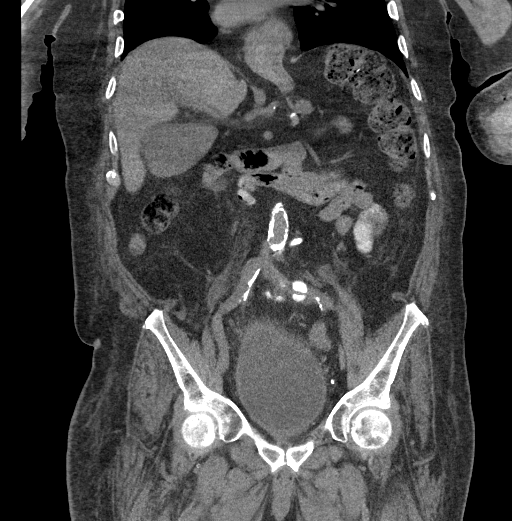

[16 of 46 positions shown; findings below may reference images not displayed]

FINDINGS: Lower chest: Small bilateral pleural effusions. Moderate-sized
esophageal hiatal hernia.

Hepatobiliary: Homogeneous appearance of the liver. The gallbladder
is distended without stone or wall thickening. No bile duct
dilatation.

Pancreas: Unremarkable. No pancreatic ductal dilatation or
surrounding inflammatory changes.

Spleen: Normal in size without focal abnormality.

Adrenals/Urinary Tract: Left adrenal gland nodule measuring 2.2 cm
diameter. Density measurements are -5 Hounsfield units consistent
with a benign fat containing adenoma. Bilateral renal parenchymal
thinning. Mild hydronephrosis and hydroureter bilaterally. No
obstructing stones are demonstrated. There is infiltration in the
retroperitoneal and pelvic fat with mild medial deviation of the
ureters. This may indicate retroperitoneal fibrosis. Alternatively,
the ureters and collecting systems could be dilated due to reflux
from the bladder which is prominently distended. No stone or filling
defect in the bladder. No significant bladder wall thickening.

Stomach/Bowel: Stomach and small bowel are decompressed.
Stool-filled colon without abnormal distention. The appendix is
normal.

Vascular/Lymphatic: Aortic atherosclerosis. No enlarged abdominal or
pelvic lymph nodes.

Reproductive: Prostate gland is enlarged, measuring 5.1 cm diameter.

Other: No free air or free fluid in the abdomen. Edema in the
subcutaneous fat. Abdominal wall musculature appears intact.

Musculoskeletal: Degenerative changes in the spine. No destructive
bone lesions.
IMPRESSION: 1. Small bilateral pleural effusions.
2. Moderate-sized esophageal hiatal hernia.
3. Left adrenal gland adenoma.
4. Mild hydronephrosis and hydroureter bilaterally. No obstructing
stones are demonstrated. There is infiltration in the
retroperitoneal and pelvic fat with mild medial deviation of the
ureters. This may indicate retroperitoneal fibrosis. Alternatively,
the ureters and collecting systems could be dilated due to reflux
from the bladder which is prominently distended.
5. Enlarged prostate gland.
6. Aortic atherosclerosis.
7. Edema in the subcutaneous fat.

Aortic Atherosclerosis ([id number]).

## 2020-12-26 DIAGNOSIS — I480 Paroxysmal atrial fibrillation: Secondary | ICD-10-CM | POA: Diagnosis not present

## 2020-12-26 DIAGNOSIS — R339 Retention of urine, unspecified: Secondary | ICD-10-CM | POA: Diagnosis not present

## 2020-12-26 DIAGNOSIS — I509 Heart failure, unspecified: Secondary | ICD-10-CM | POA: Diagnosis not present

## 2020-12-26 DIAGNOSIS — E860 Dehydration: Secondary | ICD-10-CM | POA: Diagnosis not present

## 2020-12-26 DIAGNOSIS — N179 Acute kidney failure, unspecified: Secondary | ICD-10-CM | POA: Diagnosis not present

## 2020-12-29 DIAGNOSIS — E860 Dehydration: Secondary | ICD-10-CM | POA: Diagnosis not present

## 2020-12-29 DIAGNOSIS — N179 Acute kidney failure, unspecified: Secondary | ICD-10-CM | POA: Diagnosis not present

## 2021-01-02 DIAGNOSIS — R339 Retention of urine, unspecified: Secondary | ICD-10-CM | POA: Diagnosis not present

## 2021-01-02 DIAGNOSIS — N179 Acute kidney failure, unspecified: Secondary | ICD-10-CM | POA: Diagnosis not present

## 2021-01-02 DIAGNOSIS — N189 Chronic kidney disease, unspecified: Secondary | ICD-10-CM | POA: Diagnosis not present

## 2021-01-02 DIAGNOSIS — N390 Urinary tract infection, site not specified: Secondary | ICD-10-CM | POA: Diagnosis not present

## 2021-01-02 DIAGNOSIS — D508 Other iron deficiency anemias: Secondary | ICD-10-CM | POA: Diagnosis not present

## 2021-01-02 DIAGNOSIS — I4891 Unspecified atrial fibrillation: Secondary | ICD-10-CM | POA: Diagnosis not present

## 2021-01-02 DIAGNOSIS — I509 Heart failure, unspecified: Secondary | ICD-10-CM | POA: Diagnosis not present

## 2021-01-03 DIAGNOSIS — M6259 Muscle wasting and atrophy, not elsewhere classified, multiple sites: Secondary | ICD-10-CM | POA: Diagnosis not present

## 2021-01-03 DIAGNOSIS — N139 Obstructive and reflux uropathy, unspecified: Secondary | ICD-10-CM | POA: Diagnosis not present

## 2021-01-03 DIAGNOSIS — M6281 Muscle weakness (generalized): Secondary | ICD-10-CM | POA: Diagnosis not present

## 2021-01-03 DIAGNOSIS — T83511D Infection and inflammatory reaction due to indwelling urethral catheter, subsequent encounter: Secondary | ICD-10-CM | POA: Diagnosis not present

## 2021-01-03 DIAGNOSIS — R293 Abnormal posture: Secondary | ICD-10-CM | POA: Diagnosis not present

## 2021-01-04 DIAGNOSIS — R293 Abnormal posture: Secondary | ICD-10-CM | POA: Diagnosis not present

## 2021-01-04 DIAGNOSIS — M6281 Muscle weakness (generalized): Secondary | ICD-10-CM | POA: Diagnosis not present

## 2021-01-04 DIAGNOSIS — N139 Obstructive and reflux uropathy, unspecified: Secondary | ICD-10-CM | POA: Diagnosis not present

## 2021-01-04 DIAGNOSIS — M6259 Muscle wasting and atrophy, not elsewhere classified, multiple sites: Secondary | ICD-10-CM | POA: Diagnosis not present

## 2021-01-04 DIAGNOSIS — T83511D Infection and inflammatory reaction due to indwelling urethral catheter, subsequent encounter: Secondary | ICD-10-CM | POA: Diagnosis not present

## 2021-01-05 DIAGNOSIS — M6281 Muscle weakness (generalized): Secondary | ICD-10-CM | POA: Diagnosis not present

## 2021-01-05 DIAGNOSIS — R944 Abnormal results of kidney function studies: Secondary | ICD-10-CM | POA: Diagnosis not present

## 2021-01-05 DIAGNOSIS — R293 Abnormal posture: Secondary | ICD-10-CM | POA: Diagnosis not present

## 2021-01-05 DIAGNOSIS — N139 Obstructive and reflux uropathy, unspecified: Secondary | ICD-10-CM | POA: Diagnosis not present

## 2021-01-05 DIAGNOSIS — E1022 Type 1 diabetes mellitus with diabetic chronic kidney disease: Secondary | ICD-10-CM | POA: Diagnosis not present

## 2021-01-05 DIAGNOSIS — T83511D Infection and inflammatory reaction due to indwelling urethral catheter, subsequent encounter: Secondary | ICD-10-CM | POA: Diagnosis not present

## 2021-01-05 DIAGNOSIS — M6259 Muscle wasting and atrophy, not elsewhere classified, multiple sites: Secondary | ICD-10-CM | POA: Diagnosis not present

## 2021-01-09 DIAGNOSIS — M6281 Muscle weakness (generalized): Secondary | ICD-10-CM | POA: Diagnosis not present

## 2021-01-09 DIAGNOSIS — M6259 Muscle wasting and atrophy, not elsewhere classified, multiple sites: Secondary | ICD-10-CM | POA: Diagnosis not present

## 2021-01-09 DIAGNOSIS — N139 Obstructive and reflux uropathy, unspecified: Secondary | ICD-10-CM | POA: Diagnosis not present

## 2021-01-09 DIAGNOSIS — T83511D Infection and inflammatory reaction due to indwelling urethral catheter, subsequent encounter: Secondary | ICD-10-CM | POA: Diagnosis not present

## 2021-01-09 DIAGNOSIS — R293 Abnormal posture: Secondary | ICD-10-CM | POA: Diagnosis not present

## 2021-01-10 DIAGNOSIS — T83511D Infection and inflammatory reaction due to indwelling urethral catheter, subsequent encounter: Secondary | ICD-10-CM | POA: Diagnosis not present

## 2021-01-10 DIAGNOSIS — M6281 Muscle weakness (generalized): Secondary | ICD-10-CM | POA: Diagnosis not present

## 2021-01-10 DIAGNOSIS — M6259 Muscle wasting and atrophy, not elsewhere classified, multiple sites: Secondary | ICD-10-CM | POA: Diagnosis not present

## 2021-01-10 DIAGNOSIS — R293 Abnormal posture: Secondary | ICD-10-CM | POA: Diagnosis not present

## 2021-01-10 DIAGNOSIS — N139 Obstructive and reflux uropathy, unspecified: Secondary | ICD-10-CM | POA: Diagnosis not present

## 2021-01-11 DIAGNOSIS — R293 Abnormal posture: Secondary | ICD-10-CM | POA: Diagnosis not present

## 2021-01-11 DIAGNOSIS — N139 Obstructive and reflux uropathy, unspecified: Secondary | ICD-10-CM | POA: Diagnosis not present

## 2021-01-11 DIAGNOSIS — M6281 Muscle weakness (generalized): Secondary | ICD-10-CM | POA: Diagnosis not present

## 2021-01-11 DIAGNOSIS — T83511D Infection and inflammatory reaction due to indwelling urethral catheter, subsequent encounter: Secondary | ICD-10-CM | POA: Diagnosis not present

## 2021-01-11 DIAGNOSIS — M6259 Muscle wasting and atrophy, not elsewhere classified, multiple sites: Secondary | ICD-10-CM | POA: Diagnosis not present

## 2021-01-12 DIAGNOSIS — N139 Obstructive and reflux uropathy, unspecified: Secondary | ICD-10-CM | POA: Diagnosis not present

## 2021-01-12 DIAGNOSIS — R293 Abnormal posture: Secondary | ICD-10-CM | POA: Diagnosis not present

## 2021-01-12 DIAGNOSIS — M6281 Muscle weakness (generalized): Secondary | ICD-10-CM | POA: Diagnosis not present

## 2021-01-12 DIAGNOSIS — T83511D Infection and inflammatory reaction due to indwelling urethral catheter, subsequent encounter: Secondary | ICD-10-CM | POA: Diagnosis not present

## 2021-01-12 DIAGNOSIS — M6259 Muscle wasting and atrophy, not elsewhere classified, multiple sites: Secondary | ICD-10-CM | POA: Diagnosis not present

## 2021-01-13 DIAGNOSIS — T83511D Infection and inflammatory reaction due to indwelling urethral catheter, subsequent encounter: Secondary | ICD-10-CM | POA: Diagnosis not present

## 2021-01-13 DIAGNOSIS — R339 Retention of urine, unspecified: Secondary | ICD-10-CM | POA: Diagnosis not present

## 2021-01-13 DIAGNOSIS — M6259 Muscle wasting and atrophy, not elsewhere classified, multiple sites: Secondary | ICD-10-CM | POA: Diagnosis not present

## 2021-01-13 DIAGNOSIS — M6281 Muscle weakness (generalized): Secondary | ICD-10-CM | POA: Diagnosis not present

## 2021-01-13 DIAGNOSIS — R293 Abnormal posture: Secondary | ICD-10-CM | POA: Diagnosis not present

## 2021-01-13 DIAGNOSIS — N139 Obstructive and reflux uropathy, unspecified: Secondary | ICD-10-CM | POA: Diagnosis not present

## 2021-01-15 DIAGNOSIS — M6259 Muscle wasting and atrophy, not elsewhere classified, multiple sites: Secondary | ICD-10-CM | POA: Diagnosis not present

## 2021-01-15 DIAGNOSIS — N139 Obstructive and reflux uropathy, unspecified: Secondary | ICD-10-CM | POA: Diagnosis not present

## 2021-01-15 DIAGNOSIS — R293 Abnormal posture: Secondary | ICD-10-CM | POA: Diagnosis not present

## 2021-01-15 DIAGNOSIS — M6281 Muscle weakness (generalized): Secondary | ICD-10-CM | POA: Diagnosis not present

## 2021-01-15 DIAGNOSIS — T83511D Infection and inflammatory reaction due to indwelling urethral catheter, subsequent encounter: Secondary | ICD-10-CM | POA: Diagnosis not present

## 2021-01-16 DIAGNOSIS — M6281 Muscle weakness (generalized): Secondary | ICD-10-CM | POA: Diagnosis not present

## 2021-01-16 DIAGNOSIS — R293 Abnormal posture: Secondary | ICD-10-CM | POA: Diagnosis not present

## 2021-01-16 DIAGNOSIS — T83511D Infection and inflammatory reaction due to indwelling urethral catheter, subsequent encounter: Secondary | ICD-10-CM | POA: Diagnosis not present

## 2021-01-16 DIAGNOSIS — M6259 Muscle wasting and atrophy, not elsewhere classified, multiple sites: Secondary | ICD-10-CM | POA: Diagnosis not present

## 2021-01-16 DIAGNOSIS — N139 Obstructive and reflux uropathy, unspecified: Secondary | ICD-10-CM | POA: Diagnosis not present

## 2021-01-17 DIAGNOSIS — M6259 Muscle wasting and atrophy, not elsewhere classified, multiple sites: Secondary | ICD-10-CM | POA: Diagnosis not present

## 2021-01-17 DIAGNOSIS — R293 Abnormal posture: Secondary | ICD-10-CM | POA: Diagnosis not present

## 2021-01-17 DIAGNOSIS — N139 Obstructive and reflux uropathy, unspecified: Secondary | ICD-10-CM | POA: Diagnosis not present

## 2021-01-17 DIAGNOSIS — T83511D Infection and inflammatory reaction due to indwelling urethral catheter, subsequent encounter: Secondary | ICD-10-CM | POA: Diagnosis not present

## 2021-01-17 DIAGNOSIS — M6281 Muscle weakness (generalized): Secondary | ICD-10-CM | POA: Diagnosis not present

## 2021-01-18 DIAGNOSIS — M6259 Muscle wasting and atrophy, not elsewhere classified, multiple sites: Secondary | ICD-10-CM | POA: Diagnosis not present

## 2021-01-18 DIAGNOSIS — M6281 Muscle weakness (generalized): Secondary | ICD-10-CM | POA: Diagnosis not present

## 2021-01-18 DIAGNOSIS — N139 Obstructive and reflux uropathy, unspecified: Secondary | ICD-10-CM | POA: Diagnosis not present

## 2021-01-19 DIAGNOSIS — B351 Tinea unguium: Secondary | ICD-10-CM | POA: Diagnosis not present

## 2021-01-19 DIAGNOSIS — M6281 Muscle weakness (generalized): Secondary | ICD-10-CM | POA: Diagnosis not present

## 2021-01-19 DIAGNOSIS — M6259 Muscle wasting and atrophy, not elsewhere classified, multiple sites: Secondary | ICD-10-CM | POA: Diagnosis not present

## 2021-01-19 DIAGNOSIS — E1159 Type 2 diabetes mellitus with other circulatory complications: Secondary | ICD-10-CM | POA: Diagnosis not present

## 2021-01-19 DIAGNOSIS — N139 Obstructive and reflux uropathy, unspecified: Secondary | ICD-10-CM | POA: Diagnosis not present

## 2021-01-20 DIAGNOSIS — M6281 Muscle weakness (generalized): Secondary | ICD-10-CM | POA: Diagnosis not present

## 2021-01-20 DIAGNOSIS — N139 Obstructive and reflux uropathy, unspecified: Secondary | ICD-10-CM | POA: Diagnosis not present

## 2021-01-20 DIAGNOSIS — M6259 Muscle wasting and atrophy, not elsewhere classified, multiple sites: Secondary | ICD-10-CM | POA: Diagnosis not present

## 2021-01-23 DIAGNOSIS — M6281 Muscle weakness (generalized): Secondary | ICD-10-CM | POA: Diagnosis not present

## 2021-01-23 DIAGNOSIS — N139 Obstructive and reflux uropathy, unspecified: Secondary | ICD-10-CM | POA: Diagnosis not present

## 2021-01-23 DIAGNOSIS — M6259 Muscle wasting and atrophy, not elsewhere classified, multiple sites: Secondary | ICD-10-CM | POA: Diagnosis not present

## 2021-01-24 DIAGNOSIS — N139 Obstructive and reflux uropathy, unspecified: Secondary | ICD-10-CM | POA: Diagnosis not present

## 2021-01-24 DIAGNOSIS — M6259 Muscle wasting and atrophy, not elsewhere classified, multiple sites: Secondary | ICD-10-CM | POA: Diagnosis not present

## 2021-01-24 DIAGNOSIS — M6281 Muscle weakness (generalized): Secondary | ICD-10-CM | POA: Diagnosis not present

## 2021-01-25 DIAGNOSIS — M6259 Muscle wasting and atrophy, not elsewhere classified, multiple sites: Secondary | ICD-10-CM | POA: Diagnosis not present

## 2021-01-25 DIAGNOSIS — M6281 Muscle weakness (generalized): Secondary | ICD-10-CM | POA: Diagnosis not present

## 2021-01-25 DIAGNOSIS — N139 Obstructive and reflux uropathy, unspecified: Secondary | ICD-10-CM | POA: Diagnosis not present

## 2021-01-26 DIAGNOSIS — M6281 Muscle weakness (generalized): Secondary | ICD-10-CM | POA: Diagnosis not present

## 2021-01-26 DIAGNOSIS — N139 Obstructive and reflux uropathy, unspecified: Secondary | ICD-10-CM | POA: Diagnosis not present

## 2021-01-26 DIAGNOSIS — M6259 Muscle wasting and atrophy, not elsewhere classified, multiple sites: Secondary | ICD-10-CM | POA: Diagnosis not present

## 2021-01-26 IMAGING — US US RENAL
1 series · 14 of 23 positions shown · non-contrast
Comparison: CT 05/16/2020.  Ultrasound 09/22/2018.

CLINICAL DATA: Acute kidney injury.

EXAM:
RENAL / URINARY TRACT ULTRASOUND COMPLETE

[Series 1: us renal · 14 of 23 slices shown]
[im 1/23]
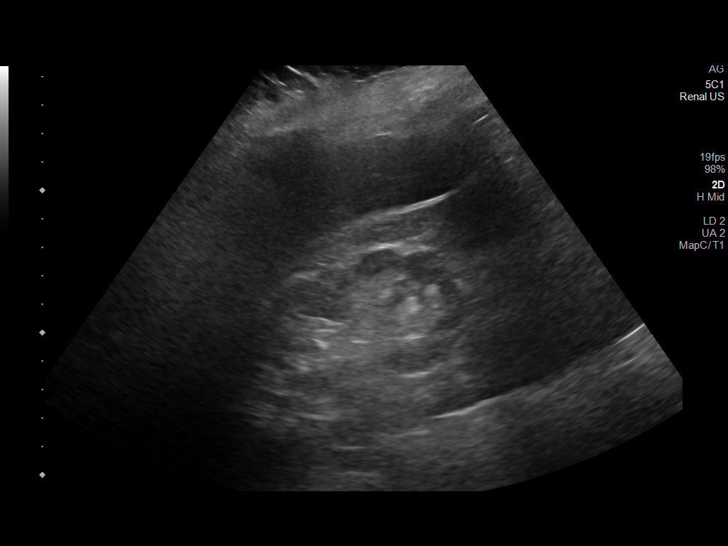
[im 3/23]
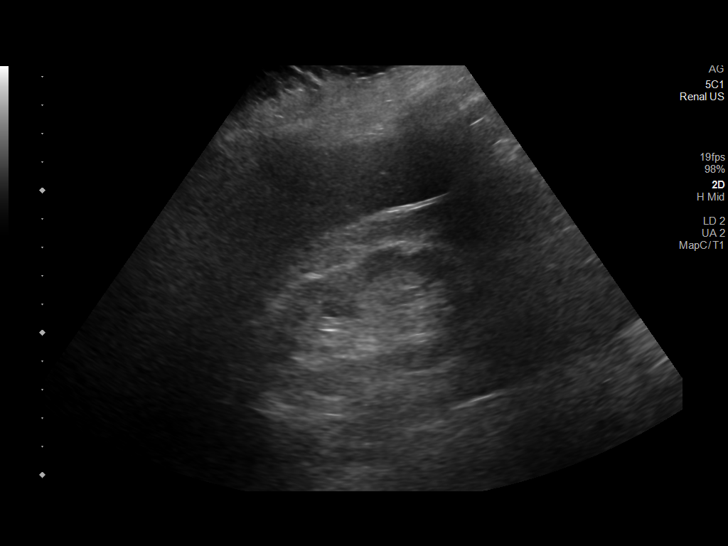
[im 5/23]
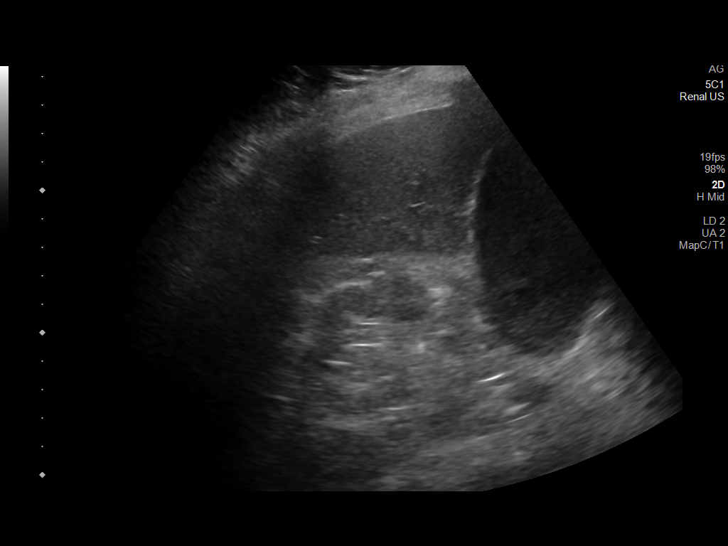
[im 6/23]
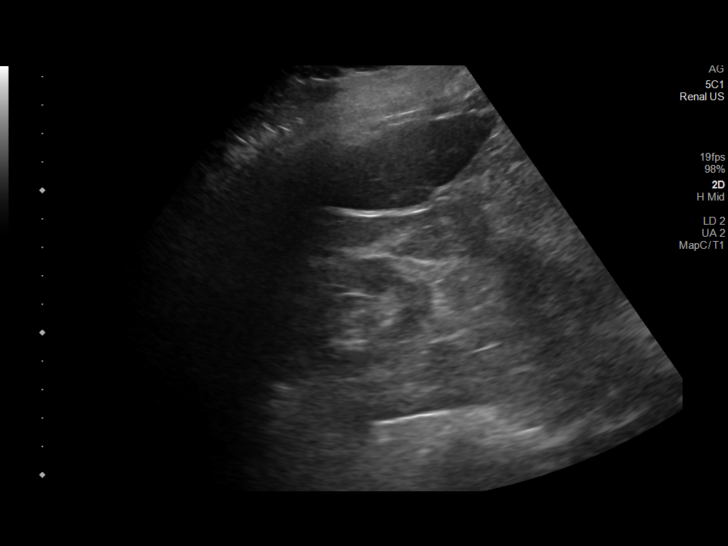
[im 8/23]
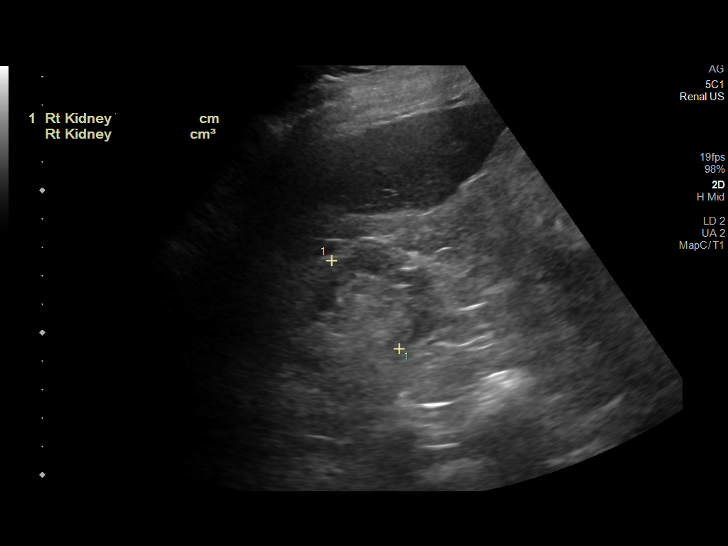
[im 10/23]
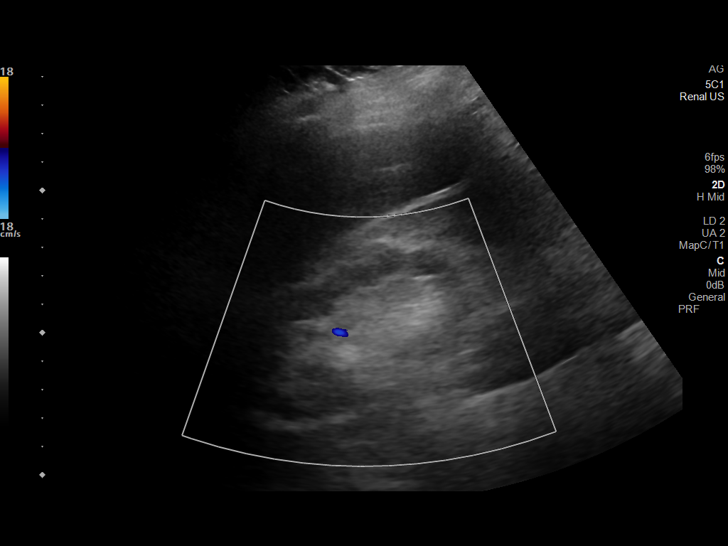
[im 11/23]
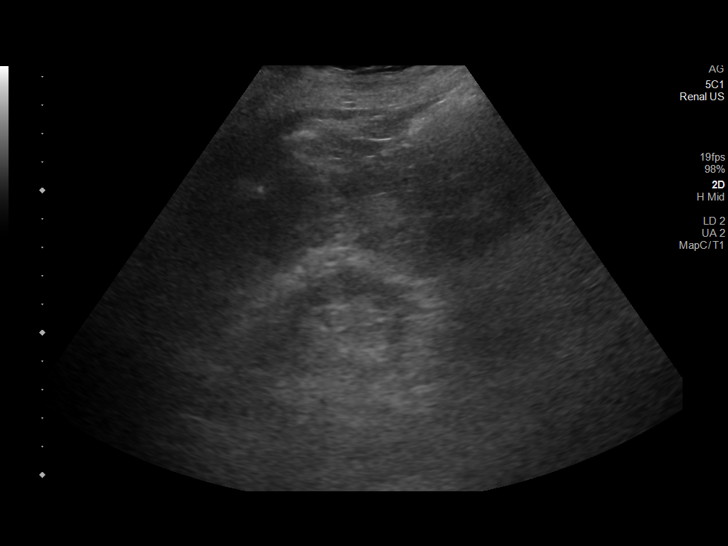
[im 13/23]
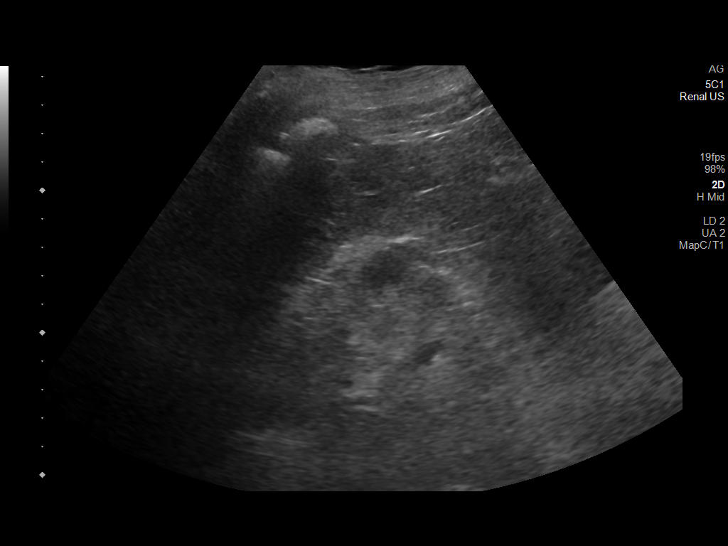
[im 14/23]
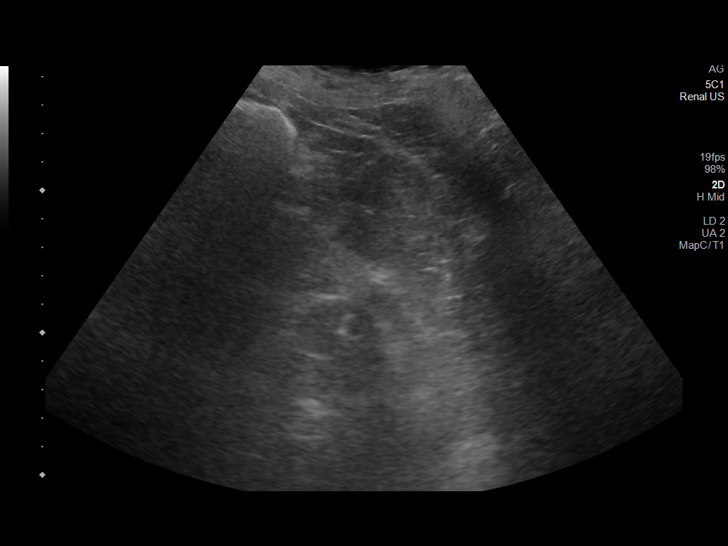
[im 16/23]
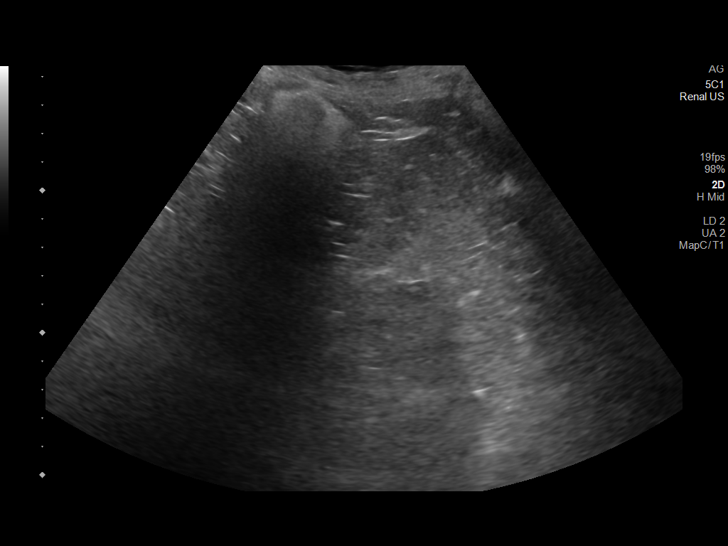
[im 18/23]
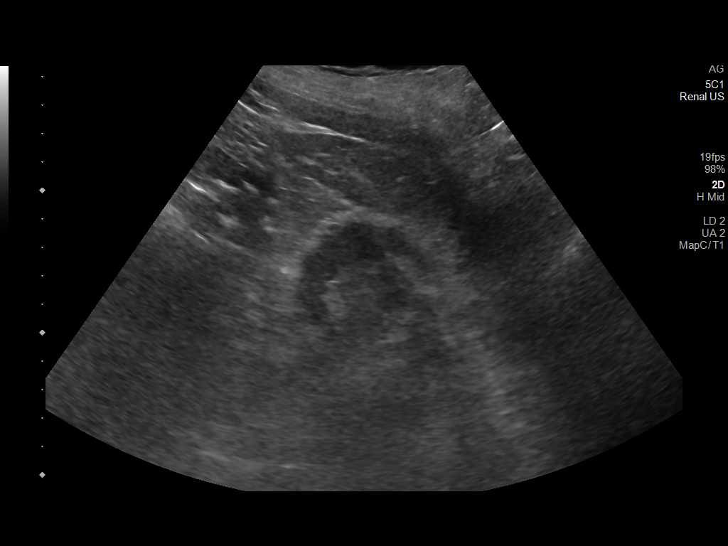
[im 19/23]
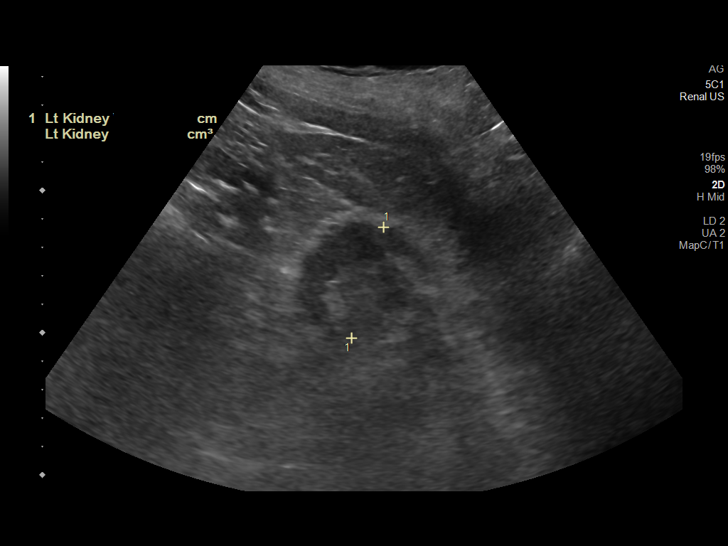
[im 21/23]
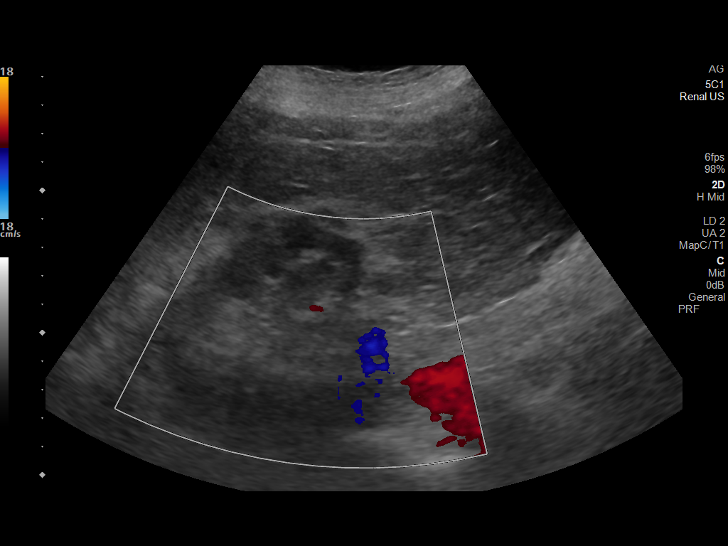
[im 23/23]
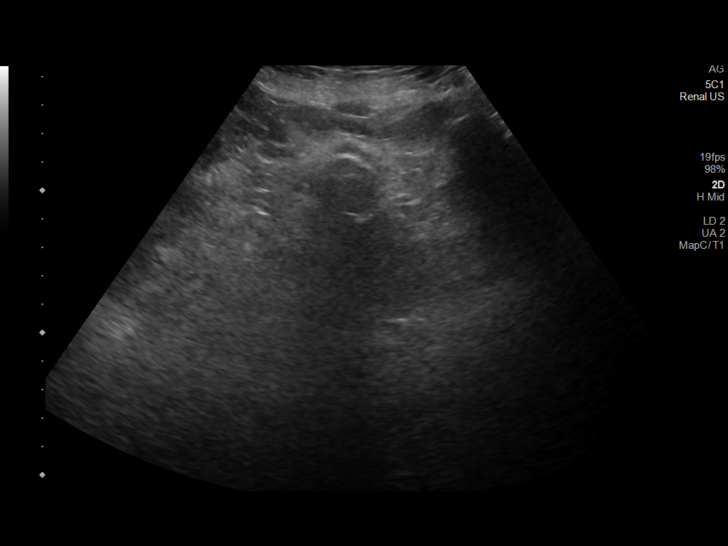

[14 of 23 positions shown; findings below may reference images not displayed]

FINDINGS: Right Kidney:

Renal measurements: 9.2 x 4.1 x 3.9 cm = volume: 77.9 mL. Renal
cortical thinning. Increased echogenicity. No mass or hydronephrosis
visualized.

Left Kidney:

Renal measurements: 8.2 x 3.7 x 4.1 cm = volume: 64.4 mL. Renal
cortical thinning. Increased echogenicity. No mass or hydronephrosis
visualized.

Bladder:

Foley catheter present.  Bladder nondistended.

Other:

None.
IMPRESSION: 1. Increased echogenicity and renal cortical thinning noted
bilaterally consistent with chronic medical renal disease.
2. No acute abnormality identified. No hydronephrosis or bladder
distention.

## 2021-01-27 DIAGNOSIS — E1022 Type 1 diabetes mellitus with diabetic chronic kidney disease: Secondary | ICD-10-CM | POA: Diagnosis not present

## 2021-01-27 DIAGNOSIS — M6259 Muscle wasting and atrophy, not elsewhere classified, multiple sites: Secondary | ICD-10-CM | POA: Diagnosis not present

## 2021-01-27 DIAGNOSIS — M6281 Muscle weakness (generalized): Secondary | ICD-10-CM | POA: Diagnosis not present

## 2021-01-27 DIAGNOSIS — Z79899 Other long term (current) drug therapy: Secondary | ICD-10-CM | POA: Diagnosis not present

## 2021-01-27 DIAGNOSIS — N139 Obstructive and reflux uropathy, unspecified: Secondary | ICD-10-CM | POA: Diagnosis not present

## 2021-01-29 DIAGNOSIS — I509 Heart failure, unspecified: Secondary | ICD-10-CM | POA: Diagnosis not present

## 2021-01-29 DIAGNOSIS — N189 Chronic kidney disease, unspecified: Secondary | ICD-10-CM | POA: Diagnosis not present

## 2021-01-29 DIAGNOSIS — E1165 Type 2 diabetes mellitus with hyperglycemia: Secondary | ICD-10-CM | POA: Diagnosis not present

## 2021-01-29 DIAGNOSIS — I480 Paroxysmal atrial fibrillation: Secondary | ICD-10-CM | POA: Diagnosis not present

## 2021-01-29 DIAGNOSIS — D508 Other iron deficiency anemias: Secondary | ICD-10-CM | POA: Diagnosis not present

## 2021-01-29 DIAGNOSIS — I4891 Unspecified atrial fibrillation: Secondary | ICD-10-CM | POA: Diagnosis not present

## 2021-01-30 DIAGNOSIS — I509 Heart failure, unspecified: Secondary | ICD-10-CM | POA: Diagnosis not present

## 2021-01-30 DIAGNOSIS — M6259 Muscle wasting and atrophy, not elsewhere classified, multiple sites: Secondary | ICD-10-CM | POA: Diagnosis not present

## 2021-01-30 DIAGNOSIS — N139 Obstructive and reflux uropathy, unspecified: Secondary | ICD-10-CM | POA: Diagnosis not present

## 2021-01-30 DIAGNOSIS — I480 Paroxysmal atrial fibrillation: Secondary | ICD-10-CM | POA: Diagnosis not present

## 2021-01-30 DIAGNOSIS — M6281 Muscle weakness (generalized): Secondary | ICD-10-CM | POA: Diagnosis not present

## 2021-01-30 DIAGNOSIS — R339 Retention of urine, unspecified: Secondary | ICD-10-CM | POA: Diagnosis not present

## 2021-01-30 DIAGNOSIS — K219 Gastro-esophageal reflux disease without esophagitis: Secondary | ICD-10-CM | POA: Diagnosis not present

## 2021-01-31 DIAGNOSIS — N139 Obstructive and reflux uropathy, unspecified: Secondary | ICD-10-CM | POA: Diagnosis not present

## 2021-01-31 DIAGNOSIS — M6259 Muscle wasting and atrophy, not elsewhere classified, multiple sites: Secondary | ICD-10-CM | POA: Diagnosis not present

## 2021-01-31 DIAGNOSIS — M6281 Muscle weakness (generalized): Secondary | ICD-10-CM | POA: Diagnosis not present

## 2021-02-01 DIAGNOSIS — G9340 Encephalopathy, unspecified: Secondary | ICD-10-CM | POA: Diagnosis not present

## 2021-02-01 DIAGNOSIS — N184 Chronic kidney disease, stage 4 (severe): Secondary | ICD-10-CM | POA: Diagnosis not present

## 2021-02-01 DIAGNOSIS — I82502 Chronic embolism and thrombosis of unspecified deep veins of left lower extremity: Secondary | ICD-10-CM | POA: Diagnosis not present

## 2021-02-01 DIAGNOSIS — N139 Obstructive and reflux uropathy, unspecified: Secondary | ICD-10-CM | POA: Diagnosis not present

## 2021-02-01 DIAGNOSIS — M6259 Muscle wasting and atrophy, not elsewhere classified, multiple sites: Secondary | ICD-10-CM | POA: Diagnosis not present

## 2021-02-01 DIAGNOSIS — M6281 Muscle weakness (generalized): Secondary | ICD-10-CM | POA: Diagnosis not present

## 2021-02-07 DIAGNOSIS — D72829 Elevated white blood cell count, unspecified: Secondary | ICD-10-CM | POA: Diagnosis not present

## 2021-02-07 DIAGNOSIS — K219 Gastro-esophageal reflux disease without esophagitis: Secondary | ICD-10-CM | POA: Diagnosis not present

## 2021-02-07 DIAGNOSIS — I509 Heart failure, unspecified: Secondary | ICD-10-CM | POA: Diagnosis not present

## 2021-02-07 DIAGNOSIS — R627 Adult failure to thrive: Secondary | ICD-10-CM | POA: Diagnosis not present

## 2021-02-07 DIAGNOSIS — I4891 Unspecified atrial fibrillation: Secondary | ICD-10-CM | POA: Diagnosis not present

## 2021-02-10 DIAGNOSIS — Z7401 Bed confinement status: Secondary | ICD-10-CM | POA: Diagnosis not present

## 2021-02-10 DIAGNOSIS — N138 Other obstructive and reflux uropathy: Secondary | ICD-10-CM | POA: Diagnosis not present

## 2021-02-10 DIAGNOSIS — R339 Retention of urine, unspecified: Secondary | ICD-10-CM | POA: Diagnosis not present

## 2021-02-10 DIAGNOSIS — I4891 Unspecified atrial fibrillation: Secondary | ICD-10-CM | POA: Diagnosis not present

## 2021-02-16 DIAGNOSIS — N139 Obstructive and reflux uropathy, unspecified: Secondary | ICD-10-CM | POA: Diagnosis not present

## 2021-02-25 DIAGNOSIS — E1165 Type 2 diabetes mellitus with hyperglycemia: Secondary | ICD-10-CM | POA: Diagnosis not present

## 2021-02-25 DIAGNOSIS — N189 Chronic kidney disease, unspecified: Secondary | ICD-10-CM | POA: Diagnosis not present

## 2021-02-25 DIAGNOSIS — I480 Paroxysmal atrial fibrillation: Secondary | ICD-10-CM | POA: Diagnosis not present

## 2021-02-25 DIAGNOSIS — I509 Heart failure, unspecified: Secondary | ICD-10-CM | POA: Diagnosis not present

## 2021-02-25 DIAGNOSIS — I4891 Unspecified atrial fibrillation: Secondary | ICD-10-CM | POA: Diagnosis not present

## 2021-02-25 DIAGNOSIS — D508 Other iron deficiency anemias: Secondary | ICD-10-CM | POA: Diagnosis not present

## 2021-02-27 DIAGNOSIS — N189 Chronic kidney disease, unspecified: Secondary | ICD-10-CM | POA: Diagnosis not present

## 2021-02-27 DIAGNOSIS — I509 Heart failure, unspecified: Secondary | ICD-10-CM | POA: Diagnosis not present

## 2021-02-27 DIAGNOSIS — I480 Paroxysmal atrial fibrillation: Secondary | ICD-10-CM | POA: Diagnosis not present

## 2021-03-03 DIAGNOSIS — I82502 Chronic embolism and thrombosis of unspecified deep veins of left lower extremity: Secondary | ICD-10-CM | POA: Diagnosis not present

## 2021-03-03 DIAGNOSIS — M6281 Muscle weakness (generalized): Secondary | ICD-10-CM | POA: Diagnosis not present

## 2021-03-03 DIAGNOSIS — N184 Chronic kidney disease, stage 4 (severe): Secondary | ICD-10-CM | POA: Diagnosis not present

## 2021-03-03 DIAGNOSIS — G9340 Encephalopathy, unspecified: Secondary | ICD-10-CM | POA: Diagnosis not present

## 2021-03-07 DIAGNOSIS — I509 Heart failure, unspecified: Secondary | ICD-10-CM | POA: Diagnosis not present

## 2021-03-07 DIAGNOSIS — I480 Paroxysmal atrial fibrillation: Secondary | ICD-10-CM | POA: Diagnosis not present

## 2021-03-07 DIAGNOSIS — N139 Obstructive and reflux uropathy, unspecified: Secondary | ICD-10-CM | POA: Diagnosis not present

## 2021-03-07 DIAGNOSIS — N189 Chronic kidney disease, unspecified: Secondary | ICD-10-CM | POA: Diagnosis not present

## 2021-03-07 DIAGNOSIS — R627 Adult failure to thrive: Secondary | ICD-10-CM | POA: Diagnosis not present

## 2021-03-07 DIAGNOSIS — I4891 Unspecified atrial fibrillation: Secondary | ICD-10-CM | POA: Diagnosis not present

## 2021-03-14 DIAGNOSIS — N139 Obstructive and reflux uropathy, unspecified: Secondary | ICD-10-CM | POA: Diagnosis not present

## 2021-03-22 DIAGNOSIS — I130 Hypertensive heart and chronic kidney disease with heart failure and stage 1 through stage 4 chronic kidney disease, or unspecified chronic kidney disease: Secondary | ICD-10-CM | POA: Diagnosis not present

## 2021-03-22 DIAGNOSIS — I509 Heart failure, unspecified: Secondary | ICD-10-CM | POA: Diagnosis not present

## 2021-03-22 DIAGNOSIS — E1165 Type 2 diabetes mellitus with hyperglycemia: Secondary | ICD-10-CM | POA: Diagnosis not present

## 2021-03-22 DIAGNOSIS — N189 Chronic kidney disease, unspecified: Secondary | ICD-10-CM | POA: Diagnosis not present

## 2021-03-22 DIAGNOSIS — I4891 Unspecified atrial fibrillation: Secondary | ICD-10-CM | POA: Diagnosis not present

## 2021-03-22 DIAGNOSIS — I480 Paroxysmal atrial fibrillation: Secondary | ICD-10-CM | POA: Diagnosis not present

## 2021-03-22 DIAGNOSIS — N139 Obstructive and reflux uropathy, unspecified: Secondary | ICD-10-CM | POA: Diagnosis not present

## 2021-03-22 DIAGNOSIS — D508 Other iron deficiency anemias: Secondary | ICD-10-CM | POA: Diagnosis not present

## 2021-03-23 DIAGNOSIS — I130 Hypertensive heart and chronic kidney disease with heart failure and stage 1 through stage 4 chronic kidney disease, or unspecified chronic kidney disease: Secondary | ICD-10-CM | POA: Diagnosis not present

## 2021-03-23 DIAGNOSIS — N139 Obstructive and reflux uropathy, unspecified: Secondary | ICD-10-CM | POA: Diagnosis not present

## 2021-03-24 DIAGNOSIS — N139 Obstructive and reflux uropathy, unspecified: Secondary | ICD-10-CM | POA: Diagnosis not present

## 2021-03-24 DIAGNOSIS — N138 Other obstructive and reflux uropathy: Secondary | ICD-10-CM | POA: Diagnosis not present

## 2021-03-24 DIAGNOSIS — I130 Hypertensive heart and chronic kidney disease with heart failure and stage 1 through stage 4 chronic kidney disease, or unspecified chronic kidney disease: Secondary | ICD-10-CM | POA: Diagnosis not present

## 2021-03-27 DIAGNOSIS — I4891 Unspecified atrial fibrillation: Secondary | ICD-10-CM | POA: Diagnosis not present

## 2021-03-27 DIAGNOSIS — I130 Hypertensive heart and chronic kidney disease with heart failure and stage 1 through stage 4 chronic kidney disease, or unspecified chronic kidney disease: Secondary | ICD-10-CM | POA: Diagnosis not present

## 2021-03-27 DIAGNOSIS — N189 Chronic kidney disease, unspecified: Secondary | ICD-10-CM | POA: Diagnosis not present

## 2021-03-27 DIAGNOSIS — E1165 Type 2 diabetes mellitus with hyperglycemia: Secondary | ICD-10-CM | POA: Diagnosis not present

## 2021-03-27 DIAGNOSIS — N139 Obstructive and reflux uropathy, unspecified: Secondary | ICD-10-CM | POA: Diagnosis not present

## 2021-03-27 DIAGNOSIS — R339 Retention of urine, unspecified: Secondary | ICD-10-CM | POA: Diagnosis not present

## 2021-03-27 DIAGNOSIS — D508 Other iron deficiency anemias: Secondary | ICD-10-CM | POA: Diagnosis not present

## 2021-03-27 DIAGNOSIS — K219 Gastro-esophageal reflux disease without esophagitis: Secondary | ICD-10-CM | POA: Diagnosis not present

## 2021-03-27 DIAGNOSIS — I509 Heart failure, unspecified: Secondary | ICD-10-CM | POA: Diagnosis not present

## 2021-03-28 DIAGNOSIS — I130 Hypertensive heart and chronic kidney disease with heart failure and stage 1 through stage 4 chronic kidney disease, or unspecified chronic kidney disease: Secondary | ICD-10-CM | POA: Diagnosis not present

## 2021-03-28 DIAGNOSIS — N139 Obstructive and reflux uropathy, unspecified: Secondary | ICD-10-CM | POA: Diagnosis not present

## 2021-03-29 DIAGNOSIS — I130 Hypertensive heart and chronic kidney disease with heart failure and stage 1 through stage 4 chronic kidney disease, or unspecified chronic kidney disease: Secondary | ICD-10-CM | POA: Diagnosis not present

## 2021-03-29 DIAGNOSIS — N139 Obstructive and reflux uropathy, unspecified: Secondary | ICD-10-CM | POA: Diagnosis not present

## 2021-03-30 DIAGNOSIS — N139 Obstructive and reflux uropathy, unspecified: Secondary | ICD-10-CM | POA: Diagnosis not present

## 2021-03-30 DIAGNOSIS — B351 Tinea unguium: Secondary | ICD-10-CM | POA: Diagnosis not present

## 2021-03-30 DIAGNOSIS — I130 Hypertensive heart and chronic kidney disease with heart failure and stage 1 through stage 4 chronic kidney disease, or unspecified chronic kidney disease: Secondary | ICD-10-CM | POA: Diagnosis not present

## 2021-03-30 DIAGNOSIS — E1159 Type 2 diabetes mellitus with other circulatory complications: Secondary | ICD-10-CM | POA: Diagnosis not present

## 2021-03-31 DIAGNOSIS — N139 Obstructive and reflux uropathy, unspecified: Secondary | ICD-10-CM | POA: Diagnosis not present

## 2021-03-31 DIAGNOSIS — I130 Hypertensive heart and chronic kidney disease with heart failure and stage 1 through stage 4 chronic kidney disease, or unspecified chronic kidney disease: Secondary | ICD-10-CM | POA: Diagnosis not present

## 2021-04-03 DIAGNOSIS — M6281 Muscle weakness (generalized): Secondary | ICD-10-CM | POA: Diagnosis not present

## 2021-04-03 DIAGNOSIS — I82502 Chronic embolism and thrombosis of unspecified deep veins of left lower extremity: Secondary | ICD-10-CM | POA: Diagnosis not present

## 2021-04-03 DIAGNOSIS — N139 Obstructive and reflux uropathy, unspecified: Secondary | ICD-10-CM | POA: Diagnosis not present

## 2021-04-03 DIAGNOSIS — I130 Hypertensive heart and chronic kidney disease with heart failure and stage 1 through stage 4 chronic kidney disease, or unspecified chronic kidney disease: Secondary | ICD-10-CM | POA: Diagnosis not present

## 2021-04-03 DIAGNOSIS — N184 Chronic kidney disease, stage 4 (severe): Secondary | ICD-10-CM | POA: Diagnosis not present

## 2021-04-03 DIAGNOSIS — G9340 Encephalopathy, unspecified: Secondary | ICD-10-CM | POA: Diagnosis not present

## 2021-04-04 DIAGNOSIS — I4891 Unspecified atrial fibrillation: Secondary | ICD-10-CM | POA: Diagnosis not present

## 2021-04-04 DIAGNOSIS — N189 Chronic kidney disease, unspecified: Secondary | ICD-10-CM | POA: Diagnosis not present

## 2021-04-04 DIAGNOSIS — I130 Hypertensive heart and chronic kidney disease with heart failure and stage 1 through stage 4 chronic kidney disease, or unspecified chronic kidney disease: Secondary | ICD-10-CM | POA: Diagnosis not present

## 2021-04-04 DIAGNOSIS — R339 Retention of urine, unspecified: Secondary | ICD-10-CM | POA: Diagnosis not present

## 2021-04-04 DIAGNOSIS — I509 Heart failure, unspecified: Secondary | ICD-10-CM | POA: Diagnosis not present

## 2021-04-04 DIAGNOSIS — N139 Obstructive and reflux uropathy, unspecified: Secondary | ICD-10-CM | POA: Diagnosis not present

## 2021-04-05 DIAGNOSIS — I130 Hypertensive heart and chronic kidney disease with heart failure and stage 1 through stage 4 chronic kidney disease, or unspecified chronic kidney disease: Secondary | ICD-10-CM | POA: Diagnosis not present

## 2021-04-05 DIAGNOSIS — N139 Obstructive and reflux uropathy, unspecified: Secondary | ICD-10-CM | POA: Diagnosis not present

## 2021-04-06 DIAGNOSIS — N139 Obstructive and reflux uropathy, unspecified: Secondary | ICD-10-CM | POA: Diagnosis not present

## 2021-04-06 DIAGNOSIS — I130 Hypertensive heart and chronic kidney disease with heart failure and stage 1 through stage 4 chronic kidney disease, or unspecified chronic kidney disease: Secondary | ICD-10-CM | POA: Diagnosis not present

## 2021-04-21 DIAGNOSIS — I130 Hypertensive heart and chronic kidney disease with heart failure and stage 1 through stage 4 chronic kidney disease, or unspecified chronic kidney disease: Secondary | ICD-10-CM | POA: Diagnosis not present

## 2021-05-02 DIAGNOSIS — N189 Chronic kidney disease, unspecified: Secondary | ICD-10-CM | POA: Diagnosis not present

## 2021-05-02 DIAGNOSIS — I480 Paroxysmal atrial fibrillation: Secondary | ICD-10-CM | POA: Diagnosis not present

## 2021-05-02 DIAGNOSIS — R627 Adult failure to thrive: Secondary | ICD-10-CM | POA: Diagnosis not present

## 2021-05-02 DIAGNOSIS — D508 Other iron deficiency anemias: Secondary | ICD-10-CM | POA: Diagnosis not present

## 2021-05-02 DIAGNOSIS — I509 Heart failure, unspecified: Secondary | ICD-10-CM | POA: Diagnosis not present

## 2021-05-02 DIAGNOSIS — E1165 Type 2 diabetes mellitus with hyperglycemia: Secondary | ICD-10-CM | POA: Diagnosis not present

## 2021-05-04 DIAGNOSIS — N184 Chronic kidney disease, stage 4 (severe): Secondary | ICD-10-CM | POA: Diagnosis not present

## 2021-05-04 DIAGNOSIS — G9340 Encephalopathy, unspecified: Secondary | ICD-10-CM | POA: Diagnosis not present

## 2021-05-04 DIAGNOSIS — I82502 Chronic embolism and thrombosis of unspecified deep veins of left lower extremity: Secondary | ICD-10-CM | POA: Diagnosis not present

## 2021-05-04 DIAGNOSIS — M6281 Muscle weakness (generalized): Secondary | ICD-10-CM | POA: Diagnosis not present

## 2021-05-08 DIAGNOSIS — D508 Other iron deficiency anemias: Secondary | ICD-10-CM | POA: Diagnosis not present

## 2021-05-08 DIAGNOSIS — I480 Paroxysmal atrial fibrillation: Secondary | ICD-10-CM | POA: Diagnosis not present

## 2021-05-08 DIAGNOSIS — I509 Heart failure, unspecified: Secondary | ICD-10-CM | POA: Diagnosis not present

## 2021-05-08 DIAGNOSIS — K219 Gastro-esophageal reflux disease without esophagitis: Secondary | ICD-10-CM | POA: Diagnosis not present

## 2021-05-08 DIAGNOSIS — N189 Chronic kidney disease, unspecified: Secondary | ICD-10-CM | POA: Diagnosis not present

## 2021-05-08 DIAGNOSIS — E1165 Type 2 diabetes mellitus with hyperglycemia: Secondary | ICD-10-CM | POA: Diagnosis not present

## 2021-05-08 DIAGNOSIS — I4891 Unspecified atrial fibrillation: Secondary | ICD-10-CM | POA: Diagnosis not present

## 2021-05-30 DIAGNOSIS — I4891 Unspecified atrial fibrillation: Secondary | ICD-10-CM | POA: Diagnosis not present

## 2021-05-30 DIAGNOSIS — E1165 Type 2 diabetes mellitus with hyperglycemia: Secondary | ICD-10-CM | POA: Diagnosis not present

## 2021-05-30 DIAGNOSIS — I509 Heart failure, unspecified: Secondary | ICD-10-CM | POA: Diagnosis not present

## 2021-05-30 DIAGNOSIS — D508 Other iron deficiency anemias: Secondary | ICD-10-CM | POA: Diagnosis not present

## 2021-05-30 DIAGNOSIS — R339 Retention of urine, unspecified: Secondary | ICD-10-CM | POA: Diagnosis not present

## 2021-05-30 DIAGNOSIS — N189 Chronic kidney disease, unspecified: Secondary | ICD-10-CM | POA: Diagnosis not present

## 2021-05-30 DIAGNOSIS — I480 Paroxysmal atrial fibrillation: Secondary | ICD-10-CM | POA: Diagnosis not present

## 2021-06-05 DIAGNOSIS — E1165 Type 2 diabetes mellitus with hyperglycemia: Secondary | ICD-10-CM | POA: Diagnosis not present

## 2021-06-05 DIAGNOSIS — I480 Paroxysmal atrial fibrillation: Secondary | ICD-10-CM | POA: Diagnosis not present

## 2021-06-05 DIAGNOSIS — D508 Other iron deficiency anemias: Secondary | ICD-10-CM | POA: Diagnosis not present

## 2021-06-05 DIAGNOSIS — K219 Gastro-esophageal reflux disease without esophagitis: Secondary | ICD-10-CM | POA: Diagnosis not present

## 2021-06-05 DIAGNOSIS — I509 Heart failure, unspecified: Secondary | ICD-10-CM | POA: Diagnosis not present

## 2021-06-05 DIAGNOSIS — N189 Chronic kidney disease, unspecified: Secondary | ICD-10-CM | POA: Diagnosis not present

## 2021-06-20 DIAGNOSIS — R3589 Other polyuria: Secondary | ICD-10-CM | POA: Diagnosis not present

## 2021-06-20 DIAGNOSIS — N138 Other obstructive and reflux uropathy: Secondary | ICD-10-CM | POA: Diagnosis not present

## 2021-06-21 DIAGNOSIS — R2681 Unsteadiness on feet: Secondary | ICD-10-CM | POA: Diagnosis not present

## 2021-06-21 DIAGNOSIS — G8929 Other chronic pain: Secondary | ICD-10-CM | POA: Diagnosis not present

## 2021-06-21 DIAGNOSIS — R1312 Dysphagia, oropharyngeal phase: Secondary | ICD-10-CM | POA: Diagnosis not present

## 2021-06-21 DIAGNOSIS — M6281 Muscle weakness (generalized): Secondary | ICD-10-CM | POA: Diagnosis not present

## 2021-06-21 DIAGNOSIS — M1611 Unilateral primary osteoarthritis, right hip: Secondary | ICD-10-CM | POA: Diagnosis not present

## 2021-06-21 DIAGNOSIS — M25612 Stiffness of left shoulder, not elsewhere classified: Secondary | ICD-10-CM | POA: Diagnosis not present

## 2021-06-21 DIAGNOSIS — I130 Hypertensive heart and chronic kidney disease with heart failure and stage 1 through stage 4 chronic kidney disease, or unspecified chronic kidney disease: Secondary | ICD-10-CM | POA: Diagnosis not present

## 2021-06-21 DIAGNOSIS — N139 Obstructive and reflux uropathy, unspecified: Secondary | ICD-10-CM | POA: Diagnosis not present

## 2021-06-22 DIAGNOSIS — M6281 Muscle weakness (generalized): Secondary | ICD-10-CM | POA: Diagnosis not present

## 2021-06-22 DIAGNOSIS — G8929 Other chronic pain: Secondary | ICD-10-CM | POA: Diagnosis not present

## 2021-06-22 DIAGNOSIS — M1611 Unilateral primary osteoarthritis, right hip: Secondary | ICD-10-CM | POA: Diagnosis not present

## 2021-06-22 DIAGNOSIS — I130 Hypertensive heart and chronic kidney disease with heart failure and stage 1 through stage 4 chronic kidney disease, or unspecified chronic kidney disease: Secondary | ICD-10-CM | POA: Diagnosis not present

## 2021-06-22 DIAGNOSIS — M25612 Stiffness of left shoulder, not elsewhere classified: Secondary | ICD-10-CM | POA: Diagnosis not present

## 2021-06-22 DIAGNOSIS — R1312 Dysphagia, oropharyngeal phase: Secondary | ICD-10-CM | POA: Diagnosis not present

## 2021-06-22 DIAGNOSIS — R2681 Unsteadiness on feet: Secondary | ICD-10-CM | POA: Diagnosis not present

## 2021-06-22 DIAGNOSIS — N139 Obstructive and reflux uropathy, unspecified: Secondary | ICD-10-CM | POA: Diagnosis not present

## 2021-06-23 DIAGNOSIS — M6281 Muscle weakness (generalized): Secondary | ICD-10-CM | POA: Diagnosis not present

## 2021-06-23 DIAGNOSIS — R1312 Dysphagia, oropharyngeal phase: Secondary | ICD-10-CM | POA: Diagnosis not present

## 2021-06-23 DIAGNOSIS — G8929 Other chronic pain: Secondary | ICD-10-CM | POA: Diagnosis not present

## 2021-06-23 DIAGNOSIS — R2681 Unsteadiness on feet: Secondary | ICD-10-CM | POA: Diagnosis not present

## 2021-06-23 DIAGNOSIS — I130 Hypertensive heart and chronic kidney disease with heart failure and stage 1 through stage 4 chronic kidney disease, or unspecified chronic kidney disease: Secondary | ICD-10-CM | POA: Diagnosis not present

## 2021-06-23 DIAGNOSIS — M1611 Unilateral primary osteoarthritis, right hip: Secondary | ICD-10-CM | POA: Diagnosis not present

## 2021-06-23 DIAGNOSIS — M25612 Stiffness of left shoulder, not elsewhere classified: Secondary | ICD-10-CM | POA: Diagnosis not present

## 2021-06-23 DIAGNOSIS — N139 Obstructive and reflux uropathy, unspecified: Secondary | ICD-10-CM | POA: Diagnosis not present

## 2021-06-26 DIAGNOSIS — E119 Type 2 diabetes mellitus without complications: Secondary | ICD-10-CM | POA: Diagnosis not present

## 2021-06-26 DIAGNOSIS — N139 Obstructive and reflux uropathy, unspecified: Secondary | ICD-10-CM | POA: Diagnosis not present

## 2021-06-26 DIAGNOSIS — I480 Paroxysmal atrial fibrillation: Secondary | ICD-10-CM | POA: Diagnosis not present

## 2021-06-26 DIAGNOSIS — D508 Other iron deficiency anemias: Secondary | ICD-10-CM | POA: Diagnosis not present

## 2021-06-26 DIAGNOSIS — E1165 Type 2 diabetes mellitus with hyperglycemia: Secondary | ICD-10-CM | POA: Diagnosis not present

## 2021-06-26 DIAGNOSIS — M6281 Muscle weakness (generalized): Secondary | ICD-10-CM | POA: Diagnosis not present

## 2021-06-26 DIAGNOSIS — M25612 Stiffness of left shoulder, not elsewhere classified: Secondary | ICD-10-CM | POA: Diagnosis not present

## 2021-06-26 DIAGNOSIS — I509 Heart failure, unspecified: Secondary | ICD-10-CM | POA: Diagnosis not present

## 2021-06-26 DIAGNOSIS — R2681 Unsteadiness on feet: Secondary | ICD-10-CM | POA: Diagnosis not present

## 2021-06-26 DIAGNOSIS — I4891 Unspecified atrial fibrillation: Secondary | ICD-10-CM | POA: Diagnosis not present

## 2021-06-26 DIAGNOSIS — I130 Hypertensive heart and chronic kidney disease with heart failure and stage 1 through stage 4 chronic kidney disease, or unspecified chronic kidney disease: Secondary | ICD-10-CM | POA: Diagnosis not present

## 2021-06-26 DIAGNOSIS — N189 Chronic kidney disease, unspecified: Secondary | ICD-10-CM | POA: Diagnosis not present

## 2021-06-26 DIAGNOSIS — M1611 Unilateral primary osteoarthritis, right hip: Secondary | ICD-10-CM | POA: Diagnosis not present

## 2021-06-26 DIAGNOSIS — R1312 Dysphagia, oropharyngeal phase: Secondary | ICD-10-CM | POA: Diagnosis not present

## 2021-06-26 DIAGNOSIS — G8929 Other chronic pain: Secondary | ICD-10-CM | POA: Diagnosis not present

## 2021-06-27 DIAGNOSIS — M25612 Stiffness of left shoulder, not elsewhere classified: Secondary | ICD-10-CM | POA: Diagnosis not present

## 2021-06-27 DIAGNOSIS — N139 Obstructive and reflux uropathy, unspecified: Secondary | ICD-10-CM | POA: Diagnosis not present

## 2021-06-27 DIAGNOSIS — G8929 Other chronic pain: Secondary | ICD-10-CM | POA: Diagnosis not present

## 2021-06-27 DIAGNOSIS — R2681 Unsteadiness on feet: Secondary | ICD-10-CM | POA: Diagnosis not present

## 2021-06-27 DIAGNOSIS — M1611 Unilateral primary osteoarthritis, right hip: Secondary | ICD-10-CM | POA: Diagnosis not present

## 2021-06-27 DIAGNOSIS — N189 Chronic kidney disease, unspecified: Secondary | ICD-10-CM | POA: Diagnosis not present

## 2021-06-27 DIAGNOSIS — I509 Heart failure, unspecified: Secondary | ICD-10-CM | POA: Diagnosis not present

## 2021-06-27 DIAGNOSIS — D508 Other iron deficiency anemias: Secondary | ICD-10-CM | POA: Diagnosis not present

## 2021-06-27 DIAGNOSIS — R1312 Dysphagia, oropharyngeal phase: Secondary | ICD-10-CM | POA: Diagnosis not present

## 2021-06-27 DIAGNOSIS — M6281 Muscle weakness (generalized): Secondary | ICD-10-CM | POA: Diagnosis not present

## 2021-06-27 DIAGNOSIS — I130 Hypertensive heart and chronic kidney disease with heart failure and stage 1 through stage 4 chronic kidney disease, or unspecified chronic kidney disease: Secondary | ICD-10-CM | POA: Diagnosis not present

## 2021-06-27 DIAGNOSIS — I4891 Unspecified atrial fibrillation: Secondary | ICD-10-CM | POA: Diagnosis not present

## 2021-06-28 DIAGNOSIS — M6281 Muscle weakness (generalized): Secondary | ICD-10-CM | POA: Diagnosis not present

## 2021-06-28 DIAGNOSIS — N139 Obstructive and reflux uropathy, unspecified: Secondary | ICD-10-CM | POA: Diagnosis not present

## 2021-06-28 DIAGNOSIS — G8929 Other chronic pain: Secondary | ICD-10-CM | POA: Diagnosis not present

## 2021-06-28 DIAGNOSIS — R2681 Unsteadiness on feet: Secondary | ICD-10-CM | POA: Diagnosis not present

## 2021-06-28 DIAGNOSIS — I130 Hypertensive heart and chronic kidney disease with heart failure and stage 1 through stage 4 chronic kidney disease, or unspecified chronic kidney disease: Secondary | ICD-10-CM | POA: Diagnosis not present

## 2021-06-28 DIAGNOSIS — M25612 Stiffness of left shoulder, not elsewhere classified: Secondary | ICD-10-CM | POA: Diagnosis not present

## 2021-06-28 DIAGNOSIS — M1611 Unilateral primary osteoarthritis, right hip: Secondary | ICD-10-CM | POA: Diagnosis not present

## 2021-06-28 DIAGNOSIS — R1312 Dysphagia, oropharyngeal phase: Secondary | ICD-10-CM | POA: Diagnosis not present

## 2021-06-29 DIAGNOSIS — G8929 Other chronic pain: Secondary | ICD-10-CM | POA: Diagnosis not present

## 2021-06-29 DIAGNOSIS — M25612 Stiffness of left shoulder, not elsewhere classified: Secondary | ICD-10-CM | POA: Diagnosis not present

## 2021-06-29 DIAGNOSIS — R2681 Unsteadiness on feet: Secondary | ICD-10-CM | POA: Diagnosis not present

## 2021-06-29 DIAGNOSIS — M1611 Unilateral primary osteoarthritis, right hip: Secondary | ICD-10-CM | POA: Diagnosis not present

## 2021-06-29 DIAGNOSIS — M6281 Muscle weakness (generalized): Secondary | ICD-10-CM | POA: Diagnosis not present

## 2021-06-29 DIAGNOSIS — R1312 Dysphagia, oropharyngeal phase: Secondary | ICD-10-CM | POA: Diagnosis not present

## 2021-06-29 DIAGNOSIS — N139 Obstructive and reflux uropathy, unspecified: Secondary | ICD-10-CM | POA: Diagnosis not present

## 2021-06-29 DIAGNOSIS — I130 Hypertensive heart and chronic kidney disease with heart failure and stage 1 through stage 4 chronic kidney disease, or unspecified chronic kidney disease: Secondary | ICD-10-CM | POA: Diagnosis not present

## 2021-07-03 DIAGNOSIS — R2681 Unsteadiness on feet: Secondary | ICD-10-CM | POA: Diagnosis not present

## 2021-07-03 DIAGNOSIS — M25612 Stiffness of left shoulder, not elsewhere classified: Secondary | ICD-10-CM | POA: Diagnosis not present

## 2021-07-03 DIAGNOSIS — R1312 Dysphagia, oropharyngeal phase: Secondary | ICD-10-CM | POA: Diagnosis not present

## 2021-07-03 DIAGNOSIS — G8929 Other chronic pain: Secondary | ICD-10-CM | POA: Diagnosis not present

## 2021-07-03 DIAGNOSIS — M1611 Unilateral primary osteoarthritis, right hip: Secondary | ICD-10-CM | POA: Diagnosis not present

## 2021-07-03 DIAGNOSIS — M6281 Muscle weakness (generalized): Secondary | ICD-10-CM | POA: Diagnosis not present

## 2021-07-03 DIAGNOSIS — N139 Obstructive and reflux uropathy, unspecified: Secondary | ICD-10-CM | POA: Diagnosis not present

## 2021-07-03 DIAGNOSIS — I130 Hypertensive heart and chronic kidney disease with heart failure and stage 1 through stage 4 chronic kidney disease, or unspecified chronic kidney disease: Secondary | ICD-10-CM | POA: Diagnosis not present

## 2021-07-04 DIAGNOSIS — M1611 Unilateral primary osteoarthritis, right hip: Secondary | ICD-10-CM | POA: Diagnosis not present

## 2021-07-04 DIAGNOSIS — N139 Obstructive and reflux uropathy, unspecified: Secondary | ICD-10-CM | POA: Diagnosis not present

## 2021-07-04 DIAGNOSIS — M6281 Muscle weakness (generalized): Secondary | ICD-10-CM | POA: Diagnosis not present

## 2021-07-04 DIAGNOSIS — E860 Dehydration: Secondary | ICD-10-CM | POA: Diagnosis not present

## 2021-07-04 DIAGNOSIS — G8929 Other chronic pain: Secondary | ICD-10-CM | POA: Diagnosis not present

## 2021-07-04 DIAGNOSIS — M25612 Stiffness of left shoulder, not elsewhere classified: Secondary | ICD-10-CM | POA: Diagnosis not present

## 2021-07-04 DIAGNOSIS — R1312 Dysphagia, oropharyngeal phase: Secondary | ICD-10-CM | POA: Diagnosis not present

## 2021-07-04 DIAGNOSIS — I130 Hypertensive heart and chronic kidney disease with heart failure and stage 1 through stage 4 chronic kidney disease, or unspecified chronic kidney disease: Secondary | ICD-10-CM | POA: Diagnosis not present

## 2021-07-04 DIAGNOSIS — R2681 Unsteadiness on feet: Secondary | ICD-10-CM | POA: Diagnosis not present

## 2021-07-05 DIAGNOSIS — I130 Hypertensive heart and chronic kidney disease with heart failure and stage 1 through stage 4 chronic kidney disease, or unspecified chronic kidney disease: Secondary | ICD-10-CM | POA: Diagnosis not present

## 2021-07-05 DIAGNOSIS — G8929 Other chronic pain: Secondary | ICD-10-CM | POA: Diagnosis not present

## 2021-07-05 DIAGNOSIS — M1611 Unilateral primary osteoarthritis, right hip: Secondary | ICD-10-CM | POA: Diagnosis not present

## 2021-07-05 DIAGNOSIS — N139 Obstructive and reflux uropathy, unspecified: Secondary | ICD-10-CM | POA: Diagnosis not present

## 2021-07-05 DIAGNOSIS — M25612 Stiffness of left shoulder, not elsewhere classified: Secondary | ICD-10-CM | POA: Diagnosis not present

## 2021-07-05 DIAGNOSIS — R1312 Dysphagia, oropharyngeal phase: Secondary | ICD-10-CM | POA: Diagnosis not present

## 2021-07-05 DIAGNOSIS — M6281 Muscle weakness (generalized): Secondary | ICD-10-CM | POA: Diagnosis not present

## 2021-07-05 DIAGNOSIS — R2681 Unsteadiness on feet: Secondary | ICD-10-CM | POA: Diagnosis not present

## 2021-07-06 DIAGNOSIS — I130 Hypertensive heart and chronic kidney disease with heart failure and stage 1 through stage 4 chronic kidney disease, or unspecified chronic kidney disease: Secondary | ICD-10-CM | POA: Diagnosis not present

## 2021-07-06 DIAGNOSIS — R2681 Unsteadiness on feet: Secondary | ICD-10-CM | POA: Diagnosis not present

## 2021-07-06 DIAGNOSIS — G8929 Other chronic pain: Secondary | ICD-10-CM | POA: Diagnosis not present

## 2021-07-06 DIAGNOSIS — R1312 Dysphagia, oropharyngeal phase: Secondary | ICD-10-CM | POA: Diagnosis not present

## 2021-07-06 DIAGNOSIS — N139 Obstructive and reflux uropathy, unspecified: Secondary | ICD-10-CM | POA: Diagnosis not present

## 2021-07-06 DIAGNOSIS — M25612 Stiffness of left shoulder, not elsewhere classified: Secondary | ICD-10-CM | POA: Diagnosis not present

## 2021-07-06 DIAGNOSIS — M6281 Muscle weakness (generalized): Secondary | ICD-10-CM | POA: Diagnosis not present

## 2021-07-06 DIAGNOSIS — M1611 Unilateral primary osteoarthritis, right hip: Secondary | ICD-10-CM | POA: Diagnosis not present

## 2021-07-07 DIAGNOSIS — R2681 Unsteadiness on feet: Secondary | ICD-10-CM | POA: Diagnosis not present

## 2021-07-07 DIAGNOSIS — M6281 Muscle weakness (generalized): Secondary | ICD-10-CM | POA: Diagnosis not present

## 2021-07-07 DIAGNOSIS — R1312 Dysphagia, oropharyngeal phase: Secondary | ICD-10-CM | POA: Diagnosis not present

## 2021-07-07 DIAGNOSIS — M1611 Unilateral primary osteoarthritis, right hip: Secondary | ICD-10-CM | POA: Diagnosis not present

## 2021-07-07 DIAGNOSIS — I130 Hypertensive heart and chronic kidney disease with heart failure and stage 1 through stage 4 chronic kidney disease, or unspecified chronic kidney disease: Secondary | ICD-10-CM | POA: Diagnosis not present

## 2021-07-07 DIAGNOSIS — N139 Obstructive and reflux uropathy, unspecified: Secondary | ICD-10-CM | POA: Diagnosis not present

## 2021-07-07 DIAGNOSIS — M25612 Stiffness of left shoulder, not elsewhere classified: Secondary | ICD-10-CM | POA: Diagnosis not present

## 2021-07-07 DIAGNOSIS — G8929 Other chronic pain: Secondary | ICD-10-CM | POA: Diagnosis not present

## 2021-07-10 DIAGNOSIS — M1611 Unilateral primary osteoarthritis, right hip: Secondary | ICD-10-CM | POA: Diagnosis not present

## 2021-07-10 DIAGNOSIS — I130 Hypertensive heart and chronic kidney disease with heart failure and stage 1 through stage 4 chronic kidney disease, or unspecified chronic kidney disease: Secondary | ICD-10-CM | POA: Diagnosis not present

## 2021-07-10 DIAGNOSIS — G8929 Other chronic pain: Secondary | ICD-10-CM | POA: Diagnosis not present

## 2021-07-10 DIAGNOSIS — N139 Obstructive and reflux uropathy, unspecified: Secondary | ICD-10-CM | POA: Diagnosis not present

## 2021-07-10 DIAGNOSIS — M25612 Stiffness of left shoulder, not elsewhere classified: Secondary | ICD-10-CM | POA: Diagnosis not present

## 2021-07-10 DIAGNOSIS — M6281 Muscle weakness (generalized): Secondary | ICD-10-CM | POA: Diagnosis not present

## 2021-07-10 DIAGNOSIS — I509 Heart failure, unspecified: Secondary | ICD-10-CM | POA: Diagnosis not present

## 2021-07-10 DIAGNOSIS — I4891 Unspecified atrial fibrillation: Secondary | ICD-10-CM | POA: Diagnosis not present

## 2021-07-10 DIAGNOSIS — K219 Gastro-esophageal reflux disease without esophagitis: Secondary | ICD-10-CM | POA: Diagnosis not present

## 2021-07-10 DIAGNOSIS — R1312 Dysphagia, oropharyngeal phase: Secondary | ICD-10-CM | POA: Diagnosis not present

## 2021-07-10 DIAGNOSIS — N189 Chronic kidney disease, unspecified: Secondary | ICD-10-CM | POA: Diagnosis not present

## 2021-07-10 DIAGNOSIS — R2681 Unsteadiness on feet: Secondary | ICD-10-CM | POA: Diagnosis not present

## 2021-07-10 DIAGNOSIS — E119 Type 2 diabetes mellitus without complications: Secondary | ICD-10-CM | POA: Diagnosis not present

## 2021-07-11 DIAGNOSIS — I130 Hypertensive heart and chronic kidney disease with heart failure and stage 1 through stage 4 chronic kidney disease, or unspecified chronic kidney disease: Secondary | ICD-10-CM | POA: Diagnosis not present

## 2021-07-11 DIAGNOSIS — M6281 Muscle weakness (generalized): Secondary | ICD-10-CM | POA: Diagnosis not present

## 2021-07-11 DIAGNOSIS — M25612 Stiffness of left shoulder, not elsewhere classified: Secondary | ICD-10-CM | POA: Diagnosis not present

## 2021-07-11 DIAGNOSIS — R2681 Unsteadiness on feet: Secondary | ICD-10-CM | POA: Diagnosis not present

## 2021-07-11 DIAGNOSIS — M1611 Unilateral primary osteoarthritis, right hip: Secondary | ICD-10-CM | POA: Diagnosis not present

## 2021-07-11 DIAGNOSIS — R1312 Dysphagia, oropharyngeal phase: Secondary | ICD-10-CM | POA: Diagnosis not present

## 2021-07-11 DIAGNOSIS — G8929 Other chronic pain: Secondary | ICD-10-CM | POA: Diagnosis not present

## 2021-07-11 DIAGNOSIS — N139 Obstructive and reflux uropathy, unspecified: Secondary | ICD-10-CM | POA: Diagnosis not present

## 2021-07-12 DIAGNOSIS — M6281 Muscle weakness (generalized): Secondary | ICD-10-CM | POA: Diagnosis not present

## 2021-07-12 DIAGNOSIS — R1312 Dysphagia, oropharyngeal phase: Secondary | ICD-10-CM | POA: Diagnosis not present

## 2021-07-12 DIAGNOSIS — G8929 Other chronic pain: Secondary | ICD-10-CM | POA: Diagnosis not present

## 2021-07-12 DIAGNOSIS — R2681 Unsteadiness on feet: Secondary | ICD-10-CM | POA: Diagnosis not present

## 2021-07-12 DIAGNOSIS — M25612 Stiffness of left shoulder, not elsewhere classified: Secondary | ICD-10-CM | POA: Diagnosis not present

## 2021-07-12 DIAGNOSIS — I130 Hypertensive heart and chronic kidney disease with heart failure and stage 1 through stage 4 chronic kidney disease, or unspecified chronic kidney disease: Secondary | ICD-10-CM | POA: Diagnosis not present

## 2021-07-12 DIAGNOSIS — N139 Obstructive and reflux uropathy, unspecified: Secondary | ICD-10-CM | POA: Diagnosis not present

## 2021-07-12 DIAGNOSIS — M1611 Unilateral primary osteoarthritis, right hip: Secondary | ICD-10-CM | POA: Diagnosis not present

## 2021-07-13 DIAGNOSIS — I130 Hypertensive heart and chronic kidney disease with heart failure and stage 1 through stage 4 chronic kidney disease, or unspecified chronic kidney disease: Secondary | ICD-10-CM | POA: Diagnosis not present

## 2021-07-13 DIAGNOSIS — M25612 Stiffness of left shoulder, not elsewhere classified: Secondary | ICD-10-CM | POA: Diagnosis not present

## 2021-07-13 DIAGNOSIS — R1312 Dysphagia, oropharyngeal phase: Secondary | ICD-10-CM | POA: Diagnosis not present

## 2021-07-13 DIAGNOSIS — M1611 Unilateral primary osteoarthritis, right hip: Secondary | ICD-10-CM | POA: Diagnosis not present

## 2021-07-13 DIAGNOSIS — N139 Obstructive and reflux uropathy, unspecified: Secondary | ICD-10-CM | POA: Diagnosis not present

## 2021-07-13 DIAGNOSIS — M6281 Muscle weakness (generalized): Secondary | ICD-10-CM | POA: Diagnosis not present

## 2021-07-13 DIAGNOSIS — R2681 Unsteadiness on feet: Secondary | ICD-10-CM | POA: Diagnosis not present

## 2021-07-13 DIAGNOSIS — G8929 Other chronic pain: Secondary | ICD-10-CM | POA: Diagnosis not present

## 2021-07-14 DIAGNOSIS — M6281 Muscle weakness (generalized): Secondary | ICD-10-CM | POA: Diagnosis not present

## 2021-07-14 DIAGNOSIS — G8929 Other chronic pain: Secondary | ICD-10-CM | POA: Diagnosis not present

## 2021-07-14 DIAGNOSIS — R2681 Unsteadiness on feet: Secondary | ICD-10-CM | POA: Diagnosis not present

## 2021-07-14 DIAGNOSIS — I130 Hypertensive heart and chronic kidney disease with heart failure and stage 1 through stage 4 chronic kidney disease, or unspecified chronic kidney disease: Secondary | ICD-10-CM | POA: Diagnosis not present

## 2021-07-14 DIAGNOSIS — M1611 Unilateral primary osteoarthritis, right hip: Secondary | ICD-10-CM | POA: Diagnosis not present

## 2021-07-14 DIAGNOSIS — R1312 Dysphagia, oropharyngeal phase: Secondary | ICD-10-CM | POA: Diagnosis not present

## 2021-07-14 DIAGNOSIS — M25612 Stiffness of left shoulder, not elsewhere classified: Secondary | ICD-10-CM | POA: Diagnosis not present

## 2021-07-14 DIAGNOSIS — N139 Obstructive and reflux uropathy, unspecified: Secondary | ICD-10-CM | POA: Diagnosis not present

## 2021-07-17 DIAGNOSIS — N139 Obstructive and reflux uropathy, unspecified: Secondary | ICD-10-CM | POA: Diagnosis not present

## 2021-07-17 DIAGNOSIS — G8929 Other chronic pain: Secondary | ICD-10-CM | POA: Diagnosis not present

## 2021-07-17 DIAGNOSIS — R2681 Unsteadiness on feet: Secondary | ICD-10-CM | POA: Diagnosis not present

## 2021-07-17 DIAGNOSIS — M1611 Unilateral primary osteoarthritis, right hip: Secondary | ICD-10-CM | POA: Diagnosis not present

## 2021-07-17 DIAGNOSIS — I130 Hypertensive heart and chronic kidney disease with heart failure and stage 1 through stage 4 chronic kidney disease, or unspecified chronic kidney disease: Secondary | ICD-10-CM | POA: Diagnosis not present

## 2021-07-17 DIAGNOSIS — M6281 Muscle weakness (generalized): Secondary | ICD-10-CM | POA: Diagnosis not present

## 2021-07-17 DIAGNOSIS — R1312 Dysphagia, oropharyngeal phase: Secondary | ICD-10-CM | POA: Diagnosis not present

## 2021-07-17 DIAGNOSIS — M25612 Stiffness of left shoulder, not elsewhere classified: Secondary | ICD-10-CM | POA: Diagnosis not present

## 2021-07-18 DIAGNOSIS — M1611 Unilateral primary osteoarthritis, right hip: Secondary | ICD-10-CM | POA: Diagnosis not present

## 2021-07-18 DIAGNOSIS — R1312 Dysphagia, oropharyngeal phase: Secondary | ICD-10-CM | POA: Diagnosis not present

## 2021-07-18 DIAGNOSIS — G8929 Other chronic pain: Secondary | ICD-10-CM | POA: Diagnosis not present

## 2021-07-18 DIAGNOSIS — M25612 Stiffness of left shoulder, not elsewhere classified: Secondary | ICD-10-CM | POA: Diagnosis not present

## 2021-07-18 DIAGNOSIS — I130 Hypertensive heart and chronic kidney disease with heart failure and stage 1 through stage 4 chronic kidney disease, or unspecified chronic kidney disease: Secondary | ICD-10-CM | POA: Diagnosis not present

## 2021-07-18 DIAGNOSIS — N139 Obstructive and reflux uropathy, unspecified: Secondary | ICD-10-CM | POA: Diagnosis not present

## 2021-07-18 DIAGNOSIS — M6281 Muscle weakness (generalized): Secondary | ICD-10-CM | POA: Diagnosis not present

## 2021-07-18 DIAGNOSIS — R2681 Unsteadiness on feet: Secondary | ICD-10-CM | POA: Diagnosis not present

## 2021-07-19 DIAGNOSIS — M6281 Muscle weakness (generalized): Secondary | ICD-10-CM | POA: Diagnosis not present

## 2021-07-19 DIAGNOSIS — N139 Obstructive and reflux uropathy, unspecified: Secondary | ICD-10-CM | POA: Diagnosis not present

## 2021-07-19 DIAGNOSIS — I130 Hypertensive heart and chronic kidney disease with heart failure and stage 1 through stage 4 chronic kidney disease, or unspecified chronic kidney disease: Secondary | ICD-10-CM | POA: Diagnosis not present

## 2021-07-19 DIAGNOSIS — G8929 Other chronic pain: Secondary | ICD-10-CM | POA: Diagnosis not present

## 2021-07-19 DIAGNOSIS — M1611 Unilateral primary osteoarthritis, right hip: Secondary | ICD-10-CM | POA: Diagnosis not present

## 2021-07-19 DIAGNOSIS — R2681 Unsteadiness on feet: Secondary | ICD-10-CM | POA: Diagnosis not present

## 2021-07-19 DIAGNOSIS — R1312 Dysphagia, oropharyngeal phase: Secondary | ICD-10-CM | POA: Diagnosis not present

## 2021-07-19 DIAGNOSIS — M25612 Stiffness of left shoulder, not elsewhere classified: Secondary | ICD-10-CM | POA: Diagnosis not present

## 2021-07-25 DIAGNOSIS — E119 Type 2 diabetes mellitus without complications: Secondary | ICD-10-CM | POA: Diagnosis not present

## 2021-07-25 DIAGNOSIS — I509 Heart failure, unspecified: Secondary | ICD-10-CM | POA: Diagnosis not present

## 2021-07-25 DIAGNOSIS — I4891 Unspecified atrial fibrillation: Secondary | ICD-10-CM | POA: Diagnosis not present

## 2021-08-07 DIAGNOSIS — E119 Type 2 diabetes mellitus without complications: Secondary | ICD-10-CM | POA: Diagnosis not present

## 2021-08-07 DIAGNOSIS — I509 Heart failure, unspecified: Secondary | ICD-10-CM | POA: Diagnosis not present

## 2021-08-07 DIAGNOSIS — K219 Gastro-esophageal reflux disease without esophagitis: Secondary | ICD-10-CM | POA: Diagnosis not present

## 2021-08-07 DIAGNOSIS — I4891 Unspecified atrial fibrillation: Secondary | ICD-10-CM | POA: Diagnosis not present

## 2021-08-07 DIAGNOSIS — N189 Chronic kidney disease, unspecified: Secondary | ICD-10-CM | POA: Diagnosis not present

## 2021-08-07 DIAGNOSIS — D508 Other iron deficiency anemias: Secondary | ICD-10-CM | POA: Diagnosis not present

## 2021-08-10 DIAGNOSIS — N189 Chronic kidney disease, unspecified: Secondary | ICD-10-CM | POA: Diagnosis not present

## 2021-08-10 DIAGNOSIS — I480 Paroxysmal atrial fibrillation: Secondary | ICD-10-CM | POA: Diagnosis not present

## 2021-08-10 DIAGNOSIS — I509 Heart failure, unspecified: Secondary | ICD-10-CM | POA: Diagnosis not present

## 2021-08-10 DIAGNOSIS — E1165 Type 2 diabetes mellitus with hyperglycemia: Secondary | ICD-10-CM | POA: Diagnosis not present

## 2021-08-10 DIAGNOSIS — I4891 Unspecified atrial fibrillation: Secondary | ICD-10-CM | POA: Diagnosis not present

## 2021-08-10 DIAGNOSIS — D508 Other iron deficiency anemias: Secondary | ICD-10-CM | POA: Diagnosis not present

## 2021-08-10 DIAGNOSIS — E119 Type 2 diabetes mellitus without complications: Secondary | ICD-10-CM | POA: Diagnosis not present

## 2021-08-29 DIAGNOSIS — I509 Heart failure, unspecified: Secondary | ICD-10-CM | POA: Diagnosis not present

## 2021-08-29 DIAGNOSIS — I4891 Unspecified atrial fibrillation: Secondary | ICD-10-CM | POA: Diagnosis not present

## 2021-08-29 DIAGNOSIS — K219 Gastro-esophageal reflux disease without esophagitis: Secondary | ICD-10-CM | POA: Diagnosis not present

## 2021-08-29 DIAGNOSIS — E119 Type 2 diabetes mellitus without complications: Secondary | ICD-10-CM | POA: Diagnosis not present

## 2021-08-29 DIAGNOSIS — N139 Obstructive and reflux uropathy, unspecified: Secondary | ICD-10-CM | POA: Diagnosis not present

## 2021-08-30 DIAGNOSIS — D508 Other iron deficiency anemias: Secondary | ICD-10-CM | POA: Diagnosis not present

## 2021-08-30 DIAGNOSIS — E119 Type 2 diabetes mellitus without complications: Secondary | ICD-10-CM | POA: Diagnosis not present

## 2021-08-30 DIAGNOSIS — E1165 Type 2 diabetes mellitus with hyperglycemia: Secondary | ICD-10-CM | POA: Diagnosis not present

## 2021-08-30 DIAGNOSIS — I509 Heart failure, unspecified: Secondary | ICD-10-CM | POA: Diagnosis not present

## 2021-08-30 DIAGNOSIS — N189 Chronic kidney disease, unspecified: Secondary | ICD-10-CM | POA: Diagnosis not present

## 2021-08-30 DIAGNOSIS — I480 Paroxysmal atrial fibrillation: Secondary | ICD-10-CM | POA: Diagnosis not present

## 2021-08-30 DIAGNOSIS — I4891 Unspecified atrial fibrillation: Secondary | ICD-10-CM | POA: Diagnosis not present

## 2021-09-06 DIAGNOSIS — E1165 Type 2 diabetes mellitus with hyperglycemia: Secondary | ICD-10-CM | POA: Diagnosis not present

## 2021-09-06 DIAGNOSIS — I4891 Unspecified atrial fibrillation: Secondary | ICD-10-CM | POA: Diagnosis not present

## 2021-09-06 DIAGNOSIS — I509 Heart failure, unspecified: Secondary | ICD-10-CM | POA: Diagnosis not present

## 2021-09-06 DIAGNOSIS — D508 Other iron deficiency anemias: Secondary | ICD-10-CM | POA: Diagnosis not present

## 2021-09-06 DIAGNOSIS — K219 Gastro-esophageal reflux disease without esophagitis: Secondary | ICD-10-CM | POA: Diagnosis not present

## 2021-09-11 DIAGNOSIS — H401134 Primary open-angle glaucoma, bilateral, indeterminate stage: Secondary | ICD-10-CM | POA: Diagnosis not present

## 2021-09-11 DIAGNOSIS — Z961 Presence of intraocular lens: Secondary | ICD-10-CM | POA: Diagnosis not present

## 2021-09-11 DIAGNOSIS — E1165 Type 2 diabetes mellitus with hyperglycemia: Secondary | ICD-10-CM | POA: Diagnosis not present

## 2021-09-13 DIAGNOSIS — I130 Hypertensive heart and chronic kidney disease with heart failure and stage 1 through stage 4 chronic kidney disease, or unspecified chronic kidney disease: Secondary | ICD-10-CM | POA: Diagnosis not present

## 2021-09-13 DIAGNOSIS — N139 Obstructive and reflux uropathy, unspecified: Secondary | ICD-10-CM | POA: Diagnosis not present

## 2021-09-13 DIAGNOSIS — M6281 Muscle weakness (generalized): Secondary | ICD-10-CM | POA: Diagnosis not present

## 2021-09-13 DIAGNOSIS — R52 Pain, unspecified: Secondary | ICD-10-CM | POA: Diagnosis not present

## 2021-09-13 DIAGNOSIS — Z23 Encounter for immunization: Secondary | ICD-10-CM | POA: Diagnosis not present

## 2021-09-13 DIAGNOSIS — R2681 Unsteadiness on feet: Secondary | ICD-10-CM | POA: Diagnosis not present

## 2021-09-14 DIAGNOSIS — I130 Hypertensive heart and chronic kidney disease with heart failure and stage 1 through stage 4 chronic kidney disease, or unspecified chronic kidney disease: Secondary | ICD-10-CM | POA: Diagnosis not present

## 2021-09-14 DIAGNOSIS — R2681 Unsteadiness on feet: Secondary | ICD-10-CM | POA: Diagnosis not present

## 2021-09-14 DIAGNOSIS — M6281 Muscle weakness (generalized): Secondary | ICD-10-CM | POA: Diagnosis not present

## 2021-09-14 DIAGNOSIS — R52 Pain, unspecified: Secondary | ICD-10-CM | POA: Diagnosis not present

## 2021-09-14 DIAGNOSIS — N139 Obstructive and reflux uropathy, unspecified: Secondary | ICD-10-CM | POA: Diagnosis not present

## 2021-09-15 DIAGNOSIS — I130 Hypertensive heart and chronic kidney disease with heart failure and stage 1 through stage 4 chronic kidney disease, or unspecified chronic kidney disease: Secondary | ICD-10-CM | POA: Diagnosis not present

## 2021-09-15 DIAGNOSIS — R2681 Unsteadiness on feet: Secondary | ICD-10-CM | POA: Diagnosis not present

## 2021-09-15 DIAGNOSIS — M6281 Muscle weakness (generalized): Secondary | ICD-10-CM | POA: Diagnosis not present

## 2021-09-15 DIAGNOSIS — R52 Pain, unspecified: Secondary | ICD-10-CM | POA: Diagnosis not present

## 2021-09-15 DIAGNOSIS — N139 Obstructive and reflux uropathy, unspecified: Secondary | ICD-10-CM | POA: Diagnosis not present

## 2021-09-18 DIAGNOSIS — N139 Obstructive and reflux uropathy, unspecified: Secondary | ICD-10-CM | POA: Diagnosis not present

## 2021-09-18 DIAGNOSIS — M6281 Muscle weakness (generalized): Secondary | ICD-10-CM | POA: Diagnosis not present

## 2021-09-18 DIAGNOSIS — R52 Pain, unspecified: Secondary | ICD-10-CM | POA: Diagnosis not present

## 2021-09-18 DIAGNOSIS — R2681 Unsteadiness on feet: Secondary | ICD-10-CM | POA: Diagnosis not present

## 2021-09-18 DIAGNOSIS — I130 Hypertensive heart and chronic kidney disease with heart failure and stage 1 through stage 4 chronic kidney disease, or unspecified chronic kidney disease: Secondary | ICD-10-CM | POA: Diagnosis not present

## 2021-09-19 DIAGNOSIS — R2681 Unsteadiness on feet: Secondary | ICD-10-CM | POA: Diagnosis not present

## 2021-09-19 DIAGNOSIS — I130 Hypertensive heart and chronic kidney disease with heart failure and stage 1 through stage 4 chronic kidney disease, or unspecified chronic kidney disease: Secondary | ICD-10-CM | POA: Diagnosis not present

## 2021-09-19 DIAGNOSIS — R300 Dysuria: Secondary | ICD-10-CM | POA: Diagnosis not present

## 2021-09-19 DIAGNOSIS — R52 Pain, unspecified: Secondary | ICD-10-CM | POA: Diagnosis not present

## 2021-09-19 DIAGNOSIS — N139 Obstructive and reflux uropathy, unspecified: Secondary | ICD-10-CM | POA: Diagnosis not present

## 2021-09-19 DIAGNOSIS — N289 Disorder of kidney and ureter, unspecified: Secondary | ICD-10-CM | POA: Diagnosis not present

## 2021-09-19 DIAGNOSIS — M6281 Muscle weakness (generalized): Secondary | ICD-10-CM | POA: Diagnosis not present

## 2021-09-20 DIAGNOSIS — I130 Hypertensive heart and chronic kidney disease with heart failure and stage 1 through stage 4 chronic kidney disease, or unspecified chronic kidney disease: Secondary | ICD-10-CM | POA: Diagnosis not present

## 2021-09-20 DIAGNOSIS — R2681 Unsteadiness on feet: Secondary | ICD-10-CM | POA: Diagnosis not present

## 2021-09-20 DIAGNOSIS — R52 Pain, unspecified: Secondary | ICD-10-CM | POA: Diagnosis not present

## 2021-09-20 DIAGNOSIS — M6281 Muscle weakness (generalized): Secondary | ICD-10-CM | POA: Diagnosis not present

## 2021-09-20 DIAGNOSIS — N139 Obstructive and reflux uropathy, unspecified: Secondary | ICD-10-CM | POA: Diagnosis not present

## 2021-09-21 DIAGNOSIS — R52 Pain, unspecified: Secondary | ICD-10-CM | POA: Diagnosis not present

## 2021-09-21 DIAGNOSIS — I130 Hypertensive heart and chronic kidney disease with heart failure and stage 1 through stage 4 chronic kidney disease, or unspecified chronic kidney disease: Secondary | ICD-10-CM | POA: Diagnosis not present

## 2021-09-21 DIAGNOSIS — R2681 Unsteadiness on feet: Secondary | ICD-10-CM | POA: Diagnosis not present

## 2021-09-21 DIAGNOSIS — N179 Acute kidney failure, unspecified: Secondary | ICD-10-CM | POA: Diagnosis not present

## 2021-09-21 DIAGNOSIS — N139 Obstructive and reflux uropathy, unspecified: Secondary | ICD-10-CM | POA: Diagnosis not present

## 2021-09-21 DIAGNOSIS — N390 Urinary tract infection, site not specified: Secondary | ICD-10-CM | POA: Diagnosis not present

## 2021-09-21 DIAGNOSIS — I4891 Unspecified atrial fibrillation: Secondary | ICD-10-CM | POA: Diagnosis not present

## 2021-09-21 DIAGNOSIS — M6281 Muscle weakness (generalized): Secondary | ICD-10-CM | POA: Diagnosis not present

## 2021-09-22 DIAGNOSIS — R2681 Unsteadiness on feet: Secondary | ICD-10-CM | POA: Diagnosis not present

## 2021-09-22 DIAGNOSIS — N139 Obstructive and reflux uropathy, unspecified: Secondary | ICD-10-CM | POA: Diagnosis not present

## 2021-09-22 DIAGNOSIS — I130 Hypertensive heart and chronic kidney disease with heart failure and stage 1 through stage 4 chronic kidney disease, or unspecified chronic kidney disease: Secondary | ICD-10-CM | POA: Diagnosis not present

## 2021-09-22 DIAGNOSIS — M6281 Muscle weakness (generalized): Secondary | ICD-10-CM | POA: Diagnosis not present

## 2021-09-22 DIAGNOSIS — R52 Pain, unspecified: Secondary | ICD-10-CM | POA: Diagnosis not present

## 2021-09-25 DIAGNOSIS — I130 Hypertensive heart and chronic kidney disease with heart failure and stage 1 through stage 4 chronic kidney disease, or unspecified chronic kidney disease: Secondary | ICD-10-CM | POA: Diagnosis not present

## 2021-09-25 DIAGNOSIS — R2681 Unsteadiness on feet: Secondary | ICD-10-CM | POA: Diagnosis not present

## 2021-09-25 DIAGNOSIS — M6281 Muscle weakness (generalized): Secondary | ICD-10-CM | POA: Diagnosis not present

## 2021-09-25 DIAGNOSIS — R52 Pain, unspecified: Secondary | ICD-10-CM | POA: Diagnosis not present

## 2021-09-25 DIAGNOSIS — N139 Obstructive and reflux uropathy, unspecified: Secondary | ICD-10-CM | POA: Diagnosis not present

## 2021-09-26 DIAGNOSIS — N189 Chronic kidney disease, unspecified: Secondary | ICD-10-CM | POA: Diagnosis not present

## 2021-09-26 DIAGNOSIS — E119 Type 2 diabetes mellitus without complications: Secondary | ICD-10-CM | POA: Diagnosis not present

## 2021-09-26 DIAGNOSIS — M6281 Muscle weakness (generalized): Secondary | ICD-10-CM | POA: Diagnosis not present

## 2021-09-26 DIAGNOSIS — R52 Pain, unspecified: Secondary | ICD-10-CM | POA: Diagnosis not present

## 2021-09-26 DIAGNOSIS — I130 Hypertensive heart and chronic kidney disease with heart failure and stage 1 through stage 4 chronic kidney disease, or unspecified chronic kidney disease: Secondary | ICD-10-CM | POA: Diagnosis not present

## 2021-09-26 DIAGNOSIS — N139 Obstructive and reflux uropathy, unspecified: Secondary | ICD-10-CM | POA: Diagnosis not present

## 2021-09-26 DIAGNOSIS — R2681 Unsteadiness on feet: Secondary | ICD-10-CM | POA: Diagnosis not present

## 2021-09-26 DIAGNOSIS — I4891 Unspecified atrial fibrillation: Secondary | ICD-10-CM | POA: Diagnosis not present

## 2021-09-26 DIAGNOSIS — R339 Retention of urine, unspecified: Secondary | ICD-10-CM | POA: Diagnosis not present

## 2021-09-27 DIAGNOSIS — R2681 Unsteadiness on feet: Secondary | ICD-10-CM | POA: Diagnosis not present

## 2021-09-27 DIAGNOSIS — I130 Hypertensive heart and chronic kidney disease with heart failure and stage 1 through stage 4 chronic kidney disease, or unspecified chronic kidney disease: Secondary | ICD-10-CM | POA: Diagnosis not present

## 2021-09-27 DIAGNOSIS — R52 Pain, unspecified: Secondary | ICD-10-CM | POA: Diagnosis not present

## 2021-09-27 DIAGNOSIS — N139 Obstructive and reflux uropathy, unspecified: Secondary | ICD-10-CM | POA: Diagnosis not present

## 2021-09-27 DIAGNOSIS — M6281 Muscle weakness (generalized): Secondary | ICD-10-CM | POA: Diagnosis not present

## 2021-09-28 DIAGNOSIS — M6281 Muscle weakness (generalized): Secondary | ICD-10-CM | POA: Diagnosis not present

## 2021-09-28 DIAGNOSIS — B351 Tinea unguium: Secondary | ICD-10-CM | POA: Diagnosis not present

## 2021-09-28 DIAGNOSIS — R2681 Unsteadiness on feet: Secondary | ICD-10-CM | POA: Diagnosis not present

## 2021-09-28 DIAGNOSIS — R52 Pain, unspecified: Secondary | ICD-10-CM | POA: Diagnosis not present

## 2021-09-28 DIAGNOSIS — N139 Obstructive and reflux uropathy, unspecified: Secondary | ICD-10-CM | POA: Diagnosis not present

## 2021-09-28 DIAGNOSIS — I130 Hypertensive heart and chronic kidney disease with heart failure and stage 1 through stage 4 chronic kidney disease, or unspecified chronic kidney disease: Secondary | ICD-10-CM | POA: Diagnosis not present

## 2021-09-28 DIAGNOSIS — E1159 Type 2 diabetes mellitus with other circulatory complications: Secondary | ICD-10-CM | POA: Diagnosis not present

## 2021-09-29 DIAGNOSIS — I130 Hypertensive heart and chronic kidney disease with heart failure and stage 1 through stage 4 chronic kidney disease, or unspecified chronic kidney disease: Secondary | ICD-10-CM | POA: Diagnosis not present

## 2021-09-29 DIAGNOSIS — M6281 Muscle weakness (generalized): Secondary | ICD-10-CM | POA: Diagnosis not present

## 2021-09-29 DIAGNOSIS — R52 Pain, unspecified: Secondary | ICD-10-CM | POA: Diagnosis not present

## 2021-09-29 DIAGNOSIS — N139 Obstructive and reflux uropathy, unspecified: Secondary | ICD-10-CM | POA: Diagnosis not present

## 2021-09-29 DIAGNOSIS — R2681 Unsteadiness on feet: Secondary | ICD-10-CM | POA: Diagnosis not present

## 2021-10-02 DIAGNOSIS — N139 Obstructive and reflux uropathy, unspecified: Secondary | ICD-10-CM | POA: Diagnosis not present

## 2021-10-02 DIAGNOSIS — I130 Hypertensive heart and chronic kidney disease with heart failure and stage 1 through stage 4 chronic kidney disease, or unspecified chronic kidney disease: Secondary | ICD-10-CM | POA: Diagnosis not present

## 2021-10-02 DIAGNOSIS — R52 Pain, unspecified: Secondary | ICD-10-CM | POA: Diagnosis not present

## 2021-10-02 DIAGNOSIS — R2681 Unsteadiness on feet: Secondary | ICD-10-CM | POA: Diagnosis not present

## 2021-10-02 DIAGNOSIS — M6281 Muscle weakness (generalized): Secondary | ICD-10-CM | POA: Diagnosis not present

## 2021-10-03 DIAGNOSIS — R2681 Unsteadiness on feet: Secondary | ICD-10-CM | POA: Diagnosis not present

## 2021-10-03 DIAGNOSIS — M6281 Muscle weakness (generalized): Secondary | ICD-10-CM | POA: Diagnosis not present

## 2021-10-03 DIAGNOSIS — N139 Obstructive and reflux uropathy, unspecified: Secondary | ICD-10-CM | POA: Diagnosis not present

## 2021-10-03 DIAGNOSIS — R52 Pain, unspecified: Secondary | ICD-10-CM | POA: Diagnosis not present

## 2021-10-03 DIAGNOSIS — I130 Hypertensive heart and chronic kidney disease with heart failure and stage 1 through stage 4 chronic kidney disease, or unspecified chronic kidney disease: Secondary | ICD-10-CM | POA: Diagnosis not present

## 2021-10-04 DIAGNOSIS — N189 Chronic kidney disease, unspecified: Secondary | ICD-10-CM | POA: Diagnosis not present

## 2021-10-04 DIAGNOSIS — N390 Urinary tract infection, site not specified: Secondary | ICD-10-CM | POA: Diagnosis not present

## 2021-10-04 DIAGNOSIS — I4891 Unspecified atrial fibrillation: Secondary | ICD-10-CM | POA: Diagnosis not present

## 2021-10-04 DIAGNOSIS — I509 Heart failure, unspecified: Secondary | ICD-10-CM | POA: Diagnosis not present

## 2021-10-04 DIAGNOSIS — E119 Type 2 diabetes mellitus without complications: Secondary | ICD-10-CM | POA: Diagnosis not present

## 2021-10-05 DIAGNOSIS — N139 Obstructive and reflux uropathy, unspecified: Secondary | ICD-10-CM | POA: Diagnosis not present

## 2021-10-05 DIAGNOSIS — R2681 Unsteadiness on feet: Secondary | ICD-10-CM | POA: Diagnosis not present

## 2021-10-05 DIAGNOSIS — M6281 Muscle weakness (generalized): Secondary | ICD-10-CM | POA: Diagnosis not present

## 2021-10-05 DIAGNOSIS — R52 Pain, unspecified: Secondary | ICD-10-CM | POA: Diagnosis not present

## 2021-10-05 DIAGNOSIS — I130 Hypertensive heart and chronic kidney disease with heart failure and stage 1 through stage 4 chronic kidney disease, or unspecified chronic kidney disease: Secondary | ICD-10-CM | POA: Diagnosis not present

## 2021-10-06 DIAGNOSIS — M6281 Muscle weakness (generalized): Secondary | ICD-10-CM | POA: Diagnosis not present

## 2021-10-06 DIAGNOSIS — N139 Obstructive and reflux uropathy, unspecified: Secondary | ICD-10-CM | POA: Diagnosis not present

## 2021-10-06 DIAGNOSIS — R52 Pain, unspecified: Secondary | ICD-10-CM | POA: Diagnosis not present

## 2021-10-06 DIAGNOSIS — I130 Hypertensive heart and chronic kidney disease with heart failure and stage 1 through stage 4 chronic kidney disease, or unspecified chronic kidney disease: Secondary | ICD-10-CM | POA: Diagnosis not present

## 2021-10-06 DIAGNOSIS — R2681 Unsteadiness on feet: Secondary | ICD-10-CM | POA: Diagnosis not present

## 2021-10-09 DIAGNOSIS — I130 Hypertensive heart and chronic kidney disease with heart failure and stage 1 through stage 4 chronic kidney disease, or unspecified chronic kidney disease: Secondary | ICD-10-CM | POA: Diagnosis not present

## 2021-10-09 DIAGNOSIS — M6281 Muscle weakness (generalized): Secondary | ICD-10-CM | POA: Diagnosis not present

## 2021-10-09 DIAGNOSIS — R52 Pain, unspecified: Secondary | ICD-10-CM | POA: Diagnosis not present

## 2021-10-09 DIAGNOSIS — R2681 Unsteadiness on feet: Secondary | ICD-10-CM | POA: Diagnosis not present

## 2021-10-09 DIAGNOSIS — N139 Obstructive and reflux uropathy, unspecified: Secondary | ICD-10-CM | POA: Diagnosis not present

## 2021-10-10 DIAGNOSIS — N139 Obstructive and reflux uropathy, unspecified: Secondary | ICD-10-CM | POA: Diagnosis not present

## 2021-10-10 DIAGNOSIS — M6281 Muscle weakness (generalized): Secondary | ICD-10-CM | POA: Diagnosis not present

## 2021-10-10 DIAGNOSIS — R52 Pain, unspecified: Secondary | ICD-10-CM | POA: Diagnosis not present

## 2021-10-10 DIAGNOSIS — I130 Hypertensive heart and chronic kidney disease with heart failure and stage 1 through stage 4 chronic kidney disease, or unspecified chronic kidney disease: Secondary | ICD-10-CM | POA: Diagnosis not present

## 2021-10-10 DIAGNOSIS — R2681 Unsteadiness on feet: Secondary | ICD-10-CM | POA: Diagnosis not present

## 2021-10-11 DIAGNOSIS — R52 Pain, unspecified: Secondary | ICD-10-CM | POA: Diagnosis not present

## 2021-10-11 DIAGNOSIS — R2681 Unsteadiness on feet: Secondary | ICD-10-CM | POA: Diagnosis not present

## 2021-10-11 DIAGNOSIS — M6281 Muscle weakness (generalized): Secondary | ICD-10-CM | POA: Diagnosis not present

## 2021-10-11 DIAGNOSIS — N139 Obstructive and reflux uropathy, unspecified: Secondary | ICD-10-CM | POA: Diagnosis not present

## 2021-10-11 DIAGNOSIS — I130 Hypertensive heart and chronic kidney disease with heart failure and stage 1 through stage 4 chronic kidney disease, or unspecified chronic kidney disease: Secondary | ICD-10-CM | POA: Diagnosis not present

## 2021-10-12 DIAGNOSIS — I509 Heart failure, unspecified: Secondary | ICD-10-CM | POA: Diagnosis not present

## 2021-10-12 DIAGNOSIS — I480 Paroxysmal atrial fibrillation: Secondary | ICD-10-CM | POA: Diagnosis not present

## 2021-10-12 DIAGNOSIS — E1165 Type 2 diabetes mellitus with hyperglycemia: Secondary | ICD-10-CM | POA: Diagnosis not present

## 2021-10-12 DIAGNOSIS — I4891 Unspecified atrial fibrillation: Secondary | ICD-10-CM | POA: Diagnosis not present

## 2021-10-12 DIAGNOSIS — E119 Type 2 diabetes mellitus without complications: Secondary | ICD-10-CM | POA: Diagnosis not present

## 2021-10-12 DIAGNOSIS — D508 Other iron deficiency anemias: Secondary | ICD-10-CM | POA: Diagnosis not present

## 2021-10-12 DIAGNOSIS — N189 Chronic kidney disease, unspecified: Secondary | ICD-10-CM | POA: Diagnosis not present

## 2021-10-13 DIAGNOSIS — M6281 Muscle weakness (generalized): Secondary | ICD-10-CM | POA: Diagnosis not present

## 2021-10-13 DIAGNOSIS — N139 Obstructive and reflux uropathy, unspecified: Secondary | ICD-10-CM | POA: Diagnosis not present

## 2021-10-13 DIAGNOSIS — I130 Hypertensive heart and chronic kidney disease with heart failure and stage 1 through stage 4 chronic kidney disease, or unspecified chronic kidney disease: Secondary | ICD-10-CM | POA: Diagnosis not present

## 2021-10-13 DIAGNOSIS — R52 Pain, unspecified: Secondary | ICD-10-CM | POA: Diagnosis not present

## 2021-10-13 DIAGNOSIS — R2681 Unsteadiness on feet: Secondary | ICD-10-CM | POA: Diagnosis not present

## 2021-10-16 DIAGNOSIS — I130 Hypertensive heart and chronic kidney disease with heart failure and stage 1 through stage 4 chronic kidney disease, or unspecified chronic kidney disease: Secondary | ICD-10-CM | POA: Diagnosis not present

## 2021-10-16 DIAGNOSIS — M6281 Muscle weakness (generalized): Secondary | ICD-10-CM | POA: Diagnosis not present

## 2021-10-16 DIAGNOSIS — N139 Obstructive and reflux uropathy, unspecified: Secondary | ICD-10-CM | POA: Diagnosis not present

## 2021-10-16 DIAGNOSIS — R52 Pain, unspecified: Secondary | ICD-10-CM | POA: Diagnosis not present

## 2021-10-16 DIAGNOSIS — R2681 Unsteadiness on feet: Secondary | ICD-10-CM | POA: Diagnosis not present

## 2021-10-17 DIAGNOSIS — R52 Pain, unspecified: Secondary | ICD-10-CM | POA: Diagnosis not present

## 2021-10-17 DIAGNOSIS — N139 Obstructive and reflux uropathy, unspecified: Secondary | ICD-10-CM | POA: Diagnosis not present

## 2021-10-17 DIAGNOSIS — M6281 Muscle weakness (generalized): Secondary | ICD-10-CM | POA: Diagnosis not present

## 2021-10-17 DIAGNOSIS — I130 Hypertensive heart and chronic kidney disease with heart failure and stage 1 through stage 4 chronic kidney disease, or unspecified chronic kidney disease: Secondary | ICD-10-CM | POA: Diagnosis not present

## 2021-10-17 DIAGNOSIS — R2681 Unsteadiness on feet: Secondary | ICD-10-CM | POA: Diagnosis not present

## 2021-10-18 DIAGNOSIS — M6281 Muscle weakness (generalized): Secondary | ICD-10-CM | POA: Diagnosis not present

## 2021-10-18 DIAGNOSIS — R2681 Unsteadiness on feet: Secondary | ICD-10-CM | POA: Diagnosis not present

## 2021-10-18 DIAGNOSIS — I130 Hypertensive heart and chronic kidney disease with heart failure and stage 1 through stage 4 chronic kidney disease, or unspecified chronic kidney disease: Secondary | ICD-10-CM | POA: Diagnosis not present

## 2021-10-18 DIAGNOSIS — N139 Obstructive and reflux uropathy, unspecified: Secondary | ICD-10-CM | POA: Diagnosis not present

## 2021-10-18 DIAGNOSIS — G8929 Other chronic pain: Secondary | ICD-10-CM | POA: Diagnosis not present

## 2021-10-19 DIAGNOSIS — M6281 Muscle weakness (generalized): Secondary | ICD-10-CM | POA: Diagnosis not present

## 2021-10-19 DIAGNOSIS — G8929 Other chronic pain: Secondary | ICD-10-CM | POA: Diagnosis not present

## 2021-10-19 DIAGNOSIS — N139 Obstructive and reflux uropathy, unspecified: Secondary | ICD-10-CM | POA: Diagnosis not present

## 2021-10-19 DIAGNOSIS — R2681 Unsteadiness on feet: Secondary | ICD-10-CM | POA: Diagnosis not present

## 2021-10-19 DIAGNOSIS — I130 Hypertensive heart and chronic kidney disease with heart failure and stage 1 through stage 4 chronic kidney disease, or unspecified chronic kidney disease: Secondary | ICD-10-CM | POA: Diagnosis not present

## 2021-10-20 DIAGNOSIS — I130 Hypertensive heart and chronic kidney disease with heart failure and stage 1 through stage 4 chronic kidney disease, or unspecified chronic kidney disease: Secondary | ICD-10-CM | POA: Diagnosis not present

## 2021-10-20 DIAGNOSIS — M6281 Muscle weakness (generalized): Secondary | ICD-10-CM | POA: Diagnosis not present

## 2021-10-20 DIAGNOSIS — G8929 Other chronic pain: Secondary | ICD-10-CM | POA: Diagnosis not present

## 2021-10-20 DIAGNOSIS — R2681 Unsteadiness on feet: Secondary | ICD-10-CM | POA: Diagnosis not present

## 2021-10-20 DIAGNOSIS — N139 Obstructive and reflux uropathy, unspecified: Secondary | ICD-10-CM | POA: Diagnosis not present

## 2021-10-23 DIAGNOSIS — R2681 Unsteadiness on feet: Secondary | ICD-10-CM | POA: Diagnosis not present

## 2021-10-23 DIAGNOSIS — I130 Hypertensive heart and chronic kidney disease with heart failure and stage 1 through stage 4 chronic kidney disease, or unspecified chronic kidney disease: Secondary | ICD-10-CM | POA: Diagnosis not present

## 2021-10-23 DIAGNOSIS — G8929 Other chronic pain: Secondary | ICD-10-CM | POA: Diagnosis not present

## 2021-10-23 DIAGNOSIS — M6281 Muscle weakness (generalized): Secondary | ICD-10-CM | POA: Diagnosis not present

## 2021-10-23 DIAGNOSIS — N139 Obstructive and reflux uropathy, unspecified: Secondary | ICD-10-CM | POA: Diagnosis not present

## 2021-10-24 DIAGNOSIS — R627 Adult failure to thrive: Secondary | ICD-10-CM | POA: Diagnosis not present

## 2021-10-24 DIAGNOSIS — R2681 Unsteadiness on feet: Secondary | ICD-10-CM | POA: Diagnosis not present

## 2021-10-24 DIAGNOSIS — I130 Hypertensive heart and chronic kidney disease with heart failure and stage 1 through stage 4 chronic kidney disease, or unspecified chronic kidney disease: Secondary | ICD-10-CM | POA: Diagnosis not present

## 2021-10-24 DIAGNOSIS — E1165 Type 2 diabetes mellitus with hyperglycemia: Secondary | ICD-10-CM | POA: Diagnosis not present

## 2021-10-24 DIAGNOSIS — I480 Paroxysmal atrial fibrillation: Secondary | ICD-10-CM | POA: Diagnosis not present

## 2021-10-24 DIAGNOSIS — G8929 Other chronic pain: Secondary | ICD-10-CM | POA: Diagnosis not present

## 2021-10-24 DIAGNOSIS — N139 Obstructive and reflux uropathy, unspecified: Secondary | ICD-10-CM | POA: Diagnosis not present

## 2021-10-24 DIAGNOSIS — I509 Heart failure, unspecified: Secondary | ICD-10-CM | POA: Diagnosis not present

## 2021-10-24 DIAGNOSIS — M6281 Muscle weakness (generalized): Secondary | ICD-10-CM | POA: Diagnosis not present

## 2021-10-25 DIAGNOSIS — G8929 Other chronic pain: Secondary | ICD-10-CM | POA: Diagnosis not present

## 2021-10-25 DIAGNOSIS — N139 Obstructive and reflux uropathy, unspecified: Secondary | ICD-10-CM | POA: Diagnosis not present

## 2021-10-25 DIAGNOSIS — R2681 Unsteadiness on feet: Secondary | ICD-10-CM | POA: Diagnosis not present

## 2021-10-25 DIAGNOSIS — M6281 Muscle weakness (generalized): Secondary | ICD-10-CM | POA: Diagnosis not present

## 2021-10-25 DIAGNOSIS — I130 Hypertensive heart and chronic kidney disease with heart failure and stage 1 through stage 4 chronic kidney disease, or unspecified chronic kidney disease: Secondary | ICD-10-CM | POA: Diagnosis not present

## 2021-10-26 DIAGNOSIS — R2681 Unsteadiness on feet: Secondary | ICD-10-CM | POA: Diagnosis not present

## 2021-10-26 DIAGNOSIS — G8929 Other chronic pain: Secondary | ICD-10-CM | POA: Diagnosis not present

## 2021-10-26 DIAGNOSIS — N139 Obstructive and reflux uropathy, unspecified: Secondary | ICD-10-CM | POA: Diagnosis not present

## 2021-10-26 DIAGNOSIS — M6281 Muscle weakness (generalized): Secondary | ICD-10-CM | POA: Diagnosis not present

## 2021-10-26 DIAGNOSIS — I130 Hypertensive heart and chronic kidney disease with heart failure and stage 1 through stage 4 chronic kidney disease, or unspecified chronic kidney disease: Secondary | ICD-10-CM | POA: Diagnosis not present

## 2021-10-27 DIAGNOSIS — M6281 Muscle weakness (generalized): Secondary | ICD-10-CM | POA: Diagnosis not present

## 2021-10-27 DIAGNOSIS — N139 Obstructive and reflux uropathy, unspecified: Secondary | ICD-10-CM | POA: Diagnosis not present

## 2021-10-27 DIAGNOSIS — G8929 Other chronic pain: Secondary | ICD-10-CM | POA: Diagnosis not present

## 2021-10-27 DIAGNOSIS — R2681 Unsteadiness on feet: Secondary | ICD-10-CM | POA: Diagnosis not present

## 2021-10-27 DIAGNOSIS — I130 Hypertensive heart and chronic kidney disease with heart failure and stage 1 through stage 4 chronic kidney disease, or unspecified chronic kidney disease: Secondary | ICD-10-CM | POA: Diagnosis not present

## 2021-10-30 DIAGNOSIS — Z79899 Other long term (current) drug therapy: Secondary | ICD-10-CM | POA: Diagnosis not present

## 2021-11-06 DIAGNOSIS — D508 Other iron deficiency anemias: Secondary | ICD-10-CM | POA: Diagnosis not present

## 2021-11-06 DIAGNOSIS — E1165 Type 2 diabetes mellitus with hyperglycemia: Secondary | ICD-10-CM | POA: Diagnosis not present

## 2021-11-06 DIAGNOSIS — I4891 Unspecified atrial fibrillation: Secondary | ICD-10-CM | POA: Diagnosis not present

## 2021-11-06 DIAGNOSIS — I480 Paroxysmal atrial fibrillation: Secondary | ICD-10-CM | POA: Diagnosis not present

## 2021-11-06 DIAGNOSIS — E119 Type 2 diabetes mellitus without complications: Secondary | ICD-10-CM | POA: Diagnosis not present

## 2021-11-06 DIAGNOSIS — N189 Chronic kidney disease, unspecified: Secondary | ICD-10-CM | POA: Diagnosis not present

## 2021-11-06 DIAGNOSIS — I509 Heart failure, unspecified: Secondary | ICD-10-CM | POA: Diagnosis not present

## 2021-11-06 DIAGNOSIS — K219 Gastro-esophageal reflux disease without esophagitis: Secondary | ICD-10-CM | POA: Diagnosis not present

## 2021-11-21 DIAGNOSIS — N189 Chronic kidney disease, unspecified: Secondary | ICD-10-CM | POA: Diagnosis not present

## 2021-11-21 DIAGNOSIS — R627 Adult failure to thrive: Secondary | ICD-10-CM | POA: Diagnosis not present

## 2021-11-21 DIAGNOSIS — I480 Paroxysmal atrial fibrillation: Secondary | ICD-10-CM | POA: Diagnosis not present

## 2021-11-21 DIAGNOSIS — I509 Heart failure, unspecified: Secondary | ICD-10-CM | POA: Diagnosis not present

## 2021-12-01 DIAGNOSIS — E1165 Type 2 diabetes mellitus with hyperglycemia: Secondary | ICD-10-CM | POA: Diagnosis not present

## 2021-12-01 DIAGNOSIS — D508 Other iron deficiency anemias: Secondary | ICD-10-CM | POA: Diagnosis not present

## 2021-12-01 DIAGNOSIS — I4891 Unspecified atrial fibrillation: Secondary | ICD-10-CM | POA: Diagnosis not present

## 2021-12-01 DIAGNOSIS — N189 Chronic kidney disease, unspecified: Secondary | ICD-10-CM | POA: Diagnosis not present

## 2021-12-01 DIAGNOSIS — I480 Paroxysmal atrial fibrillation: Secondary | ICD-10-CM | POA: Diagnosis not present

## 2021-12-01 DIAGNOSIS — I509 Heart failure, unspecified: Secondary | ICD-10-CM | POA: Diagnosis not present

## 2021-12-01 DIAGNOSIS — E119 Type 2 diabetes mellitus without complications: Secondary | ICD-10-CM | POA: Diagnosis not present

## 2021-12-04 DIAGNOSIS — E119 Type 2 diabetes mellitus without complications: Secondary | ICD-10-CM | POA: Diagnosis not present

## 2021-12-04 DIAGNOSIS — I509 Heart failure, unspecified: Secondary | ICD-10-CM | POA: Diagnosis not present

## 2021-12-04 DIAGNOSIS — I480 Paroxysmal atrial fibrillation: Secondary | ICD-10-CM | POA: Diagnosis not present

## 2021-12-04 DIAGNOSIS — D508 Other iron deficiency anemias: Secondary | ICD-10-CM | POA: Diagnosis not present

## 2021-12-12 DIAGNOSIS — R339 Retention of urine, unspecified: Secondary | ICD-10-CM | POA: Diagnosis not present

## 2021-12-12 DIAGNOSIS — R300 Dysuria: Secondary | ICD-10-CM | POA: Diagnosis not present

## 2021-12-12 DIAGNOSIS — N138 Other obstructive and reflux uropathy: Secondary | ICD-10-CM | POA: Diagnosis not present

## 2021-12-14 DIAGNOSIS — E119 Type 2 diabetes mellitus without complications: Secondary | ICD-10-CM | POA: Diagnosis not present

## 2021-12-14 DIAGNOSIS — H401134 Primary open-angle glaucoma, bilateral, indeterminate stage: Secondary | ICD-10-CM | POA: Diagnosis not present

## 2021-12-19 DIAGNOSIS — G309 Alzheimer's disease, unspecified: Secondary | ICD-10-CM | POA: Diagnosis not present

## 2021-12-19 DIAGNOSIS — I509 Heart failure, unspecified: Secondary | ICD-10-CM | POA: Diagnosis not present

## 2021-12-19 DIAGNOSIS — E1165 Type 2 diabetes mellitus with hyperglycemia: Secondary | ICD-10-CM | POA: Diagnosis not present

## 2021-12-19 DIAGNOSIS — R627 Adult failure to thrive: Secondary | ICD-10-CM | POA: Diagnosis not present

## 2021-12-19 DIAGNOSIS — I4891 Unspecified atrial fibrillation: Secondary | ICD-10-CM | POA: Diagnosis not present

## 2021-12-26 DIAGNOSIS — N138 Other obstructive and reflux uropathy: Secondary | ICD-10-CM | POA: Diagnosis not present

## 2021-12-27 DIAGNOSIS — H903 Sensorineural hearing loss, bilateral: Secondary | ICD-10-CM | POA: Diagnosis not present

## 2022-01-01 DIAGNOSIS — I4891 Unspecified atrial fibrillation: Secondary | ICD-10-CM | POA: Diagnosis not present

## 2022-01-01 DIAGNOSIS — K219 Gastro-esophageal reflux disease without esophagitis: Secondary | ICD-10-CM | POA: Diagnosis not present

## 2022-01-01 DIAGNOSIS — G309 Alzheimer's disease, unspecified: Secondary | ICD-10-CM | POA: Diagnosis not present

## 2022-01-01 DIAGNOSIS — D508 Other iron deficiency anemias: Secondary | ICD-10-CM | POA: Diagnosis not present

## 2022-01-01 DIAGNOSIS — N189 Chronic kidney disease, unspecified: Secondary | ICD-10-CM | POA: Diagnosis not present

## 2022-01-02 DIAGNOSIS — B353 Tinea pedis: Secondary | ICD-10-CM | POA: Diagnosis not present

## 2022-01-02 DIAGNOSIS — B351 Tinea unguium: Secondary | ICD-10-CM | POA: Diagnosis not present

## 2022-01-02 DIAGNOSIS — E1159 Type 2 diabetes mellitus with other circulatory complications: Secondary | ICD-10-CM | POA: Diagnosis not present

## 2022-01-08 DIAGNOSIS — I509 Heart failure, unspecified: Secondary | ICD-10-CM | POA: Diagnosis not present

## 2022-01-08 DIAGNOSIS — D508 Other iron deficiency anemias: Secondary | ICD-10-CM | POA: Diagnosis not present

## 2022-01-08 DIAGNOSIS — I480 Paroxysmal atrial fibrillation: Secondary | ICD-10-CM | POA: Diagnosis not present

## 2022-01-08 DIAGNOSIS — N189 Chronic kidney disease, unspecified: Secondary | ICD-10-CM | POA: Diagnosis not present

## 2022-01-08 DIAGNOSIS — G309 Alzheimer's disease, unspecified: Secondary | ICD-10-CM | POA: Diagnosis not present

## 2022-01-08 DIAGNOSIS — E1165 Type 2 diabetes mellitus with hyperglycemia: Secondary | ICD-10-CM | POA: Diagnosis not present

## 2022-01-08 DIAGNOSIS — I4891 Unspecified atrial fibrillation: Secondary | ICD-10-CM | POA: Diagnosis not present

## 2022-01-08 DIAGNOSIS — E119 Type 2 diabetes mellitus without complications: Secondary | ICD-10-CM | POA: Diagnosis not present

## 2022-01-16 DIAGNOSIS — I509 Heart failure, unspecified: Secondary | ICD-10-CM | POA: Diagnosis not present

## 2022-01-16 DIAGNOSIS — I4891 Unspecified atrial fibrillation: Secondary | ICD-10-CM | POA: Diagnosis not present

## 2022-01-16 DIAGNOSIS — K219 Gastro-esophageal reflux disease without esophagitis: Secondary | ICD-10-CM | POA: Diagnosis not present

## 2022-01-16 DIAGNOSIS — E1165 Type 2 diabetes mellitus with hyperglycemia: Secondary | ICD-10-CM | POA: Diagnosis not present

## 2022-01-18 DIAGNOSIS — R1310 Dysphagia, unspecified: Secondary | ICD-10-CM | POA: Diagnosis not present

## 2022-01-18 DIAGNOSIS — I130 Hypertensive heart and chronic kidney disease with heart failure and stage 1 through stage 4 chronic kidney disease, or unspecified chronic kidney disease: Secondary | ICD-10-CM | POA: Diagnosis not present

## 2022-01-18 DIAGNOSIS — M6281 Muscle weakness (generalized): Secondary | ICD-10-CM | POA: Diagnosis not present

## 2022-01-18 DIAGNOSIS — R627 Adult failure to thrive: Secondary | ICD-10-CM | POA: Diagnosis not present

## 2022-01-18 DIAGNOSIS — N139 Obstructive and reflux uropathy, unspecified: Secondary | ICD-10-CM | POA: Diagnosis not present

## 2022-01-18 DIAGNOSIS — R2681 Unsteadiness on feet: Secondary | ICD-10-CM | POA: Diagnosis not present

## 2022-01-19 DIAGNOSIS — I130 Hypertensive heart and chronic kidney disease with heart failure and stage 1 through stage 4 chronic kidney disease, or unspecified chronic kidney disease: Secondary | ICD-10-CM | POA: Diagnosis not present

## 2022-01-19 DIAGNOSIS — M6281 Muscle weakness (generalized): Secondary | ICD-10-CM | POA: Diagnosis not present

## 2022-01-19 DIAGNOSIS — R1310 Dysphagia, unspecified: Secondary | ICD-10-CM | POA: Diagnosis not present

## 2022-01-19 DIAGNOSIS — N139 Obstructive and reflux uropathy, unspecified: Secondary | ICD-10-CM | POA: Diagnosis not present

## 2022-01-19 DIAGNOSIS — R2681 Unsteadiness on feet: Secondary | ICD-10-CM | POA: Diagnosis not present

## 2022-01-19 DIAGNOSIS — R627 Adult failure to thrive: Secondary | ICD-10-CM | POA: Diagnosis not present

## 2022-01-22 DIAGNOSIS — R1310 Dysphagia, unspecified: Secondary | ICD-10-CM | POA: Diagnosis not present

## 2022-01-22 DIAGNOSIS — I130 Hypertensive heart and chronic kidney disease with heart failure and stage 1 through stage 4 chronic kidney disease, or unspecified chronic kidney disease: Secondary | ICD-10-CM | POA: Diagnosis not present

## 2022-01-22 DIAGNOSIS — M6281 Muscle weakness (generalized): Secondary | ICD-10-CM | POA: Diagnosis not present

## 2022-01-22 DIAGNOSIS — N139 Obstructive and reflux uropathy, unspecified: Secondary | ICD-10-CM | POA: Diagnosis not present

## 2022-01-22 DIAGNOSIS — R2681 Unsteadiness on feet: Secondary | ICD-10-CM | POA: Diagnosis not present

## 2022-01-22 DIAGNOSIS — R627 Adult failure to thrive: Secondary | ICD-10-CM | POA: Diagnosis not present

## 2022-01-23 DIAGNOSIS — R627 Adult failure to thrive: Secondary | ICD-10-CM | POA: Diagnosis not present

## 2022-01-23 DIAGNOSIS — R1310 Dysphagia, unspecified: Secondary | ICD-10-CM | POA: Diagnosis not present

## 2022-01-23 DIAGNOSIS — I130 Hypertensive heart and chronic kidney disease with heart failure and stage 1 through stage 4 chronic kidney disease, or unspecified chronic kidney disease: Secondary | ICD-10-CM | POA: Diagnosis not present

## 2022-01-23 DIAGNOSIS — M6281 Muscle weakness (generalized): Secondary | ICD-10-CM | POA: Diagnosis not present

## 2022-01-23 DIAGNOSIS — N139 Obstructive and reflux uropathy, unspecified: Secondary | ICD-10-CM | POA: Diagnosis not present

## 2022-01-23 DIAGNOSIS — R2681 Unsteadiness on feet: Secondary | ICD-10-CM | POA: Diagnosis not present

## 2022-01-24 DIAGNOSIS — I130 Hypertensive heart and chronic kidney disease with heart failure and stage 1 through stage 4 chronic kidney disease, or unspecified chronic kidney disease: Secondary | ICD-10-CM | POA: Diagnosis not present

## 2022-01-24 DIAGNOSIS — M6281 Muscle weakness (generalized): Secondary | ICD-10-CM | POA: Diagnosis not present

## 2022-01-24 DIAGNOSIS — N139 Obstructive and reflux uropathy, unspecified: Secondary | ICD-10-CM | POA: Diagnosis not present

## 2022-01-24 DIAGNOSIS — R2681 Unsteadiness on feet: Secondary | ICD-10-CM | POA: Diagnosis not present

## 2022-01-24 DIAGNOSIS — R627 Adult failure to thrive: Secondary | ICD-10-CM | POA: Diagnosis not present

## 2022-01-24 DIAGNOSIS — R1310 Dysphagia, unspecified: Secondary | ICD-10-CM | POA: Diagnosis not present

## 2022-01-25 DIAGNOSIS — I130 Hypertensive heart and chronic kidney disease with heart failure and stage 1 through stage 4 chronic kidney disease, or unspecified chronic kidney disease: Secondary | ICD-10-CM | POA: Diagnosis not present

## 2022-01-25 DIAGNOSIS — N139 Obstructive and reflux uropathy, unspecified: Secondary | ICD-10-CM | POA: Diagnosis not present

## 2022-01-25 DIAGNOSIS — R1310 Dysphagia, unspecified: Secondary | ICD-10-CM | POA: Diagnosis not present

## 2022-01-25 DIAGNOSIS — M6281 Muscle weakness (generalized): Secondary | ICD-10-CM | POA: Diagnosis not present

## 2022-01-25 DIAGNOSIS — R627 Adult failure to thrive: Secondary | ICD-10-CM | POA: Diagnosis not present

## 2022-01-25 DIAGNOSIS — R2681 Unsteadiness on feet: Secondary | ICD-10-CM | POA: Diagnosis not present

## 2022-01-26 DIAGNOSIS — M6281 Muscle weakness (generalized): Secondary | ICD-10-CM | POA: Diagnosis not present

## 2022-01-26 DIAGNOSIS — R2681 Unsteadiness on feet: Secondary | ICD-10-CM | POA: Diagnosis not present

## 2022-01-26 DIAGNOSIS — N139 Obstructive and reflux uropathy, unspecified: Secondary | ICD-10-CM | POA: Diagnosis not present

## 2022-01-26 DIAGNOSIS — I130 Hypertensive heart and chronic kidney disease with heart failure and stage 1 through stage 4 chronic kidney disease, or unspecified chronic kidney disease: Secondary | ICD-10-CM | POA: Diagnosis not present

## 2022-01-26 DIAGNOSIS — R1310 Dysphagia, unspecified: Secondary | ICD-10-CM | POA: Diagnosis not present

## 2022-01-26 DIAGNOSIS — R627 Adult failure to thrive: Secondary | ICD-10-CM | POA: Diagnosis not present

## 2022-01-29 DIAGNOSIS — I509 Heart failure, unspecified: Secondary | ICD-10-CM | POA: Diagnosis not present

## 2022-01-29 DIAGNOSIS — E119 Type 2 diabetes mellitus without complications: Secondary | ICD-10-CM | POA: Diagnosis not present

## 2022-01-29 DIAGNOSIS — I4891 Unspecified atrial fibrillation: Secondary | ICD-10-CM | POA: Diagnosis not present

## 2022-01-29 DIAGNOSIS — M6281 Muscle weakness (generalized): Secondary | ICD-10-CM | POA: Diagnosis not present

## 2022-01-29 DIAGNOSIS — R627 Adult failure to thrive: Secondary | ICD-10-CM | POA: Diagnosis not present

## 2022-01-29 DIAGNOSIS — R2681 Unsteadiness on feet: Secondary | ICD-10-CM | POA: Diagnosis not present

## 2022-01-29 DIAGNOSIS — R1310 Dysphagia, unspecified: Secondary | ICD-10-CM | POA: Diagnosis not present

## 2022-01-29 DIAGNOSIS — I130 Hypertensive heart and chronic kidney disease with heart failure and stage 1 through stage 4 chronic kidney disease, or unspecified chronic kidney disease: Secondary | ICD-10-CM | POA: Diagnosis not present

## 2022-01-29 DIAGNOSIS — N189 Chronic kidney disease, unspecified: Secondary | ICD-10-CM | POA: Diagnosis not present

## 2022-01-29 DIAGNOSIS — N139 Obstructive and reflux uropathy, unspecified: Secondary | ICD-10-CM | POA: Diagnosis not present

## 2022-01-29 DIAGNOSIS — K219 Gastro-esophageal reflux disease without esophagitis: Secondary | ICD-10-CM | POA: Diagnosis not present

## 2022-01-30 DIAGNOSIS — N139 Obstructive and reflux uropathy, unspecified: Secondary | ICD-10-CM | POA: Diagnosis not present

## 2022-01-30 DIAGNOSIS — I130 Hypertensive heart and chronic kidney disease with heart failure and stage 1 through stage 4 chronic kidney disease, or unspecified chronic kidney disease: Secondary | ICD-10-CM | POA: Diagnosis not present

## 2022-01-30 DIAGNOSIS — M6281 Muscle weakness (generalized): Secondary | ICD-10-CM | POA: Diagnosis not present

## 2022-01-30 DIAGNOSIS — R1310 Dysphagia, unspecified: Secondary | ICD-10-CM | POA: Diagnosis not present

## 2022-01-30 DIAGNOSIS — R2681 Unsteadiness on feet: Secondary | ICD-10-CM | POA: Diagnosis not present

## 2022-01-30 DIAGNOSIS — R627 Adult failure to thrive: Secondary | ICD-10-CM | POA: Diagnosis not present

## 2022-01-31 DIAGNOSIS — R627 Adult failure to thrive: Secondary | ICD-10-CM | POA: Diagnosis not present

## 2022-01-31 DIAGNOSIS — R2681 Unsteadiness on feet: Secondary | ICD-10-CM | POA: Diagnosis not present

## 2022-01-31 DIAGNOSIS — M6281 Muscle weakness (generalized): Secondary | ICD-10-CM | POA: Diagnosis not present

## 2022-01-31 DIAGNOSIS — R1310 Dysphagia, unspecified: Secondary | ICD-10-CM | POA: Diagnosis not present

## 2022-01-31 DIAGNOSIS — I130 Hypertensive heart and chronic kidney disease with heart failure and stage 1 through stage 4 chronic kidney disease, or unspecified chronic kidney disease: Secondary | ICD-10-CM | POA: Diagnosis not present

## 2022-01-31 DIAGNOSIS — N139 Obstructive and reflux uropathy, unspecified: Secondary | ICD-10-CM | POA: Diagnosis not present

## 2022-02-01 DIAGNOSIS — R627 Adult failure to thrive: Secondary | ICD-10-CM | POA: Diagnosis not present

## 2022-02-01 DIAGNOSIS — I130 Hypertensive heart and chronic kidney disease with heart failure and stage 1 through stage 4 chronic kidney disease, or unspecified chronic kidney disease: Secondary | ICD-10-CM | POA: Diagnosis not present

## 2022-02-01 DIAGNOSIS — R1310 Dysphagia, unspecified: Secondary | ICD-10-CM | POA: Diagnosis not present

## 2022-02-01 DIAGNOSIS — N139 Obstructive and reflux uropathy, unspecified: Secondary | ICD-10-CM | POA: Diagnosis not present

## 2022-02-01 DIAGNOSIS — R2681 Unsteadiness on feet: Secondary | ICD-10-CM | POA: Diagnosis not present

## 2022-02-01 DIAGNOSIS — M6281 Muscle weakness (generalized): Secondary | ICD-10-CM | POA: Diagnosis not present

## 2022-02-06 DIAGNOSIS — E119 Type 2 diabetes mellitus without complications: Secondary | ICD-10-CM | POA: Diagnosis not present

## 2022-02-06 DIAGNOSIS — I480 Paroxysmal atrial fibrillation: Secondary | ICD-10-CM | POA: Diagnosis not present

## 2022-02-06 DIAGNOSIS — I4891 Unspecified atrial fibrillation: Secondary | ICD-10-CM | POA: Diagnosis not present

## 2022-02-06 DIAGNOSIS — N189 Chronic kidney disease, unspecified: Secondary | ICD-10-CM | POA: Diagnosis not present

## 2022-02-06 DIAGNOSIS — G309 Alzheimer's disease, unspecified: Secondary | ICD-10-CM | POA: Diagnosis not present

## 2022-02-06 DIAGNOSIS — D508 Other iron deficiency anemias: Secondary | ICD-10-CM | POA: Diagnosis not present

## 2022-02-06 DIAGNOSIS — I509 Heart failure, unspecified: Secondary | ICD-10-CM | POA: Diagnosis not present

## 2022-02-06 DIAGNOSIS — E1165 Type 2 diabetes mellitus with hyperglycemia: Secondary | ICD-10-CM | POA: Diagnosis not present

## 2022-02-13 DIAGNOSIS — R627 Adult failure to thrive: Secondary | ICD-10-CM | POA: Diagnosis not present

## 2022-02-13 DIAGNOSIS — N189 Chronic kidney disease, unspecified: Secondary | ICD-10-CM | POA: Diagnosis not present

## 2022-02-13 DIAGNOSIS — I4891 Unspecified atrial fibrillation: Secondary | ICD-10-CM | POA: Diagnosis not present

## 2022-02-13 DIAGNOSIS — G309 Alzheimer's disease, unspecified: Secondary | ICD-10-CM | POA: Diagnosis not present

## 2022-02-13 DIAGNOSIS — I509 Heart failure, unspecified: Secondary | ICD-10-CM | POA: Diagnosis not present

## 2022-02-13 DIAGNOSIS — N139 Obstructive and reflux uropathy, unspecified: Secondary | ICD-10-CM | POA: Diagnosis not present

## 2022-02-28 DIAGNOSIS — E1165 Type 2 diabetes mellitus with hyperglycemia: Secondary | ICD-10-CM | POA: Diagnosis not present

## 2022-02-28 DIAGNOSIS — G309 Alzheimer's disease, unspecified: Secondary | ICD-10-CM | POA: Diagnosis not present

## 2022-03-04 DIAGNOSIS — R296 Repeated falls: Secondary | ICD-10-CM | POA: Diagnosis not present

## 2022-03-05 DIAGNOSIS — N189 Chronic kidney disease, unspecified: Secondary | ICD-10-CM | POA: Diagnosis not present

## 2022-03-05 DIAGNOSIS — W19XXXA Unspecified fall, initial encounter: Secondary | ICD-10-CM | POA: Diagnosis not present

## 2022-03-05 DIAGNOSIS — I4891 Unspecified atrial fibrillation: Secondary | ICD-10-CM | POA: Diagnosis not present

## 2022-03-05 DIAGNOSIS — M79652 Pain in left thigh: Secondary | ICD-10-CM | POA: Diagnosis not present

## 2022-03-05 DIAGNOSIS — I509 Heart failure, unspecified: Secondary | ICD-10-CM | POA: Diagnosis not present

## 2022-03-05 DIAGNOSIS — N139 Obstructive and reflux uropathy, unspecified: Secondary | ICD-10-CM | POA: Diagnosis not present

## 2022-03-05 DIAGNOSIS — M25552 Pain in left hip: Secondary | ICD-10-CM | POA: Diagnosis not present

## 2022-03-05 DIAGNOSIS — R627 Adult failure to thrive: Secondary | ICD-10-CM | POA: Diagnosis not present

## 2022-03-06 DIAGNOSIS — R2681 Unsteadiness on feet: Secondary | ICD-10-CM | POA: Diagnosis not present

## 2022-03-06 DIAGNOSIS — N139 Obstructive and reflux uropathy, unspecified: Secondary | ICD-10-CM | POA: Diagnosis not present

## 2022-03-06 DIAGNOSIS — I130 Hypertensive heart and chronic kidney disease with heart failure and stage 1 through stage 4 chronic kidney disease, or unspecified chronic kidney disease: Secondary | ICD-10-CM | POA: Diagnosis not present

## 2022-03-06 DIAGNOSIS — M6281 Muscle weakness (generalized): Secondary | ICD-10-CM | POA: Diagnosis not present

## 2022-03-07 DIAGNOSIS — N139 Obstructive and reflux uropathy, unspecified: Secondary | ICD-10-CM | POA: Diagnosis not present

## 2022-03-07 DIAGNOSIS — I130 Hypertensive heart and chronic kidney disease with heart failure and stage 1 through stage 4 chronic kidney disease, or unspecified chronic kidney disease: Secondary | ICD-10-CM | POA: Diagnosis not present

## 2022-03-07 DIAGNOSIS — M6281 Muscle weakness (generalized): Secondary | ICD-10-CM | POA: Diagnosis not present

## 2022-03-07 DIAGNOSIS — R2681 Unsteadiness on feet: Secondary | ICD-10-CM | POA: Diagnosis not present

## 2022-03-08 DIAGNOSIS — R2681 Unsteadiness on feet: Secondary | ICD-10-CM | POA: Diagnosis not present

## 2022-03-08 DIAGNOSIS — N139 Obstructive and reflux uropathy, unspecified: Secondary | ICD-10-CM | POA: Diagnosis not present

## 2022-03-08 DIAGNOSIS — I130 Hypertensive heart and chronic kidney disease with heart failure and stage 1 through stage 4 chronic kidney disease, or unspecified chronic kidney disease: Secondary | ICD-10-CM | POA: Diagnosis not present

## 2022-03-08 DIAGNOSIS — M6281 Muscle weakness (generalized): Secondary | ICD-10-CM | POA: Diagnosis not present

## 2022-03-08 DIAGNOSIS — E1159 Type 2 diabetes mellitus with other circulatory complications: Secondary | ICD-10-CM | POA: Diagnosis not present

## 2022-03-08 DIAGNOSIS — B351 Tinea unguium: Secondary | ICD-10-CM | POA: Diagnosis not present

## 2022-03-09 DIAGNOSIS — I130 Hypertensive heart and chronic kidney disease with heart failure and stage 1 through stage 4 chronic kidney disease, or unspecified chronic kidney disease: Secondary | ICD-10-CM | POA: Diagnosis not present

## 2022-03-09 DIAGNOSIS — M6281 Muscle weakness (generalized): Secondary | ICD-10-CM | POA: Diagnosis not present

## 2022-03-09 DIAGNOSIS — R2681 Unsteadiness on feet: Secondary | ICD-10-CM | POA: Diagnosis not present

## 2022-03-09 DIAGNOSIS — N139 Obstructive and reflux uropathy, unspecified: Secondary | ICD-10-CM | POA: Diagnosis not present

## 2022-03-12 DIAGNOSIS — I130 Hypertensive heart and chronic kidney disease with heart failure and stage 1 through stage 4 chronic kidney disease, or unspecified chronic kidney disease: Secondary | ICD-10-CM | POA: Diagnosis not present

## 2022-03-12 DIAGNOSIS — M6281 Muscle weakness (generalized): Secondary | ICD-10-CM | POA: Diagnosis not present

## 2022-03-12 DIAGNOSIS — N139 Obstructive and reflux uropathy, unspecified: Secondary | ICD-10-CM | POA: Diagnosis not present

## 2022-03-12 DIAGNOSIS — R2681 Unsteadiness on feet: Secondary | ICD-10-CM | POA: Diagnosis not present

## 2022-03-13 DIAGNOSIS — G309 Alzheimer's disease, unspecified: Secondary | ICD-10-CM | POA: Diagnosis not present

## 2022-03-13 DIAGNOSIS — I130 Hypertensive heart and chronic kidney disease with heart failure and stage 1 through stage 4 chronic kidney disease, or unspecified chronic kidney disease: Secondary | ICD-10-CM | POA: Diagnosis not present

## 2022-03-13 DIAGNOSIS — I4891 Unspecified atrial fibrillation: Secondary | ICD-10-CM | POA: Diagnosis not present

## 2022-03-13 DIAGNOSIS — I509 Heart failure, unspecified: Secondary | ICD-10-CM | POA: Diagnosis not present

## 2022-03-13 DIAGNOSIS — M6281 Muscle weakness (generalized): Secondary | ICD-10-CM | POA: Diagnosis not present

## 2022-03-13 DIAGNOSIS — R2681 Unsteadiness on feet: Secondary | ICD-10-CM | POA: Diagnosis not present

## 2022-03-13 DIAGNOSIS — N189 Chronic kidney disease, unspecified: Secondary | ICD-10-CM | POA: Diagnosis not present

## 2022-03-13 DIAGNOSIS — E1165 Type 2 diabetes mellitus with hyperglycemia: Secondary | ICD-10-CM | POA: Diagnosis not present

## 2022-03-13 DIAGNOSIS — N139 Obstructive and reflux uropathy, unspecified: Secondary | ICD-10-CM | POA: Diagnosis not present

## 2022-03-14 DIAGNOSIS — I509 Heart failure, unspecified: Secondary | ICD-10-CM | POA: Diagnosis not present

## 2022-03-14 DIAGNOSIS — D508 Other iron deficiency anemias: Secondary | ICD-10-CM | POA: Diagnosis not present

## 2022-03-14 DIAGNOSIS — I130 Hypertensive heart and chronic kidney disease with heart failure and stage 1 through stage 4 chronic kidney disease, or unspecified chronic kidney disease: Secondary | ICD-10-CM | POA: Diagnosis not present

## 2022-03-14 DIAGNOSIS — M6281 Muscle weakness (generalized): Secondary | ICD-10-CM | POA: Diagnosis not present

## 2022-03-14 DIAGNOSIS — E1165 Type 2 diabetes mellitus with hyperglycemia: Secondary | ICD-10-CM | POA: Diagnosis not present

## 2022-03-14 DIAGNOSIS — N189 Chronic kidney disease, unspecified: Secondary | ICD-10-CM | POA: Diagnosis not present

## 2022-03-14 DIAGNOSIS — R2681 Unsteadiness on feet: Secondary | ICD-10-CM | POA: Diagnosis not present

## 2022-03-14 DIAGNOSIS — I4891 Unspecified atrial fibrillation: Secondary | ICD-10-CM | POA: Diagnosis not present

## 2022-03-14 DIAGNOSIS — I480 Paroxysmal atrial fibrillation: Secondary | ICD-10-CM | POA: Diagnosis not present

## 2022-03-14 DIAGNOSIS — E119 Type 2 diabetes mellitus without complications: Secondary | ICD-10-CM | POA: Diagnosis not present

## 2022-03-14 DIAGNOSIS — G309 Alzheimer's disease, unspecified: Secondary | ICD-10-CM | POA: Diagnosis not present

## 2022-03-14 DIAGNOSIS — N139 Obstructive and reflux uropathy, unspecified: Secondary | ICD-10-CM | POA: Diagnosis not present

## 2022-03-15 DIAGNOSIS — M6281 Muscle weakness (generalized): Secondary | ICD-10-CM | POA: Diagnosis not present

## 2022-03-15 DIAGNOSIS — I130 Hypertensive heart and chronic kidney disease with heart failure and stage 1 through stage 4 chronic kidney disease, or unspecified chronic kidney disease: Secondary | ICD-10-CM | POA: Diagnosis not present

## 2022-03-15 DIAGNOSIS — N139 Obstructive and reflux uropathy, unspecified: Secondary | ICD-10-CM | POA: Diagnosis not present

## 2022-03-15 DIAGNOSIS — R2681 Unsteadiness on feet: Secondary | ICD-10-CM | POA: Diagnosis not present

## 2022-03-16 DIAGNOSIS — M6281 Muscle weakness (generalized): Secondary | ICD-10-CM | POA: Diagnosis not present

## 2022-03-16 DIAGNOSIS — N139 Obstructive and reflux uropathy, unspecified: Secondary | ICD-10-CM | POA: Diagnosis not present

## 2022-03-16 DIAGNOSIS — R2681 Unsteadiness on feet: Secondary | ICD-10-CM | POA: Diagnosis not present

## 2022-03-16 DIAGNOSIS — I130 Hypertensive heart and chronic kidney disease with heart failure and stage 1 through stage 4 chronic kidney disease, or unspecified chronic kidney disease: Secondary | ICD-10-CM | POA: Diagnosis not present

## 2022-03-19 DIAGNOSIS — R2681 Unsteadiness on feet: Secondary | ICD-10-CM | POA: Diagnosis not present

## 2022-03-19 DIAGNOSIS — N139 Obstructive and reflux uropathy, unspecified: Secondary | ICD-10-CM | POA: Diagnosis not present

## 2022-03-19 DIAGNOSIS — M6281 Muscle weakness (generalized): Secondary | ICD-10-CM | POA: Diagnosis not present

## 2022-03-19 DIAGNOSIS — I130 Hypertensive heart and chronic kidney disease with heart failure and stage 1 through stage 4 chronic kidney disease, or unspecified chronic kidney disease: Secondary | ICD-10-CM | POA: Diagnosis not present

## 2022-03-20 DIAGNOSIS — R296 Repeated falls: Secondary | ICD-10-CM | POA: Diagnosis not present

## 2022-03-20 DIAGNOSIS — N139 Obstructive and reflux uropathy, unspecified: Secondary | ICD-10-CM | POA: Diagnosis not present

## 2022-03-20 DIAGNOSIS — I130 Hypertensive heart and chronic kidney disease with heart failure and stage 1 through stage 4 chronic kidney disease, or unspecified chronic kidney disease: Secondary | ICD-10-CM | POA: Diagnosis not present

## 2022-03-20 DIAGNOSIS — M6281 Muscle weakness (generalized): Secondary | ICD-10-CM | POA: Diagnosis not present

## 2022-03-20 DIAGNOSIS — R2681 Unsteadiness on feet: Secondary | ICD-10-CM | POA: Diagnosis not present

## 2022-03-21 DIAGNOSIS — I130 Hypertensive heart and chronic kidney disease with heart failure and stage 1 through stage 4 chronic kidney disease, or unspecified chronic kidney disease: Secondary | ICD-10-CM | POA: Diagnosis not present

## 2022-03-21 DIAGNOSIS — N139 Obstructive and reflux uropathy, unspecified: Secondary | ICD-10-CM | POA: Diagnosis not present

## 2022-03-21 DIAGNOSIS — R296 Repeated falls: Secondary | ICD-10-CM | POA: Diagnosis not present

## 2022-03-21 DIAGNOSIS — R2681 Unsteadiness on feet: Secondary | ICD-10-CM | POA: Diagnosis not present

## 2022-03-21 DIAGNOSIS — M6281 Muscle weakness (generalized): Secondary | ICD-10-CM | POA: Diagnosis not present

## 2022-03-22 DIAGNOSIS — R2681 Unsteadiness on feet: Secondary | ICD-10-CM | POA: Diagnosis not present

## 2022-03-22 DIAGNOSIS — R296 Repeated falls: Secondary | ICD-10-CM | POA: Diagnosis not present

## 2022-03-22 DIAGNOSIS — I130 Hypertensive heart and chronic kidney disease with heart failure and stage 1 through stage 4 chronic kidney disease, or unspecified chronic kidney disease: Secondary | ICD-10-CM | POA: Diagnosis not present

## 2022-03-22 DIAGNOSIS — N139 Obstructive and reflux uropathy, unspecified: Secondary | ICD-10-CM | POA: Diagnosis not present

## 2022-03-22 DIAGNOSIS — M6281 Muscle weakness (generalized): Secondary | ICD-10-CM | POA: Diagnosis not present

## 2022-04-04 DIAGNOSIS — N189 Chronic kidney disease, unspecified: Secondary | ICD-10-CM | POA: Diagnosis not present

## 2022-04-04 DIAGNOSIS — I480 Paroxysmal atrial fibrillation: Secondary | ICD-10-CM | POA: Diagnosis not present

## 2022-04-04 DIAGNOSIS — I509 Heart failure, unspecified: Secondary | ICD-10-CM | POA: Diagnosis not present

## 2022-04-04 DIAGNOSIS — I4891 Unspecified atrial fibrillation: Secondary | ICD-10-CM | POA: Diagnosis not present

## 2022-04-04 DIAGNOSIS — E1165 Type 2 diabetes mellitus with hyperglycemia: Secondary | ICD-10-CM | POA: Diagnosis not present

## 2022-04-04 DIAGNOSIS — E119 Type 2 diabetes mellitus without complications: Secondary | ICD-10-CM | POA: Diagnosis not present

## 2022-04-04 DIAGNOSIS — D508 Other iron deficiency anemias: Secondary | ICD-10-CM | POA: Diagnosis not present

## 2022-04-04 DIAGNOSIS — G309 Alzheimer's disease, unspecified: Secondary | ICD-10-CM | POA: Diagnosis not present

## 2022-04-06 DIAGNOSIS — M25552 Pain in left hip: Secondary | ICD-10-CM | POA: Diagnosis not present

## 2022-04-06 DIAGNOSIS — M545 Low back pain, unspecified: Secondary | ICD-10-CM | POA: Diagnosis not present

## 2022-04-06 DIAGNOSIS — M79605 Pain in left leg: Secondary | ICD-10-CM | POA: Diagnosis not present

## 2022-04-07 DIAGNOSIS — M79652 Pain in left thigh: Secondary | ICD-10-CM | POA: Diagnosis not present

## 2022-04-07 DIAGNOSIS — M545 Low back pain, unspecified: Secondary | ICD-10-CM | POA: Diagnosis not present

## 2022-04-07 DIAGNOSIS — M5136 Other intervertebral disc degeneration, lumbar region: Secondary | ICD-10-CM | POA: Diagnosis not present

## 2022-04-07 DIAGNOSIS — W19XXXA Unspecified fall, initial encounter: Secondary | ICD-10-CM | POA: Diagnosis not present

## 2022-04-30 DIAGNOSIS — E119 Type 2 diabetes mellitus without complications: Secondary | ICD-10-CM | POA: Diagnosis not present

## 2022-04-30 DIAGNOSIS — G309 Alzheimer's disease, unspecified: Secondary | ICD-10-CM | POA: Diagnosis not present

## 2022-04-30 DIAGNOSIS — D508 Other iron deficiency anemias: Secondary | ICD-10-CM | POA: Diagnosis not present

## 2022-04-30 DIAGNOSIS — I480 Paroxysmal atrial fibrillation: Secondary | ICD-10-CM | POA: Diagnosis not present

## 2022-04-30 DIAGNOSIS — I4891 Unspecified atrial fibrillation: Secondary | ICD-10-CM | POA: Diagnosis not present

## 2022-04-30 DIAGNOSIS — E1165 Type 2 diabetes mellitus with hyperglycemia: Secondary | ICD-10-CM | POA: Diagnosis not present

## 2022-04-30 DIAGNOSIS — N189 Chronic kidney disease, unspecified: Secondary | ICD-10-CM | POA: Diagnosis not present

## 2022-04-30 DIAGNOSIS — I509 Heart failure, unspecified: Secondary | ICD-10-CM | POA: Diagnosis not present

## 2022-05-04 DIAGNOSIS — D508 Other iron deficiency anemias: Secondary | ICD-10-CM | POA: Diagnosis not present

## 2022-05-04 DIAGNOSIS — G309 Alzheimer's disease, unspecified: Secondary | ICD-10-CM | POA: Diagnosis not present

## 2022-05-04 DIAGNOSIS — N189 Chronic kidney disease, unspecified: Secondary | ICD-10-CM | POA: Diagnosis not present

## 2022-05-04 DIAGNOSIS — E119 Type 2 diabetes mellitus without complications: Secondary | ICD-10-CM | POA: Diagnosis not present

## 2022-05-04 DIAGNOSIS — I480 Paroxysmal atrial fibrillation: Secondary | ICD-10-CM | POA: Diagnosis not present

## 2022-05-04 DIAGNOSIS — I509 Heart failure, unspecified: Secondary | ICD-10-CM | POA: Diagnosis not present

## 2022-05-08 DIAGNOSIS — N139 Obstructive and reflux uropathy, unspecified: Secondary | ICD-10-CM | POA: Diagnosis not present

## 2022-05-08 DIAGNOSIS — I509 Heart failure, unspecified: Secondary | ICD-10-CM | POA: Diagnosis not present

## 2022-05-08 DIAGNOSIS — E119 Type 2 diabetes mellitus without complications: Secondary | ICD-10-CM | POA: Diagnosis not present

## 2022-05-08 DIAGNOSIS — I4891 Unspecified atrial fibrillation: Secondary | ICD-10-CM | POA: Diagnosis not present

## 2022-05-08 DIAGNOSIS — N189 Chronic kidney disease, unspecified: Secondary | ICD-10-CM | POA: Diagnosis not present

## 2022-05-10 DIAGNOSIS — E1165 Type 2 diabetes mellitus with hyperglycemia: Secondary | ICD-10-CM | POA: Diagnosis not present

## 2022-05-10 DIAGNOSIS — G309 Alzheimer's disease, unspecified: Secondary | ICD-10-CM | POA: Diagnosis not present

## 2022-05-10 DIAGNOSIS — R627 Adult failure to thrive: Secondary | ICD-10-CM | POA: Diagnosis not present

## 2022-05-21 DIAGNOSIS — D508 Other iron deficiency anemias: Secondary | ICD-10-CM | POA: Diagnosis not present

## 2022-05-21 DIAGNOSIS — N189 Chronic kidney disease, unspecified: Secondary | ICD-10-CM | POA: Diagnosis not present

## 2022-05-21 DIAGNOSIS — I509 Heart failure, unspecified: Secondary | ICD-10-CM | POA: Diagnosis not present

## 2022-05-21 DIAGNOSIS — E1165 Type 2 diabetes mellitus with hyperglycemia: Secondary | ICD-10-CM | POA: Diagnosis not present

## 2022-05-21 DIAGNOSIS — I4891 Unspecified atrial fibrillation: Secondary | ICD-10-CM | POA: Diagnosis not present

## 2022-05-21 DIAGNOSIS — G309 Alzheimer's disease, unspecified: Secondary | ICD-10-CM | POA: Diagnosis not present

## 2022-05-21 DIAGNOSIS — I480 Paroxysmal atrial fibrillation: Secondary | ICD-10-CM | POA: Diagnosis not present

## 2022-05-21 DIAGNOSIS — E119 Type 2 diabetes mellitus without complications: Secondary | ICD-10-CM | POA: Diagnosis not present

## 2022-06-01 DIAGNOSIS — I509 Heart failure, unspecified: Secondary | ICD-10-CM | POA: Diagnosis not present

## 2022-06-01 DIAGNOSIS — K219 Gastro-esophageal reflux disease without esophagitis: Secondary | ICD-10-CM | POA: Diagnosis not present

## 2022-06-01 DIAGNOSIS — I480 Paroxysmal atrial fibrillation: Secondary | ICD-10-CM | POA: Diagnosis not present

## 2022-06-01 DIAGNOSIS — E119 Type 2 diabetes mellitus without complications: Secondary | ICD-10-CM | POA: Diagnosis not present

## 2022-06-01 DIAGNOSIS — H401134 Primary open-angle glaucoma, bilateral, indeterminate stage: Secondary | ICD-10-CM | POA: Diagnosis not present

## 2022-06-01 DIAGNOSIS — N189 Chronic kidney disease, unspecified: Secondary | ICD-10-CM | POA: Diagnosis not present

## 2022-06-01 DIAGNOSIS — G309 Alzheimer's disease, unspecified: Secondary | ICD-10-CM | POA: Diagnosis not present

## 2022-06-05 DIAGNOSIS — E1165 Type 2 diabetes mellitus with hyperglycemia: Secondary | ICD-10-CM | POA: Diagnosis not present

## 2022-06-05 DIAGNOSIS — I480 Paroxysmal atrial fibrillation: Secondary | ICD-10-CM | POA: Diagnosis not present

## 2022-06-05 DIAGNOSIS — D508 Other iron deficiency anemias: Secondary | ICD-10-CM | POA: Diagnosis not present

## 2022-06-05 DIAGNOSIS — B351 Tinea unguium: Secondary | ICD-10-CM | POA: Diagnosis not present

## 2022-06-05 DIAGNOSIS — I509 Heart failure, unspecified: Secondary | ICD-10-CM | POA: Diagnosis not present

## 2022-06-05 DIAGNOSIS — E1159 Type 2 diabetes mellitus with other circulatory complications: Secondary | ICD-10-CM | POA: Diagnosis not present

## 2022-06-05 DIAGNOSIS — I4891 Unspecified atrial fibrillation: Secondary | ICD-10-CM | POA: Diagnosis not present

## 2022-06-15 DIAGNOSIS — R2681 Unsteadiness on feet: Secondary | ICD-10-CM | POA: Diagnosis not present

## 2022-06-15 DIAGNOSIS — I130 Hypertensive heart and chronic kidney disease with heart failure and stage 1 through stage 4 chronic kidney disease, or unspecified chronic kidney disease: Secondary | ICD-10-CM | POA: Diagnosis not present

## 2022-06-15 DIAGNOSIS — R4702 Dysphasia: Secondary | ICD-10-CM | POA: Diagnosis not present

## 2022-06-15 DIAGNOSIS — J690 Pneumonitis due to inhalation of food and vomit: Secondary | ICD-10-CM | POA: Diagnosis not present

## 2022-06-15 DIAGNOSIS — M6281 Muscle weakness (generalized): Secondary | ICD-10-CM | POA: Diagnosis not present

## 2022-06-15 DIAGNOSIS — N139 Obstructive and reflux uropathy, unspecified: Secondary | ICD-10-CM | POA: Diagnosis not present

## 2022-06-18 DIAGNOSIS — J690 Pneumonitis due to inhalation of food and vomit: Secondary | ICD-10-CM | POA: Diagnosis not present

## 2022-06-18 DIAGNOSIS — M6281 Muscle weakness (generalized): Secondary | ICD-10-CM | POA: Diagnosis not present

## 2022-06-18 DIAGNOSIS — R2681 Unsteadiness on feet: Secondary | ICD-10-CM | POA: Diagnosis not present

## 2022-06-18 DIAGNOSIS — I130 Hypertensive heart and chronic kidney disease with heart failure and stage 1 through stage 4 chronic kidney disease, or unspecified chronic kidney disease: Secondary | ICD-10-CM | POA: Diagnosis not present

## 2022-06-18 DIAGNOSIS — N139 Obstructive and reflux uropathy, unspecified: Secondary | ICD-10-CM | POA: Diagnosis not present

## 2022-06-18 DIAGNOSIS — R4702 Dysphasia: Secondary | ICD-10-CM | POA: Diagnosis not present

## 2022-06-19 DIAGNOSIS — M6281 Muscle weakness (generalized): Secondary | ICD-10-CM | POA: Diagnosis not present

## 2022-06-19 DIAGNOSIS — R2681 Unsteadiness on feet: Secondary | ICD-10-CM | POA: Diagnosis not present

## 2022-06-19 DIAGNOSIS — R4702 Dysphasia: Secondary | ICD-10-CM | POA: Diagnosis not present

## 2022-06-19 DIAGNOSIS — I130 Hypertensive heart and chronic kidney disease with heart failure and stage 1 through stage 4 chronic kidney disease, or unspecified chronic kidney disease: Secondary | ICD-10-CM | POA: Diagnosis not present

## 2022-06-19 DIAGNOSIS — J690 Pneumonitis due to inhalation of food and vomit: Secondary | ICD-10-CM | POA: Diagnosis not present

## 2022-06-19 DIAGNOSIS — N139 Obstructive and reflux uropathy, unspecified: Secondary | ICD-10-CM | POA: Diagnosis not present

## 2022-06-20 DIAGNOSIS — K2270 Barrett's esophagus without dysplasia: Secondary | ICD-10-CM | POA: Diagnosis not present

## 2022-06-20 DIAGNOSIS — N139 Obstructive and reflux uropathy, unspecified: Secondary | ICD-10-CM | POA: Diagnosis not present

## 2022-06-20 DIAGNOSIS — M6281 Muscle weakness (generalized): Secondary | ICD-10-CM | POA: Diagnosis not present

## 2022-06-20 DIAGNOSIS — R2681 Unsteadiness on feet: Secondary | ICD-10-CM | POA: Diagnosis not present

## 2022-06-20 DIAGNOSIS — I130 Hypertensive heart and chronic kidney disease with heart failure and stage 1 through stage 4 chronic kidney disease, or unspecified chronic kidney disease: Secondary | ICD-10-CM | POA: Diagnosis not present

## 2022-06-21 DIAGNOSIS — M6281 Muscle weakness (generalized): Secondary | ICD-10-CM | POA: Diagnosis not present

## 2022-06-21 DIAGNOSIS — R2681 Unsteadiness on feet: Secondary | ICD-10-CM | POA: Diagnosis not present

## 2022-06-21 DIAGNOSIS — N139 Obstructive and reflux uropathy, unspecified: Secondary | ICD-10-CM | POA: Diagnosis not present

## 2022-06-21 DIAGNOSIS — I130 Hypertensive heart and chronic kidney disease with heart failure and stage 1 through stage 4 chronic kidney disease, or unspecified chronic kidney disease: Secondary | ICD-10-CM | POA: Diagnosis not present

## 2022-06-21 DIAGNOSIS — K2270 Barrett's esophagus without dysplasia: Secondary | ICD-10-CM | POA: Diagnosis not present

## 2022-06-22 DIAGNOSIS — I130 Hypertensive heart and chronic kidney disease with heart failure and stage 1 through stage 4 chronic kidney disease, or unspecified chronic kidney disease: Secondary | ICD-10-CM | POA: Diagnosis not present

## 2022-06-22 DIAGNOSIS — N139 Obstructive and reflux uropathy, unspecified: Secondary | ICD-10-CM | POA: Diagnosis not present

## 2022-06-22 DIAGNOSIS — K2270 Barrett's esophagus without dysplasia: Secondary | ICD-10-CM | POA: Diagnosis not present

## 2022-06-22 DIAGNOSIS — R2681 Unsteadiness on feet: Secondary | ICD-10-CM | POA: Diagnosis not present

## 2022-06-22 DIAGNOSIS — M6281 Muscle weakness (generalized): Secondary | ICD-10-CM | POA: Diagnosis not present

## 2022-06-25 DIAGNOSIS — N139 Obstructive and reflux uropathy, unspecified: Secondary | ICD-10-CM | POA: Diagnosis not present

## 2022-06-25 DIAGNOSIS — K2270 Barrett's esophagus without dysplasia: Secondary | ICD-10-CM | POA: Diagnosis not present

## 2022-06-25 DIAGNOSIS — M6281 Muscle weakness (generalized): Secondary | ICD-10-CM | POA: Diagnosis not present

## 2022-06-25 DIAGNOSIS — R2681 Unsteadiness on feet: Secondary | ICD-10-CM | POA: Diagnosis not present

## 2022-06-25 DIAGNOSIS — I130 Hypertensive heart and chronic kidney disease with heart failure and stage 1 through stage 4 chronic kidney disease, or unspecified chronic kidney disease: Secondary | ICD-10-CM | POA: Diagnosis not present

## 2022-06-26 DIAGNOSIS — M6281 Muscle weakness (generalized): Secondary | ICD-10-CM | POA: Diagnosis not present

## 2022-06-26 DIAGNOSIS — I130 Hypertensive heart and chronic kidney disease with heart failure and stage 1 through stage 4 chronic kidney disease, or unspecified chronic kidney disease: Secondary | ICD-10-CM | POA: Diagnosis not present

## 2022-06-26 DIAGNOSIS — N139 Obstructive and reflux uropathy, unspecified: Secondary | ICD-10-CM | POA: Diagnosis not present

## 2022-06-26 DIAGNOSIS — K2270 Barrett's esophagus without dysplasia: Secondary | ICD-10-CM | POA: Diagnosis not present

## 2022-06-26 DIAGNOSIS — R2681 Unsteadiness on feet: Secondary | ICD-10-CM | POA: Diagnosis not present

## 2022-06-27 DIAGNOSIS — N139 Obstructive and reflux uropathy, unspecified: Secondary | ICD-10-CM | POA: Diagnosis not present

## 2022-06-27 DIAGNOSIS — K2270 Barrett's esophagus without dysplasia: Secondary | ICD-10-CM | POA: Diagnosis not present

## 2022-06-27 DIAGNOSIS — R2681 Unsteadiness on feet: Secondary | ICD-10-CM | POA: Diagnosis not present

## 2022-06-27 DIAGNOSIS — I130 Hypertensive heart and chronic kidney disease with heart failure and stage 1 through stage 4 chronic kidney disease, or unspecified chronic kidney disease: Secondary | ICD-10-CM | POA: Diagnosis not present

## 2022-06-27 DIAGNOSIS — M6281 Muscle weakness (generalized): Secondary | ICD-10-CM | POA: Diagnosis not present

## 2022-06-28 DIAGNOSIS — K2270 Barrett's esophagus without dysplasia: Secondary | ICD-10-CM | POA: Diagnosis not present

## 2022-06-28 DIAGNOSIS — R2681 Unsteadiness on feet: Secondary | ICD-10-CM | POA: Diagnosis not present

## 2022-06-28 DIAGNOSIS — M6281 Muscle weakness (generalized): Secondary | ICD-10-CM | POA: Diagnosis not present

## 2022-06-28 DIAGNOSIS — I130 Hypertensive heart and chronic kidney disease with heart failure and stage 1 through stage 4 chronic kidney disease, or unspecified chronic kidney disease: Secondary | ICD-10-CM | POA: Diagnosis not present

## 2022-06-28 DIAGNOSIS — N139 Obstructive and reflux uropathy, unspecified: Secondary | ICD-10-CM | POA: Diagnosis not present

## 2022-06-29 DIAGNOSIS — R2681 Unsteadiness on feet: Secondary | ICD-10-CM | POA: Diagnosis not present

## 2022-06-29 DIAGNOSIS — I130 Hypertensive heart and chronic kidney disease with heart failure and stage 1 through stage 4 chronic kidney disease, or unspecified chronic kidney disease: Secondary | ICD-10-CM | POA: Diagnosis not present

## 2022-06-29 DIAGNOSIS — K2270 Barrett's esophagus without dysplasia: Secondary | ICD-10-CM | POA: Diagnosis not present

## 2022-06-29 DIAGNOSIS — M6281 Muscle weakness (generalized): Secondary | ICD-10-CM | POA: Diagnosis not present

## 2022-06-29 DIAGNOSIS — N139 Obstructive and reflux uropathy, unspecified: Secondary | ICD-10-CM | POA: Diagnosis not present

## 2022-07-02 DIAGNOSIS — N139 Obstructive and reflux uropathy, unspecified: Secondary | ICD-10-CM | POA: Diagnosis not present

## 2022-07-02 DIAGNOSIS — R2681 Unsteadiness on feet: Secondary | ICD-10-CM | POA: Diagnosis not present

## 2022-07-02 DIAGNOSIS — K2270 Barrett's esophagus without dysplasia: Secondary | ICD-10-CM | POA: Diagnosis not present

## 2022-07-02 DIAGNOSIS — M6281 Muscle weakness (generalized): Secondary | ICD-10-CM | POA: Diagnosis not present

## 2022-07-02 DIAGNOSIS — I130 Hypertensive heart and chronic kidney disease with heart failure and stage 1 through stage 4 chronic kidney disease, or unspecified chronic kidney disease: Secondary | ICD-10-CM | POA: Diagnosis not present

## 2022-07-03 DIAGNOSIS — M6281 Muscle weakness (generalized): Secondary | ICD-10-CM | POA: Diagnosis not present

## 2022-07-03 DIAGNOSIS — N139 Obstructive and reflux uropathy, unspecified: Secondary | ICD-10-CM | POA: Diagnosis not present

## 2022-07-03 DIAGNOSIS — N189 Chronic kidney disease, unspecified: Secondary | ICD-10-CM | POA: Diagnosis not present

## 2022-07-03 DIAGNOSIS — R2681 Unsteadiness on feet: Secondary | ICD-10-CM | POA: Diagnosis not present

## 2022-07-03 DIAGNOSIS — K2270 Barrett's esophagus without dysplasia: Secondary | ICD-10-CM | POA: Diagnosis not present

## 2022-07-03 DIAGNOSIS — I509 Heart failure, unspecified: Secondary | ICD-10-CM | POA: Diagnosis not present

## 2022-07-03 DIAGNOSIS — R627 Adult failure to thrive: Secondary | ICD-10-CM | POA: Diagnosis not present

## 2022-07-03 DIAGNOSIS — I4891 Unspecified atrial fibrillation: Secondary | ICD-10-CM | POA: Diagnosis not present

## 2022-07-03 DIAGNOSIS — I130 Hypertensive heart and chronic kidney disease with heart failure and stage 1 through stage 4 chronic kidney disease, or unspecified chronic kidney disease: Secondary | ICD-10-CM | POA: Diagnosis not present

## 2022-07-04 DIAGNOSIS — M6281 Muscle weakness (generalized): Secondary | ICD-10-CM | POA: Diagnosis not present

## 2022-07-04 DIAGNOSIS — R2681 Unsteadiness on feet: Secondary | ICD-10-CM | POA: Diagnosis not present

## 2022-07-04 DIAGNOSIS — I130 Hypertensive heart and chronic kidney disease with heart failure and stage 1 through stage 4 chronic kidney disease, or unspecified chronic kidney disease: Secondary | ICD-10-CM | POA: Diagnosis not present

## 2022-07-04 DIAGNOSIS — K2270 Barrett's esophagus without dysplasia: Secondary | ICD-10-CM | POA: Diagnosis not present

## 2022-07-04 DIAGNOSIS — N139 Obstructive and reflux uropathy, unspecified: Secondary | ICD-10-CM | POA: Diagnosis not present

## 2022-07-06 DIAGNOSIS — K2270 Barrett's esophagus without dysplasia: Secondary | ICD-10-CM | POA: Diagnosis not present

## 2022-07-06 DIAGNOSIS — I130 Hypertensive heart and chronic kidney disease with heart failure and stage 1 through stage 4 chronic kidney disease, or unspecified chronic kidney disease: Secondary | ICD-10-CM | POA: Diagnosis not present

## 2022-07-06 DIAGNOSIS — R2681 Unsteadiness on feet: Secondary | ICD-10-CM | POA: Diagnosis not present

## 2022-07-06 DIAGNOSIS — N139 Obstructive and reflux uropathy, unspecified: Secondary | ICD-10-CM | POA: Diagnosis not present

## 2022-07-06 DIAGNOSIS — M6281 Muscle weakness (generalized): Secondary | ICD-10-CM | POA: Diagnosis not present

## 2022-07-11 DIAGNOSIS — M6281 Muscle weakness (generalized): Secondary | ICD-10-CM | POA: Diagnosis not present

## 2022-07-11 DIAGNOSIS — R2681 Unsteadiness on feet: Secondary | ICD-10-CM | POA: Diagnosis not present

## 2022-07-11 DIAGNOSIS — N139 Obstructive and reflux uropathy, unspecified: Secondary | ICD-10-CM | POA: Diagnosis not present

## 2022-07-11 DIAGNOSIS — I130 Hypertensive heart and chronic kidney disease with heart failure and stage 1 through stage 4 chronic kidney disease, or unspecified chronic kidney disease: Secondary | ICD-10-CM | POA: Diagnosis not present

## 2022-07-11 DIAGNOSIS — K2270 Barrett's esophagus without dysplasia: Secondary | ICD-10-CM | POA: Diagnosis not present

## 2022-07-12 DIAGNOSIS — N139 Obstructive and reflux uropathy, unspecified: Secondary | ICD-10-CM | POA: Diagnosis not present

## 2022-07-12 DIAGNOSIS — K2270 Barrett's esophagus without dysplasia: Secondary | ICD-10-CM | POA: Diagnosis not present

## 2022-07-12 DIAGNOSIS — M6281 Muscle weakness (generalized): Secondary | ICD-10-CM | POA: Diagnosis not present

## 2022-07-12 DIAGNOSIS — R2681 Unsteadiness on feet: Secondary | ICD-10-CM | POA: Diagnosis not present

## 2022-07-12 DIAGNOSIS — I130 Hypertensive heart and chronic kidney disease with heart failure and stage 1 through stage 4 chronic kidney disease, or unspecified chronic kidney disease: Secondary | ICD-10-CM | POA: Diagnosis not present

## 2022-07-16 DIAGNOSIS — I480 Paroxysmal atrial fibrillation: Secondary | ICD-10-CM | POA: Diagnosis not present

## 2022-07-16 DIAGNOSIS — K219 Gastro-esophageal reflux disease without esophagitis: Secondary | ICD-10-CM | POA: Diagnosis not present

## 2022-07-16 DIAGNOSIS — D508 Other iron deficiency anemias: Secondary | ICD-10-CM | POA: Diagnosis not present

## 2022-07-16 DIAGNOSIS — N189 Chronic kidney disease, unspecified: Secondary | ICD-10-CM | POA: Diagnosis not present

## 2022-07-16 DIAGNOSIS — G309 Alzheimer's disease, unspecified: Secondary | ICD-10-CM | POA: Diagnosis not present

## 2022-07-16 DIAGNOSIS — E119 Type 2 diabetes mellitus without complications: Secondary | ICD-10-CM | POA: Diagnosis not present

## 2022-07-16 DIAGNOSIS — I509 Heart failure, unspecified: Secondary | ICD-10-CM | POA: Diagnosis not present

## 2022-07-16 DIAGNOSIS — I4891 Unspecified atrial fibrillation: Secondary | ICD-10-CM | POA: Diagnosis not present

## 2022-07-16 DIAGNOSIS — E1165 Type 2 diabetes mellitus with hyperglycemia: Secondary | ICD-10-CM | POA: Diagnosis not present

## 2022-07-17 DIAGNOSIS — M6281 Muscle weakness (generalized): Secondary | ICD-10-CM | POA: Diagnosis not present

## 2022-07-17 DIAGNOSIS — N139 Obstructive and reflux uropathy, unspecified: Secondary | ICD-10-CM | POA: Diagnosis not present

## 2022-07-17 DIAGNOSIS — I130 Hypertensive heart and chronic kidney disease with heart failure and stage 1 through stage 4 chronic kidney disease, or unspecified chronic kidney disease: Secondary | ICD-10-CM | POA: Diagnosis not present

## 2022-07-17 DIAGNOSIS — R2681 Unsteadiness on feet: Secondary | ICD-10-CM | POA: Diagnosis not present

## 2022-07-17 DIAGNOSIS — K2270 Barrett's esophagus without dysplasia: Secondary | ICD-10-CM | POA: Diagnosis not present

## 2022-07-18 DIAGNOSIS — M6281 Muscle weakness (generalized): Secondary | ICD-10-CM | POA: Diagnosis not present

## 2022-07-18 DIAGNOSIS — I130 Hypertensive heart and chronic kidney disease with heart failure and stage 1 through stage 4 chronic kidney disease, or unspecified chronic kidney disease: Secondary | ICD-10-CM | POA: Diagnosis not present

## 2022-07-18 DIAGNOSIS — N139 Obstructive and reflux uropathy, unspecified: Secondary | ICD-10-CM | POA: Diagnosis not present

## 2022-07-18 DIAGNOSIS — R2681 Unsteadiness on feet: Secondary | ICD-10-CM | POA: Diagnosis not present

## 2022-07-18 DIAGNOSIS — K2270 Barrett's esophagus without dysplasia: Secondary | ICD-10-CM | POA: Diagnosis not present

## 2022-07-19 DIAGNOSIS — R2681 Unsteadiness on feet: Secondary | ICD-10-CM | POA: Diagnosis not present

## 2022-07-19 DIAGNOSIS — K2270 Barrett's esophagus without dysplasia: Secondary | ICD-10-CM | POA: Diagnosis not present

## 2022-07-19 DIAGNOSIS — M6281 Muscle weakness (generalized): Secondary | ICD-10-CM | POA: Diagnosis not present

## 2022-07-19 DIAGNOSIS — N139 Obstructive and reflux uropathy, unspecified: Secondary | ICD-10-CM | POA: Diagnosis not present

## 2022-07-19 DIAGNOSIS — I130 Hypertensive heart and chronic kidney disease with heart failure and stage 1 through stage 4 chronic kidney disease, or unspecified chronic kidney disease: Secondary | ICD-10-CM | POA: Diagnosis not present

## 2022-07-21 DIAGNOSIS — D508 Other iron deficiency anemias: Secondary | ICD-10-CM | POA: Diagnosis not present

## 2022-07-21 DIAGNOSIS — I4891 Unspecified atrial fibrillation: Secondary | ICD-10-CM | POA: Diagnosis not present

## 2022-07-21 DIAGNOSIS — I509 Heart failure, unspecified: Secondary | ICD-10-CM | POA: Diagnosis not present

## 2022-07-21 DIAGNOSIS — E119 Type 2 diabetes mellitus without complications: Secondary | ICD-10-CM | POA: Diagnosis not present

## 2022-07-21 DIAGNOSIS — I480 Paroxysmal atrial fibrillation: Secondary | ICD-10-CM | POA: Diagnosis not present

## 2022-07-21 DIAGNOSIS — E1165 Type 2 diabetes mellitus with hyperglycemia: Secondary | ICD-10-CM | POA: Diagnosis not present

## 2022-07-21 DIAGNOSIS — N189 Chronic kidney disease, unspecified: Secondary | ICD-10-CM | POA: Diagnosis not present

## 2022-07-21 DIAGNOSIS — G309 Alzheimer's disease, unspecified: Secondary | ICD-10-CM | POA: Diagnosis not present

## 2022-07-24 DIAGNOSIS — M6281 Muscle weakness (generalized): Secondary | ICD-10-CM | POA: Diagnosis not present

## 2022-07-24 DIAGNOSIS — I130 Hypertensive heart and chronic kidney disease with heart failure and stage 1 through stage 4 chronic kidney disease, or unspecified chronic kidney disease: Secondary | ICD-10-CM | POA: Diagnosis not present

## 2022-07-24 DIAGNOSIS — R2681 Unsteadiness on feet: Secondary | ICD-10-CM | POA: Diagnosis not present

## 2022-07-24 DIAGNOSIS — N139 Obstructive and reflux uropathy, unspecified: Secondary | ICD-10-CM | POA: Diagnosis not present

## 2022-07-25 DIAGNOSIS — M6281 Muscle weakness (generalized): Secondary | ICD-10-CM | POA: Diagnosis not present

## 2022-07-25 DIAGNOSIS — I130 Hypertensive heart and chronic kidney disease with heart failure and stage 1 through stage 4 chronic kidney disease, or unspecified chronic kidney disease: Secondary | ICD-10-CM | POA: Diagnosis not present

## 2022-07-25 DIAGNOSIS — R2681 Unsteadiness on feet: Secondary | ICD-10-CM | POA: Diagnosis not present

## 2022-07-25 DIAGNOSIS — N139 Obstructive and reflux uropathy, unspecified: Secondary | ICD-10-CM | POA: Diagnosis not present

## 2022-07-26 DIAGNOSIS — M6281 Muscle weakness (generalized): Secondary | ICD-10-CM | POA: Diagnosis not present

## 2022-07-26 DIAGNOSIS — N139 Obstructive and reflux uropathy, unspecified: Secondary | ICD-10-CM | POA: Diagnosis not present

## 2022-07-26 DIAGNOSIS — I130 Hypertensive heart and chronic kidney disease with heart failure and stage 1 through stage 4 chronic kidney disease, or unspecified chronic kidney disease: Secondary | ICD-10-CM | POA: Diagnosis not present

## 2022-07-26 DIAGNOSIS — R2681 Unsteadiness on feet: Secondary | ICD-10-CM | POA: Diagnosis not present

## 2022-08-03 DIAGNOSIS — I4891 Unspecified atrial fibrillation: Secondary | ICD-10-CM | POA: Diagnosis not present

## 2022-08-03 DIAGNOSIS — N189 Chronic kidney disease, unspecified: Secondary | ICD-10-CM | POA: Diagnosis not present

## 2022-08-03 DIAGNOSIS — E1165 Type 2 diabetes mellitus with hyperglycemia: Secondary | ICD-10-CM | POA: Diagnosis not present

## 2022-08-03 DIAGNOSIS — N139 Obstructive and reflux uropathy, unspecified: Secondary | ICD-10-CM | POA: Diagnosis not present

## 2022-08-03 DIAGNOSIS — I509 Heart failure, unspecified: Secondary | ICD-10-CM | POA: Diagnosis not present

## 2022-08-07 DIAGNOSIS — Z23 Encounter for immunization: Secondary | ICD-10-CM | POA: Diagnosis not present

## 2022-08-17 DIAGNOSIS — Z8616 Personal history of COVID-19: Secondary | ICD-10-CM | POA: Diagnosis not present

## 2022-08-20 DIAGNOSIS — E1165 Type 2 diabetes mellitus with hyperglycemia: Secondary | ICD-10-CM | POA: Diagnosis not present

## 2022-08-20 DIAGNOSIS — I509 Heart failure, unspecified: Secondary | ICD-10-CM | POA: Diagnosis not present

## 2022-08-20 DIAGNOSIS — N189 Chronic kidney disease, unspecified: Secondary | ICD-10-CM | POA: Diagnosis not present

## 2022-08-20 DIAGNOSIS — E119 Type 2 diabetes mellitus without complications: Secondary | ICD-10-CM | POA: Diagnosis not present

## 2022-08-20 DIAGNOSIS — I4891 Unspecified atrial fibrillation: Secondary | ICD-10-CM | POA: Diagnosis not present

## 2022-08-20 DIAGNOSIS — I480 Paroxysmal atrial fibrillation: Secondary | ICD-10-CM | POA: Diagnosis not present

## 2022-08-20 DIAGNOSIS — D508 Other iron deficiency anemias: Secondary | ICD-10-CM | POA: Diagnosis not present

## 2022-08-20 DIAGNOSIS — G309 Alzheimer's disease, unspecified: Secondary | ICD-10-CM | POA: Diagnosis not present

## 2022-08-23 DIAGNOSIS — I130 Hypertensive heart and chronic kidney disease with heart failure and stage 1 through stage 4 chronic kidney disease, or unspecified chronic kidney disease: Secondary | ICD-10-CM | POA: Diagnosis not present

## 2022-08-23 DIAGNOSIS — N139 Obstructive and reflux uropathy, unspecified: Secondary | ICD-10-CM | POA: Diagnosis not present

## 2022-08-23 DIAGNOSIS — M6281 Muscle weakness (generalized): Secondary | ICD-10-CM | POA: Diagnosis not present

## 2022-08-23 DIAGNOSIS — R296 Repeated falls: Secondary | ICD-10-CM | POA: Diagnosis not present

## 2022-08-23 DIAGNOSIS — R2681 Unsteadiness on feet: Secondary | ICD-10-CM | POA: Diagnosis not present

## 2022-08-24 DIAGNOSIS — I4891 Unspecified atrial fibrillation: Secondary | ICD-10-CM | POA: Diagnosis not present

## 2022-08-24 DIAGNOSIS — G309 Alzheimer's disease, unspecified: Secondary | ICD-10-CM | POA: Diagnosis not present

## 2022-08-24 DIAGNOSIS — N189 Chronic kidney disease, unspecified: Secondary | ICD-10-CM | POA: Diagnosis not present

## 2022-08-24 DIAGNOSIS — I509 Heart failure, unspecified: Secondary | ICD-10-CM | POA: Diagnosis not present

## 2022-08-24 DIAGNOSIS — E119 Type 2 diabetes mellitus without complications: Secondary | ICD-10-CM | POA: Diagnosis not present

## 2022-08-30 DIAGNOSIS — I130 Hypertensive heart and chronic kidney disease with heart failure and stage 1 through stage 4 chronic kidney disease, or unspecified chronic kidney disease: Secondary | ICD-10-CM | POA: Diagnosis not present

## 2022-08-30 DIAGNOSIS — M6281 Muscle weakness (generalized): Secondary | ICD-10-CM | POA: Diagnosis not present

## 2022-08-30 DIAGNOSIS — R296 Repeated falls: Secondary | ICD-10-CM | POA: Diagnosis not present

## 2022-08-30 DIAGNOSIS — R2681 Unsteadiness on feet: Secondary | ICD-10-CM | POA: Diagnosis not present

## 2022-08-30 DIAGNOSIS — N139 Obstructive and reflux uropathy, unspecified: Secondary | ICD-10-CM | POA: Diagnosis not present

## 2022-09-03 DIAGNOSIS — E1165 Type 2 diabetes mellitus with hyperglycemia: Secondary | ICD-10-CM | POA: Diagnosis not present

## 2022-09-03 DIAGNOSIS — D508 Other iron deficiency anemias: Secondary | ICD-10-CM | POA: Diagnosis not present

## 2022-09-03 DIAGNOSIS — I4891 Unspecified atrial fibrillation: Secondary | ICD-10-CM | POA: Diagnosis not present

## 2022-09-03 DIAGNOSIS — I509 Heart failure, unspecified: Secondary | ICD-10-CM | POA: Diagnosis not present

## 2022-09-03 DIAGNOSIS — N139 Obstructive and reflux uropathy, unspecified: Secondary | ICD-10-CM | POA: Diagnosis not present

## 2022-09-08 DIAGNOSIS — E1159 Type 2 diabetes mellitus with other circulatory complications: Secondary | ICD-10-CM | POA: Diagnosis not present

## 2022-09-08 DIAGNOSIS — B351 Tinea unguium: Secondary | ICD-10-CM | POA: Diagnosis not present

## 2022-09-11 DIAGNOSIS — R296 Repeated falls: Secondary | ICD-10-CM | POA: Diagnosis not present

## 2022-09-11 DIAGNOSIS — I130 Hypertensive heart and chronic kidney disease with heart failure and stage 1 through stage 4 chronic kidney disease, or unspecified chronic kidney disease: Secondary | ICD-10-CM | POA: Diagnosis not present

## 2022-09-11 DIAGNOSIS — R2681 Unsteadiness on feet: Secondary | ICD-10-CM | POA: Diagnosis not present

## 2022-09-11 DIAGNOSIS — N139 Obstructive and reflux uropathy, unspecified: Secondary | ICD-10-CM | POA: Diagnosis not present

## 2022-09-11 DIAGNOSIS — M6281 Muscle weakness (generalized): Secondary | ICD-10-CM | POA: Diagnosis not present

## 2022-09-20 DIAGNOSIS — I480 Paroxysmal atrial fibrillation: Secondary | ICD-10-CM | POA: Diagnosis not present

## 2022-09-20 DIAGNOSIS — N189 Chronic kidney disease, unspecified: Secondary | ICD-10-CM | POA: Diagnosis not present

## 2022-09-20 DIAGNOSIS — G309 Alzheimer's disease, unspecified: Secondary | ICD-10-CM | POA: Diagnosis not present

## 2022-09-20 DIAGNOSIS — E1165 Type 2 diabetes mellitus with hyperglycemia: Secondary | ICD-10-CM | POA: Diagnosis not present

## 2022-09-20 DIAGNOSIS — E119 Type 2 diabetes mellitus without complications: Secondary | ICD-10-CM | POA: Diagnosis not present

## 2022-09-20 DIAGNOSIS — D508 Other iron deficiency anemias: Secondary | ICD-10-CM | POA: Diagnosis not present

## 2022-09-20 DIAGNOSIS — I4891 Unspecified atrial fibrillation: Secondary | ICD-10-CM | POA: Diagnosis not present

## 2022-09-20 DIAGNOSIS — I509 Heart failure, unspecified: Secondary | ICD-10-CM | POA: Diagnosis not present

## 2022-09-21 DIAGNOSIS — R627 Adult failure to thrive: Secondary | ICD-10-CM | POA: Diagnosis not present

## 2022-09-21 DIAGNOSIS — I130 Hypertensive heart and chronic kidney disease with heart failure and stage 1 through stage 4 chronic kidney disease, or unspecified chronic kidney disease: Secondary | ICD-10-CM | POA: Diagnosis not present

## 2022-09-21 DIAGNOSIS — M6281 Muscle weakness (generalized): Secondary | ICD-10-CM | POA: Diagnosis not present

## 2022-09-21 DIAGNOSIS — R2681 Unsteadiness on feet: Secondary | ICD-10-CM | POA: Diagnosis not present

## 2022-09-21 DIAGNOSIS — R1311 Dysphagia, oral phase: Secondary | ICD-10-CM | POA: Diagnosis not present

## 2022-09-21 DIAGNOSIS — N139 Obstructive and reflux uropathy, unspecified: Secondary | ICD-10-CM | POA: Diagnosis not present

## 2022-09-24 DIAGNOSIS — R627 Adult failure to thrive: Secondary | ICD-10-CM | POA: Diagnosis not present

## 2022-09-24 DIAGNOSIS — I130 Hypertensive heart and chronic kidney disease with heart failure and stage 1 through stage 4 chronic kidney disease, or unspecified chronic kidney disease: Secondary | ICD-10-CM | POA: Diagnosis not present

## 2022-09-24 DIAGNOSIS — R1311 Dysphagia, oral phase: Secondary | ICD-10-CM | POA: Diagnosis not present

## 2022-09-24 DIAGNOSIS — R2681 Unsteadiness on feet: Secondary | ICD-10-CM | POA: Diagnosis not present

## 2022-09-24 DIAGNOSIS — M6281 Muscle weakness (generalized): Secondary | ICD-10-CM | POA: Diagnosis not present

## 2022-09-24 DIAGNOSIS — N139 Obstructive and reflux uropathy, unspecified: Secondary | ICD-10-CM | POA: Diagnosis not present

## 2022-09-25 DIAGNOSIS — R2681 Unsteadiness on feet: Secondary | ICD-10-CM | POA: Diagnosis not present

## 2022-09-25 DIAGNOSIS — I130 Hypertensive heart and chronic kidney disease with heart failure and stage 1 through stage 4 chronic kidney disease, or unspecified chronic kidney disease: Secondary | ICD-10-CM | POA: Diagnosis not present

## 2022-09-25 DIAGNOSIS — R1311 Dysphagia, oral phase: Secondary | ICD-10-CM | POA: Diagnosis not present

## 2022-09-25 DIAGNOSIS — R627 Adult failure to thrive: Secondary | ICD-10-CM | POA: Diagnosis not present

## 2022-09-25 DIAGNOSIS — M6281 Muscle weakness (generalized): Secondary | ICD-10-CM | POA: Diagnosis not present

## 2022-09-25 DIAGNOSIS — N139 Obstructive and reflux uropathy, unspecified: Secondary | ICD-10-CM | POA: Diagnosis not present

## 2022-09-26 DIAGNOSIS — M6281 Muscle weakness (generalized): Secondary | ICD-10-CM | POA: Diagnosis not present

## 2022-09-26 DIAGNOSIS — R627 Adult failure to thrive: Secondary | ICD-10-CM | POA: Diagnosis not present

## 2022-09-26 DIAGNOSIS — R2681 Unsteadiness on feet: Secondary | ICD-10-CM | POA: Diagnosis not present

## 2022-09-26 DIAGNOSIS — N139 Obstructive and reflux uropathy, unspecified: Secondary | ICD-10-CM | POA: Diagnosis not present

## 2022-09-26 DIAGNOSIS — I130 Hypertensive heart and chronic kidney disease with heart failure and stage 1 through stage 4 chronic kidney disease, or unspecified chronic kidney disease: Secondary | ICD-10-CM | POA: Diagnosis not present

## 2022-09-26 DIAGNOSIS — R1311 Dysphagia, oral phase: Secondary | ICD-10-CM | POA: Diagnosis not present

## 2022-09-27 DIAGNOSIS — M6281 Muscle weakness (generalized): Secondary | ICD-10-CM | POA: Diagnosis not present

## 2022-09-27 DIAGNOSIS — I130 Hypertensive heart and chronic kidney disease with heart failure and stage 1 through stage 4 chronic kidney disease, or unspecified chronic kidney disease: Secondary | ICD-10-CM | POA: Diagnosis not present

## 2022-09-27 DIAGNOSIS — R1311 Dysphagia, oral phase: Secondary | ICD-10-CM | POA: Diagnosis not present

## 2022-09-27 DIAGNOSIS — N139 Obstructive and reflux uropathy, unspecified: Secondary | ICD-10-CM | POA: Diagnosis not present

## 2022-09-27 DIAGNOSIS — R627 Adult failure to thrive: Secondary | ICD-10-CM | POA: Diagnosis not present

## 2022-09-27 DIAGNOSIS — R2681 Unsteadiness on feet: Secondary | ICD-10-CM | POA: Diagnosis not present

## 2022-09-28 DIAGNOSIS — R1311 Dysphagia, oral phase: Secondary | ICD-10-CM | POA: Diagnosis not present

## 2022-09-28 DIAGNOSIS — M6281 Muscle weakness (generalized): Secondary | ICD-10-CM | POA: Diagnosis not present

## 2022-09-28 DIAGNOSIS — R2681 Unsteadiness on feet: Secondary | ICD-10-CM | POA: Diagnosis not present

## 2022-09-28 DIAGNOSIS — R627 Adult failure to thrive: Secondary | ICD-10-CM | POA: Diagnosis not present

## 2022-09-28 DIAGNOSIS — I130 Hypertensive heart and chronic kidney disease with heart failure and stage 1 through stage 4 chronic kidney disease, or unspecified chronic kidney disease: Secondary | ICD-10-CM | POA: Diagnosis not present

## 2022-09-28 DIAGNOSIS — N139 Obstructive and reflux uropathy, unspecified: Secondary | ICD-10-CM | POA: Diagnosis not present

## 2022-09-30 DIAGNOSIS — M6281 Muscle weakness (generalized): Secondary | ICD-10-CM | POA: Diagnosis not present

## 2022-09-30 DIAGNOSIS — R1311 Dysphagia, oral phase: Secondary | ICD-10-CM | POA: Diagnosis not present

## 2022-09-30 DIAGNOSIS — R2681 Unsteadiness on feet: Secondary | ICD-10-CM | POA: Diagnosis not present

## 2022-09-30 DIAGNOSIS — R627 Adult failure to thrive: Secondary | ICD-10-CM | POA: Diagnosis not present

## 2022-09-30 DIAGNOSIS — N139 Obstructive and reflux uropathy, unspecified: Secondary | ICD-10-CM | POA: Diagnosis not present

## 2022-09-30 DIAGNOSIS — I130 Hypertensive heart and chronic kidney disease with heart failure and stage 1 through stage 4 chronic kidney disease, or unspecified chronic kidney disease: Secondary | ICD-10-CM | POA: Diagnosis not present

## 2022-10-01 DIAGNOSIS — I130 Hypertensive heart and chronic kidney disease with heart failure and stage 1 through stage 4 chronic kidney disease, or unspecified chronic kidney disease: Secondary | ICD-10-CM | POA: Diagnosis not present

## 2022-10-01 DIAGNOSIS — R627 Adult failure to thrive: Secondary | ICD-10-CM | POA: Diagnosis not present

## 2022-10-01 DIAGNOSIS — R1311 Dysphagia, oral phase: Secondary | ICD-10-CM | POA: Diagnosis not present

## 2022-10-01 DIAGNOSIS — N139 Obstructive and reflux uropathy, unspecified: Secondary | ICD-10-CM | POA: Diagnosis not present

## 2022-10-01 DIAGNOSIS — M6281 Muscle weakness (generalized): Secondary | ICD-10-CM | POA: Diagnosis not present

## 2022-10-01 DIAGNOSIS — R2681 Unsteadiness on feet: Secondary | ICD-10-CM | POA: Diagnosis not present

## 2022-10-03 DIAGNOSIS — R1311 Dysphagia, oral phase: Secondary | ICD-10-CM | POA: Diagnosis not present

## 2022-10-03 DIAGNOSIS — M6281 Muscle weakness (generalized): Secondary | ICD-10-CM | POA: Diagnosis not present

## 2022-10-03 DIAGNOSIS — R2681 Unsteadiness on feet: Secondary | ICD-10-CM | POA: Diagnosis not present

## 2022-10-03 DIAGNOSIS — I4891 Unspecified atrial fibrillation: Secondary | ICD-10-CM | POA: Diagnosis not present

## 2022-10-03 DIAGNOSIS — N139 Obstructive and reflux uropathy, unspecified: Secondary | ICD-10-CM | POA: Diagnosis not present

## 2022-10-03 DIAGNOSIS — N189 Chronic kidney disease, unspecified: Secondary | ICD-10-CM | POA: Diagnosis not present

## 2022-10-03 DIAGNOSIS — E1165 Type 2 diabetes mellitus with hyperglycemia: Secondary | ICD-10-CM | POA: Diagnosis not present

## 2022-10-03 DIAGNOSIS — I509 Heart failure, unspecified: Secondary | ICD-10-CM | POA: Diagnosis not present

## 2022-10-03 DIAGNOSIS — I130 Hypertensive heart and chronic kidney disease with heart failure and stage 1 through stage 4 chronic kidney disease, or unspecified chronic kidney disease: Secondary | ICD-10-CM | POA: Diagnosis not present

## 2022-10-03 DIAGNOSIS — R627 Adult failure to thrive: Secondary | ICD-10-CM | POA: Diagnosis not present

## 2022-10-04 DIAGNOSIS — N139 Obstructive and reflux uropathy, unspecified: Secondary | ICD-10-CM | POA: Diagnosis not present

## 2022-10-04 DIAGNOSIS — R1311 Dysphagia, oral phase: Secondary | ICD-10-CM | POA: Diagnosis not present

## 2022-10-04 DIAGNOSIS — M6281 Muscle weakness (generalized): Secondary | ICD-10-CM | POA: Diagnosis not present

## 2022-10-04 DIAGNOSIS — R2681 Unsteadiness on feet: Secondary | ICD-10-CM | POA: Diagnosis not present

## 2022-10-04 DIAGNOSIS — I130 Hypertensive heart and chronic kidney disease with heart failure and stage 1 through stage 4 chronic kidney disease, or unspecified chronic kidney disease: Secondary | ICD-10-CM | POA: Diagnosis not present

## 2022-10-04 DIAGNOSIS — R627 Adult failure to thrive: Secondary | ICD-10-CM | POA: Diagnosis not present

## 2022-10-10 DIAGNOSIS — I4891 Unspecified atrial fibrillation: Secondary | ICD-10-CM | POA: Diagnosis not present

## 2022-10-10 DIAGNOSIS — I509 Heart failure, unspecified: Secondary | ICD-10-CM | POA: Diagnosis not present

## 2022-10-10 DIAGNOSIS — G309 Alzheimer's disease, unspecified: Secondary | ICD-10-CM | POA: Diagnosis not present

## 2022-10-10 DIAGNOSIS — N189 Chronic kidney disease, unspecified: Secondary | ICD-10-CM | POA: Diagnosis not present

## 2022-10-10 DIAGNOSIS — D508 Other iron deficiency anemias: Secondary | ICD-10-CM | POA: Diagnosis not present

## 2022-10-19 DIAGNOSIS — G309 Alzheimer's disease, unspecified: Secondary | ICD-10-CM | POA: Diagnosis not present

## 2022-10-19 DIAGNOSIS — I480 Paroxysmal atrial fibrillation: Secondary | ICD-10-CM | POA: Diagnosis not present

## 2022-10-19 DIAGNOSIS — E119 Type 2 diabetes mellitus without complications: Secondary | ICD-10-CM | POA: Diagnosis not present

## 2022-10-19 DIAGNOSIS — N189 Chronic kidney disease, unspecified: Secondary | ICD-10-CM | POA: Diagnosis not present

## 2022-10-19 DIAGNOSIS — I4891 Unspecified atrial fibrillation: Secondary | ICD-10-CM | POA: Diagnosis not present

## 2022-10-19 DIAGNOSIS — D508 Other iron deficiency anemias: Secondary | ICD-10-CM | POA: Diagnosis not present

## 2022-10-19 DIAGNOSIS — I509 Heart failure, unspecified: Secondary | ICD-10-CM | POA: Diagnosis not present

## 2022-10-19 DIAGNOSIS — E1165 Type 2 diabetes mellitus with hyperglycemia: Secondary | ICD-10-CM | POA: Diagnosis not present

## 2022-10-26 DIAGNOSIS — R627 Adult failure to thrive: Secondary | ICD-10-CM | POA: Diagnosis not present

## 2022-10-26 DIAGNOSIS — M6281 Muscle weakness (generalized): Secondary | ICD-10-CM | POA: Diagnosis not present

## 2022-10-26 DIAGNOSIS — I130 Hypertensive heart and chronic kidney disease with heart failure and stage 1 through stage 4 chronic kidney disease, or unspecified chronic kidney disease: Secondary | ICD-10-CM | POA: Diagnosis not present

## 2022-10-26 DIAGNOSIS — R2681 Unsteadiness on feet: Secondary | ICD-10-CM | POA: Diagnosis not present

## 2022-10-26 DIAGNOSIS — N139 Obstructive and reflux uropathy, unspecified: Secondary | ICD-10-CM | POA: Diagnosis not present

## 2022-10-31 DIAGNOSIS — Z961 Presence of intraocular lens: Secondary | ICD-10-CM | POA: Diagnosis not present

## 2022-10-31 DIAGNOSIS — H401134 Primary open-angle glaucoma, bilateral, indeterminate stage: Secondary | ICD-10-CM | POA: Diagnosis not present

## 2022-10-31 DIAGNOSIS — N189 Chronic kidney disease, unspecified: Secondary | ICD-10-CM | POA: Diagnosis not present

## 2022-10-31 DIAGNOSIS — I509 Heart failure, unspecified: Secondary | ICD-10-CM | POA: Diagnosis not present

## 2022-10-31 DIAGNOSIS — I4891 Unspecified atrial fibrillation: Secondary | ICD-10-CM | POA: Diagnosis not present

## 2022-10-31 DIAGNOSIS — N139 Obstructive and reflux uropathy, unspecified: Secondary | ICD-10-CM | POA: Diagnosis not present

## 2022-10-31 DIAGNOSIS — E1165 Type 2 diabetes mellitus with hyperglycemia: Secondary | ICD-10-CM | POA: Diagnosis not present

## 2022-11-06 DIAGNOSIS — G309 Alzheimer's disease, unspecified: Secondary | ICD-10-CM | POA: Diagnosis not present

## 2022-11-06 DIAGNOSIS — I4891 Unspecified atrial fibrillation: Secondary | ICD-10-CM | POA: Diagnosis not present

## 2022-11-06 DIAGNOSIS — N189 Chronic kidney disease, unspecified: Secondary | ICD-10-CM | POA: Diagnosis not present

## 2022-11-06 DIAGNOSIS — I509 Heart failure, unspecified: Secondary | ICD-10-CM | POA: Diagnosis not present

## 2022-11-06 DIAGNOSIS — K219 Gastro-esophageal reflux disease without esophagitis: Secondary | ICD-10-CM | POA: Diagnosis not present

## 2022-11-07 DIAGNOSIS — I130 Hypertensive heart and chronic kidney disease with heart failure and stage 1 through stage 4 chronic kidney disease, or unspecified chronic kidney disease: Secondary | ICD-10-CM | POA: Diagnosis not present

## 2022-11-07 DIAGNOSIS — R2681 Unsteadiness on feet: Secondary | ICD-10-CM | POA: Diagnosis not present

## 2022-11-07 DIAGNOSIS — N139 Obstructive and reflux uropathy, unspecified: Secondary | ICD-10-CM | POA: Diagnosis not present

## 2022-11-07 DIAGNOSIS — M6281 Muscle weakness (generalized): Secondary | ICD-10-CM | POA: Diagnosis not present

## 2022-11-07 DIAGNOSIS — R627 Adult failure to thrive: Secondary | ICD-10-CM | POA: Diagnosis not present

## 2022-11-15 DIAGNOSIS — I130 Hypertensive heart and chronic kidney disease with heart failure and stage 1 through stage 4 chronic kidney disease, or unspecified chronic kidney disease: Secondary | ICD-10-CM | POA: Diagnosis not present

## 2022-11-15 DIAGNOSIS — N139 Obstructive and reflux uropathy, unspecified: Secondary | ICD-10-CM | POA: Diagnosis not present

## 2022-11-15 DIAGNOSIS — R627 Adult failure to thrive: Secondary | ICD-10-CM | POA: Diagnosis not present

## 2022-11-15 DIAGNOSIS — M6281 Muscle weakness (generalized): Secondary | ICD-10-CM | POA: Diagnosis not present

## 2022-11-15 DIAGNOSIS — R2681 Unsteadiness on feet: Secondary | ICD-10-CM | POA: Diagnosis not present

## 2022-11-19 DIAGNOSIS — N189 Chronic kidney disease, unspecified: Secondary | ICD-10-CM | POA: Diagnosis not present

## 2022-11-19 DIAGNOSIS — E119 Type 2 diabetes mellitus without complications: Secondary | ICD-10-CM | POA: Diagnosis not present

## 2022-11-19 DIAGNOSIS — I509 Heart failure, unspecified: Secondary | ICD-10-CM | POA: Diagnosis not present

## 2022-11-19 DIAGNOSIS — G309 Alzheimer's disease, unspecified: Secondary | ICD-10-CM | POA: Diagnosis not present

## 2022-11-19 DIAGNOSIS — I480 Paroxysmal atrial fibrillation: Secondary | ICD-10-CM | POA: Diagnosis not present

## 2022-11-19 DIAGNOSIS — D508 Other iron deficiency anemias: Secondary | ICD-10-CM | POA: Diagnosis not present

## 2022-11-19 DIAGNOSIS — E1165 Type 2 diabetes mellitus with hyperglycemia: Secondary | ICD-10-CM | POA: Diagnosis not present

## 2022-11-19 DIAGNOSIS — I4891 Unspecified atrial fibrillation: Secondary | ICD-10-CM | POA: Diagnosis not present

## 2022-11-22 DIAGNOSIS — I130 Hypertensive heart and chronic kidney disease with heart failure and stage 1 through stage 4 chronic kidney disease, or unspecified chronic kidney disease: Secondary | ICD-10-CM | POA: Diagnosis not present

## 2022-11-22 DIAGNOSIS — R2681 Unsteadiness on feet: Secondary | ICD-10-CM | POA: Diagnosis not present

## 2022-11-22 DIAGNOSIS — M6281 Muscle weakness (generalized): Secondary | ICD-10-CM | POA: Diagnosis not present

## 2022-11-22 DIAGNOSIS — N139 Obstructive and reflux uropathy, unspecified: Secondary | ICD-10-CM | POA: Diagnosis not present

## 2022-11-23 DIAGNOSIS — I130 Hypertensive heart and chronic kidney disease with heart failure and stage 1 through stage 4 chronic kidney disease, or unspecified chronic kidney disease: Secondary | ICD-10-CM | POA: Diagnosis not present

## 2022-11-23 DIAGNOSIS — N139 Obstructive and reflux uropathy, unspecified: Secondary | ICD-10-CM | POA: Diagnosis not present

## 2022-11-23 DIAGNOSIS — R2681 Unsteadiness on feet: Secondary | ICD-10-CM | POA: Diagnosis not present

## 2022-11-23 DIAGNOSIS — M6281 Muscle weakness (generalized): Secondary | ICD-10-CM | POA: Diagnosis not present

## 2022-11-24 DIAGNOSIS — B351 Tinea unguium: Secondary | ICD-10-CM | POA: Diagnosis not present

## 2022-11-24 DIAGNOSIS — E1159 Type 2 diabetes mellitus with other circulatory complications: Secondary | ICD-10-CM | POA: Diagnosis not present

## 2022-11-30 DIAGNOSIS — I480 Paroxysmal atrial fibrillation: Secondary | ICD-10-CM | POA: Diagnosis not present

## 2022-11-30 DIAGNOSIS — I509 Heart failure, unspecified: Secondary | ICD-10-CM | POA: Diagnosis not present

## 2022-11-30 DIAGNOSIS — K219 Gastro-esophageal reflux disease without esophagitis: Secondary | ICD-10-CM | POA: Diagnosis not present

## 2022-11-30 DIAGNOSIS — G309 Alzheimer's disease, unspecified: Secondary | ICD-10-CM | POA: Diagnosis not present

## 2022-11-30 DIAGNOSIS — E1165 Type 2 diabetes mellitus with hyperglycemia: Secondary | ICD-10-CM | POA: Diagnosis not present

## 2022-12-06 DIAGNOSIS — G309 Alzheimer's disease, unspecified: Secondary | ICD-10-CM | POA: Diagnosis not present

## 2022-12-06 DIAGNOSIS — I509 Heart failure, unspecified: Secondary | ICD-10-CM | POA: Diagnosis not present

## 2022-12-06 DIAGNOSIS — E119 Type 2 diabetes mellitus without complications: Secondary | ICD-10-CM | POA: Diagnosis not present

## 2022-12-06 DIAGNOSIS — N189 Chronic kidney disease, unspecified: Secondary | ICD-10-CM | POA: Diagnosis not present

## 2022-12-06 DIAGNOSIS — R627 Adult failure to thrive: Secondary | ICD-10-CM | POA: Diagnosis not present

## 2022-12-06 DIAGNOSIS — I4891 Unspecified atrial fibrillation: Secondary | ICD-10-CM | POA: Diagnosis not present

## 2022-12-17 DIAGNOSIS — N139 Obstructive and reflux uropathy, unspecified: Secondary | ICD-10-CM | POA: Diagnosis not present

## 2022-12-17 DIAGNOSIS — I130 Hypertensive heart and chronic kidney disease with heart failure and stage 1 through stage 4 chronic kidney disease, or unspecified chronic kidney disease: Secondary | ICD-10-CM | POA: Diagnosis not present

## 2022-12-17 DIAGNOSIS — M6281 Muscle weakness (generalized): Secondary | ICD-10-CM | POA: Diagnosis not present

## 2022-12-17 DIAGNOSIS — R2681 Unsteadiness on feet: Secondary | ICD-10-CM | POA: Diagnosis not present

## 2022-12-18 DIAGNOSIS — R2681 Unsteadiness on feet: Secondary | ICD-10-CM | POA: Diagnosis not present

## 2022-12-18 DIAGNOSIS — N139 Obstructive and reflux uropathy, unspecified: Secondary | ICD-10-CM | POA: Diagnosis not present

## 2022-12-18 DIAGNOSIS — M6281 Muscle weakness (generalized): Secondary | ICD-10-CM | POA: Diagnosis not present

## 2022-12-18 DIAGNOSIS — I130 Hypertensive heart and chronic kidney disease with heart failure and stage 1 through stage 4 chronic kidney disease, or unspecified chronic kidney disease: Secondary | ICD-10-CM | POA: Diagnosis not present

## 2022-12-19 DIAGNOSIS — E119 Type 2 diabetes mellitus without complications: Secondary | ICD-10-CM | POA: Diagnosis not present

## 2022-12-19 DIAGNOSIS — D508 Other iron deficiency anemias: Secondary | ICD-10-CM | POA: Diagnosis not present

## 2022-12-19 DIAGNOSIS — E1165 Type 2 diabetes mellitus with hyperglycemia: Secondary | ICD-10-CM | POA: Diagnosis not present

## 2022-12-19 DIAGNOSIS — I130 Hypertensive heart and chronic kidney disease with heart failure and stage 1 through stage 4 chronic kidney disease, or unspecified chronic kidney disease: Secondary | ICD-10-CM | POA: Diagnosis not present

## 2022-12-19 DIAGNOSIS — R2681 Unsteadiness on feet: Secondary | ICD-10-CM | POA: Diagnosis not present

## 2022-12-19 DIAGNOSIS — M6281 Muscle weakness (generalized): Secondary | ICD-10-CM | POA: Diagnosis not present

## 2022-12-19 DIAGNOSIS — I4891 Unspecified atrial fibrillation: Secondary | ICD-10-CM | POA: Diagnosis not present

## 2022-12-19 DIAGNOSIS — G309 Alzheimer's disease, unspecified: Secondary | ICD-10-CM | POA: Diagnosis not present

## 2022-12-19 DIAGNOSIS — N139 Obstructive and reflux uropathy, unspecified: Secondary | ICD-10-CM | POA: Diagnosis not present

## 2022-12-19 DIAGNOSIS — N189 Chronic kidney disease, unspecified: Secondary | ICD-10-CM | POA: Diagnosis not present

## 2022-12-19 DIAGNOSIS — I480 Paroxysmal atrial fibrillation: Secondary | ICD-10-CM | POA: Diagnosis not present

## 2022-12-19 DIAGNOSIS — I509 Heart failure, unspecified: Secondary | ICD-10-CM | POA: Diagnosis not present

## 2022-12-20 DIAGNOSIS — I130 Hypertensive heart and chronic kidney disease with heart failure and stage 1 through stage 4 chronic kidney disease, or unspecified chronic kidney disease: Secondary | ICD-10-CM | POA: Diagnosis not present

## 2022-12-20 DIAGNOSIS — N139 Obstructive and reflux uropathy, unspecified: Secondary | ICD-10-CM | POA: Diagnosis not present

## 2022-12-20 DIAGNOSIS — R2681 Unsteadiness on feet: Secondary | ICD-10-CM | POA: Diagnosis not present

## 2022-12-20 DIAGNOSIS — M6281 Muscle weakness (generalized): Secondary | ICD-10-CM | POA: Diagnosis not present

## 2022-12-21 DIAGNOSIS — M6281 Muscle weakness (generalized): Secondary | ICD-10-CM | POA: Diagnosis not present

## 2022-12-21 DIAGNOSIS — I130 Hypertensive heart and chronic kidney disease with heart failure and stage 1 through stage 4 chronic kidney disease, or unspecified chronic kidney disease: Secondary | ICD-10-CM | POA: Diagnosis not present

## 2022-12-21 DIAGNOSIS — R2681 Unsteadiness on feet: Secondary | ICD-10-CM | POA: Diagnosis not present

## 2022-12-21 DIAGNOSIS — N139 Obstructive and reflux uropathy, unspecified: Secondary | ICD-10-CM | POA: Diagnosis not present

## 2022-12-24 DIAGNOSIS — N139 Obstructive and reflux uropathy, unspecified: Secondary | ICD-10-CM | POA: Diagnosis not present

## 2022-12-24 DIAGNOSIS — I130 Hypertensive heart and chronic kidney disease with heart failure and stage 1 through stage 4 chronic kidney disease, or unspecified chronic kidney disease: Secondary | ICD-10-CM | POA: Diagnosis not present

## 2022-12-24 DIAGNOSIS — R2681 Unsteadiness on feet: Secondary | ICD-10-CM | POA: Diagnosis not present

## 2022-12-24 DIAGNOSIS — M6281 Muscle weakness (generalized): Secondary | ICD-10-CM | POA: Diagnosis not present

## 2022-12-25 DIAGNOSIS — R2681 Unsteadiness on feet: Secondary | ICD-10-CM | POA: Diagnosis not present

## 2022-12-25 DIAGNOSIS — I130 Hypertensive heart and chronic kidney disease with heart failure and stage 1 through stage 4 chronic kidney disease, or unspecified chronic kidney disease: Secondary | ICD-10-CM | POA: Diagnosis not present

## 2022-12-25 DIAGNOSIS — N139 Obstructive and reflux uropathy, unspecified: Secondary | ICD-10-CM | POA: Diagnosis not present

## 2022-12-25 DIAGNOSIS — M6281 Muscle weakness (generalized): Secondary | ICD-10-CM | POA: Diagnosis not present

## 2022-12-26 DIAGNOSIS — R2681 Unsteadiness on feet: Secondary | ICD-10-CM | POA: Diagnosis not present

## 2022-12-26 DIAGNOSIS — M6281 Muscle weakness (generalized): Secondary | ICD-10-CM | POA: Diagnosis not present

## 2022-12-26 DIAGNOSIS — N139 Obstructive and reflux uropathy, unspecified: Secondary | ICD-10-CM | POA: Diagnosis not present

## 2022-12-26 DIAGNOSIS — I130 Hypertensive heart and chronic kidney disease with heart failure and stage 1 through stage 4 chronic kidney disease, or unspecified chronic kidney disease: Secondary | ICD-10-CM | POA: Diagnosis not present

## 2022-12-27 DIAGNOSIS — M6281 Muscle weakness (generalized): Secondary | ICD-10-CM | POA: Diagnosis not present

## 2022-12-27 DIAGNOSIS — R2681 Unsteadiness on feet: Secondary | ICD-10-CM | POA: Diagnosis not present

## 2022-12-27 DIAGNOSIS — N139 Obstructive and reflux uropathy, unspecified: Secondary | ICD-10-CM | POA: Diagnosis not present

## 2022-12-27 DIAGNOSIS — I130 Hypertensive heart and chronic kidney disease with heart failure and stage 1 through stage 4 chronic kidney disease, or unspecified chronic kidney disease: Secondary | ICD-10-CM | POA: Diagnosis not present

## 2022-12-28 DIAGNOSIS — K219 Gastro-esophageal reflux disease without esophagitis: Secondary | ICD-10-CM | POA: Diagnosis not present

## 2022-12-28 DIAGNOSIS — N139 Obstructive and reflux uropathy, unspecified: Secondary | ICD-10-CM | POA: Diagnosis not present

## 2022-12-28 DIAGNOSIS — M6281 Muscle weakness (generalized): Secondary | ICD-10-CM | POA: Diagnosis not present

## 2022-12-28 DIAGNOSIS — I130 Hypertensive heart and chronic kidney disease with heart failure and stage 1 through stage 4 chronic kidney disease, or unspecified chronic kidney disease: Secondary | ICD-10-CM | POA: Diagnosis not present

## 2022-12-28 DIAGNOSIS — E1165 Type 2 diabetes mellitus with hyperglycemia: Secondary | ICD-10-CM | POA: Diagnosis not present

## 2022-12-28 DIAGNOSIS — I4891 Unspecified atrial fibrillation: Secondary | ICD-10-CM | POA: Diagnosis not present

## 2022-12-28 DIAGNOSIS — I509 Heart failure, unspecified: Secondary | ICD-10-CM | POA: Diagnosis not present

## 2022-12-28 DIAGNOSIS — R2681 Unsteadiness on feet: Secondary | ICD-10-CM | POA: Diagnosis not present

## 2022-12-31 DIAGNOSIS — M6281 Muscle weakness (generalized): Secondary | ICD-10-CM | POA: Diagnosis not present

## 2022-12-31 DIAGNOSIS — I130 Hypertensive heart and chronic kidney disease with heart failure and stage 1 through stage 4 chronic kidney disease, or unspecified chronic kidney disease: Secondary | ICD-10-CM | POA: Diagnosis not present

## 2022-12-31 DIAGNOSIS — N139 Obstructive and reflux uropathy, unspecified: Secondary | ICD-10-CM | POA: Diagnosis not present

## 2022-12-31 DIAGNOSIS — R2681 Unsteadiness on feet: Secondary | ICD-10-CM | POA: Diagnosis not present

## 2023-01-01 DIAGNOSIS — R2681 Unsteadiness on feet: Secondary | ICD-10-CM | POA: Diagnosis not present

## 2023-01-01 DIAGNOSIS — I130 Hypertensive heart and chronic kidney disease with heart failure and stage 1 through stage 4 chronic kidney disease, or unspecified chronic kidney disease: Secondary | ICD-10-CM | POA: Diagnosis not present

## 2023-01-01 DIAGNOSIS — N139 Obstructive and reflux uropathy, unspecified: Secondary | ICD-10-CM | POA: Diagnosis not present

## 2023-01-01 DIAGNOSIS — M6281 Muscle weakness (generalized): Secondary | ICD-10-CM | POA: Diagnosis not present

## 2023-01-02 DIAGNOSIS — N139 Obstructive and reflux uropathy, unspecified: Secondary | ICD-10-CM | POA: Diagnosis not present

## 2023-01-02 DIAGNOSIS — R2681 Unsteadiness on feet: Secondary | ICD-10-CM | POA: Diagnosis not present

## 2023-01-02 DIAGNOSIS — I130 Hypertensive heart and chronic kidney disease with heart failure and stage 1 through stage 4 chronic kidney disease, or unspecified chronic kidney disease: Secondary | ICD-10-CM | POA: Diagnosis not present

## 2023-01-02 DIAGNOSIS — M6281 Muscle weakness (generalized): Secondary | ICD-10-CM | POA: Diagnosis not present

## 2023-01-03 DIAGNOSIS — N139 Obstructive and reflux uropathy, unspecified: Secondary | ICD-10-CM | POA: Diagnosis not present

## 2023-01-03 DIAGNOSIS — R2681 Unsteadiness on feet: Secondary | ICD-10-CM | POA: Diagnosis not present

## 2023-01-03 DIAGNOSIS — M6281 Muscle weakness (generalized): Secondary | ICD-10-CM | POA: Diagnosis not present

## 2023-01-03 DIAGNOSIS — I130 Hypertensive heart and chronic kidney disease with heart failure and stage 1 through stage 4 chronic kidney disease, or unspecified chronic kidney disease: Secondary | ICD-10-CM | POA: Diagnosis not present

## 2023-01-04 DIAGNOSIS — I130 Hypertensive heart and chronic kidney disease with heart failure and stage 1 through stage 4 chronic kidney disease, or unspecified chronic kidney disease: Secondary | ICD-10-CM | POA: Diagnosis not present

## 2023-01-04 DIAGNOSIS — R2681 Unsteadiness on feet: Secondary | ICD-10-CM | POA: Diagnosis not present

## 2023-01-04 DIAGNOSIS — N139 Obstructive and reflux uropathy, unspecified: Secondary | ICD-10-CM | POA: Diagnosis not present

## 2023-01-04 DIAGNOSIS — M6281 Muscle weakness (generalized): Secondary | ICD-10-CM | POA: Diagnosis not present

## 2023-01-07 DIAGNOSIS — R2681 Unsteadiness on feet: Secondary | ICD-10-CM | POA: Diagnosis not present

## 2023-01-07 DIAGNOSIS — N139 Obstructive and reflux uropathy, unspecified: Secondary | ICD-10-CM | POA: Diagnosis not present

## 2023-01-07 DIAGNOSIS — I130 Hypertensive heart and chronic kidney disease with heart failure and stage 1 through stage 4 chronic kidney disease, or unspecified chronic kidney disease: Secondary | ICD-10-CM | POA: Diagnosis not present

## 2023-01-07 DIAGNOSIS — M6281 Muscle weakness (generalized): Secondary | ICD-10-CM | POA: Diagnosis not present

## 2023-01-08 DIAGNOSIS — I130 Hypertensive heart and chronic kidney disease with heart failure and stage 1 through stage 4 chronic kidney disease, or unspecified chronic kidney disease: Secondary | ICD-10-CM | POA: Diagnosis not present

## 2023-01-08 DIAGNOSIS — M6281 Muscle weakness (generalized): Secondary | ICD-10-CM | POA: Diagnosis not present

## 2023-01-08 DIAGNOSIS — N139 Obstructive and reflux uropathy, unspecified: Secondary | ICD-10-CM | POA: Diagnosis not present

## 2023-01-08 DIAGNOSIS — R2681 Unsteadiness on feet: Secondary | ICD-10-CM | POA: Diagnosis not present

## 2023-01-09 DIAGNOSIS — M6281 Muscle weakness (generalized): Secondary | ICD-10-CM | POA: Diagnosis not present

## 2023-01-09 DIAGNOSIS — N139 Obstructive and reflux uropathy, unspecified: Secondary | ICD-10-CM | POA: Diagnosis not present

## 2023-01-09 DIAGNOSIS — I130 Hypertensive heart and chronic kidney disease with heart failure and stage 1 through stage 4 chronic kidney disease, or unspecified chronic kidney disease: Secondary | ICD-10-CM | POA: Diagnosis not present

## 2023-01-09 DIAGNOSIS — R2681 Unsteadiness on feet: Secondary | ICD-10-CM | POA: Diagnosis not present

## 2023-01-10 DIAGNOSIS — R2681 Unsteadiness on feet: Secondary | ICD-10-CM | POA: Diagnosis not present

## 2023-01-10 DIAGNOSIS — M6281 Muscle weakness (generalized): Secondary | ICD-10-CM | POA: Diagnosis not present

## 2023-01-10 DIAGNOSIS — N139 Obstructive and reflux uropathy, unspecified: Secondary | ICD-10-CM | POA: Diagnosis not present

## 2023-01-10 DIAGNOSIS — I130 Hypertensive heart and chronic kidney disease with heart failure and stage 1 through stage 4 chronic kidney disease, or unspecified chronic kidney disease: Secondary | ICD-10-CM | POA: Diagnosis not present

## 2023-01-11 DIAGNOSIS — M6281 Muscle weakness (generalized): Secondary | ICD-10-CM | POA: Diagnosis not present

## 2023-01-11 DIAGNOSIS — I130 Hypertensive heart and chronic kidney disease with heart failure and stage 1 through stage 4 chronic kidney disease, or unspecified chronic kidney disease: Secondary | ICD-10-CM | POA: Diagnosis not present

## 2023-01-11 DIAGNOSIS — N139 Obstructive and reflux uropathy, unspecified: Secondary | ICD-10-CM | POA: Diagnosis not present

## 2023-01-11 DIAGNOSIS — R2681 Unsteadiness on feet: Secondary | ICD-10-CM | POA: Diagnosis not present

## 2023-01-13 DIAGNOSIS — R2681 Unsteadiness on feet: Secondary | ICD-10-CM | POA: Diagnosis not present

## 2023-01-13 DIAGNOSIS — I130 Hypertensive heart and chronic kidney disease with heart failure and stage 1 through stage 4 chronic kidney disease, or unspecified chronic kidney disease: Secondary | ICD-10-CM | POA: Diagnosis not present

## 2023-01-13 DIAGNOSIS — N139 Obstructive and reflux uropathy, unspecified: Secondary | ICD-10-CM | POA: Diagnosis not present

## 2023-01-13 DIAGNOSIS — M6281 Muscle weakness (generalized): Secondary | ICD-10-CM | POA: Diagnosis not present

## 2023-01-15 DIAGNOSIS — I130 Hypertensive heart and chronic kidney disease with heart failure and stage 1 through stage 4 chronic kidney disease, or unspecified chronic kidney disease: Secondary | ICD-10-CM | POA: Diagnosis not present

## 2023-01-15 DIAGNOSIS — M6281 Muscle weakness (generalized): Secondary | ICD-10-CM | POA: Diagnosis not present

## 2023-01-15 DIAGNOSIS — N139 Obstructive and reflux uropathy, unspecified: Secondary | ICD-10-CM | POA: Diagnosis not present

## 2023-01-15 DIAGNOSIS — R2681 Unsteadiness on feet: Secondary | ICD-10-CM | POA: Diagnosis not present

## 2023-01-16 DIAGNOSIS — R2681 Unsteadiness on feet: Secondary | ICD-10-CM | POA: Diagnosis not present

## 2023-01-16 DIAGNOSIS — I130 Hypertensive heart and chronic kidney disease with heart failure and stage 1 through stage 4 chronic kidney disease, or unspecified chronic kidney disease: Secondary | ICD-10-CM | POA: Diagnosis not present

## 2023-01-16 DIAGNOSIS — M6281 Muscle weakness (generalized): Secondary | ICD-10-CM | POA: Diagnosis not present

## 2023-01-16 DIAGNOSIS — N139 Obstructive and reflux uropathy, unspecified: Secondary | ICD-10-CM | POA: Diagnosis not present

## 2023-01-17 DIAGNOSIS — I130 Hypertensive heart and chronic kidney disease with heart failure and stage 1 through stage 4 chronic kidney disease, or unspecified chronic kidney disease: Secondary | ICD-10-CM | POA: Diagnosis not present

## 2023-01-17 DIAGNOSIS — R2681 Unsteadiness on feet: Secondary | ICD-10-CM | POA: Diagnosis not present

## 2023-01-17 DIAGNOSIS — M6281 Muscle weakness (generalized): Secondary | ICD-10-CM | POA: Diagnosis not present

## 2023-01-17 DIAGNOSIS — N139 Obstructive and reflux uropathy, unspecified: Secondary | ICD-10-CM | POA: Diagnosis not present

## 2023-01-18 DIAGNOSIS — M6281 Muscle weakness (generalized): Secondary | ICD-10-CM | POA: Diagnosis not present

## 2023-01-18 DIAGNOSIS — I130 Hypertensive heart and chronic kidney disease with heart failure and stage 1 through stage 4 chronic kidney disease, or unspecified chronic kidney disease: Secondary | ICD-10-CM | POA: Diagnosis not present

## 2023-01-18 DIAGNOSIS — R2681 Unsteadiness on feet: Secondary | ICD-10-CM | POA: Diagnosis not present

## 2023-01-18 DIAGNOSIS — N139 Obstructive and reflux uropathy, unspecified: Secondary | ICD-10-CM | POA: Diagnosis not present

## 2023-01-21 DIAGNOSIS — E1165 Type 2 diabetes mellitus with hyperglycemia: Secondary | ICD-10-CM | POA: Diagnosis not present

## 2023-01-21 DIAGNOSIS — I480 Paroxysmal atrial fibrillation: Secondary | ICD-10-CM | POA: Diagnosis not present

## 2023-01-21 DIAGNOSIS — R918 Other nonspecific abnormal finding of lung field: Secondary | ICD-10-CM | POA: Diagnosis not present

## 2023-01-21 DIAGNOSIS — R0981 Nasal congestion: Secondary | ICD-10-CM | POA: Diagnosis not present

## 2023-01-21 DIAGNOSIS — I509 Heart failure, unspecified: Secondary | ICD-10-CM | POA: Diagnosis not present

## 2023-01-21 DIAGNOSIS — E119 Type 2 diabetes mellitus without complications: Secondary | ICD-10-CM | POA: Diagnosis not present

## 2023-01-21 DIAGNOSIS — R062 Wheezing: Secondary | ICD-10-CM | POA: Diagnosis not present

## 2023-01-21 DIAGNOSIS — R059 Cough, unspecified: Secondary | ICD-10-CM | POA: Diagnosis not present

## 2023-01-21 DIAGNOSIS — G309 Alzheimer's disease, unspecified: Secondary | ICD-10-CM | POA: Diagnosis not present

## 2023-01-21 DIAGNOSIS — D508 Other iron deficiency anemias: Secondary | ICD-10-CM | POA: Diagnosis not present

## 2023-01-21 DIAGNOSIS — J9809 Other diseases of bronchus, not elsewhere classified: Secondary | ICD-10-CM | POA: Diagnosis not present

## 2023-01-21 DIAGNOSIS — I4891 Unspecified atrial fibrillation: Secondary | ICD-10-CM | POA: Diagnosis not present

## 2023-01-21 DIAGNOSIS — N189 Chronic kidney disease, unspecified: Secondary | ICD-10-CM | POA: Diagnosis not present

## 2023-01-24 DIAGNOSIS — E1159 Type 2 diabetes mellitus with other circulatory complications: Secondary | ICD-10-CM | POA: Diagnosis not present

## 2023-01-24 DIAGNOSIS — B351 Tinea unguium: Secondary | ICD-10-CM | POA: Diagnosis not present

## 2023-01-31 DIAGNOSIS — I480 Paroxysmal atrial fibrillation: Secondary | ICD-10-CM | POA: Diagnosis not present

## 2023-01-31 DIAGNOSIS — I509 Heart failure, unspecified: Secondary | ICD-10-CM | POA: Diagnosis not present

## 2023-01-31 DIAGNOSIS — R627 Adult failure to thrive: Secondary | ICD-10-CM | POA: Diagnosis not present

## 2023-01-31 DIAGNOSIS — N189 Chronic kidney disease, unspecified: Secondary | ICD-10-CM | POA: Diagnosis not present

## 2023-01-31 DIAGNOSIS — I4891 Unspecified atrial fibrillation: Secondary | ICD-10-CM | POA: Diagnosis not present

## 2023-01-31 DIAGNOSIS — E1165 Type 2 diabetes mellitus with hyperglycemia: Secondary | ICD-10-CM | POA: Diagnosis not present

## 2023-02-18 DIAGNOSIS — D508 Other iron deficiency anemias: Secondary | ICD-10-CM | POA: Diagnosis not present

## 2023-02-18 DIAGNOSIS — N189 Chronic kidney disease, unspecified: Secondary | ICD-10-CM | POA: Diagnosis not present

## 2023-02-18 DIAGNOSIS — E119 Type 2 diabetes mellitus without complications: Secondary | ICD-10-CM | POA: Diagnosis not present

## 2023-02-18 DIAGNOSIS — G309 Alzheimer's disease, unspecified: Secondary | ICD-10-CM | POA: Diagnosis not present

## 2023-02-18 DIAGNOSIS — E1165 Type 2 diabetes mellitus with hyperglycemia: Secondary | ICD-10-CM | POA: Diagnosis not present

## 2023-02-18 DIAGNOSIS — I4891 Unspecified atrial fibrillation: Secondary | ICD-10-CM | POA: Diagnosis not present

## 2023-02-18 DIAGNOSIS — I509 Heart failure, unspecified: Secondary | ICD-10-CM | POA: Diagnosis not present

## 2023-02-18 DIAGNOSIS — I480 Paroxysmal atrial fibrillation: Secondary | ICD-10-CM | POA: Diagnosis not present

## 2023-02-23 DIAGNOSIS — R296 Repeated falls: Secondary | ICD-10-CM | POA: Diagnosis not present

## 2023-02-27 DIAGNOSIS — E1165 Type 2 diabetes mellitus with hyperglycemia: Secondary | ICD-10-CM | POA: Diagnosis not present

## 2023-02-27 DIAGNOSIS — G309 Alzheimer's disease, unspecified: Secondary | ICD-10-CM | POA: Diagnosis not present

## 2023-02-27 DIAGNOSIS — I10 Essential (primary) hypertension: Secondary | ICD-10-CM | POA: Diagnosis not present

## 2023-02-27 DIAGNOSIS — I509 Heart failure, unspecified: Secondary | ICD-10-CM | POA: Diagnosis not present

## 2023-03-04 DIAGNOSIS — E785 Hyperlipidemia, unspecified: Secondary | ICD-10-CM | POA: Diagnosis not present

## 2023-03-04 DIAGNOSIS — G309 Alzheimer's disease, unspecified: Secondary | ICD-10-CM | POA: Diagnosis not present

## 2023-03-04 DIAGNOSIS — I4891 Unspecified atrial fibrillation: Secondary | ICD-10-CM | POA: Diagnosis not present

## 2023-03-04 DIAGNOSIS — D649 Anemia, unspecified: Secondary | ICD-10-CM | POA: Diagnosis not present

## 2023-03-04 DIAGNOSIS — K219 Gastro-esophageal reflux disease without esophagitis: Secondary | ICD-10-CM | POA: Diagnosis not present

## 2023-03-04 DIAGNOSIS — E1165 Type 2 diabetes mellitus with hyperglycemia: Secondary | ICD-10-CM | POA: Diagnosis not present

## 2023-03-04 DIAGNOSIS — I509 Heart failure, unspecified: Secondary | ICD-10-CM | POA: Diagnosis not present

## 2023-03-04 DIAGNOSIS — N189 Chronic kidney disease, unspecified: Secondary | ICD-10-CM | POA: Diagnosis not present

## 2023-03-04 DIAGNOSIS — I10 Essential (primary) hypertension: Secondary | ICD-10-CM | POA: Diagnosis not present

## 2023-03-19 DIAGNOSIS — R2681 Unsteadiness on feet: Secondary | ICD-10-CM | POA: Diagnosis not present

## 2023-03-19 DIAGNOSIS — N139 Obstructive and reflux uropathy, unspecified: Secondary | ICD-10-CM | POA: Diagnosis not present

## 2023-03-19 DIAGNOSIS — I130 Hypertensive heart and chronic kidney disease with heart failure and stage 1 through stage 4 chronic kidney disease, or unspecified chronic kidney disease: Secondary | ICD-10-CM | POA: Diagnosis not present

## 2023-03-19 DIAGNOSIS — R1310 Dysphagia, unspecified: Secondary | ICD-10-CM | POA: Diagnosis not present

## 2023-03-20 DIAGNOSIS — R2681 Unsteadiness on feet: Secondary | ICD-10-CM | POA: Diagnosis not present

## 2023-03-20 DIAGNOSIS — I130 Hypertensive heart and chronic kidney disease with heart failure and stage 1 through stage 4 chronic kidney disease, or unspecified chronic kidney disease: Secondary | ICD-10-CM | POA: Diagnosis not present

## 2023-03-20 DIAGNOSIS — N139 Obstructive and reflux uropathy, unspecified: Secondary | ICD-10-CM | POA: Diagnosis not present

## 2023-03-20 DIAGNOSIS — R1310 Dysphagia, unspecified: Secondary | ICD-10-CM | POA: Diagnosis not present

## 2023-03-21 DIAGNOSIS — E119 Type 2 diabetes mellitus without complications: Secondary | ICD-10-CM | POA: Diagnosis not present

## 2023-03-21 DIAGNOSIS — I480 Paroxysmal atrial fibrillation: Secondary | ICD-10-CM | POA: Diagnosis not present

## 2023-03-21 DIAGNOSIS — M6281 Muscle weakness (generalized): Secondary | ICD-10-CM | POA: Diagnosis not present

## 2023-03-21 DIAGNOSIS — N189 Chronic kidney disease, unspecified: Secondary | ICD-10-CM | POA: Diagnosis not present

## 2023-03-21 DIAGNOSIS — G309 Alzheimer's disease, unspecified: Secondary | ICD-10-CM | POA: Diagnosis not present

## 2023-03-21 DIAGNOSIS — I509 Heart failure, unspecified: Secondary | ICD-10-CM | POA: Diagnosis not present

## 2023-03-21 DIAGNOSIS — R2681 Unsteadiness on feet: Secondary | ICD-10-CM | POA: Diagnosis not present

## 2023-03-21 DIAGNOSIS — I4891 Unspecified atrial fibrillation: Secondary | ICD-10-CM | POA: Diagnosis not present

## 2023-03-21 DIAGNOSIS — N139 Obstructive and reflux uropathy, unspecified: Secondary | ICD-10-CM | POA: Diagnosis not present

## 2023-03-21 DIAGNOSIS — E1165 Type 2 diabetes mellitus with hyperglycemia: Secondary | ICD-10-CM | POA: Diagnosis not present

## 2023-03-21 DIAGNOSIS — D508 Other iron deficiency anemias: Secondary | ICD-10-CM | POA: Diagnosis not present

## 2023-03-21 DIAGNOSIS — I130 Hypertensive heart and chronic kidney disease with heart failure and stage 1 through stage 4 chronic kidney disease, or unspecified chronic kidney disease: Secondary | ICD-10-CM | POA: Diagnosis not present

## 2023-03-22 DIAGNOSIS — N139 Obstructive and reflux uropathy, unspecified: Secondary | ICD-10-CM | POA: Diagnosis not present

## 2023-03-22 DIAGNOSIS — M6281 Muscle weakness (generalized): Secondary | ICD-10-CM | POA: Diagnosis not present

## 2023-03-22 DIAGNOSIS — I130 Hypertensive heart and chronic kidney disease with heart failure and stage 1 through stage 4 chronic kidney disease, or unspecified chronic kidney disease: Secondary | ICD-10-CM | POA: Diagnosis not present

## 2023-03-22 DIAGNOSIS — R2681 Unsteadiness on feet: Secondary | ICD-10-CM | POA: Diagnosis not present

## 2023-03-25 DIAGNOSIS — R2681 Unsteadiness on feet: Secondary | ICD-10-CM | POA: Diagnosis not present

## 2023-03-25 DIAGNOSIS — N139 Obstructive and reflux uropathy, unspecified: Secondary | ICD-10-CM | POA: Diagnosis not present

## 2023-03-25 DIAGNOSIS — I130 Hypertensive heart and chronic kidney disease with heart failure and stage 1 through stage 4 chronic kidney disease, or unspecified chronic kidney disease: Secondary | ICD-10-CM | POA: Diagnosis not present

## 2023-03-25 DIAGNOSIS — M6281 Muscle weakness (generalized): Secondary | ICD-10-CM | POA: Diagnosis not present

## 2023-03-26 DIAGNOSIS — M6281 Muscle weakness (generalized): Secondary | ICD-10-CM | POA: Diagnosis not present

## 2023-03-26 DIAGNOSIS — E1159 Type 2 diabetes mellitus with other circulatory complications: Secondary | ICD-10-CM | POA: Diagnosis not present

## 2023-03-26 DIAGNOSIS — B351 Tinea unguium: Secondary | ICD-10-CM | POA: Diagnosis not present

## 2023-03-26 DIAGNOSIS — N139 Obstructive and reflux uropathy, unspecified: Secondary | ICD-10-CM | POA: Diagnosis not present

## 2023-03-26 DIAGNOSIS — R2681 Unsteadiness on feet: Secondary | ICD-10-CM | POA: Diagnosis not present

## 2023-03-26 DIAGNOSIS — I130 Hypertensive heart and chronic kidney disease with heart failure and stage 1 through stage 4 chronic kidney disease, or unspecified chronic kidney disease: Secondary | ICD-10-CM | POA: Diagnosis not present

## 2023-03-27 DIAGNOSIS — N139 Obstructive and reflux uropathy, unspecified: Secondary | ICD-10-CM | POA: Diagnosis not present

## 2023-03-27 DIAGNOSIS — M6281 Muscle weakness (generalized): Secondary | ICD-10-CM | POA: Diagnosis not present

## 2023-03-27 DIAGNOSIS — I130 Hypertensive heart and chronic kidney disease with heart failure and stage 1 through stage 4 chronic kidney disease, or unspecified chronic kidney disease: Secondary | ICD-10-CM | POA: Diagnosis not present

## 2023-03-27 DIAGNOSIS — R2681 Unsteadiness on feet: Secondary | ICD-10-CM | POA: Diagnosis not present

## 2023-03-28 DIAGNOSIS — I130 Hypertensive heart and chronic kidney disease with heart failure and stage 1 through stage 4 chronic kidney disease, or unspecified chronic kidney disease: Secondary | ICD-10-CM | POA: Diagnosis not present

## 2023-03-28 DIAGNOSIS — R2681 Unsteadiness on feet: Secondary | ICD-10-CM | POA: Diagnosis not present

## 2023-03-28 DIAGNOSIS — M6281 Muscle weakness (generalized): Secondary | ICD-10-CM | POA: Diagnosis not present

## 2023-03-28 DIAGNOSIS — N139 Obstructive and reflux uropathy, unspecified: Secondary | ICD-10-CM | POA: Diagnosis not present

## 2023-03-29 DIAGNOSIS — R2681 Unsteadiness on feet: Secondary | ICD-10-CM | POA: Diagnosis not present

## 2023-03-29 DIAGNOSIS — N139 Obstructive and reflux uropathy, unspecified: Secondary | ICD-10-CM | POA: Diagnosis not present

## 2023-03-29 DIAGNOSIS — M6281 Muscle weakness (generalized): Secondary | ICD-10-CM | POA: Diagnosis not present

## 2023-03-29 DIAGNOSIS — I130 Hypertensive heart and chronic kidney disease with heart failure and stage 1 through stage 4 chronic kidney disease, or unspecified chronic kidney disease: Secondary | ICD-10-CM | POA: Diagnosis not present

## 2023-04-01 DIAGNOSIS — I130 Hypertensive heart and chronic kidney disease with heart failure and stage 1 through stage 4 chronic kidney disease, or unspecified chronic kidney disease: Secondary | ICD-10-CM | POA: Diagnosis not present

## 2023-04-01 DIAGNOSIS — M6281 Muscle weakness (generalized): Secondary | ICD-10-CM | POA: Diagnosis not present

## 2023-04-01 DIAGNOSIS — N139 Obstructive and reflux uropathy, unspecified: Secondary | ICD-10-CM | POA: Diagnosis not present

## 2023-04-01 DIAGNOSIS — R2681 Unsteadiness on feet: Secondary | ICD-10-CM | POA: Diagnosis not present

## 2023-04-02 DIAGNOSIS — R2681 Unsteadiness on feet: Secondary | ICD-10-CM | POA: Diagnosis not present

## 2023-04-02 DIAGNOSIS — N139 Obstructive and reflux uropathy, unspecified: Secondary | ICD-10-CM | POA: Diagnosis not present

## 2023-04-02 DIAGNOSIS — I130 Hypertensive heart and chronic kidney disease with heart failure and stage 1 through stage 4 chronic kidney disease, or unspecified chronic kidney disease: Secondary | ICD-10-CM | POA: Diagnosis not present

## 2023-04-02 DIAGNOSIS — M6281 Muscle weakness (generalized): Secondary | ICD-10-CM | POA: Diagnosis not present

## 2023-04-03 DIAGNOSIS — I130 Hypertensive heart and chronic kidney disease with heart failure and stage 1 through stage 4 chronic kidney disease, or unspecified chronic kidney disease: Secondary | ICD-10-CM | POA: Diagnosis not present

## 2023-04-03 DIAGNOSIS — N139 Obstructive and reflux uropathy, unspecified: Secondary | ICD-10-CM | POA: Diagnosis not present

## 2023-04-03 DIAGNOSIS — R2681 Unsteadiness on feet: Secondary | ICD-10-CM | POA: Diagnosis not present

## 2023-04-03 DIAGNOSIS — M6281 Muscle weakness (generalized): Secondary | ICD-10-CM | POA: Diagnosis not present

## 2023-04-04 DIAGNOSIS — N139 Obstructive and reflux uropathy, unspecified: Secondary | ICD-10-CM | POA: Diagnosis not present

## 2023-04-04 DIAGNOSIS — R2681 Unsteadiness on feet: Secondary | ICD-10-CM | POA: Diagnosis not present

## 2023-04-04 DIAGNOSIS — M6281 Muscle weakness (generalized): Secondary | ICD-10-CM | POA: Diagnosis not present

## 2023-04-04 DIAGNOSIS — I130 Hypertensive heart and chronic kidney disease with heart failure and stage 1 through stage 4 chronic kidney disease, or unspecified chronic kidney disease: Secondary | ICD-10-CM | POA: Diagnosis not present

## 2023-04-05 DIAGNOSIS — N139 Obstructive and reflux uropathy, unspecified: Secondary | ICD-10-CM | POA: Diagnosis not present

## 2023-04-05 DIAGNOSIS — M6281 Muscle weakness (generalized): Secondary | ICD-10-CM | POA: Diagnosis not present

## 2023-04-05 DIAGNOSIS — I130 Hypertensive heart and chronic kidney disease with heart failure and stage 1 through stage 4 chronic kidney disease, or unspecified chronic kidney disease: Secondary | ICD-10-CM | POA: Diagnosis not present

## 2023-04-05 DIAGNOSIS — R2681 Unsteadiness on feet: Secondary | ICD-10-CM | POA: Diagnosis not present

## 2023-04-11 DIAGNOSIS — N189 Chronic kidney disease, unspecified: Secondary | ICD-10-CM | POA: Diagnosis not present

## 2023-04-11 DIAGNOSIS — K219 Gastro-esophageal reflux disease without esophagitis: Secondary | ICD-10-CM | POA: Diagnosis not present

## 2023-04-11 DIAGNOSIS — I4891 Unspecified atrial fibrillation: Secondary | ICD-10-CM | POA: Diagnosis not present

## 2023-04-11 DIAGNOSIS — I509 Heart failure, unspecified: Secondary | ICD-10-CM | POA: Diagnosis not present

## 2023-04-22 DIAGNOSIS — E1165 Type 2 diabetes mellitus with hyperglycemia: Secondary | ICD-10-CM | POA: Diagnosis not present

## 2023-04-22 DIAGNOSIS — D508 Other iron deficiency anemias: Secondary | ICD-10-CM | POA: Diagnosis not present

## 2023-04-22 DIAGNOSIS — I509 Heart failure, unspecified: Secondary | ICD-10-CM | POA: Diagnosis not present

## 2023-04-22 DIAGNOSIS — N189 Chronic kidney disease, unspecified: Secondary | ICD-10-CM | POA: Diagnosis not present

## 2023-04-22 DIAGNOSIS — I480 Paroxysmal atrial fibrillation: Secondary | ICD-10-CM | POA: Diagnosis not present

## 2023-04-22 DIAGNOSIS — E119 Type 2 diabetes mellitus without complications: Secondary | ICD-10-CM | POA: Diagnosis not present

## 2023-04-22 DIAGNOSIS — G309 Alzheimer's disease, unspecified: Secondary | ICD-10-CM | POA: Diagnosis not present

## 2023-04-22 DIAGNOSIS — I4891 Unspecified atrial fibrillation: Secondary | ICD-10-CM | POA: Diagnosis not present

## 2023-05-10 DIAGNOSIS — I509 Heart failure, unspecified: Secondary | ICD-10-CM | POA: Diagnosis not present

## 2023-05-10 DIAGNOSIS — G309 Alzheimer's disease, unspecified: Secondary | ICD-10-CM | POA: Diagnosis not present

## 2023-05-10 DIAGNOSIS — K219 Gastro-esophageal reflux disease without esophagitis: Secondary | ICD-10-CM | POA: Diagnosis not present

## 2023-05-10 DIAGNOSIS — N189 Chronic kidney disease, unspecified: Secondary | ICD-10-CM | POA: Diagnosis not present

## 2023-05-10 DIAGNOSIS — I4891 Unspecified atrial fibrillation: Secondary | ICD-10-CM | POA: Diagnosis not present

## 2023-05-21 DIAGNOSIS — I4891 Unspecified atrial fibrillation: Secondary | ICD-10-CM | POA: Diagnosis not present

## 2023-05-21 DIAGNOSIS — I509 Heart failure, unspecified: Secondary | ICD-10-CM | POA: Diagnosis not present

## 2023-05-21 DIAGNOSIS — E1165 Type 2 diabetes mellitus with hyperglycemia: Secondary | ICD-10-CM | POA: Diagnosis not present

## 2023-05-21 DIAGNOSIS — E119 Type 2 diabetes mellitus without complications: Secondary | ICD-10-CM | POA: Diagnosis not present

## 2023-05-21 DIAGNOSIS — I480 Paroxysmal atrial fibrillation: Secondary | ICD-10-CM | POA: Diagnosis not present

## 2023-05-21 DIAGNOSIS — G309 Alzheimer's disease, unspecified: Secondary | ICD-10-CM | POA: Diagnosis not present

## 2023-05-21 DIAGNOSIS — N189 Chronic kidney disease, unspecified: Secondary | ICD-10-CM | POA: Diagnosis not present

## 2023-05-21 DIAGNOSIS — D508 Other iron deficiency anemias: Secondary | ICD-10-CM | POA: Diagnosis not present

## 2023-06-10 DIAGNOSIS — I4891 Unspecified atrial fibrillation: Secondary | ICD-10-CM | POA: Diagnosis not present

## 2023-06-10 DIAGNOSIS — G309 Alzheimer's disease, unspecified: Secondary | ICD-10-CM | POA: Diagnosis not present

## 2023-06-10 DIAGNOSIS — I509 Heart failure, unspecified: Secondary | ICD-10-CM | POA: Diagnosis not present

## 2023-06-10 DIAGNOSIS — N189 Chronic kidney disease, unspecified: Secondary | ICD-10-CM | POA: Diagnosis not present

## 2023-06-10 DIAGNOSIS — I10 Essential (primary) hypertension: Secondary | ICD-10-CM | POA: Diagnosis not present

## 2023-06-17 DIAGNOSIS — N139 Obstructive and reflux uropathy, unspecified: Secondary | ICD-10-CM | POA: Diagnosis not present

## 2023-06-17 DIAGNOSIS — M6281 Muscle weakness (generalized): Secondary | ICD-10-CM | POA: Diagnosis not present

## 2023-06-17 DIAGNOSIS — I130 Hypertensive heart and chronic kidney disease with heart failure and stage 1 through stage 4 chronic kidney disease, or unspecified chronic kidney disease: Secondary | ICD-10-CM | POA: Diagnosis not present

## 2023-06-17 DIAGNOSIS — R1310 Dysphagia, unspecified: Secondary | ICD-10-CM | POA: Diagnosis not present

## 2023-06-17 DIAGNOSIS — R2681 Unsteadiness on feet: Secondary | ICD-10-CM | POA: Diagnosis not present

## 2023-06-18 DIAGNOSIS — N139 Obstructive and reflux uropathy, unspecified: Secondary | ICD-10-CM | POA: Diagnosis not present

## 2023-06-18 DIAGNOSIS — M6281 Muscle weakness (generalized): Secondary | ICD-10-CM | POA: Diagnosis not present

## 2023-06-18 DIAGNOSIS — R2681 Unsteadiness on feet: Secondary | ICD-10-CM | POA: Diagnosis not present

## 2023-06-18 DIAGNOSIS — R1310 Dysphagia, unspecified: Secondary | ICD-10-CM | POA: Diagnosis not present

## 2023-06-18 DIAGNOSIS — I130 Hypertensive heart and chronic kidney disease with heart failure and stage 1 through stage 4 chronic kidney disease, or unspecified chronic kidney disease: Secondary | ICD-10-CM | POA: Diagnosis not present

## 2023-06-19 DIAGNOSIS — N139 Obstructive and reflux uropathy, unspecified: Secondary | ICD-10-CM | POA: Diagnosis not present

## 2023-06-19 DIAGNOSIS — E1165 Type 2 diabetes mellitus with hyperglycemia: Secondary | ICD-10-CM | POA: Diagnosis not present

## 2023-06-19 DIAGNOSIS — M6281 Muscle weakness (generalized): Secondary | ICD-10-CM | POA: Diagnosis not present

## 2023-06-19 DIAGNOSIS — D649 Anemia, unspecified: Secondary | ICD-10-CM | POA: Diagnosis not present

## 2023-06-19 DIAGNOSIS — I509 Heart failure, unspecified: Secondary | ICD-10-CM | POA: Diagnosis not present

## 2023-06-19 DIAGNOSIS — N189 Chronic kidney disease, unspecified: Secondary | ICD-10-CM | POA: Diagnosis not present

## 2023-06-19 DIAGNOSIS — K219 Gastro-esophageal reflux disease without esophagitis: Secondary | ICD-10-CM | POA: Diagnosis not present

## 2023-06-19 DIAGNOSIS — I10 Essential (primary) hypertension: Secondary | ICD-10-CM | POA: Diagnosis not present

## 2023-06-19 DIAGNOSIS — R1310 Dysphagia, unspecified: Secondary | ICD-10-CM | POA: Diagnosis not present

## 2023-06-19 DIAGNOSIS — R2681 Unsteadiness on feet: Secondary | ICD-10-CM | POA: Diagnosis not present

## 2023-06-19 DIAGNOSIS — I4891 Unspecified atrial fibrillation: Secondary | ICD-10-CM | POA: Diagnosis not present

## 2023-06-19 DIAGNOSIS — I130 Hypertensive heart and chronic kidney disease with heart failure and stage 1 through stage 4 chronic kidney disease, or unspecified chronic kidney disease: Secondary | ICD-10-CM | POA: Diagnosis not present

## 2023-06-19 DIAGNOSIS — G309 Alzheimer's disease, unspecified: Secondary | ICD-10-CM | POA: Diagnosis not present

## 2023-06-21 DIAGNOSIS — G309 Alzheimer's disease, unspecified: Secondary | ICD-10-CM | POA: Diagnosis not present

## 2023-06-21 DIAGNOSIS — I130 Hypertensive heart and chronic kidney disease with heart failure and stage 1 through stage 4 chronic kidney disease, or unspecified chronic kidney disease: Secondary | ICD-10-CM | POA: Diagnosis not present

## 2023-06-21 DIAGNOSIS — E1165 Type 2 diabetes mellitus with hyperglycemia: Secondary | ICD-10-CM | POA: Diagnosis not present

## 2023-06-21 DIAGNOSIS — N139 Obstructive and reflux uropathy, unspecified: Secondary | ICD-10-CM | POA: Diagnosis not present

## 2023-06-21 DIAGNOSIS — I480 Paroxysmal atrial fibrillation: Secondary | ICD-10-CM | POA: Diagnosis not present

## 2023-06-21 DIAGNOSIS — R2681 Unsteadiness on feet: Secondary | ICD-10-CM | POA: Diagnosis not present

## 2023-06-21 DIAGNOSIS — M6281 Muscle weakness (generalized): Secondary | ICD-10-CM | POA: Diagnosis not present

## 2023-06-21 DIAGNOSIS — N189 Chronic kidney disease, unspecified: Secondary | ICD-10-CM | POA: Diagnosis not present

## 2023-06-21 DIAGNOSIS — D508 Other iron deficiency anemias: Secondary | ICD-10-CM | POA: Diagnosis not present

## 2023-06-21 DIAGNOSIS — E119 Type 2 diabetes mellitus without complications: Secondary | ICD-10-CM | POA: Diagnosis not present

## 2023-06-21 DIAGNOSIS — I4891 Unspecified atrial fibrillation: Secondary | ICD-10-CM | POA: Diagnosis not present

## 2023-06-21 DIAGNOSIS — I509 Heart failure, unspecified: Secondary | ICD-10-CM | POA: Diagnosis not present

## 2023-06-22 DIAGNOSIS — R2681 Unsteadiness on feet: Secondary | ICD-10-CM | POA: Diagnosis not present

## 2023-06-22 DIAGNOSIS — N139 Obstructive and reflux uropathy, unspecified: Secondary | ICD-10-CM | POA: Diagnosis not present

## 2023-06-22 DIAGNOSIS — I130 Hypertensive heart and chronic kidney disease with heart failure and stage 1 through stage 4 chronic kidney disease, or unspecified chronic kidney disease: Secondary | ICD-10-CM | POA: Diagnosis not present

## 2023-06-22 DIAGNOSIS — M6281 Muscle weakness (generalized): Secondary | ICD-10-CM | POA: Diagnosis not present

## 2023-06-24 DIAGNOSIS — N139 Obstructive and reflux uropathy, unspecified: Secondary | ICD-10-CM | POA: Diagnosis not present

## 2023-06-24 DIAGNOSIS — M6281 Muscle weakness (generalized): Secondary | ICD-10-CM | POA: Diagnosis not present

## 2023-06-24 DIAGNOSIS — R2681 Unsteadiness on feet: Secondary | ICD-10-CM | POA: Diagnosis not present

## 2023-06-24 DIAGNOSIS — I130 Hypertensive heart and chronic kidney disease with heart failure and stage 1 through stage 4 chronic kidney disease, or unspecified chronic kidney disease: Secondary | ICD-10-CM | POA: Diagnosis not present

## 2023-06-25 DIAGNOSIS — R2681 Unsteadiness on feet: Secondary | ICD-10-CM | POA: Diagnosis not present

## 2023-06-25 DIAGNOSIS — I130 Hypertensive heart and chronic kidney disease with heart failure and stage 1 through stage 4 chronic kidney disease, or unspecified chronic kidney disease: Secondary | ICD-10-CM | POA: Diagnosis not present

## 2023-06-25 DIAGNOSIS — N139 Obstructive and reflux uropathy, unspecified: Secondary | ICD-10-CM | POA: Diagnosis not present

## 2023-06-25 DIAGNOSIS — M6281 Muscle weakness (generalized): Secondary | ICD-10-CM | POA: Diagnosis not present

## 2023-06-26 DIAGNOSIS — I130 Hypertensive heart and chronic kidney disease with heart failure and stage 1 through stage 4 chronic kidney disease, or unspecified chronic kidney disease: Secondary | ICD-10-CM | POA: Diagnosis not present

## 2023-06-26 DIAGNOSIS — N139 Obstructive and reflux uropathy, unspecified: Secondary | ICD-10-CM | POA: Diagnosis not present

## 2023-06-26 DIAGNOSIS — R2681 Unsteadiness on feet: Secondary | ICD-10-CM | POA: Diagnosis not present

## 2023-06-26 DIAGNOSIS — M6281 Muscle weakness (generalized): Secondary | ICD-10-CM | POA: Diagnosis not present

## 2023-07-09 DIAGNOSIS — I4891 Unspecified atrial fibrillation: Secondary | ICD-10-CM | POA: Diagnosis not present

## 2023-07-09 DIAGNOSIS — K219 Gastro-esophageal reflux disease without esophagitis: Secondary | ICD-10-CM | POA: Diagnosis not present

## 2023-07-09 DIAGNOSIS — G309 Alzheimer's disease, unspecified: Secondary | ICD-10-CM | POA: Diagnosis not present

## 2023-07-09 DIAGNOSIS — I509 Heart failure, unspecified: Secondary | ICD-10-CM | POA: Diagnosis not present

## 2023-07-09 DIAGNOSIS — N189 Chronic kidney disease, unspecified: Secondary | ICD-10-CM | POA: Diagnosis not present

## 2023-07-19 DIAGNOSIS — K219 Gastro-esophageal reflux disease without esophagitis: Secondary | ICD-10-CM | POA: Diagnosis not present

## 2023-07-19 DIAGNOSIS — N189 Chronic kidney disease, unspecified: Secondary | ICD-10-CM | POA: Diagnosis not present

## 2023-07-19 DIAGNOSIS — I10 Essential (primary) hypertension: Secondary | ICD-10-CM | POA: Diagnosis not present

## 2023-07-19 DIAGNOSIS — I509 Heart failure, unspecified: Secondary | ICD-10-CM | POA: Diagnosis not present

## 2023-07-19 DIAGNOSIS — E119 Type 2 diabetes mellitus without complications: Secondary | ICD-10-CM | POA: Diagnosis not present

## 2023-07-19 DIAGNOSIS — E785 Hyperlipidemia, unspecified: Secondary | ICD-10-CM | POA: Diagnosis not present

## 2023-07-19 DIAGNOSIS — D649 Anemia, unspecified: Secondary | ICD-10-CM | POA: Diagnosis not present

## 2023-07-19 DIAGNOSIS — G309 Alzheimer's disease, unspecified: Secondary | ICD-10-CM | POA: Diagnosis not present

## 2023-07-19 DIAGNOSIS — I4891 Unspecified atrial fibrillation: Secondary | ICD-10-CM | POA: Diagnosis not present

## 2023-07-22 DIAGNOSIS — N189 Chronic kidney disease, unspecified: Secondary | ICD-10-CM | POA: Diagnosis not present

## 2023-07-22 DIAGNOSIS — D508 Other iron deficiency anemias: Secondary | ICD-10-CM | POA: Diagnosis not present

## 2023-07-22 DIAGNOSIS — G309 Alzheimer's disease, unspecified: Secondary | ICD-10-CM | POA: Diagnosis not present

## 2023-07-22 DIAGNOSIS — I509 Heart failure, unspecified: Secondary | ICD-10-CM | POA: Diagnosis not present

## 2023-07-22 DIAGNOSIS — I480 Paroxysmal atrial fibrillation: Secondary | ICD-10-CM | POA: Diagnosis not present

## 2023-07-22 DIAGNOSIS — E1165 Type 2 diabetes mellitus with hyperglycemia: Secondary | ICD-10-CM | POA: Diagnosis not present

## 2023-07-22 DIAGNOSIS — E119 Type 2 diabetes mellitus without complications: Secondary | ICD-10-CM | POA: Diagnosis not present

## 2023-07-22 DIAGNOSIS — I4891 Unspecified atrial fibrillation: Secondary | ICD-10-CM | POA: Diagnosis not present

## 2023-08-16 DIAGNOSIS — G309 Alzheimer's disease, unspecified: Secondary | ICD-10-CM | POA: Diagnosis not present

## 2023-08-16 DIAGNOSIS — E785 Hyperlipidemia, unspecified: Secondary | ICD-10-CM | POA: Diagnosis not present

## 2023-08-16 DIAGNOSIS — N189 Chronic kidney disease, unspecified: Secondary | ICD-10-CM | POA: Diagnosis not present

## 2023-08-16 DIAGNOSIS — E119 Type 2 diabetes mellitus without complications: Secondary | ICD-10-CM | POA: Diagnosis not present

## 2023-08-16 DIAGNOSIS — I4891 Unspecified atrial fibrillation: Secondary | ICD-10-CM | POA: Diagnosis not present

## 2023-08-16 DIAGNOSIS — I10 Essential (primary) hypertension: Secondary | ICD-10-CM | POA: Diagnosis not present

## 2023-08-16 DIAGNOSIS — I509 Heart failure, unspecified: Secondary | ICD-10-CM | POA: Diagnosis not present

## 2023-08-20 DIAGNOSIS — N189 Chronic kidney disease, unspecified: Secondary | ICD-10-CM | POA: Diagnosis not present

## 2023-08-20 DIAGNOSIS — I10 Essential (primary) hypertension: Secondary | ICD-10-CM | POA: Diagnosis not present

## 2023-08-20 DIAGNOSIS — R627 Adult failure to thrive: Secondary | ICD-10-CM | POA: Diagnosis not present

## 2023-08-20 DIAGNOSIS — I509 Heart failure, unspecified: Secondary | ICD-10-CM | POA: Diagnosis not present

## 2023-09-03 DIAGNOSIS — R509 Fever, unspecified: Secondary | ICD-10-CM | POA: Diagnosis not present

## 2023-09-17 DIAGNOSIS — B351 Tinea unguium: Secondary | ICD-10-CM | POA: Diagnosis not present

## 2023-09-17 DIAGNOSIS — R1312 Dysphagia, oropharyngeal phase: Secondary | ICD-10-CM | POA: Diagnosis not present

## 2023-09-17 DIAGNOSIS — E1159 Type 2 diabetes mellitus with other circulatory complications: Secondary | ICD-10-CM | POA: Diagnosis not present

## 2023-09-18 DIAGNOSIS — R1312 Dysphagia, oropharyngeal phase: Secondary | ICD-10-CM | POA: Diagnosis not present

## 2023-09-19 DIAGNOSIS — R1312 Dysphagia, oropharyngeal phase: Secondary | ICD-10-CM | POA: Diagnosis not present

## 2023-09-20 DIAGNOSIS — R1312 Dysphagia, oropharyngeal phase: Secondary | ICD-10-CM | POA: Diagnosis not present

## 2023-09-23 DIAGNOSIS — N139 Obstructive and reflux uropathy, unspecified: Secondary | ICD-10-CM | POA: Diagnosis not present

## 2023-09-23 DIAGNOSIS — R1312 Dysphagia, oropharyngeal phase: Secondary | ICD-10-CM | POA: Diagnosis not present

## 2023-09-24 DIAGNOSIS — N139 Obstructive and reflux uropathy, unspecified: Secondary | ICD-10-CM | POA: Diagnosis not present

## 2023-09-24 DIAGNOSIS — R1312 Dysphagia, oropharyngeal phase: Secondary | ICD-10-CM | POA: Diagnosis not present

## 2023-09-25 DIAGNOSIS — R1312 Dysphagia, oropharyngeal phase: Secondary | ICD-10-CM | POA: Diagnosis not present

## 2023-09-25 DIAGNOSIS — N139 Obstructive and reflux uropathy, unspecified: Secondary | ICD-10-CM | POA: Diagnosis not present

## 2023-09-26 DIAGNOSIS — N139 Obstructive and reflux uropathy, unspecified: Secondary | ICD-10-CM | POA: Diagnosis not present

## 2023-09-26 DIAGNOSIS — R1312 Dysphagia, oropharyngeal phase: Secondary | ICD-10-CM | POA: Diagnosis not present

## 2023-09-29 DIAGNOSIS — N139 Obstructive and reflux uropathy, unspecified: Secondary | ICD-10-CM | POA: Diagnosis not present

## 2023-09-29 DIAGNOSIS — R1312 Dysphagia, oropharyngeal phase: Secondary | ICD-10-CM | POA: Diagnosis not present

## 2023-10-01 DIAGNOSIS — N139 Obstructive and reflux uropathy, unspecified: Secondary | ICD-10-CM | POA: Diagnosis not present

## 2023-10-01 DIAGNOSIS — R1312 Dysphagia, oropharyngeal phase: Secondary | ICD-10-CM | POA: Diagnosis not present

## 2023-10-02 DIAGNOSIS — N139 Obstructive and reflux uropathy, unspecified: Secondary | ICD-10-CM | POA: Diagnosis not present

## 2023-10-02 DIAGNOSIS — R1312 Dysphagia, oropharyngeal phase: Secondary | ICD-10-CM | POA: Diagnosis not present

## 2023-10-03 DIAGNOSIS — N139 Obstructive and reflux uropathy, unspecified: Secondary | ICD-10-CM | POA: Diagnosis not present

## 2023-10-03 DIAGNOSIS — R1312 Dysphagia, oropharyngeal phase: Secondary | ICD-10-CM | POA: Diagnosis not present

## 2023-10-07 DIAGNOSIS — R627 Adult failure to thrive: Secondary | ICD-10-CM | POA: Diagnosis not present

## 2023-10-07 DIAGNOSIS — G309 Alzheimer's disease, unspecified: Secondary | ICD-10-CM | POA: Diagnosis not present

## 2023-10-07 DIAGNOSIS — E119 Type 2 diabetes mellitus without complications: Secondary | ICD-10-CM | POA: Diagnosis not present

## 2023-10-07 DIAGNOSIS — N139 Obstructive and reflux uropathy, unspecified: Secondary | ICD-10-CM | POA: Diagnosis not present

## 2023-10-07 DIAGNOSIS — I509 Heart failure, unspecified: Secondary | ICD-10-CM | POA: Diagnosis not present

## 2023-10-07 DIAGNOSIS — R1312 Dysphagia, oropharyngeal phase: Secondary | ICD-10-CM | POA: Diagnosis not present

## 2023-10-07 DIAGNOSIS — N189 Chronic kidney disease, unspecified: Secondary | ICD-10-CM | POA: Diagnosis not present

## 2023-10-07 DIAGNOSIS — K219 Gastro-esophageal reflux disease without esophagitis: Secondary | ICD-10-CM | POA: Diagnosis not present

## 2023-10-08 DIAGNOSIS — R1312 Dysphagia, oropharyngeal phase: Secondary | ICD-10-CM | POA: Diagnosis not present

## 2023-10-08 DIAGNOSIS — N139 Obstructive and reflux uropathy, unspecified: Secondary | ICD-10-CM | POA: Diagnosis not present

## 2023-10-09 DIAGNOSIS — N139 Obstructive and reflux uropathy, unspecified: Secondary | ICD-10-CM | POA: Diagnosis not present

## 2023-10-09 DIAGNOSIS — R1312 Dysphagia, oropharyngeal phase: Secondary | ICD-10-CM | POA: Diagnosis not present

## 2023-10-11 DIAGNOSIS — N139 Obstructive and reflux uropathy, unspecified: Secondary | ICD-10-CM | POA: Diagnosis not present

## 2023-10-11 DIAGNOSIS — R1312 Dysphagia, oropharyngeal phase: Secondary | ICD-10-CM | POA: Diagnosis not present

## 2023-11-01 DIAGNOSIS — H401134 Primary open-angle glaucoma, bilateral, indeterminate stage: Secondary | ICD-10-CM | POA: Diagnosis not present

## 2023-11-29 DIAGNOSIS — N139 Obstructive and reflux uropathy, unspecified: Secondary | ICD-10-CM | POA: Diagnosis not present

## 2023-11-29 DIAGNOSIS — R627 Adult failure to thrive: Secondary | ICD-10-CM | POA: Diagnosis not present

## 2023-12-16 DIAGNOSIS — R293 Abnormal posture: Secondary | ICD-10-CM | POA: Diagnosis not present

## 2023-12-17 DIAGNOSIS — M25762 Osteophyte, left knee: Secondary | ICD-10-CM | POA: Diagnosis not present

## 2023-12-17 DIAGNOSIS — M1712 Unilateral primary osteoarthritis, left knee: Secondary | ICD-10-CM | POA: Diagnosis not present

## 2023-12-18 DIAGNOSIS — R293 Abnormal posture: Secondary | ICD-10-CM | POA: Diagnosis not present

## 2023-12-19 DIAGNOSIS — M24562 Contracture, left knee: Secondary | ICD-10-CM | POA: Diagnosis not present

## 2023-12-19 DIAGNOSIS — M6281 Muscle weakness (generalized): Secondary | ICD-10-CM | POA: Diagnosis not present

## 2023-12-19 DIAGNOSIS — R293 Abnormal posture: Secondary | ICD-10-CM | POA: Diagnosis not present

## 2023-12-20 DIAGNOSIS — M6281 Muscle weakness (generalized): Secondary | ICD-10-CM | POA: Diagnosis not present

## 2023-12-20 DIAGNOSIS — R293 Abnormal posture: Secondary | ICD-10-CM | POA: Diagnosis not present

## 2023-12-20 DIAGNOSIS — M24562 Contracture, left knee: Secondary | ICD-10-CM | POA: Diagnosis not present

## 2023-12-23 DIAGNOSIS — M6281 Muscle weakness (generalized): Secondary | ICD-10-CM | POA: Diagnosis not present

## 2023-12-23 DIAGNOSIS — M24562 Contracture, left knee: Secondary | ICD-10-CM | POA: Diagnosis not present

## 2023-12-23 DIAGNOSIS — R293 Abnormal posture: Secondary | ICD-10-CM | POA: Diagnosis not present

## 2023-12-24 DIAGNOSIS — E1159 Type 2 diabetes mellitus with other circulatory complications: Secondary | ICD-10-CM | POA: Diagnosis not present

## 2023-12-24 DIAGNOSIS — M6281 Muscle weakness (generalized): Secondary | ICD-10-CM | POA: Diagnosis not present

## 2023-12-24 DIAGNOSIS — B351 Tinea unguium: Secondary | ICD-10-CM | POA: Diagnosis not present

## 2023-12-24 DIAGNOSIS — M24562 Contracture, left knee: Secondary | ICD-10-CM | POA: Diagnosis not present

## 2023-12-24 DIAGNOSIS — R293 Abnormal posture: Secondary | ICD-10-CM | POA: Diagnosis not present

## 2023-12-25 DIAGNOSIS — M24562 Contracture, left knee: Secondary | ICD-10-CM | POA: Diagnosis not present

## 2023-12-25 DIAGNOSIS — M6281 Muscle weakness (generalized): Secondary | ICD-10-CM | POA: Diagnosis not present

## 2023-12-25 DIAGNOSIS — R293 Abnormal posture: Secondary | ICD-10-CM | POA: Diagnosis not present

## 2024-02-03 DIAGNOSIS — N189 Chronic kidney disease, unspecified: Secondary | ICD-10-CM | POA: Diagnosis not present

## 2024-02-03 DIAGNOSIS — K219 Gastro-esophageal reflux disease without esophagitis: Secondary | ICD-10-CM | POA: Diagnosis not present

## 2024-02-03 DIAGNOSIS — I509 Heart failure, unspecified: Secondary | ICD-10-CM | POA: Diagnosis not present

## 2024-02-03 DIAGNOSIS — Z515 Encounter for palliative care: Secondary | ICD-10-CM | POA: Diagnosis not present

## 2024-02-03 DIAGNOSIS — G309 Alzheimer's disease, unspecified: Secondary | ICD-10-CM | POA: Diagnosis not present

## 2024-03-04 DIAGNOSIS — E1159 Type 2 diabetes mellitus with other circulatory complications: Secondary | ICD-10-CM | POA: Diagnosis not present

## 2024-03-04 DIAGNOSIS — B351 Tinea unguium: Secondary | ICD-10-CM | POA: Diagnosis not present

## 2024-03-31 DIAGNOSIS — K219 Gastro-esophageal reflux disease without esophagitis: Secondary | ICD-10-CM | POA: Diagnosis not present

## 2024-03-31 DIAGNOSIS — G309 Alzheimer's disease, unspecified: Secondary | ICD-10-CM | POA: Diagnosis not present

## 2024-03-31 DIAGNOSIS — Z515 Encounter for palliative care: Secondary | ICD-10-CM | POA: Diagnosis not present

## 2024-03-31 DIAGNOSIS — I509 Heart failure, unspecified: Secondary | ICD-10-CM | POA: Diagnosis not present

## 2024-03-31 DIAGNOSIS — E119 Type 2 diabetes mellitus without complications: Secondary | ICD-10-CM | POA: Diagnosis not present

## 2024-03-31 DIAGNOSIS — N189 Chronic kidney disease, unspecified: Secondary | ICD-10-CM | POA: Diagnosis not present

## 2024-04-13 DIAGNOSIS — G309 Alzheimer's disease, unspecified: Secondary | ICD-10-CM | POA: Diagnosis not present

## 2024-04-13 DIAGNOSIS — Z515 Encounter for palliative care: Secondary | ICD-10-CM | POA: Diagnosis not present

## 2024-04-30 DIAGNOSIS — H401134 Primary open-angle glaucoma, bilateral, indeterminate stage: Secondary | ICD-10-CM | POA: Diagnosis not present

## 2024-05-11 DIAGNOSIS — Z515 Encounter for palliative care: Secondary | ICD-10-CM | POA: Diagnosis not present

## 2024-05-11 DIAGNOSIS — G309 Alzheimer's disease, unspecified: Secondary | ICD-10-CM | POA: Diagnosis not present

## 2024-05-15 DIAGNOSIS — Z515 Encounter for palliative care: Secondary | ICD-10-CM | POA: Diagnosis not present

## 2024-06-20 DEATH — deceased
